# Patient Record
Sex: Female | Born: 1965 | Race: White | Hispanic: No | Smoking: Former smoker
Health system: Southern US, Community
[De-identification: ages and names within clinical notes are randomized; demographics above are authoritative.]

## PROBLEM LIST (undated history)

## (undated) DIAGNOSIS — B001 Herpesviral vesicular dermatitis: Secondary | ICD-10-CM

## (undated) DIAGNOSIS — Z973 Presence of spectacles and contact lenses: Secondary | ICD-10-CM

## (undated) DIAGNOSIS — R188 Other ascites: Secondary | ICD-10-CM

## (undated) DIAGNOSIS — K56609 Unspecified intestinal obstruction, unspecified as to partial versus complete obstruction: Secondary | ICD-10-CM

## (undated) DIAGNOSIS — M1990 Unspecified osteoarthritis, unspecified site: Secondary | ICD-10-CM

## (undated) DIAGNOSIS — R12 Heartburn: Secondary | ICD-10-CM

## (undated) DIAGNOSIS — T7840XA Allergy, unspecified, initial encounter: Secondary | ICD-10-CM

## (undated) DIAGNOSIS — R112 Nausea with vomiting, unspecified: Secondary | ICD-10-CM

## (undated) DIAGNOSIS — Z9889 Other specified postprocedural states: Secondary | ICD-10-CM

## (undated) DIAGNOSIS — R51 Headache: Secondary | ICD-10-CM

## (undated) DIAGNOSIS — R519 Headache, unspecified: Secondary | ICD-10-CM

## (undated) DIAGNOSIS — F419 Anxiety disorder, unspecified: Secondary | ICD-10-CM

## (undated) DIAGNOSIS — K219 Gastro-esophageal reflux disease without esophagitis: Secondary | ICD-10-CM

## (undated) DIAGNOSIS — IMO0002 Reserved for concepts with insufficient information to code with codable children: Secondary | ICD-10-CM

## (undated) HISTORY — PX: KNEE ARTHROSCOPY: SHX127

## (undated) HISTORY — PX: TENDON REPAIR: SHX5111

## (undated) HISTORY — PX: MEDIAL PARTIAL KNEE REPLACEMENT: SHX5965

## (undated) HISTORY — PX: FOOT SURGERY: SHX648

## (undated) HISTORY — PX: REPLACEMENT TOTAL KNEE: SUR1224

## (undated) HISTORY — PX: WISDOM TOOTH EXTRACTION: SHX21

---

## 1999-03-24 ENCOUNTER — Other Ambulatory Visit: Admission: RE | Admit: 1999-03-24 | Discharge: 1999-03-24 | Payer: Self-pay | Admitting: *Deleted

## 2000-04-19 ENCOUNTER — Other Ambulatory Visit: Admission: RE | Admit: 2000-04-19 | Discharge: 2000-04-19 | Payer: Self-pay | Admitting: *Deleted

## 2001-01-19 ENCOUNTER — Other Ambulatory Visit: Admission: RE | Admit: 2001-01-19 | Discharge: 2001-01-19 | Payer: Self-pay | Admitting: *Deleted

## 2001-07-06 ENCOUNTER — Ambulatory Visit (HOSPITAL_COMMUNITY): Admission: RE | Admit: 2001-07-06 | Discharge: 2001-07-06 | Payer: Self-pay | Admitting: *Deleted

## 2001-08-17 ENCOUNTER — Inpatient Hospital Stay (HOSPITAL_COMMUNITY): Admission: AD | Admit: 2001-08-17 | Discharge: 2001-08-19 | Payer: Self-pay | Admitting: *Deleted

## 2001-10-02 ENCOUNTER — Other Ambulatory Visit: Admission: RE | Admit: 2001-10-02 | Discharge: 2001-10-02 | Payer: Self-pay | Admitting: *Deleted

## 2002-10-12 ENCOUNTER — Other Ambulatory Visit: Admission: RE | Admit: 2002-10-12 | Discharge: 2002-10-12 | Payer: Self-pay | Admitting: *Deleted

## 2003-02-19 ENCOUNTER — Emergency Department (HOSPITAL_COMMUNITY): Admission: EM | Admit: 2003-02-19 | Discharge: 2003-02-19 | Payer: Self-pay | Admitting: Emergency Medicine

## 2003-02-19 ENCOUNTER — Encounter: Payer: Self-pay | Admitting: Emergency Medicine

## 2003-03-30 ENCOUNTER — Ambulatory Visit (HOSPITAL_COMMUNITY): Admission: RE | Admit: 2003-03-30 | Discharge: 2003-03-30 | Payer: Self-pay | Admitting: *Deleted

## 2003-03-30 ENCOUNTER — Encounter: Payer: Self-pay | Admitting: *Deleted

## 2003-04-03 ENCOUNTER — Ambulatory Visit (HOSPITAL_COMMUNITY): Admission: RE | Admit: 2003-04-03 | Discharge: 2003-04-03 | Payer: Self-pay | Admitting: Internal Medicine

## 2003-04-03 ENCOUNTER — Encounter: Payer: Self-pay | Admitting: Specialist

## 2003-10-18 ENCOUNTER — Other Ambulatory Visit: Admission: RE | Admit: 2003-10-18 | Discharge: 2003-10-18 | Payer: Self-pay | Admitting: *Deleted

## 2003-12-20 ENCOUNTER — Encounter (INDEPENDENT_AMBULATORY_CARE_PROVIDER_SITE_OTHER): Payer: Self-pay | Admitting: *Deleted

## 2003-12-20 ENCOUNTER — Ambulatory Visit (HOSPITAL_COMMUNITY): Admission: RE | Admit: 2003-12-20 | Discharge: 2003-12-20 | Payer: Self-pay | Admitting: *Deleted

## 2004-03-02 ENCOUNTER — Ambulatory Visit (HOSPITAL_COMMUNITY): Admission: RE | Admit: 2004-03-02 | Discharge: 2004-03-02 | Payer: Self-pay | Admitting: Family Medicine

## 2004-05-08 ENCOUNTER — Other Ambulatory Visit: Admission: RE | Admit: 2004-05-08 | Discharge: 2004-05-08 | Payer: Self-pay | Admitting: Obstetrics and Gynecology

## 2004-06-20 ENCOUNTER — Emergency Department (HOSPITAL_COMMUNITY): Admission: EM | Admit: 2004-06-20 | Discharge: 2004-06-20 | Payer: Self-pay | Admitting: *Deleted

## 2004-12-30 ENCOUNTER — Other Ambulatory Visit: Admission: RE | Admit: 2004-12-30 | Discharge: 2004-12-30 | Payer: Self-pay | Admitting: Obstetrics and Gynecology

## 2005-07-16 ENCOUNTER — Other Ambulatory Visit: Admission: RE | Admit: 2005-07-16 | Discharge: 2005-07-16 | Payer: Self-pay | Admitting: Obstetrics and Gynecology

## 2009-05-20 ENCOUNTER — Ambulatory Visit (HOSPITAL_COMMUNITY): Payer: Self-pay | Admitting: Licensed Clinical Social Worker

## 2009-05-28 ENCOUNTER — Ambulatory Visit (HOSPITAL_COMMUNITY): Payer: Self-pay | Admitting: Licensed Clinical Social Worker

## 2009-06-06 ENCOUNTER — Ambulatory Visit (HOSPITAL_COMMUNITY): Payer: Self-pay | Admitting: Licensed Clinical Social Worker

## 2009-06-13 ENCOUNTER — Ambulatory Visit (HOSPITAL_COMMUNITY): Payer: Self-pay | Admitting: Licensed Clinical Social Worker

## 2010-11-21 ENCOUNTER — Encounter: Payer: Self-pay | Admitting: Family Medicine

## 2011-03-19 NOTE — H&P (Signed)
NAME:  Molly Beck, Molly Beck                      ACCOUNT NO.:  [id number]   MEDICAL RECORD NO.:  [id number]                   PATIENT TYPE:  AMB   LOCATION:  SDC                                  FACILITY:  [facility]   PHYSICIAN:  Tracie Harrier, M.D.              DATE OF BIRTH:  [birth year]   DATE OF ADMISSION:  DATE OF DISCHARGE:                                HISTORY & PHYSICAL   HISTORY OF PRESENT ILLNESS:  Molly Beck is a 45 year old female gravida 2  para 2 (twin delivery x1).  She is admitted at this time to undergo LEEP  procedure.  The patient underwent colposcopy recently in the office which  was quite difficult just physically.  The patient had a Pap smear recently  showing high-grade SIL.  She underwent colposcopy which was difficult due to  redundant vaginal tissue and the large area of the cervix.  For this she  will be admitted to undergo LEEP procedure under anesthesia for comfort  reasons.   She is on oral contraception for birth control measures.   MEDICAL HISTORY:  None.   SURGICAL HISTORY:  Knee surgery x1.   OBSTETRICAL HISTORY:  Normal spontaneous vaginal delivery x2 at term; one  pregnancy was a twin gestation.   CURRENT MEDICATIONS:  1. Ortho-Novum 7/7/7.  2. Zoloft.  3. Nasonex p.r.n.  4. Clarinex p.r.n.  5. Prilosec p.r.n.   ALLERGIES:  CODEINE.   PHYSICAL EXAMINATION:  VITAL SIGNS:  Stable, blood pressure 114/80.  GENERAL:  She is a well-developed, well-nourished female in no acute  distress.  HEENT:  Within normal limits.  NECK:  Supple without adenopathy or thyromegaly.  HEART:  Regular rate and rhythm without murmur, gallop, or rub.  LUNGS:  Clear to auscultation.  BREAST:  Exam done recently in the office was normal; this is deferred upon  admission.  ABDOMEN:  Soft and benign without masses, tenderness, hernia, or  organomegaly.  EXTREMITIES AND NEUROLOGIC:  Grossly normal.  PELVIC:  Normal external female genitalia, vagina and cervix are  clear to  the naked eye.  Redundant vaginal tissue is noted laterally.  The cervix is  parous.  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  The pelvic exam is otherwise unremarkable.   ADMITTING DIAGNOSIS:  High-grade squamous intraepithelial lesion.   PLAN:  LEEP procedure under anesthesia.   DISCUSSION:  The risks and benefits of this surgery explained to the  patient.  The main reason I am doing this in the hospital is for patient  comfort reasons.  Also, the parous nature of the cervix and the extending  area of acetowhite epithelium on the ectocervix.  Also, retraction might be  necessary to visualize the cervix safely.  The risks and benefits as well as  need for this procedure reviewed with the patient.  Tracie Harrier, M.D.    REG/MEDQ  D:  12/19/2003  T:  12/19/2003  Job:  [id number]

## 2011-03-19 NOTE — Op Note (Signed)
NAME:  Molly Beck, BELLEVUE                      ACCOUNT NO.:  [id number]   MEDICAL RECORD NO.:  [id number]                   PATIENT TYPE:  AMB   LOCATION:  SDC                                  FACILITY:  [facility]   PHYSICIAN:  Tracie Harrier, M.D.              DATE OF BIRTH:  1966-10-10   DATE OF PROCEDURE:  12/20/2003  DATE OF DISCHARGE:                                 OPERATIVE REPORT   PREOPERATIVE DIAGNOSIS:  High-grade squamous intraepithelial lesion.   POSTOPERATIVE DIAGNOSIS:  High-grade squamous intraepithelial lesion.   PROCEDURE:  Loop electrosurgical excision procedure.   SURGEON:  Tracie Harrier, M.D.   ANESTHESIA:  General.   ESTIMATED BLOOD LOSS:  Less than 20 mL.   COMPLICATIONS:  None.   FINDINGS:  At time of LEEP procedure, ectocervical extension of the cervical  dysplasia was noted.  A LEEP procedure was performed, which was probably  noted with positive margins.  The ectocervix was thoroughly cauterized with  the bipolar cautery, however.   DESCRIPTION OF PROCEDURE:  The patient was taken to the operating room,  where a general anesthetic was administered.  The patient was placed on the  operating table in the dorsal lithotomy position, a speculum was placed, and  the anterior lip of the cervix was grasped with a single-tooth tenaculum.  A  shallow LEEP was then performed in the standard fashion.  This was not very  deep.  This was fragmented, however, because of the size of the cervix.  The  cervical dysplasia was noted to be extending out onto the ectocervix.  Therefore, I am certain that the LEEP margin will be positive.  The  ectocervix was then thoroughly cauterized with a ball cautery extensively.  This included the LEEP bed and the ectocervix extensively.  The LEEP bed and  cervix were then coated with Monsel's solution.  All vaginal instruments  were removed.  The patient was awakened and taken to the recovery room in  good condition.  There were no  perioperative complications.                                               Tracie Harrier, M.D.    REG/MEDQ  D:  12/20/2003  T:  12/20/2003  Job:  [id number]

## 2012-07-21 ENCOUNTER — Ambulatory Visit (HOSPITAL_COMMUNITY)
Admission: RE | Admit: 2012-07-21 | Discharge: 2012-07-21 | Disposition: A | Discharge status: Home / Self Care | Payer: PRIVATE HEALTH INSURANCE | Source: Ambulatory Visit | Attending: Physician Assistant | Admitting: Physician Assistant

## 2012-07-21 DIAGNOSIS — M79606 Pain in leg, unspecified: Secondary | ICD-10-CM

## 2012-07-21 DIAGNOSIS — M7989 Other specified soft tissue disorders: Secondary | ICD-10-CM

## 2012-07-21 DIAGNOSIS — M79609 Pain in unspecified limb: Secondary | ICD-10-CM

## 2012-07-21 NOTE — Progress Notes (Signed)
Right:  No evidence of DVT, superficial thrombosis, or Baker's cyst.  Left:  Negative for DVT in the common femoral vein.  

## 2015-09-12 ENCOUNTER — Emergency Department (HOSPITAL_BASED_OUTPATIENT_CLINIC_OR_DEPARTMENT_OTHER)
Admission: EM | Admit: 2015-09-12 | Discharge: 2015-09-12 | Disposition: A | Discharge status: Home / Self Care | Payer: PRIVATE HEALTH INSURANCE | Attending: Emergency Medicine | Admitting: Emergency Medicine

## 2015-09-12 ENCOUNTER — Encounter (HOSPITAL_BASED_OUTPATIENT_CLINIC_OR_DEPARTMENT_OTHER): Payer: Self-pay | Admitting: Emergency Medicine

## 2015-09-12 DIAGNOSIS — R109 Unspecified abdominal pain: Secondary | ICD-10-CM | POA: Insufficient documentation

## 2015-09-12 DIAGNOSIS — Z87891 Personal history of nicotine dependence: Secondary | ICD-10-CM | POA: Diagnosis not present

## 2015-09-12 DIAGNOSIS — R000 Tachycardia, unspecified: Secondary | ICD-10-CM | POA: Diagnosis not present

## 2015-09-12 DIAGNOSIS — R197 Diarrhea, unspecified: Secondary | ICD-10-CM | POA: Insufficient documentation

## 2015-09-12 LAB — URINE MICROSCOPIC-ADD ON

## 2015-09-12 LAB — COMPREHENSIVE METABOLIC PANEL
ALK PHOS: 62 U/L (ref 38–126)
ALT: 45 U/L (ref 14–54)
AST: 35 U/L (ref 15–41)
Albumin: 3.9 g/dL (ref 3.5–5.0)
Anion gap: 7 (ref 5–15)
BILIRUBIN TOTAL: 0.7 mg/dL (ref 0.3–1.2)
BUN: 18 mg/dL (ref 6–20)
CALCIUM: 9.4 mg/dL (ref 8.9–10.3)
CO2: 25 mmol/L (ref 22–32)
CREATININE: 0.45 mg/dL (ref 0.44–1.00)
Chloride: 106 mmol/L (ref 101–111)
GFR calc Af Amer: >60 mL/min (ref >60)
GFR calc non Af Amer: >60 mL/min (ref >60)
GLUCOSE: 112 mg/dL — AB (ref 65–99)
Potassium: 3.9 mmol/L (ref 3.5–5.1)
SODIUM: 138 mmol/L (ref 135–145)
Total Protein: 6.9 g/dL (ref 6.5–8.1)

## 2015-09-12 LAB — CBC
HCT: 38 % (ref 36.0–46.0)
Hemoglobin: 13 g/dL (ref 12.0–15.0)
MCH: 31.3 pg (ref 26.0–34.0)
MCHC: 34.2 g/dL (ref 30.0–36.0)
MCV: 91.6 fL (ref 78.0–100.0)
PLATELETS: 267 10*3/uL (ref 150–400)
RBC: 4.15 MIL/uL (ref 3.87–5.11)
RDW: 12 % (ref 11.5–15.5)
WBC: 9.7 10*3/uL (ref 4.0–10.5)

## 2015-09-12 LAB — URINALYSIS, ROUTINE W REFLEX MICROSCOPIC
BILIRUBIN URINE: NEGATIVE
GLUCOSE, UA: NEGATIVE mg/dL
KETONES UR: 40 mg/dL — AB
Leukocytes, UA: NEGATIVE
Nitrite: NEGATIVE
PROTEIN: NEGATIVE mg/dL
Specific Gravity, Urine: 1.018 (ref 1.005–1.030)
Urobilinogen, UA: 0.2 mg/dL (ref 0.0–1.0)
pH: 6.5 (ref 5.0–8.0)

## 2015-09-12 LAB — LIPASE, BLOOD: Lipase: 25 U/L (ref 11–51)

## 2015-09-12 MED ORDER — HYDROMORPHONE HCL 1 MG/ML IJ SOLN
0.5000 mg | Freq: Once | INTRAMUSCULAR | Status: AC
  Administered 2015-09-12: 0.5 mg via INTRAVENOUS
  Filled 2015-09-12: qty 1

## 2015-09-12 MED ORDER — MORPHINE SULFATE (PF) 4 MG/ML IV SOLN
4.0000 mg | Freq: Once | INTRAVENOUS | Status: AC
  Administered 2015-09-12: 4 mg via INTRAVENOUS
  Filled 2015-09-12: qty 1

## 2015-09-12 MED ORDER — DICYCLOMINE HCL 10 MG PO CAPS: 20.0000 mg | ORAL_CAPSULE | Freq: Four times a day (QID) | ORAL | Status: DC | PRN

## 2015-09-12 MED ORDER — DICYCLOMINE HCL 10 MG PO CAPS
10.0000 mg | ORAL_CAPSULE | Freq: Once | ORAL | Status: AC
  Administered 2015-09-12: 10 mg via ORAL
  Filled 2015-09-12: qty 1

## 2015-09-12 MED ORDER — SODIUM CHLORIDE 0.9 % IV BOLUS (SEPSIS)
1000.0000 mL | Freq: Once | INTRAVENOUS | Status: AC
  Administered 2015-09-12: 1000 mL via INTRAVENOUS

## 2015-09-12 NOTE — ED Notes (Signed)
Per [facility]  NOV 3th keflex 500mg , , OCt 8 amoxil 875mg 

## 2015-09-12 NOTE — ED Provider Notes (Signed)
CSN: [id number]     Arrival date & time 09/12/15  1556 History   First MD Initiated Contact with Patient 09/12/15 1617     Chief Complaint  Patient presents with  . Abdominal Cramping  . Diarrhea     (Consider location/radiation/quality/duration/timing/severity/associated sxs/prior Treatment) HPI   Pulse 109, temperature 98.3 F (36.8 C), temperature source Oral, resp. rate 18, height 5\' 4"  (1.626 m), weight 160 lb (72.576 kg), SpO2 100 %.  Delle ReiningCarol T Bisceglia is a 49 y.o. female complaining of acute onset of multiple episodes of diarrhea last night after patient ate a chicken taco salad from a local [employer]. She was the only one who ate this meal, no other family members are affected. Patient states that the diarrhea turned bloody this morning. She denies fever, chills, vomiting. She does report emesis. She states that before she has an episode of diarrhea but she has a left-sided abdominal cramping which she rates at 10 out of 10. No pain medication taken prior to arrival. Of note. Patient had right knee replacement one week ago. She was on an unknown antibiotic which she finished 5 days ago. Denies pain to knee. She was written a prescription for Dilaudid and 5 mg oxycodone but she hasn't been taking it.   Antibiotics that the patient was on was both Keflex and amoxicillin 875 mg.    History reviewed. No pertinent past medical history. Past Surgical History  Procedure Laterality Date  . Replacement total knee     History reviewed. No pertinent family history. Social History  Substance Use Topics  . Smoking status: Former Games developermoker  . Smokeless tobacco: None  . Alcohol Use: Yes   OB History    No data available     Review of Systems  10 systems reviewed and found to be negative, except as noted in the HPI.   Allergies  Review of patient's allergies indicates no known allergies.  Home Medications   Prior to Admission medications   Medication Sig Start Date  End Date Taking? Authorizing Provider  dicyclomine (BENTYL) 10 MG capsule Take 2 capsules (20 mg total) by mouth 4 (four) times daily as needed for spasms. 09/12/15   Torien Ramroop, [name]   BP 145/84 mmHg  Pulse 79  Temp(Src) 98.3 F (36.8 C) (Oral)  Resp 18  Ht 5\' 4"  (1.626 m)  Wt 160 lb (72.576 kg)  BMI 27.45 kg/m2  SpO2 100% Physical Exam  Constitutional: She is oriented to person, place, and time. She appears well-developed and well-nourished. No distress.  HENT:  Head: Normocephalic.  Mildly dry mucous membranes  Eyes: Conjunctivae and EOM are normal.  Cardiovascular: Regular rhythm.   Mild tachycardia  Pulmonary/Chest: Effort normal and breath sounds normal. No stridor. No respiratory distress. She has no wheezes. She has no rales. She exhibits no tenderness.  Abdominal: Soft. She exhibits no distension and no mass. There is no tenderness. There is no rebound and no guarding.  Musculoskeletal: Normal range of motion.  Dressing in place to right knee with no significant tenderness, swelling or erythema  Neurological: She is alert and oriented to person, place, and time.  Psychiatric: She has a normal mood and affect.  Nursing note and vitals reviewed.   ED Course  Procedures (including critical care time) Labs Review Labs Reviewed  COMPREHENSIVE METABOLIC PANEL - Abnormal; Notable for the following:    Glucose, Bld 112 (*)    All other components within normal limits  URINALYSIS, ROUTINE W REFLEX MICROSCOPIC (  NOT AT [facility]) - Abnormal; Notable for the following:    APPearance CLOUDY (*)    Hgb urine dipstick MODERATE (*)    Ketones, ur 40 (*)    All other components within normal limits  URINE MICROSCOPIC-ADD ON - Abnormal; Notable for the following:    Squamous Epithelial / LPF FEW (*)    Bacteria, UA MANY (*)    All other components within normal limits  C DIFFICILE QUICK SCREEN W PCR REFLEX  LIPASE, BLOOD  CBC  GI PATHOGEN PANEL BY PCR, STOOL    Imaging  Review No results found. I have personally reviewed and evaluated these images and lab results as part of my medical decision-making.   EKG Interpretation None      MDM   Final diagnoses:  Diarrhea, unspecified type    Filed Vitals:   09/12/15 1602 09/12/15 1802 09/12/15 1929  BP:  148/84 145/84  Pulse: 109 71 79  Temp: 98.3 F (36.8 C)    TempSrc: Oral    Resp: Height:  (1.626 m)    Weight: 160 lb (72.576 kg)    SpO2: 100% 100% 100%    Medications  sodium chloride 0.9 % bolus 1,000 mL (0 mLs Intravenous Stopped 09/12/15 1802)  morphine 4 MG/ML injection 4 mg (4 mg Intravenous Given 09/12/15 1635)  dicyclomine (BENTYL) capsule 10 mg (10 mg Oral Given 09/12/15 1635)  HYDROmorphone (DILAUDID) injection 0.5 mg (0.5 mg Intravenous Given 09/12/15 1810)    CRESENCIA ASMUS is 49 y.o. female presenting with multiple episodes of diarrhea after patient ate a chicken taco salad last night. Patient has diffuse crampy abdominal pain directly before she has a bowel movement. Abdominal exam is nonsurgical. Patient with normal vital signs.   Work reassuring with no leukocytosis or electrolyte abnormalities. Urinalysis with moderate amount of hemoglobin think this is likely just secondary to slight dehydration. Patient has no signs of urinary tract infection although there are many bacteria, no indication for treating asymptomatic bacteriuria.  Patient is able to produce a small amount of stool for [place] to send for testing. I've advised this patient that her primary care physician will have to follow-up the results of this testing. We have had an extensive discussion on aggressive hydration and return to ED precautions.  Evaluation does not show pathology that would require ongoing emergent intervention or inpatient treatment. Pt is hemodynamically stable and mentating appropriately. Discussed findings and plan with patient/guardian, who agrees with care plan. All questions  answered. Return precautions discussed and outpatient follow up given.   New Prescriptions   DICYCLOMINE (BENTYL) 10 MG CAPSULE    Take 2 capsules (20 mg total) by mouth 4 (four) times daily as needed for spasms.         Wynetta Emery, [name] 09/12/15 1945  Margarita Grizzle, MD 09/12/15 [phone]

## 2015-09-12 NOTE — ED Notes (Signed)
Pt given bed pan and specimen cup to take home  Will get stool sample and bring to er

## 2015-09-12 NOTE — Discharge Instructions (Signed)
Using take one 5 mg oxycodone every 4 hours for pain, if this is not enough you can take two 5 mg oxycodone 6 hours for pain.  Your primary care physician will have to follow-up the results of the stool testing. Please check in with them in the next 72 hours.  Do not hesitate to return to the emergency room for any new, worsening or concerning symptoms.   Diarrhea Diarrhea is frequent loose and watery bowel movements. It can cause you to feel weak and dehydrated. Dehydration can cause you to become tired and thirsty, have a dry mouth, and have decreased urination that often is dark yellow. Diarrhea is a sign of another problem, most often an infection that will not last long. In most cases, diarrhea typically lasts 2-3 days. However, it can last longer if it is a sign of something more serious. It is important to treat your diarrhea as directed by your caregiver to lessen or prevent future episodes of diarrhea. CAUSES  Some common causes include:  Gastrointestinal infections caused by viruses, bacteria, or parasites.  Food poisoning or food allergies.  Certain medicines, such as antibiotics, chemotherapy, and laxatives.  Artificial sweeteners and fructose.  Digestive disorders. HOME CARE INSTRUCTIONS  Ensure adequate fluid intake (hydration): Have 1 cup (8 oz) of fluid for each diarrhea episode. Avoid fluids that contain simple sugars or sports drinks, fruit juices, whole milk products, and sodas. Your urine should be clear or pale yellow if you are drinking enough fluids. Hydrate with an oral rehydration solution that you can purchase at pharmacies, retail stores, and online. You can prepare an oral rehydration solution at home by mixing the following ingredients together:   - tsp table salt.   tsp baking soda.   tsp salt substitute containing potassium chloride.  1  tablespoons sugar.  1 L (34 oz) of water.  Certain foods and beverages may increase the speed at which food moves  through the gastrointestinal (GI) tract. These foods and beverages should be avoided and include:  Caffeinated and alcoholic beverages.  High-fiber foods, such as raw fruits and vegetables, nuts, seeds, and whole grain breads and cereals.  Foods and beverages sweetened with sugar alcohols, such as xylitol, sorbitol, and mannitol.  Some foods may be well tolerated and may help thicken stool including:  Starchy foods, such as rice, toast, pasta, low-sugar cereal, oatmeal, grits, baked potatoes, crackers, and bagels.  Bananas.  Applesauce.  Add probiotic-rich foods to help increase healthy bacteria in the GI tract, such as yogurt and fermented milk products.  Wash your hands well after each diarrhea episode.  Only take over-the-counter or prescription medicines as directed by your caregiver.  Take a warm bath to relieve any burning or pain from frequent diarrhea episodes. SEEK IMMEDIATE MEDICAL CARE IF:   You are unable to keep fluids down.  You have persistent vomiting.  You have blood in your stool, or your stools are black and tarry.  You do not urinate in 6-8 hours, or there is only a small amount of very dark urine.  You have abdominal pain that increases or localizes.  You have weakness, dizziness, confusion, or light-headedness.  You have a severe headache.  Your diarrhea gets worse or does not get better.  You have a fever or persistent symptoms for more than 2-3 days.  You have a fever and your symptoms suddenly get worse. MAKE SURE YOU:   Understand these instructions.  Will watch your condition.  Will get help right  away if you are not doing well or get worse.   This information is not intended to replace advice given to you by your health care provider. Make sure you discuss any questions you have with your health care provider.   Document Released: 10/08/2002 Document Revised: 11/08/2014 Document Reviewed: 06/25/2012 Elsevier Interactive Patient  Education [employer]

## 2015-09-12 NOTE — ED Notes (Signed)
Pt in c/o abdominal cramping and some bright red diarrhea onset last night after dinner, thinks she's sick from the food. No emesis. A+O, interactive and in NAD.

## 2015-09-16 ENCOUNTER — Telehealth (HOSPITAL_COMMUNITY): Payer: Self-pay

## 2015-09-16 LAB — GI PATHOGEN PANEL BY PCR, STOOL
C difficile toxin A/B: NOT DETECTED
Campylobacter by PCR: NOT DETECTED
Cryptosporidium by PCR: NOT DETECTED
E COLI (ETEC) LT/ST: NOT DETECTED
E COLI (STEC): NOT DETECTED
E COLI 0157 BY PCR: NOT DETECTED
G LAMBLIA BY PCR: NOT DETECTED
Norovirus GI/GII: NOT DETECTED
Rotavirus A by PCR: NOT DETECTED
SHIGELLA BY PCR: NOT DETECTED
Salmonella by PCR: NOT DETECTED

## 2017-05-30 ENCOUNTER — Encounter (HOSPITAL_COMMUNITY): Payer: Self-pay | Admitting: *Deleted

## 2017-05-30 ENCOUNTER — Ambulatory Visit: Payer: Self-pay | Admitting: Orthopedic Surgery

## 2017-05-30 NOTE — Progress Notes (Signed)
Pt denies SOB, chest pain, and being under the care of a cardiologist. Pt denies having a stress test, echo and cardiac cath. Pt denies having an EKG and chest x ray within the last year. Pt denies recent labs. Pt made aware to stop taking  Aspirin, vitamins, fish oil and herbal medications. Do not take any NSAIDs ie: Ibuprofen, Advil, Naproxen (Aleve), Motrin, BC and Goody Powder or any medication containing Aspirin. Pt verbalized understanding of all pre-op instructions.  

## 2017-05-31 ENCOUNTER — Encounter (HOSPITAL_COMMUNITY)
Admission: RE | Disposition: A | Discharge status: Home / Self Care | Payer: Self-pay | Source: Ambulatory Visit | Attending: Orthopedic Surgery

## 2017-05-31 ENCOUNTER — Ambulatory Visit (HOSPITAL_COMMUNITY)
Admission: RE | Admit: 2017-05-31 | Discharge: 2017-05-31 | Disposition: A | Discharge status: Home / Self Care | Payer: PRIVATE HEALTH INSURANCE | Source: Ambulatory Visit | Attending: Orthopedic Surgery | Admitting: Orthopedic Surgery

## 2017-05-31 ENCOUNTER — Encounter (HOSPITAL_COMMUNITY): Payer: Self-pay | Admitting: *Deleted

## 2017-05-31 ENCOUNTER — Ambulatory Visit (HOSPITAL_COMMUNITY): Payer: PRIVATE HEALTH INSURANCE | Admitting: Anesthesiology

## 2017-05-31 DIAGNOSIS — Y92832 Beach as the place of occurrence of the external cause: Secondary | ICD-10-CM | POA: Diagnosis not present

## 2017-05-31 DIAGNOSIS — Z87891 Personal history of nicotine dependence: Secondary | ICD-10-CM | POA: Insufficient documentation

## 2017-05-31 DIAGNOSIS — Z79899 Other long term (current) drug therapy: Secondary | ICD-10-CM | POA: Diagnosis not present

## 2017-05-31 DIAGNOSIS — K219 Gastro-esophageal reflux disease without esophagitis: Secondary | ICD-10-CM | POA: Diagnosis not present

## 2017-05-31 DIAGNOSIS — F419 Anxiety disorder, unspecified: Secondary | ICD-10-CM | POA: Diagnosis not present

## 2017-05-31 DIAGNOSIS — S66196A Other injury of flexor muscle, fascia and tendon of right little finger at wrist and hand level, initial encounter: Secondary | ICD-10-CM | POA: Diagnosis not present

## 2017-05-31 DIAGNOSIS — S66126A Laceration of flexor muscle, fascia and tendon of right little finger at wrist and hand level, initial encounter: Secondary | ICD-10-CM | POA: Insufficient documentation

## 2017-05-31 DIAGNOSIS — W260XXA Contact with knife, initial encounter: Secondary | ICD-10-CM | POA: Insufficient documentation

## 2017-05-31 HISTORY — DX: Herpesviral vesicular dermatitis: B00.1

## 2017-05-31 HISTORY — DX: Anxiety disorder, unspecified: F41.9

## 2017-05-31 HISTORY — DX: Presence of spectacles and contact lenses: Z97.3

## 2017-05-31 HISTORY — DX: Allergy, unspecified, initial encounter: T78.40XA

## 2017-05-31 HISTORY — DX: Unspecified osteoarthritis, unspecified site: M19.90

## 2017-05-31 HISTORY — DX: Reserved for concepts with insufficient information to code with codable children: IMO0002

## 2017-05-31 HISTORY — PX: I & D EXTREMITY: SHX5045

## 2017-05-31 HISTORY — DX: Headache: R51

## 2017-05-31 HISTORY — DX: Gastro-esophageal reflux disease without esophagitis: K21.9

## 2017-05-31 HISTORY — DX: Headache, unspecified: R51.9

## 2017-05-31 HISTORY — DX: Heartburn: R12

## 2017-05-31 LAB — BASIC METABOLIC PANEL
ANION GAP: 12 (ref 5–15)
BUN: 15 mg/dL (ref 6–20)
CHLORIDE: 101 mmol/L (ref 101–111)
CO2: 22 mmol/L (ref 22–32)
CREATININE: 0.67 mg/dL (ref 0.44–1.00)
Calcium: 9 mg/dL (ref 8.9–10.3)
GFR calc non Af Amer: >60 mL/min (ref >60)
Glucose, Bld: 71 mg/dL (ref 65–99)
POTASSIUM: 4.6 mmol/L (ref 3.5–5.1)
SODIUM: 135 mmol/L (ref 135–145)

## 2017-05-31 LAB — I-STAT BETA HCG BLOOD, ED (NOT ORDERABLE)

## 2017-05-31 LAB — CBC
HCT: 41 % (ref 36.0–46.0)
HEMOGLOBIN: 14.1 g/dL (ref 12.0–15.0)
MCH: 31.8 pg (ref 26.0–34.0)
MCHC: 34.4 g/dL (ref 30.0–36.0)
MCV: 92.6 fL (ref 78.0–100.0)
Platelets: 251 10*3/uL (ref 150–400)
RBC: 4.43 MIL/uL (ref 3.87–5.11)
RDW: 13.2 % (ref 11.5–15.5)
WBC: 5.3 10*3/uL (ref 4.0–10.5)

## 2017-05-31 SURGERY — IRRIGATION AND DEBRIDEMENT EXTREMITY
Anesthesia: General | Laterality: Right

## 2017-05-31 MED ORDER — MIDAZOLAM HCL 5 MG/5ML IJ SOLN
INTRAMUSCULAR | Status: DC | PRN
  Administered 2017-05-31 (×2): 1 mg via INTRAVENOUS

## 2017-05-31 MED ORDER — OXYCODONE HCL 5 MG PO TABS
ORAL_TABLET | ORAL | Status: AC
  Filled 2017-05-31: qty 1

## 2017-05-31 MED ORDER — LIDOCAINE HCL (CARDIAC) 20 MG/ML IV SOLN
INTRAVENOUS | Status: DC | PRN
  Administered 2017-05-31: 60 mg via INTRAVENOUS

## 2017-05-31 MED ORDER — LIDOCAINE 2% (20 MG/ML) 5 ML SYRINGE
INTRAMUSCULAR | Status: AC
  Filled 2017-05-31: qty 5

## 2017-05-31 MED ORDER — PROPOFOL 10 MG/ML IV BOLUS
INTRAVENOUS | Status: AC
  Filled 2017-05-31: qty 20

## 2017-05-31 MED ORDER — DEXAMETHASONE SODIUM PHOSPHATE 10 MG/ML IJ SOLN
INTRAMUSCULAR | Status: AC
  Filled 2017-05-31: qty 1

## 2017-05-31 MED ORDER — DEXAMETHASONE SODIUM PHOSPHATE 10 MG/ML IJ SOLN
INTRAMUSCULAR | Status: DC | PRN
  Administered 2017-05-31: 10 mg via INTRAVENOUS

## 2017-05-31 MED ORDER — FENTANYL CITRATE (PF) 100 MCG/2ML IJ SOLN
INTRAMUSCULAR | Status: AC
  Filled 2017-05-31: qty 2

## 2017-05-31 MED ORDER — PROPOFOL 10 MG/ML IV BOLUS
INTRAVENOUS | Status: DC | PRN
  Administered 2017-05-31: 130 mg via INTRAVENOUS

## 2017-05-31 MED ORDER — ONDANSETRON HCL 4 MG/2ML IJ SOLN
INTRAMUSCULAR | Status: DC | PRN
  Administered 2017-05-31: 4 mg via INTRAVENOUS

## 2017-05-31 MED ORDER — LACTATED RINGERS IV SOLN
INTRAVENOUS | Status: DC | PRN
  Administered 2017-05-31: 20:00:00 via INTRAVENOUS

## 2017-05-31 MED ORDER — FENTANYL CITRATE (PF) 250 MCG/5ML IJ SOLN
INTRAMUSCULAR | Status: AC
  Filled 2017-05-31: qty 5

## 2017-05-31 MED ORDER — MIDAZOLAM HCL 2 MG/2ML IJ SOLN
INTRAMUSCULAR | Status: AC
  Filled 2017-05-31: qty 2

## 2017-05-31 MED ORDER — EPHEDRINE 5 MG/ML INJ
INTRAVENOUS | Status: AC
  Filled 2017-05-31: qty 10

## 2017-05-31 MED ORDER — BUPIVACAINE HCL (PF) 0.25 % IJ SOLN
INTRAMUSCULAR | Status: DC | PRN
Start: 2017-05-31 — End: 2017-05-31

## 2017-05-31 MED ORDER — FENTANYL CITRATE (PF) 100 MCG/2ML IJ SOLN
INTRAMUSCULAR | Status: DC | PRN
  Administered 2017-05-31 (×4): 25 ug via INTRAVENOUS
  Administered 2017-05-31: 50 ug via INTRAVENOUS
  Administered 2017-05-31 (×2): 25 ug via INTRAVENOUS

## 2017-05-31 MED ORDER — CEFAZOLIN SODIUM-DEXTROSE 2-4 GM/100ML-% IV SOLN
2.0000 g | INTRAVENOUS | Status: AC
  Administered 2017-05-31: 2 g via INTRAVENOUS
  Filled 2017-05-31: qty 100

## 2017-05-31 MED ORDER — OXYCODONE HCL 5 MG PO TABS
5.0000 mg | ORAL_TABLET | Freq: Once | ORAL | Status: AC | PRN
  Administered 2017-05-31: 5 mg via ORAL

## 2017-05-31 MED ORDER — EPHEDRINE SULFATE 50 MG/ML IJ SOLN
INTRAMUSCULAR | Status: DC | PRN
  Administered 2017-05-31: 5 mg via INTRAVENOUS

## 2017-05-31 MED ORDER — FENTANYL CITRATE (PF) 100 MCG/2ML IJ SOLN
25.0000 ug | INTRAMUSCULAR | Status: DC | PRN
  Administered 2017-05-31 (×2): 50 ug via INTRAVENOUS

## 2017-05-31 MED ORDER — BUPIVACAINE HCL (PF) 0.25 % IJ SOLN
INTRAMUSCULAR | Status: AC
  Filled 2017-05-31: qty 10

## 2017-05-31 MED ORDER — SODIUM CHLORIDE 0.9 % IR SOLN
Status: DC | PRN
  Administered 2017-05-31: 1000 mL

## 2017-05-31 MED ORDER — OXYCODONE HCL 5 MG/5ML PO SOLN: 5.0000 mg | Freq: Once | ORAL | Status: AC | PRN

## 2017-05-31 MED ORDER — ONDANSETRON HCL 4 MG/2ML IJ SOLN
INTRAMUSCULAR | Status: AC
  Filled 2017-05-31: qty 2

## 2017-05-31 SURGICAL SUPPLY — 47 items
BANDAGE ACE 3X5.8 VEL STRL LF (GAUZE/BANDAGES/DRESSINGS) ×2 IMPLANT
BANDAGE ACE 4X5 VEL STRL LF (GAUZE/BANDAGES/DRESSINGS) ×3 IMPLANT
BNDG CONFORM 2 STRL LF (GAUZE/BANDAGES/DRESSINGS) IMPLANT
BNDG ELASTIC 2X5.8 VLCR STR LF (GAUZE/BANDAGES/DRESSINGS) ×2 IMPLANT
BNDG GAUZE ELAST 4 BULKY (GAUZE/BANDAGES/DRESSINGS) ×5 IMPLANT
CORDS BIPOLAR (ELECTRODE) ×3 IMPLANT
CUFF TOURNIQUET SINGLE 18IN (TOURNIQUET CUFF) ×3 IMPLANT
CUFF TOURNIQUET SINGLE 24IN (TOURNIQUET CUFF) IMPLANT
DRSG ADAPTIC 3X8 NADH LF (GAUZE/BANDAGES/DRESSINGS) ×3 IMPLANT
DRSG EMULSION OIL 3X3 NADH (GAUZE/BANDAGES/DRESSINGS) ×2 IMPLANT
GAUZE SPONGE 4X4 12PLY STRL (GAUZE/BANDAGES/DRESSINGS) ×3 IMPLANT
GAUZE XEROFORM 1X8 LF (GAUZE/BANDAGES/DRESSINGS) ×3 IMPLANT
GLOVE BIOGEL M 8.0 STRL (GLOVE) ×3 IMPLANT
GLOVE SS BIOGEL STRL SZ 8 (GLOVE) ×1 IMPLANT
GLOVE SUPERSENSE BIOGEL SZ 8 (GLOVE) ×2
GOWN STRL REUS W/ TWL LRG LVL3 (GOWN DISPOSABLE) ×1 IMPLANT
GOWN STRL REUS W/ TWL XL LVL3 (GOWN DISPOSABLE) ×2 IMPLANT
GOWN STRL REUS W/TWL LRG LVL3 (GOWN DISPOSABLE) ×3
GOWN STRL REUS W/TWL XL LVL3 (GOWN DISPOSABLE) ×6
HANDPIECE INTERPULSE COAX TIP (DISPOSABLE)
KIT BASIN OR (CUSTOM PROCEDURE TRAY) ×3 IMPLANT
KIT ROOM TURNOVER OR (KITS) ×3 IMPLANT
MANIFOLD NEPTUNE II (INSTRUMENTS) ×3 IMPLANT
NDL HYPO 25GX1X1/2 BEV (NEEDLE) IMPLANT
NEEDLE HYPO 25GX1X1/2 BEV (NEEDLE) ×3 IMPLANT
NS IRRIG 1000ML POUR BTL (IV SOLUTION) ×3 IMPLANT
PACK ORTHO EXTREMITY (CUSTOM PROCEDURE TRAY) ×3 IMPLANT
PAD ARMBOARD 7.5X6 YLW CONV (MISCELLANEOUS) ×3 IMPLANT
PAD CAST 4YDX4 CTTN HI CHSV (CAST SUPPLIES) ×1 IMPLANT
PADDING CAST COTTON 4X4 STRL (CAST SUPPLIES) ×3
PASSER SUT SWANSON 36MM LOOP (INSTRUMENTS) ×2 IMPLANT
SCRUB BETADINE 4OZ XXX (MISCELLANEOUS) ×3 IMPLANT
SET HNDPC FAN SPRY TIP SCT (DISPOSABLE) IMPLANT
SOL PREP POV-IOD 4OZ 10% (MISCELLANEOUS) ×3 IMPLANT
SPLINT FIBERGLASS 3X12 (CAST SUPPLIES) ×2 IMPLANT
SPONGE LAP 4X18 X RAY DECT (DISPOSABLE) ×3 IMPLANT
STOCKINETTE TUBULAR SYNTH 4IN (CAST SUPPLIES) ×2 IMPLANT
SUT PROLENE 2 0 FS (SUTURE) ×4 IMPLANT
SUT PROLENE 4 0 PS 2 18 (SUTURE) ×2 IMPLANT
SWAB CULTURE ESWAB REG 1ML (MISCELLANEOUS) IMPLANT
SYR CONTROL 10ML LL (SYRINGE) IMPLANT
TOWEL OR 17X24 6PK STRL BLUE (TOWEL DISPOSABLE) ×3 IMPLANT
TOWEL OR 17X26 10 PK STRL BLUE (TOWEL DISPOSABLE) ×3 IMPLANT
TUBE CONNECTING 12'X1/4 (SUCTIONS) ×1
TUBE CONNECTING 12X1/4 (SUCTIONS) ×2 IMPLANT
WATER STERILE IRR 1000ML POUR (IV SOLUTION) ×3 IMPLANT
YANKAUER SUCT BULB TIP NO VENT (SUCTIONS) ×3 IMPLANT

## 2017-05-31 NOTE — Op Note (Signed)
See dictation#[id number] SP zone 2 FDP repair and FDS tenolysis right small finger Lynde Ludwig MD

## 2017-05-31 NOTE — Anesthesia Postprocedure Evaluation (Signed)
Anesthesia Post Note  Patient: Delle ReiningCarol T Whitesel  Procedure(s) Performed: Procedure(s) (LRB): Right small finger irrigation and debridement and flexor tendon reconstruction (Right)     Patient location during evaluation: PACU Anesthesia Type: General Level of consciousness: awake and alert Pain management: pain level controlled Vital Signs Assessment: post-procedure vital signs reviewed and stable Respiratory status: spontaneous breathing, nonlabored ventilation, respiratory function stable and patient connected to nasal cannula oxygen Cardiovascular status: stable Postop Assessment: no signs of nausea or vomiting Anesthetic complications: no    Last Vitals:  Vitals:   05/31/17 2205 05/31/17 2215  BP: (!) 135/93 (!) 138/96  Pulse: 84 86  Resp: (!) 9 13  Temp:  36.7 C    Last Pain:  Vitals:   05/31/17 2215  TempSrc:   PainSc: Asleep                 Chia Rock

## 2017-05-31 NOTE — Anesthesia Procedure Notes (Signed)
Procedure Name: LMA Insertion Date/Time: 05/31/2017 8:14 PM Performed by: Edmonia CaprioAUSTON, Asmara Backs M Pre-anesthesia Checklist: Patient identified, Emergency Drugs available, Suction available, Patient being monitored and Timeout performed Patient Re-evaluated:Patient Re-evaluated prior to induction Oxygen Delivery Method: Circle system utilized Preoxygenation: Pre-oxygenation with 100% oxygen Induction Type: IV induction Ventilation: Mask ventilation without difficulty LMA: LMA inserted LMA Size: 4.0 Tube type: Oral Number of attempts: 1 Placement Confirmation: positive ETCO2 and breath sounds checked- equal and bilateral Tube secured with: Tape Dental Injury: Teeth and Oropharynx as per pre-operative assessment

## 2017-05-31 NOTE — Anesthesia Preprocedure Evaluation (Signed)
Anesthesia Evaluation  Patient identified by MRN, date of birth, ID band Patient awake    Reviewed: Allergy & Precautions, NPO status , Patient's Chart, lab work & pertinent test results  History of Anesthesia Complications Negative for: history of anesthetic complications  Airway Mallampati: I  TM Distance: >3 FB Neck ROM: Full    Dental  (+) Teeth Intact   Pulmonary neg shortness of breath, neg sleep apnea, neg COPD, neg recent URI, former smoker,    breath sounds clear to auscultation       Cardiovascular negative cardio ROS   Rhythm:Regular     Neuro/Psych  Headaches, neg Seizures Anxiety    GI/Hepatic Neg liver ROS, GERD  Medicated and Controlled,  Endo/Other    Renal/GU negative Renal ROS     Musculoskeletal  (+) Arthritis ,   Abdominal   Peds  Hematology   Anesthesia Other Findings   Reproductive/Obstetrics                             Anesthesia Physical Anesthesia Plan  ASA: II  Anesthesia Plan: General   Post-op Pain Management:    Induction: Intravenous  PONV Risk Score and Plan: 3 and Ondansetron and Dexamethasone  Airway Management Planned: LMA  Additional Equipment: None  Intra-op Plan:   Post-operative Plan: Extubation in OR  Informed Consent: I have reviewed the patients History and Physical, chart, labs and discussed the procedure including the risks, benefits and alternatives for the proposed anesthesia with the patient or authorized representative who has indicated his/her understanding and acceptance.   Dental advisory given  Plan Discussed with: CRNA and Surgeon  Anesthesia Plan Comments:         Anesthesia Quick Evaluation

## 2017-05-31 NOTE — Transfer of Care (Signed)
Immediate Anesthesia Transfer of Care Note  Patient: Molly ReiningCarol T Beck  Procedure(s) Performed: Procedure(s): Right small finger irrigation and debridement and flexor tendon reconstruction (Right)  Patient Location: PACU  Anesthesia Type:General  Level of Consciousness: awake, alert  and oriented  Airway & Oxygen Therapy: Patient Spontanous Breathing and Patient connected to nasal cannula oxygen  Post-op Assessment: Report given to RN and Post -op Vital signs reviewed and stable  Post vital signs: Reviewed and stable  Last Vitals:  Vitals:   05/31/17 1617 05/31/17 2139  BP:  (!) 143/92  Pulse: 88 96  Resp: 18 16  Temp: 36.7 C (!) 36.4 C    Last Pain:  Vitals:   05/31/17 2139  TempSrc:   PainSc: 5          Complications: No apparent anesthesia complications

## 2017-05-31 NOTE — Discharge Instructions (Signed)
Please take the medicine Dr Amanda PeaGramig Rx and call for any problems  DO NOT MOVE your finger  We recommend that you to take vitamin C 1000 mg a day to promote healing. We also recommend that if you require  pain medicine that you take a stool softener to prevent constipation as most pain medicines will have constipation side effects. We recommend either Peri-Colace or Senokot and recommend that you also consider adding MiraLAX as well to prevent the constipation affects from pain medicine if you are required to use them. These medicines are over the counter and may be purchased at a local pharmacy. A cup of yogurt and a probiotic can also be helpful during the recovery process as the medicines can disrupt your intestinal environment. Keep bandage clean and dry.  Call for any problems.  No smoking.  Criteria for driving a car: you should be off your pain medicine for 7-8 hours, able to drive one handed(confident), thinking clearly and feeling able in your judgement to drive. Continue elevation as it will decrease swelling.  If instructed by MD move your fingers within the confines of the bandage/splint.  Use ice if instructed by your MD. Call immediately for any sudden loss of feeling in your hand/arm or change in functional abilities of the extremity.

## 2017-05-31 NOTE — H&P (Signed)
Molly Beck is an 51 y.o. female.   Chief Complaint: right small finger tendon injury HPI: Patient presents for evaluation and treatment of the of their upper extremity predicament. The patient denies neck, back, chest or  abdominal pain. The patient notes that they have no lower extremity problems. The patients primary complaint is noted. We are planning surgical care pathway for the upper extremity.  Past Medical History:  Diagnosis Date  . Allergy   . Anxiety   . GERD (gastroesophageal reflux disease)   . Headache    migraines  . Heartburn   . OA (osteoarthritis)    T/O  . Recurrent cold sores   . Tendon laceration    right  small finger  . Wears glasses     Past Surgical History:  Procedure Laterality Date  . KNEE ARTHROSCOPY     B/L  . REPLACEMENT TOTAL KNEE     B/L  . TENDON REPAIR     left thumb  . WISDOM TOOTH EXTRACTION      Family History  Problem Relation Age of Onset  . Macular degeneration Mother   . Pancreatic cancer Father   . Other Sister    Social History:  reports that she has quit smoking. She has never used smokeless tobacco. She reports that she drinks alcohol. She reports that she does not use drugs.  Allergies:  Allergies  Allergen Reactions  . Codeine Nausea Only    Medications Prior to Admission  Medication Sig Dispense Refill  . acetaminophen (TYLENOL) 325 MG tablet Take 650 mg by mouth every 6 (six) hours as needed (for back pain/pain.).    Marland Kitchen. ALPRAZolam (XANAX) 0.25 MG tablet Take 0.25 mg by mouth 2 (two) times daily as needed for anxiety.    . cetirizine (ZYRTEC) 10 MG tablet Take 10 mg by mouth daily.    . fluticasone (FLONASE) 50 MCG/ACT nasal spray Place 1 spray into both nostrils daily.    . Homeopathic Products [facility](SIMILASAN ALLERGY EYE RELIEF OP) Apply to eye.    Marland Kitchen. ibuprofen (ADVIL,MOTRIN) 200 MG tablet Take 600 mg by mouth 2 (two) times daily as needed (for pain.).    Marland Kitchen. LACTASE PO Take 5 mg by mouth daily as needed (for milk  consumption).    . Menthol, Topical Analgesic, (BENGAY EX) Apply 1 application topically 2 (two) times daily as needed (for lower back pain.).    Marland Kitchen. montelukast (SINGULAIR) 10 MG tablet Take 10 mg by mouth daily at 2 PM.    . ranitidine (ZANTAC) 150 MG tablet Take 150 mg by mouth 2 (two) times daily as needed for heartburn.    . traMADol (ULTRAM) 50 MG tablet Take 25-50 mg by mouth 3 (three) times daily as needed (for back pain.).    Marland Kitchen. valACYclovir (VALTREX) 500 MG tablet Take 500 mg by mouth 2 (two) times daily as needed. For cold sores    . eletriptan (RELPAX) 40 MG tablet Take 40 mg by mouth as needed for migraine or headache. May repeat in 2 hours if headache persists or recurs.      Results for orders placed or performed during the hospital encounter of 05/31/17 (from the past 48 hour(s))  CBC     Status: None   Collection Time: 05/31/17  4:25 PM  Result Value Ref Range   WBC 5.3 4.0 - 10.5 K/uL   RBC 4.43 3.87 - 5.11 MIL/uL   Hemoglobin 14.1 12.0 - 15.0 g/dL   HCT 45.441.0 09.836.0 -  46.0 %   MCV 92.6 78.0 - 100.0 fL   MCH 31.8 26.0 - 34.0 pg   MCHC 34.4 30.0 - 36.0 g/dL   RDW 40.913.2 81.111.5 - 91.415.5 %   Platelets 251 150 - 400 K/uL  I-Stat beta hCG blood, ED     Status: None   Collection Time: 05/31/17  4:52 PM  Result Value Ref Range   I-stat hCG, quantitative <5.0 <5 mIU/mL   Comment 3            Comment:   GEST. AGE      CONC.  (mIU/mL)   <=1 WEEK        5 - 50     2 WEEKS       50 - 500     3 WEEKS       100 - 10,000     4 WEEKS     1,000 - 30,000        FEMALE AND NON-PREGNANT FEMALE:     LESS THAN 5 mIU/mL    No results found.  Review of Systems  Respiratory: Negative.   Cardiovascular: Negative.   Gastrointestinal: Negative.   Genitourinary: Negative.     Pulse 88, temperature 98 F (36.7 C), temperature source Oral, resp. rate 18, height 5' 4.75" (1.645 m), weight 79.4 kg (175 lb), SpO2 100 %. Physical Exam right small finger tendon injury with loss of the FDP The  patient is alert and oriented in no acute distress. The patient complains of pain in the affected upper extremity.  The patient is noted to have a normal HEENT exam. Lung fields show equal chest expansion and no shortness of breath. Abdomen exam is nontender without distention. Lower extremity examination does not show any fracture dislocation or blood clot symptoms. Pelvis is stable and the neck and back are stable and nontender.  Assessment/Plan Plan for eval and exploration and reconstruction right small finger We are planning surgery for your upper extremity. The risk and benefits of surgery to include risk of bleeding, infection, anesthesia,  damage to normal structures and failure of the surgery to accomplish its intended goals of relieving symptoms and restoring function have been discussed in detail. With this in mind we plan to proceed. I have specifically discussed with the patient the pre-and postoperative regime and the dos and don'ts and risk and benefits in great detail. Risk and benefits of surgery also include risk of dystrophy(CRPS), chronic nerve pain, failure of the healing process to go onto completion and other inherent risks of surgery The relavent the pathophysiology of the disease/injury process, as well as the alternatives for treatment and postoperative course of action has been discussed in great detail with the patient who desires to proceed.  We will do everything in our power to help you (the patient) restore function to the upper extremity. It is a pleasure to see this patient today.   Karen ChafeGRAMIG III,Virgilia Quigg M, MD 05/31/2017, 8:03 PM

## 2017-06-01 ENCOUNTER — Encounter (HOSPITAL_COMMUNITY): Payer: Self-pay | Admitting: Orthopedic Surgery

## 2017-06-01 NOTE — Op Note (Signed)
NAME:  Molly Beck, Molly Beck                 ACCOUNT NO.:  MEDICAL RECORD NO.:  [id number]  LOCATION:                                 FACILITY:  PHYSICIAN:  Molly AnoWilliam M. Seeley Hissong, M.D.     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  POSTOPERATIVE DIAGNOSIS:  Laceration, zone 2, no man's land, FDP tendon (flexor digitorum profundus).  Flexor digitorum superficialis injury, but intact majority of the tendon.  SURGICAL PROCEDURES PERFORMED: 1. Irrigation and debridement of skin, subcutaneous tissue, periosteal     tissue, tendon and associated soft tissue structures, right small     finger. 2. Zone 2 FDP (flexor digitorum profundus) repair, right small finger     with a 4-strand modified Kessler-to-Jimmy stitch utilizing 3-0     FiberWire. 3. Extensive FDS (flexor digitorum superficialis tendon) tenolysis,     right small finger.  SURGEON:  Molly AnoWilliam M. Amanda PeaGramig, M.D.  ASSISTANT:  Karie ChimeraBrian Buchanan, P.A.-C.  COMPLICATIONS:  None.  ANESTHESIA:  General.  TOURNIQUET TIME:  Less than an hour.  INDICATIONS:  This is a 51 year old female who presents after [place] butter knife type [place].  She states she thought that she could move her finger at that time.  She then had an injury with her dog and noted that she could not move her finger.  MRI was inconclusive in regard to FDP rupture at the distal region versus laceration.  Given these issues, I recommended exploration and repair as necessary.  She desires and consents to the above-mentioned procedure. I have counseled her extensively in regard to FDP ruptures and lacerations and our passive flexion and active extension protocol.  OPERATION IN DETAIL:  The  patient was seen by myself and Anesthesia, taken to the operative theater, underwent smooth induction of general anesthetic, prepped and draped in usual sterile fashion with Betadine scrub followed by sterile field being secured.  She did undergo a Hibiclens pre-scrub, preoperative antibiotics were given.  Sterile field was secured and modified Brunner incision was made and 250 mmHg tourniquet controlled.  Skin flaps elevated.  Neurovascular bundle was carefully protected.  The patient had an obvious zone 2 laceration in no man's land.  FDP was retracted well into the palm.  Thus counterincision was made and I retrieved it.  The FDS underwent extensive tenolysis, tenosynovectomy.  I very carefully and cautiously threaded the FDP to the Camper of chiasm and utilizing suture shuttles and very careful technique, advanced the tendon.  The tendon was lacerated at about the A4 region and there was a significant stump.  I felt that a pull-out technique would be ill-advised as it would shorten her and risk a quadriga effect.  Thus, I performed direct primary repair.  The tendons underwent a threading through the pulleys and following this, a 4-strand modified Kessler-to-Jimmy 3-0 FiberWire stitch was used.  This looked excellent.  I then performed retraction check and all looked well.  I  irrigated copiously, noted the FDS was stable.  The Camper of chiasm looked excellent.  We deflated the tourniquet, closed the wound with Prolene.  The patient tolerated this well.  She was placed in our standard flexor tendon splint and I will go ahead and continue passive flexion and active extension for 6 weeks.  I will not move her at 4 weeks in terms of active motion, but we will delay her till 6 given the timeframe duration from injury to presentation and the extensive retraction that the tendon underwent.  She did not appear to have quadriga effect or myostatic contracture, but I want to keep a close  eye on her.  The wound was closed nicely.  She was discharged on Bactrim DS 1 p.o. b.i.d. and OxyIR p.r.n. pain.  Should any problems occur, she will notify us.  Do's and don'ts have been discussed.  All questions have been encouraged and answered.  It was a pleasure to see her and participate in her care plan.     Molly AnoWilliam M. Amanda PeaGramig, M.D.     [facility]leWMG/MEDQ  D:  05/31/2017  T:  06/01/2017  Job:  [id number]

## 2018-01-11 DIAGNOSIS — G8929 Other chronic pain: Secondary | ICD-10-CM | POA: Insufficient documentation

## 2018-07-07 DIAGNOSIS — M545 Low back pain, unspecified: Secondary | ICD-10-CM | POA: Insufficient documentation

## 2019-04-04 ENCOUNTER — Inpatient Hospital Stay (HOSPITAL_COMMUNITY)
Admission: EM | Admit: 2019-04-04 | Discharge: 2019-05-02 | DRG: 854 | Disposition: A | Discharge status: Home / Self Care | Payer: PRIVATE HEALTH INSURANCE | Attending: General Surgery | Admitting: General Surgery

## 2019-04-04 ENCOUNTER — Encounter (HOSPITAL_COMMUNITY): Payer: Self-pay

## 2019-04-04 ENCOUNTER — Emergency Department (HOSPITAL_COMMUNITY): Payer: PRIVATE HEALTH INSURANCE

## 2019-04-04 ENCOUNTER — Other Ambulatory Visit: Payer: Self-pay

## 2019-04-04 DIAGNOSIS — F419 Anxiety disorder, unspecified: Secondary | ICD-10-CM | POA: Diagnosis present

## 2019-04-04 DIAGNOSIS — Y92239 Unspecified place in hospital as the place of occurrence of the external cause: Secondary | ICD-10-CM | POA: Diagnosis not present

## 2019-04-04 DIAGNOSIS — R188 Other ascites: Secondary | ICD-10-CM

## 2019-04-04 DIAGNOSIS — Y846 Urinary catheterization as the cause of abnormal reaction of the patient, or of later complication, without mention of misadventure at the time of the procedure: Secondary | ICD-10-CM | POA: Diagnosis not present

## 2019-04-04 DIAGNOSIS — R21 Rash and other nonspecific skin eruption: Secondary | ICD-10-CM | POA: Diagnosis not present

## 2019-04-04 DIAGNOSIS — Z87891 Personal history of nicotine dependence: Secondary | ICD-10-CM

## 2019-04-04 DIAGNOSIS — T83098A Other mechanical complication of other indwelling urethral catheter, initial encounter: Secondary | ICD-10-CM | POA: Diagnosis not present

## 2019-04-04 DIAGNOSIS — Z9889 Other specified postprocedural states: Secondary | ICD-10-CM

## 2019-04-04 DIAGNOSIS — K567 Ileus, unspecified: Secondary | ICD-10-CM | POA: Diagnosis not present

## 2019-04-04 DIAGNOSIS — T85698A Other mechanical complication of other specified internal prosthetic devices, implants and grafts, initial encounter: Secondary | ICD-10-CM | POA: Diagnosis not present

## 2019-04-04 DIAGNOSIS — D62 Acute posthemorrhagic anemia: Secondary | ICD-10-CM | POA: Diagnosis not present

## 2019-04-04 DIAGNOSIS — Z6830 Body mass index (BMI) 30.0-30.9, adult: Secondary | ICD-10-CM

## 2019-04-04 DIAGNOSIS — N390 Urinary tract infection, site not specified: Secondary | ICD-10-CM | POA: Diagnosis not present

## 2019-04-04 DIAGNOSIS — M1990 Unspecified osteoarthritis, unspecified site: Secondary | ICD-10-CM | POA: Diagnosis present

## 2019-04-04 DIAGNOSIS — K5720 Diverticulitis of large intestine with perforation and abscess without bleeding: Secondary | ICD-10-CM | POA: Diagnosis present

## 2019-04-04 DIAGNOSIS — Z0189 Encounter for other specified special examinations: Secondary | ICD-10-CM

## 2019-04-04 DIAGNOSIS — K56609 Unspecified intestinal obstruction, unspecified as to partial versus complete obstruction: Secondary | ICD-10-CM

## 2019-04-04 DIAGNOSIS — K529 Noninfective gastroenteritis and colitis, unspecified: Secondary | ICD-10-CM | POA: Diagnosis present

## 2019-04-04 DIAGNOSIS — A419 Sepsis, unspecified organism: Principal | ICD-10-CM | POA: Diagnosis present

## 2019-04-04 DIAGNOSIS — Z20828 Contact with and (suspected) exposure to other viral communicable diseases: Secondary | ICD-10-CM | POA: Diagnosis present

## 2019-04-04 DIAGNOSIS — E46 Unspecified protein-calorie malnutrition: Secondary | ICD-10-CM | POA: Diagnosis not present

## 2019-04-04 DIAGNOSIS — K219 Gastro-esophageal reflux disease without esophagitis: Secondary | ICD-10-CM | POA: Diagnosis present

## 2019-04-04 DIAGNOSIS — K631 Perforation of intestine (nontraumatic): Secondary | ICD-10-CM

## 2019-04-04 DIAGNOSIS — E870 Hyperosmolality and hypernatremia: Secondary | ICD-10-CM | POA: Diagnosis not present

## 2019-04-04 DIAGNOSIS — Y848 Other medical procedures as the cause of abnormal reaction of the patient, or of later complication, without mention of misadventure at the time of the procedure: Secondary | ICD-10-CM | POA: Diagnosis not present

## 2019-04-04 DIAGNOSIS — T8141XA Infection following a procedure, superficial incisional surgical site, initial encounter: Secondary | ICD-10-CM | POA: Diagnosis not present

## 2019-04-04 DIAGNOSIS — E876 Hypokalemia: Secondary | ICD-10-CM

## 2019-04-04 DIAGNOSIS — E669 Obesity, unspecified: Secondary | ICD-10-CM | POA: Diagnosis present

## 2019-04-04 DIAGNOSIS — R739 Hyperglycemia, unspecified: Secondary | ICD-10-CM | POA: Diagnosis present

## 2019-04-04 LAB — CBC WITH DIFFERENTIAL/PLATELET
Abs Immature Granulocytes: 0.05 10*3/uL (ref 0.00–0.07)
Basophils Absolute: 0 10*3/uL (ref 0.0–0.1)
Basophils Relative: 0 %
Eosinophils Absolute: 0 10*3/uL (ref 0.0–0.5)
Eosinophils Relative: 0 %
HCT: 40.7 % (ref 36.0–46.0)
Hemoglobin: 13.7 g/dL (ref 12.0–15.0)
Immature Granulocytes: 0 %
Lymphocytes Relative: 3 %
Lymphs Abs: 0.4 10*3/uL — ABNORMAL LOW (ref 0.7–4.0)
MCH: 30.7 pg (ref 26.0–34.0)
MCHC: 33.7 g/dL (ref 30.0–36.0)
MCV: 91.3 fL (ref 80.0–100.0)
Monocytes Absolute: 1.3 10*3/uL — ABNORMAL HIGH (ref 0.1–1.0)
Monocytes Relative: 9 %
Neutro Abs: 12.3 10*3/uL — ABNORMAL HIGH (ref 1.7–7.7)
Neutrophils Relative %: 88 %
Platelets: 380 10*3/uL (ref 150–400)
RBC: 4.46 MIL/uL (ref 3.87–5.11)
RDW: 12.6 % (ref 11.5–15.5)
WBC: 14 10*3/uL — ABNORMAL HIGH (ref 4.0–10.5)
nRBC: 0 % (ref 0.0–0.2)

## 2019-04-04 LAB — COMPREHENSIVE METABOLIC PANEL
ALT: 18 U/L (ref 0–44)
AST: 20 U/L (ref 15–41)
Albumin: 4.2 g/dL (ref 3.5–5.0)
Alkaline Phosphatase: 101 U/L (ref 38–126)
Anion gap: 13 (ref 5–15)
BUN: 17 mg/dL (ref 6–20)
CO2: 24 mmol/L (ref 22–32)
Calcium: 9.1 mg/dL (ref 8.9–10.3)
Chloride: 103 mmol/L (ref 98–111)
Creatinine, Ser: 0.67 mg/dL (ref 0.44–1.00)
GFR calc Af Amer: >60 mL/min (ref >60)
GFR calc non Af Amer: >60 mL/min (ref >60)
Glucose, Bld: 149 mg/dL — ABNORMAL HIGH (ref 70–99)
Potassium: 3.2 mmol/L — ABNORMAL LOW (ref 3.5–5.1)
Sodium: 140 mmol/L (ref 135–145)
Total Bilirubin: 0.8 mg/dL (ref 0.3–1.2)
Total Protein: 7.7 g/dL (ref 6.5–8.1)

## 2019-04-04 LAB — I-STAT BETA HCG BLOOD, ED (MC, WL, AP ONLY): I-stat hCG, quantitative: <5 m[IU]/mL (ref <5)

## 2019-04-04 LAB — MAGNESIUM: Magnesium: 2.7 mg/dL — ABNORMAL HIGH (ref 1.7–2.4)

## 2019-04-04 MED ORDER — MORPHINE SULFATE (PF) 2 MG/ML IV SOLN
2.0000 mg | Freq: Once | INTRAVENOUS | Status: AC
  Administered 2019-04-04: 2 mg via INTRAVENOUS
  Filled 2019-04-04: qty 1

## 2019-04-04 MED ORDER — SODIUM CHLORIDE 0.9 % IV BOLUS
1000.0000 mL | Freq: Once | INTRAVENOUS | Status: AC
  Administered 2019-04-04: 1000 mL via INTRAVENOUS

## 2019-04-04 MED ORDER — ONDANSETRON HCL 4 MG/2ML IJ SOLN
4.0000 mg | Freq: Once | INTRAMUSCULAR | Status: AC
  Administered 2019-04-04: 4 mg via INTRAVENOUS
  Filled 2019-04-04: qty 2

## 2019-04-04 MED ORDER — FENTANYL CITRATE (PF) 100 MCG/2ML IJ SOLN
50.0000 ug | INTRAMUSCULAR | Status: DC | PRN
  Filled 2019-04-04: qty 2

## 2019-04-04 MED ORDER — IOHEXOL 300 MG/ML  SOLN
100.0000 mL | Freq: Once | INTRAMUSCULAR | Status: AC | PRN
  Administered 2019-04-04: 100 mL via INTRAVENOUS

## 2019-04-04 MED ORDER — FENTANYL CITRATE (PF) 100 MCG/2ML IJ SOLN
75.0000 ug | INTRAMUSCULAR | Status: DC | PRN
  Administered 2019-04-04: 75 ug via INTRAVENOUS

## 2019-04-04 MED ORDER — PIPERACILLIN-TAZOBACTAM 3.375 G IVPB 30 MIN
3.3750 g | Freq: Once | INTRAVENOUS | Status: AC
  Administered 2019-04-05: 3.375 g via INTRAVENOUS
  Filled 2019-04-04: qty 50

## 2019-04-04 MED ORDER — SODIUM CHLORIDE (PF) 0.9 % IJ SOLN
INTRAMUSCULAR | Status: AC
  Administered 2019-04-04: 23:00:00
  Filled 2019-04-04: qty 50

## 2019-04-04 MED ORDER — FENTANYL CITRATE (PF) 100 MCG/2ML IJ SOLN
25.0000 ug | Freq: Once | INTRAMUSCULAR | Status: AC
  Administered 2019-04-04: 25 ug via INTRAVENOUS
  Filled 2019-04-04: qty 2

## 2019-04-04 NOTE — ED Provider Notes (Signed)
I assumed care of this patient from Dr. Jeraldine Loots at 2300.  Please see their note for further details of Hx, PE.  Briefly patient is a 53 y.o. female who presented with abd pain and LLQ TTP noted to have leukocytosis on CBC. pending CT.   CT with sigmoid colitis and possible microperforation. Zosyn started. Pain meds given. Consulted EGS, Dr. Carolynne Edouard, who request IV abx and medicine admission. Will see patient in the am.  Medicine admission for IV abx.  .Critical Care Performed by: Nira Conn, MD Authorized by: Nira Conn, MD       CRITICAL CARE Performed by: Amadeo Garnet Fionn Stracke Total critical care time: 30 minutes Critical care time was exclusive of separately billable procedures and treating other patients. Critical care was necessary to treat or prevent imminent or life-threatening deterioration. Critical care was time spent personally by me on the following activities: development of treatment plan with patient and/or surrogate as well as nursing, discussions with consultants, evaluation of patient's response to treatment, examination of patient, obtaining history from patient or surrogate, ordering and performing treatments and interventions, ordering and review of laboratory studies, ordering and review of radiographic studies, pulse oximetry and re-evaluation of patient's condition.     Nira Conn, MD 04/05/19 [phone]

## 2019-04-04 NOTE — ED Provider Notes (Signed)
[facility]-EMERGENCY DEPT Provider Note   CSN: [id number] Arrival date & time: 04/04/19  1952    History   Chief Complaint Chief Complaint  Patient presents with  . Dehydration  . Dizziness    HPI Molly Beck is a 53 y.o. female.     HPI Presents concern of abdominal pain, nausea, weakness.  Notably, the patient had elective surgery on her right foot 1 week ago. She notes that following the procedure she was discharged with oral narcotic medication, as well as oral stool softener. However, since that time she has had no bowel movements, has had increasing pain, primarily in the left lower quadrant, pain is described as sharp, severe, cramping, unrelenting. Notably, the patient did stop taking her narcotic pain medication 2 days ago in spite of the increasing pain. Today she was advised to take magnesium citrate by the surgical team She notes that since that she has had some small amount of leakage of stool, though no consistent bowel movements She states the last true bowel movement she had was greater than 1 week ago. There is associated nausea, weakness, but no fever, no chest pain, no dyspnea.  Past Medical History:  Diagnosis Date  . Allergy   . Anxiety   . GERD (gastroesophageal reflux disease)   . Headache    migraines  . Heartburn   . OA (osteoarthritis)    T/O  . Recurrent cold sores   . Tendon laceration    right  small finger  . Wears glasses     There are no active problems to display for this patient.   Past Surgical History:  Procedure Laterality Date  . I&D EXTREMITY Right 05/31/2017   Procedure: Right small finger irrigation and debridement and flexor tendon reconstruction;  Surgeon: Dominica Severin, MD;  Location: [facility] OR;  Service: Orthopedics;  Laterality: Right;  . KNEE ARTHROSCOPY     B/L  . REPLACEMENT TOTAL KNEE     B/L  . TENDON REPAIR     left thumb  . WISDOM TOOTH EXTRACTION       OB History   No  obstetric history on file.      Home Medications    Prior to Admission medications   Medication Sig Start Date End Date Taking? Authorizing Provider  acetaminophen (TYLENOL) 325 MG tablet Take 650 mg by mouth every 6 (six) hours as needed (for back pain/pain.).    [provider]  ALPRAZolam Prudy Feeler) 0.25 MG tablet Take 0.25 mg by mouth 2 (two) times daily as needed for anxiety.    [provider]  cetirizine (ZYRTEC) 10 MG tablet Take 10 mg by mouth daily.    [provider]  eletriptan (RELPAX) 40 MG tablet Take 40 mg by mouth as needed for migraine or headache. May repeat in 2 hours if headache persists or recurs.    [provider]  fluticasone (FLONASE) 50 MCG/ACT nasal spray Place 1 spray into both nostrils daily.    [provider]  Homeopathic Products [facility] ALLERGY EYE RELIEF OP) Apply to eye.    [provider]  ibuprofen (ADVIL,MOTRIN) 200 MG tablet Take 600 mg by mouth 2 (two) times daily as needed (for pain.).    [provider]  LACTASE PO Take 5 mg by mouth daily as needed (for milk consumption).    [provider]  Menthol, Topical Analgesic, (BENGAY EX) Apply 1 application topically 2 (two) times daily as needed (for lower back pain.).  [provider]  montelukast (SINGULAIR) 10 MG tablet Take 10 mg by mouth daily at 2 PM. 04/11/17   [provider]  ranitidine (ZANTAC) 150 MG tablet Take 150 mg by mouth 2 (two) times daily as needed for heartburn.    [provider]  traMADol (ULTRAM) 50 MG tablet Take 25-50 mg by mouth 3 (three) times daily as needed (for back pain.).    [provider]  valACYclovir (VALTREX) 500 MG tablet Take 500 mg by mouth 2 (two) times daily as needed. For cold sores 03/24/17   [provider]    Family History Family History  Problem Relation Age of Onset  . Macular degeneration Mother   . Pancreatic cancer Father   . Other  Sister     Social History Social History   Tobacco Use  . Smoking status: Former Games developer  . Smokeless tobacco: Never Used  . Tobacco comment: during college only  Substance Use Topics  . Alcohol use: Yes    Comment: occasional  . Drug use: No     Allergies   Codeine   Review of Systems Review of Systems  Constitutional:       Per HPI, otherwise negative  HENT:       Per HPI, otherwise negative  Respiratory:       Per HPI, otherwise negative  Cardiovascular:       Per HPI, otherwise negative  Gastrointestinal: Positive for abdominal pain and nausea. Negative for vomiting.  Endocrine:       Negative aside from HPI  Genitourinary:       Neg aside from HPI   Musculoskeletal:       Per HPI, otherwise negative  Skin: Negative.   Neurological: Positive for weakness. Negative for syncope.     Physical Exam Updated Vital Signs BP 108/85 (BP Location: Right Arm)   Pulse (!) 131   Temp 98 F (36.7 C) (Oral)   SpO2 100%   Physical Exam Vitals signs and nursing note reviewed.  Constitutional:      General: She is not in acute distress.    Appearance: She is well-developed.     Comments: Uncomfortable appearing thin female who appears older than stated age  HENT:     Head: Normocephalic and atraumatic.  Eyes:     Conjunctiva/sclera: Conjunctivae normal.  Cardiovascular:     Rate and Rhythm: Normal rate and regular rhythm.  Pulmonary:     Effort: Pulmonary effort is normal. No respiratory distress.     Breath sounds: Normal breath sounds. No stridor.  Abdominal:     General: There is no distension.     Tenderness: There is abdominal tenderness. There is guarding.     Comments: Tenderness palpation throughout the left lower quadrant  Musculoskeletal:     Comments: Patient's right lower extremity is in a postoperative cast.  No surrounding erythema.  Skin:    General: Skin is warm and dry.  Neurological:     Mental Status: She is alert and oriented to person,  place, and time.     Cranial Nerves: No cranial nerve deficit.      ED Treatments / Results  Labs (all labs ordered are listed, but only abnormal results are displayed) Labs Reviewed  COMPREHENSIVE METABOLIC PANEL  CBC WITH DIFFERENTIAL/PLATELET  MAGNESIUM  I-STAT BETA HCG BLOOD, ED ([facility], [facility], [facility] ONLY)    EKG None  Radiology No results found.  Procedures Procedures (including critical care time)  Medications Ordered  in ED Medications  sodium chloride 0.9 % bolus 1,000 mL (has no administration in time range)  ondansetron (ZOFRAN) injection 4 mg (has no administration in time range)  fentaNYL (SUBLIMAZE) injection 25 mcg (has no administration in time range)  sodium chloride 0.9 % bolus 1,000 mL (1,000 mLs Intravenous New Bag/Given 04/04/19 2101)  ondansetron (ZOFRAN) injection 4 mg (4 mg Intravenous Given 04/04/19 2101)     Initial Impression / Assessment and Plan / ED Course  I have reviewed the triage vital signs and the nursing notes.  Pertinent labs & imaging results that were available during my care of the patient were reviewed by me and considered in my medical decision making (see chart for details).  Adult female presents with left lower quadrant abdominal pain minimal stool production, and some severe pain following elective orthopedic procedure. No gross complication of the orthopedic procedure Given concern of substantial pain, there is some suspicion for diverticulitis versus ileus versus stercoral colitis Patient preparing for CT scan, has received multiple doses of pain medication, nausea medication, and IV fluids.  Dr. Eudelia Bunchardama is aware of the patient and will follow her course. Final Clinical Impressions(s) / ED Diagnoses  Abdominal pain   Gerhard MunchLockwood, Zandyr Barnhill, MD 04/04/19 2245

## 2019-04-04 NOTE — ED Triage Notes (Signed)
Pt complains of being dehydrated, dizzy and shaky for one week after her ankle/leg surgery She states she's been constipated and has tried to take medication for it with no results until today she started vomiting and having a little diarrhea

## 2019-04-04 NOTE — ED Notes (Signed)
Right lower extremity with cast lower extremity-elevated on 2 pillows for comfort

## 2019-04-05 DIAGNOSIS — Z9889 Other specified postprocedural states: Secondary | ICD-10-CM | POA: Diagnosis not present

## 2019-04-05 DIAGNOSIS — K56609 Unspecified intestinal obstruction, unspecified as to partial versus complete obstruction: Secondary | ICD-10-CM | POA: Diagnosis not present

## 2019-04-05 DIAGNOSIS — E876 Hypokalemia: Secondary | ICD-10-CM | POA: Diagnosis not present

## 2019-04-05 DIAGNOSIS — F419 Anxiety disorder, unspecified: Secondary | ICD-10-CM | POA: Diagnosis present

## 2019-04-05 DIAGNOSIS — K631 Perforation of intestine (nontraumatic): Secondary | ICD-10-CM | POA: Diagnosis not present

## 2019-04-05 DIAGNOSIS — K5720 Diverticulitis of large intestine with perforation and abscess without bleeding: Secondary | ICD-10-CM | POA: Diagnosis present

## 2019-04-05 DIAGNOSIS — T8141XA Infection following a procedure, superficial incisional surgical site, initial encounter: Secondary | ICD-10-CM | POA: Diagnosis not present

## 2019-04-05 DIAGNOSIS — Y848 Other medical procedures as the cause of abnormal reaction of the patient, or of later complication, without mention of misadventure at the time of the procedure: Secondary | ICD-10-CM | POA: Diagnosis not present

## 2019-04-05 DIAGNOSIS — T85698A Other mechanical complication of other specified internal prosthetic devices, implants and grafts, initial encounter: Secondary | ICD-10-CM | POA: Diagnosis not present

## 2019-04-05 DIAGNOSIS — E669 Obesity, unspecified: Secondary | ICD-10-CM | POA: Diagnosis present

## 2019-04-05 DIAGNOSIS — K567 Ileus, unspecified: Secondary | ICD-10-CM | POA: Diagnosis not present

## 2019-04-05 DIAGNOSIS — Y846 Urinary catheterization as the cause of abnormal reaction of the patient, or of later complication, without mention of misadventure at the time of the procedure: Secondary | ICD-10-CM | POA: Diagnosis not present

## 2019-04-05 DIAGNOSIS — R21 Rash and other nonspecific skin eruption: Secondary | ICD-10-CM | POA: Diagnosis not present

## 2019-04-05 DIAGNOSIS — K219 Gastro-esophageal reflux disease without esophagitis: Secondary | ICD-10-CM | POA: Diagnosis present

## 2019-04-05 DIAGNOSIS — E870 Hyperosmolality and hypernatremia: Secondary | ICD-10-CM | POA: Diagnosis not present

## 2019-04-05 DIAGNOSIS — A419 Sepsis, unspecified organism: Secondary | ICD-10-CM | POA: Diagnosis present

## 2019-04-05 DIAGNOSIS — K529 Noninfective gastroenteritis and colitis, unspecified: Secondary | ICD-10-CM | POA: Diagnosis not present

## 2019-04-05 DIAGNOSIS — R188 Other ascites: Secondary | ICD-10-CM | POA: Diagnosis not present

## 2019-04-05 DIAGNOSIS — N390 Urinary tract infection, site not specified: Secondary | ICD-10-CM | POA: Diagnosis not present

## 2019-04-05 DIAGNOSIS — Y92239 Unspecified place in hospital as the place of occurrence of the external cause: Secondary | ICD-10-CM | POA: Diagnosis not present

## 2019-04-05 DIAGNOSIS — L02211 Cutaneous abscess of abdominal wall: Secondary | ICD-10-CM | POA: Diagnosis not present

## 2019-04-05 DIAGNOSIS — R739 Hyperglycemia, unspecified: Secondary | ICD-10-CM | POA: Diagnosis present

## 2019-04-05 DIAGNOSIS — Z20828 Contact with and (suspected) exposure to other viral communicable diseases: Secondary | ICD-10-CM | POA: Diagnosis present

## 2019-04-05 DIAGNOSIS — M1990 Unspecified osteoarthritis, unspecified site: Secondary | ICD-10-CM | POA: Diagnosis present

## 2019-04-05 DIAGNOSIS — Z0189 Encounter for other specified special examinations: Secondary | ICD-10-CM | POA: Diagnosis not present

## 2019-04-05 DIAGNOSIS — T83098A Other mechanical complication of other indwelling urethral catheter, initial encounter: Secondary | ICD-10-CM | POA: Diagnosis not present

## 2019-04-05 DIAGNOSIS — Z87891 Personal history of nicotine dependence: Secondary | ICD-10-CM | POA: Diagnosis not present

## 2019-04-05 DIAGNOSIS — D62 Acute posthemorrhagic anemia: Secondary | ICD-10-CM | POA: Diagnosis not present

## 2019-04-05 DIAGNOSIS — Z6830 Body mass index (BMI) 30.0-30.9, adult: Secondary | ICD-10-CM | POA: Diagnosis not present

## 2019-04-05 DIAGNOSIS — E46 Unspecified protein-calorie malnutrition: Secondary | ICD-10-CM | POA: Diagnosis not present

## 2019-04-05 LAB — CBC WITH DIFFERENTIAL/PLATELET
Abs Immature Granulocytes: 0.07 10*3/uL (ref 0.00–0.07)
Basophils Absolute: 0 10*3/uL (ref 0.0–0.1)
Basophils Relative: 0 %
Eosinophils Absolute: 0 10*3/uL (ref 0.0–0.5)
Eosinophils Relative: 0 %
HCT: 36.5 % (ref 36.0–46.0)
Hemoglobin: 11.9 g/dL — ABNORMAL LOW (ref 12.0–15.0)
Immature Granulocytes: 1 %
Lymphocytes Relative: 7 %
Lymphs Abs: 0.8 10*3/uL (ref 0.7–4.0)
MCH: 30.7 pg (ref 26.0–34.0)
MCHC: 32.6 g/dL (ref 30.0–36.0)
MCV: 94.3 fL (ref 80.0–100.0)
Monocytes Absolute: 0.8 10*3/uL (ref 0.1–1.0)
Monocytes Relative: 7 %
Neutro Abs: 10 10*3/uL — ABNORMAL HIGH (ref 1.7–7.7)
Neutrophils Relative %: 85 %
Platelets: 319 10*3/uL (ref 150–400)
RBC: 3.87 MIL/uL (ref 3.87–5.11)
RDW: 12.9 % (ref 11.5–15.5)
WBC: 11.7 10*3/uL — ABNORMAL HIGH (ref 4.0–10.5)
nRBC: 0 % (ref 0.0–0.2)

## 2019-04-05 LAB — BASIC METABOLIC PANEL
Anion gap: 9 (ref 5–15)
BUN: 15 mg/dL (ref 6–20)
CO2: 22 mmol/L (ref 22–32)
Calcium: 8.1 mg/dL — ABNORMAL LOW (ref 8.9–10.3)
Chloride: 110 mmol/L (ref 98–111)
Creatinine, Ser: 0.68 mg/dL (ref 0.44–1.00)
GFR calc Af Amer: >60 mL/min (ref >60)
GFR calc non Af Amer: >60 mL/min (ref >60)
Glucose, Bld: 133 mg/dL — ABNORMAL HIGH (ref 70–99)
Potassium: 4.1 mmol/L (ref 3.5–5.1)
Sodium: 141 mmol/L (ref 135–145)

## 2019-04-05 LAB — MAGNESIUM: Magnesium: 2.3 mg/dL (ref 1.7–2.4)

## 2019-04-05 LAB — HIV ANTIBODY (ROUTINE TESTING W REFLEX): HIV Screen 4th Generation wRfx: NONREACTIVE

## 2019-04-05 LAB — SARS CORONAVIRUS 2 BY RT PCR (HOSPITAL ORDER, PERFORMED IN ~~LOC~~ HOSPITAL LAB): SARS Coronavirus 2: NEGATIVE

## 2019-04-05 LAB — LACTIC ACID, PLASMA: Lactic Acid, Venous: 1.2 mmol/L (ref 0.5–1.9)

## 2019-04-05 MED ORDER — HYDROMORPHONE HCL 1 MG/ML IJ SOLN
1.0000 mg | INTRAMUSCULAR | Status: DC | PRN
  Administered 2019-04-05 (×2): 1 mg via INTRAVENOUS
  Filled 2019-04-05 (×2): qty 1

## 2019-04-05 MED ORDER — PIPERACILLIN-TAZOBACTAM 3.375 G IVPB
3.3750 g | Freq: Three times a day (TID) | INTRAVENOUS | Status: DC
  Administered 2019-04-05 – 2019-04-22 (×52): 3.375 g via INTRAVENOUS
  Filled 2019-04-05 (×53): qty 50

## 2019-04-05 MED ORDER — SODIUM CHLORIDE 0.9 % IV SOLN
INTRAVENOUS | Status: DC
  Administered 2019-04-05: 03:00:00 via INTRAVENOUS

## 2019-04-05 MED ORDER — ONDANSETRON HCL 4 MG/2ML IJ SOLN
4.0000 mg | Freq: Four times a day (QID) | INTRAMUSCULAR | Status: DC | PRN
  Administered 2019-04-07 – 2019-04-18 (×10): 4 mg via INTRAVENOUS
  Filled 2019-04-05 (×9): qty 2

## 2019-04-05 MED ORDER — SODIUM CHLORIDE 0.9 % IV SOLN
INTRAVENOUS | Status: DC | PRN
  Administered 2019-04-05: 1000 mL via INTRAVENOUS
  Administered 2019-04-05: 250 mL via INTRAVENOUS

## 2019-04-05 MED ORDER — ACETAMINOPHEN 650 MG RE SUPP
650.0000 mg | Freq: Four times a day (QID) | RECTAL | Status: DC | PRN
  Administered 2019-04-08: 650 mg via RECTAL
  Filled 2019-04-05: qty 1

## 2019-04-05 MED ORDER — HYDROMORPHONE HCL 1 MG/ML IJ SOLN
1.0000 mg | INTRAMUSCULAR | Status: DC | PRN
  Administered 2019-04-05 (×4): 2 mg via INTRAVENOUS
  Administered 2019-04-06 (×2): 1 mg via INTRAVENOUS
  Administered 2019-04-06 (×3): 2 mg via INTRAVENOUS
  Administered 2019-04-06: 1 mg via INTRAVENOUS
  Administered 2019-04-07 (×3): 2 mg via INTRAVENOUS
  Administered 2019-04-07: 1 mg via INTRAVENOUS
  Administered 2019-04-07: 2 mg via INTRAVENOUS
  Administered 2019-04-07: 1 mg via INTRAVENOUS
  Administered 2019-04-08 (×3): 2 mg via INTRAVENOUS
  Administered 2019-04-08: 1 mg via INTRAVENOUS
  Administered 2019-04-08 – 2019-04-09 (×7): 2 mg via INTRAVENOUS
  Administered 2019-04-10: 1 mg via INTRAVENOUS
  Administered 2019-04-10 – 2019-04-11 (×6): 2 mg via INTRAVENOUS
  Administered 2019-04-11: 1 mg via INTRAVENOUS
  Administered 2019-04-11 (×3): 2 mg via INTRAVENOUS
  Administered 2019-04-12: 1 mg via INTRAVENOUS
  Administered 2019-04-12 (×4): 2 mg via INTRAVENOUS
  Administered 2019-04-13: 1 mg via INTRAVENOUS
  Administered 2019-04-13 (×3): 2 mg via INTRAVENOUS
  Administered 2019-04-13: 1 mg via INTRAVENOUS
  Administered 2019-04-13: 2 mg via INTRAVENOUS
  Administered 2019-04-13: 1 mg via INTRAVENOUS
  Administered 2019-04-14 – 2019-04-18 (×21): 2 mg via INTRAVENOUS
  Filled 2019-04-05 (×4): qty 2
  Filled 2019-04-05: qty 1
  Filled 2019-04-05 (×3): qty 2
  Filled 2019-04-05: qty 1
  Filled 2019-04-05 (×5): qty 2
  Filled 2019-04-05: qty 1
  Filled 2019-04-05 (×5): qty 2
  Filled 2019-04-05: qty 1
  Filled 2019-04-05 (×2): qty 2
  Filled 2019-04-05: qty 1
  Filled 2019-04-05 (×16): qty 2
  Filled 2019-04-05: qty 1
  Filled 2019-04-05 (×6): qty 2
  Filled 2019-04-05: qty 1
  Filled 2019-04-05 (×13): qty 2
  Filled 2019-04-05: qty 1
  Filled 2019-04-05 (×2): qty 2
  Filled 2019-04-05: qty 1
  Filled 2019-04-05 (×7): qty 2

## 2019-04-05 MED ORDER — SODIUM CHLORIDE 0.9 % IV BOLUS
500.0000 mL | Freq: Once | INTRAVENOUS | Status: AC
  Administered 2019-04-05: 500 mL via INTRAVENOUS

## 2019-04-05 MED ORDER — PROMETHAZINE HCL 25 MG/ML IJ SOLN
12.5000 mg | Freq: Once | INTRAMUSCULAR | Status: AC
  Administered 2019-04-05: 12.5 mg via INTRAVENOUS
  Filled 2019-04-05: qty 1

## 2019-04-05 MED ORDER — ONDANSETRON HCL 4 MG/2ML IJ SOLN: 4.0000 mg | Freq: Once | INTRAMUSCULAR | Status: DC

## 2019-04-05 MED ORDER — METHOCARBAMOL 1000 MG/10ML IJ SOLN
500.0000 mg | Freq: Three times a day (TID) | INTRAVENOUS | Status: DC | PRN
  Administered 2019-04-13 – 2019-04-20 (×8): 500 mg via INTRAVENOUS
  Filled 2019-04-05: qty 500
  Filled 2019-04-05: qty 5
  Filled 2019-04-05 (×5): qty 500
  Filled 2019-04-05: qty 5
  Filled 2019-04-05 (×2): qty 500

## 2019-04-05 MED ORDER — KETOROLAC TROMETHAMINE 30 MG/ML IJ SOLN
30.0000 mg | Freq: Four times a day (QID) | INTRAMUSCULAR | Status: AC | PRN
  Administered 2019-04-05 – 2019-04-07 (×5): 30 mg via INTRAVENOUS
  Filled 2019-04-05 (×5): qty 1

## 2019-04-05 MED ORDER — SODIUM CHLORIDE 0.9 % IV SOLN
INTRAVENOUS | Status: DC
  Administered 2019-04-05 – 2019-04-06 (×2): via INTRAVENOUS

## 2019-04-05 MED ORDER — POTASSIUM CHLORIDE 10 MEQ/100ML IV SOLN
10.0000 meq | INTRAVENOUS | Status: AC
  Administered 2019-04-05 (×4): 10 meq via INTRAVENOUS
  Filled 2019-04-05 (×4): qty 100

## 2019-04-05 MED ORDER — FLUTICASONE PROPIONATE 50 MCG/ACT NA SUSP
1.0000 | Freq: Every day | NASAL | Status: DC
  Administered 2019-04-06 – 2019-05-02 (×25): 1 via NASAL
  Filled 2019-04-05: qty 16

## 2019-04-05 MED ORDER — HYDROMORPHONE HCL 1 MG/ML IJ SOLN
1.0000 mg | Freq: Once | INTRAMUSCULAR | Status: AC
  Administered 2019-04-05: 1 mg via INTRAVENOUS
  Filled 2019-04-05: qty 1

## 2019-04-05 MED ORDER — ACETAMINOPHEN 325 MG PO TABS
650.0000 mg | ORAL_TABLET | Freq: Four times a day (QID) | ORAL | Status: DC | PRN
  Filled 2019-04-05: qty 2

## 2019-04-05 MED ORDER — ACETAMINOPHEN 325 MG PO TABS: 650.0000 mg | ORAL_TABLET | Freq: Four times a day (QID) | ORAL | Status: DC | PRN

## 2019-04-05 MED ORDER — ACETAMINOPHEN 650 MG RE SUPP: 650.0000 mg | Freq: Four times a day (QID) | RECTAL | Status: DC | PRN

## 2019-04-05 NOTE — ED Notes (Signed)
ED TO INPATIENT HANDOFF REPORT  Name/Age/Gender Molly Beck Tempesta 53 y.o. female  Code Status    Code Status Orders  (From admission, onward)         Start     Ordered   04/05/19 0100  Full code  Continuous     04/05/19 0101        Code Status History    This patient has a current code status but no historical code status.      Home/SNF/Other Home  Chief Complaint Dehydrated, Dizziness  Level of Care/Admitting Diagnosis ED Disposition    ED Disposition Condition Comment   Admit  [facility] [[id number]]  Level of Care: Telemetry [5]  Admit to tele based on following criteria: Other see comments  Comments: Monitor hemodynamics  Covid Evaluation: Person Under Investigation (PUI)  Isolation Risk Level: Low Risk/Droplet (Less than 4L [place] supplementation)  Diagnosis: Colitis [[id number]][[id number]]  Admitting Physician: John GiovanniATHORE, VASUNDHRA [[id number]]  Attending Physician: John GiovanniATHORE, VASUNDHRA [[id number]]  Estimated length of stay: past midnight tomorrow  Certification:: I certify this patient will need inpatient services for at least 2 midnights  PT Class (Do Not Modify): Inpatient [101]  PT Acc Code (Do Not Modify): Private [1]       Medical History Past Medical History:  Diagnosis Date  . Allergy   . Anxiety   . GERD (gastroesophageal reflux disease)   . Headache    migraines  . Heartburn   . OA (osteoarthritis)    Beck/O  . Recurrent cold sores   . Tendon laceration    right  small finger  . Wears glasses     Allergies Allergies  Allergen Reactions  . Codeine Nausea Only    IV Location/Drains/Wounds Patient Lines/Drains/Airways Status   Active Line/Drains/Airways    Name:   Placement date:   Placement time:   Site:   Days:   Peripheral IV 04/04/19 Left Antecubital   04/04/19    2055    Antecubital   1   Incision (Closed) 05/31/17 Hand Right   05/31/17    2113     674          Labs/Imaging Results for orders placed or performed  during the hospital encounter of 04/04/19 (from the past 48 hour(s))  Comprehensive metabolic panel     Status: Abnormal   Collection Time: 04/04/19  9:00 PM  Result Value Ref Range   Sodium 140 135 - 145 mmol/L   Potassium 3.2 (L) 3.5 - 5.1 mmol/L   Chloride 103 98 - 111 mmol/L   CO2 24 22 - 32 mmol/L   Glucose, Bld 149 (H) 70 - 99 mg/dL   BUN 17 6 - 20 mg/dL   Creatinine, Ser 2.950.67 0.44 - 1.00 mg/dL   Calcium 9.1 8.9 - 62.110.3 mg/dL   Total Protein 7.7 6.5 - 8.1 g/dL   Albumin 4.2 3.5 - 5.0 g/dL   AST 20 15 - 41 U/L   ALT 18 0 - 44 U/L   Alkaline Phosphatase 101 38 - 126 U/L   Total Bilirubin 0.8 0.3 - 1.2 mg/dL   GFR calc non Af Amer >60 >60 mL/min   GFR calc Af Amer >60 >60 mL/min   Anion gap 13 5 - 15    Comment: Performed at [facility], [address] [place], [place] [address]  CBC with Differential     Status: Abnormal   Collection Time: 04/04/19  9:00 PM  Result Value  Ref Range   WBC 14.0 (H) 4.0 - 10.5 K/uL   RBC 4.46 3.87 - 5.11 MIL/uL   Hemoglobin 13.7 12.0 - 15.0 g/dL   HCT 25.0 53.9 - 76.7 %   MCV 91.3 80.0 - 100.0 fL   MCH 30.7 26.0 - 34.0 pg   MCHC 33.7 30.0 - 36.0 g/dL   RDW 34.1 93.7 - 90.2 %   Platelets 380 150 - 400 K/uL   nRBC 0.0 0.0 - 0.2 %   Neutrophils Relative % 88 %   Neutro Abs 12.3 (H) 1.7 - 7.7 K/uL   Lymphocytes Relative 3 %   Lymphs Abs 0.4 (L) 0.7 - 4.0 K/uL   Monocytes Relative 9 %   Monocytes Absolute 1.3 (H) 0.1 - 1.0 K/uL   Eosinophils Relative 0 %   Eosinophils Absolute 0.0 0.0 - 0.5 K/uL   Basophils Relative 0 %   Basophils Absolute 0.0 0.0 - 0.1 K/uL   Immature Granulocytes 0 %   Abs Immature Granulocytes 0.05 0.00 - 0.07 K/uL    Comment: Performed at [facility], [address] [place], [place] [address]  Magnesium     Status: Abnormal   Collection Time: 04/04/19  9:00 PM  Result Value Ref Range   Magnesium 2.7 (H) 1.7 - 2.4 mg/dL    Comment: Performed at [facility],  [address] [place], [place] [address]  I-Stat beta hCG blood, ED     Status: None   Collection Time: 04/04/19  9:10 PM  Result Value Ref Range   I-stat hCG, quantitative <5.0 <5 mIU/mL   Comment 3            Comment:   GEST. AGE      CONC.  (mIU/mL)   <=1 WEEK        5 - 50     2 WEEKS       50 - 500     3 WEEKS       100 - 10,000     4 WEEKS     1,000 - 30,000        FEMALE AND NON-PREGNANT FEMALE:     LESS THAN 5 mIU/mL   SARS Coronavirus 2 (CEPHEID - Performed in [facility] hospital lab), Hosp Order     Status: None   Collection Time: 04/05/19 12:14 AM  Result Value Ref Range   SARS Coronavirus 2 NEGATIVE NEGATIVE    Comment: (NOTE) If result is NEGATIVE SARS-CoV-2 target nucleic acids are NOT DETECTED. The SARS-CoV-2 RNA is generally detectable in upper and lower  respiratory specimens during the acute phase of infection. The lowest  concentration of SARS-CoV-2 viral copies this assay can detect is 250  copies / mL. A negative result does not preclude SARS-CoV-2 infection  and should not be used as the sole basis for treatment or other  patient management decisions.  A negative result may occur with  improper specimen collection / handling, submission of specimen other  than nasopharyngeal swab, presence of viral mutation(s) within the  areas targeted by this assay, and inadequate number of viral copies  (<250 copies / mL). A negative result must be combined with clinical  observations, patient history, and epidemiological information. If result is POSITIVE SARS-CoV-2 target nucleic acids are DETECTED. The SARS-CoV-2 RNA is generally detectable in upper and lower  respiratory specimens dur ing the acute phase of infection.  Positive  results are indicative of active infection with SARS-CoV-2.  Clinical  correlation with patient history and other diagnostic information is  necessary to determine patient infection status.  Positive results do  not rule out bacterial  infection or co-infection with other viruses. If result is PRESUMPTIVE POSTIVE SARS-CoV-2 nucleic acids MAY BE PRESENT.   A presumptive positive result was obtained on the submitted specimen  and confirmed on repeat testing.  While 2019 novel coronavirus  (SARS-CoV-2) nucleic acids may be present in the submitted sample  additional confirmatory testing may be necessary for epidemiological  and / or clinical management purposes  to differentiate between  SARS-CoV-2 and other Sarbecovirus currently known to infect humans.  If clinically indicated additional testing with an alternate test  methodology [phone] is advised. The SARS-CoV-2 RNA is generally  detectable in upper and lower respiratory sp ecimens during the acute  phase of infection. The expected result is Negative. Fact Sheet for Patients:  BoilerBrush.com.cy Fact Sheet for Healthcare Providers: https://pope.com/ This test is not yet approved or cleared by the [place] FDA and has been authorized for detection and/or diagnosis of SARS-CoV-2 by FDA under an Emergency Use Authorization (EUA).  This EUA will remain in effect (meaning this test can be used) for the duration of the COVID-19 declaration under Section 564(b)(1) of the Act, 21 U.S.C. section 360bbb-3(b)(1), unless the authorization is terminated or revoked sooner. Performed at [facility], [address], [place], [place] [address]    Ct Abdomen Pelvis W Contrast  Result Date: 04/04/2019 CLINICAL DATA:  Abdominal pain and decreased bowel movement since surgery EXAM: CT ABDOMEN AND PELVIS WITH CONTRAST TECHNIQUE: Multidetector CT imaging of the abdomen and pelvis was performed using the standard protocol following bolus administration of intravenous contrast. CONTRAST:  OMNIPAQUE IOHEXOL 300 MG/ML  SOLN COMPARISON:  None. FINDINGS: LOWER CHEST: There is no basilar pleural or apical pericardial  effusion. HEPATOBILIARY: The hepatic contours and density are normal. There is no intra- or extrahepatic biliary dilatation. The gallbladder is normal. PANCREAS: The pancreatic parenchymal contours are normal and there is no ductal dilatation. There is no peripancreatic fluid collection. SPLEEN: Normal. ADRENALS/URINARY TRACT: --Adrenal glands: Normal. --Right kidney/ureter: No hydronephrosis, nephroureterolithiasis, perinephric stranding or solid renal mass. --Left kidney/ureter: No hydronephrosis, nephroureterolithiasis, perinephric stranding or solid renal mass. --Urinary bladder: Normal for degree of distention STOMACH/BOWEL: --Stomach/Duodenum: There is no hiatal hernia or other gastric abnormality. The duodenal course and caliber are normal. --Small bowel: No dilatation or inflammation. --Colon: There is mild inflammation of the sigmoid colon. There are small foci of gas along the anterior contour of the distal sigmoid colon that may indicate microperforation (coronal images 43, 46, 47). Large amount of colonic stool. --Appendix: Not clearly visualized. VASCULAR/LYMPHATIC: Normal course and caliber of the major abdominal vessels. No abdominal or pelvic lymphadenopathy. REPRODUCTIVE: There is a Beck-shaped contraceptive device within the uterus. MUSCULOSKELETAL. No bony spinal canal stenosis or focal osseous abnormality. OTHER: None. IMPRESSION: Acute sigmoid colitis with small foci of adjacent gas that may be extraluminal. This may indicate micro perforation. No abscess or drainable fluid collection. Electronically Signed   By: Deatra Robinson M.D.   On: 04/04/2019 23:32    Pending Labs Unresulted Labs (From admission, onward)    Start     Ordered   04/05/19 0500  CBC with Differential/Platelet  Tomorrow morning,   R     04/05/19 0101   04/05/19 0500  Basic metabolic panel  Tomorrow morning,   R     04/05/19 0110   04/05/19 0105  Culture, blood (routine x 2)  BLOOD CULTURE X 2,   R     04/05/19 0105    04/05/19 0102  Magnesium  Add-on,   R     04/05/19 0101   04/05/19 0059  HIV antibody (Routine Testing)  Once,   R     04/05/19 0101          Vitals/Pain Today's Vitals   04/05/19 0044 04/05/19 0051 04/05/19 0137 04/05/19 0137  BP:  (!) 141/93  126/83  Pulse:  (!) 101  (!) 107  Resp:  (!) 22  (!) 22  Temp:    97.9 F (36.6 C)  TempSrc:      SpO2:  94%  93%  PainSc: Isolation Precautions No active isolations  Medications Medications  sodium chloride (PF) 0.9 % injection (has no administration in time range)  0.9 %  sodium chloride infusion (1,000 mLs Intravenous New Bag/Given 04/05/19 0007)  HYDROmorphone (DILAUDID) injection 1 mg (has no administration in time range)  fluticasone (FLONASE) 50 MCG/ACT nasal spray 1 spray (has no administration in time range)  acetaminophen (TYLENOL) tablet 650 mg (has no administration in time range)    Or  acetaminophen (TYLENOL) suppository 650 mg (has no administration in time range)  ondansetron (ZOFRAN) injection 4 mg (has no administration in time range)  potassium chloride 10 mEq in 100 mL IVPB (10 mEq Intravenous New Bag/Given 04/05/19 0140)  0.9 %  sodium chloride infusion (has no administration in time range)  sodium chloride 0.9 % bolus 500 mL (has no administration in time range)  piperacillin-tazobactam (ZOSYN) IVPB 3.375 g (has no administration in time range)  sodium chloride 0.9 % bolus 1,000 mL (0 mLs Intravenous Stopped 04/04/19 2159)  ondansetron (ZOFRAN) injection 4 mg (4 mg Intravenous Given 04/04/19 2101)  sodium chloride 0.9 % bolus 1,000 mL (0 mLs Intravenous Stopped 04/04/19 2345)  ondansetron (ZOFRAN) injection 4 mg (4 mg Intravenous Given 04/04/19 2203)  fentaNYL (SUBLIMAZE) injection 25 mcg (25 mcg Intravenous Given 04/04/19 2112)  morphine 2 MG/ML injection 2 mg (2 mg Intravenous Given 04/04/19 2203)  iohexol (OMNIPAQUE) 300 MG/ML solution 100 mL (100 mLs Intravenous Contrast Given 04/04/19 2256)   piperacillin-tazobactam (ZOSYN) IVPB 3.375 g (3.375 g Intravenous New Bag/Given 04/05/19 0049)  HYDROmorphone (DILAUDID) injection 1 mg (1 mg Intravenous Given 04/05/19 0055)  promethazine (PHENERGAN) injection 12.5 mg (12.5 mg Intravenous Given 04/05/19 0056)    Mobility walks

## 2019-04-05 NOTE — Progress Notes (Addendum)
Same day note  Patient seen and examined at bedside.  Patient was admitted to the hospital for abdominal pain, acute colitis  At the time of my evaluation, patient complains of intermittent abdominal pain on the background of constant abdominal pain.  No vomiting.  Had bowel movement recently.  Physical examination reveals dry mucosa, tenderness over the left lower quadrant, right lower extremity on a cast.  Laboratory data and imaging was reviewed  Assessment and Plan. Acute sigmoid diverticulitis with microperforation.  No signs of peritonitis.  Surgery on board.  Continue IV antibiotics with Zosyn, bowel rest IV fluids n.p.o.  Tachycardia is improved.  Continue IV fluids.  We will monitor CBC.  Patient did have some signs of sepsis on presentation.  Continue IV fluids, IV antibiotics, analgesics.  Continue to replenish electrolytes.  We will follow surgical recommendation.  No Charge  Signed,  Tenny Craw, MD [facility]

## 2019-04-05 NOTE — Progress Notes (Signed)
Pharmacy Antibiotic Note  Molly Beck is a 53 y.o. female admitted on 04/04/2019 with Intra-abdominal infection.  Pharmacy has been consulted for zosyn dosing.  Plan: Zosyn 3.375g IV q8h (4 hour infusion).     Temp (24hrs), Avg:98 F (36.7 C), Min:98 F (36.7 C), Max:98 F (36.7 C)  Recent Labs  Lab 04/04/19 2100  WBC 14.0*  CREATININE 0.67    CrCl cannot be calculated (Unknown ideal weight.).    Allergies  Allergen Reactions  . Codeine Nausea Only    Antimicrobials this admission: Zosyn 04/04/2019 >>   Dose adjustments this admission: -  Microbiology results: -  Thank you for allowing pharmacy to be a part of this patient's care.  Aleene Davidson Crowford 04/05/2019 1:22 AM

## 2019-04-05 NOTE — Progress Notes (Signed)
Patient is complaining of pressure in her lower abdomen/groin area. She asking to get a urine culture. NP Blount was notified.

## 2019-04-05 NOTE — Consult Note (Signed)
[facility] Surgery Consult Note  Molly Beck [birth year]  [id number].    Requesting MD: John Giovanni Chief Complaint/Reason for Consult: colitis  HPI:  Molly Beck is a 52yo female PMH GERD, osteoarthritis, and recent right ankle surgery by Dr. Victorino Dike about 1 week ago, who was admitted to [facility] yesterday with worsening abdominal pain. States that she had not had a good BM since her surgery. She started having lower abdominal pain 2 days ago. Pain gradually worsening until it became severe and associated with nausea and vomiting. Describes the pain as crampy and sharp. Pain is now more diffuse but still most severe in the LLQ. She tried taking magnesium citrate but this did not help. Denies fever, chills, dysuria, diarrhea. States that she has never had pain like this before.  In the ED she had a CT scan which showed acute sigmoid colitis with small foci of adjacent gas indicative of micro perforation, no abscess or drainable fluid collection. WBC 14. Patient was admitted to the medical service and started on IV zosyn.  General surgery asked to see today. Molly Beck does state that she had a normal BM this morning, nonbloody. Dilaudid helps her pain but only lasts about 2 hours. She has never had a colonoscopy before and she has never had abdominal surgery. Nonsmoker. Works at [employer].  ROS: Review of Systems  Constitutional: Negative.  Negative for chills and fever.  HENT: Negative.   Eyes: Negative.   Respiratory: Negative.   Cardiovascular: Negative.   Gastrointestinal: Positive for abdominal pain, constipation, nausea and vomiting. Negative for diarrhea.  Genitourinary: Negative.   Musculoskeletal: Positive for joint pain.  Skin: Negative.   Neurological: Negative.    All systems reviewed and otherwise negative except for as above  Family History  Problem Relation Age of Onset  . Macular degeneration Mother   . Pancreatic cancer Father   . Other  Sister     Past Medical History:  Diagnosis Date  . Allergy   . Anxiety   . GERD (gastroesophageal reflux disease)   . Headache    migraines  . Heartburn   . OA (osteoarthritis)    T/O  . Recurrent cold sores   . Tendon laceration    right  small finger  . Wears glasses     Past Surgical History:  Procedure Laterality Date  . I&D EXTREMITY Right 05/31/2017   Procedure: Right small finger irrigation and debridement and flexor tendon reconstruction;  Surgeon: Dominica Severin, MD;  Location: [facility] OR;  Service: Orthopedics;  Laterality: Right;  . KNEE ARTHROSCOPY     B/L  . REPLACEMENT TOTAL KNEE     B/L  . TENDON REPAIR     left thumb  . WISDOM TOOTH EXTRACTION      Social History:  reports that she has quit smoking. She has never used smokeless tobacco. She reports current alcohol use. She reports that she does not use drugs.  Allergies:  Allergies  Allergen Reactions  . Codeine Nausea Only    Medications Prior to Admission  Medication Sig Dispense Refill  . acetaminophen (TYLENOL) 325 MG tablet Take 650 mg by mouth every 6 (six) hours as needed (for back pain/pain.).    Marland Kitchen ALPRAZolam (XANAX) 0.25 MG tablet Take 0.25 mg by mouth 2 (two) times daily as needed for anxiety.    . ASPIRIN ADULT LOW STRENGTH 81 MG EC tablet Take 81 mg by mouth daily.    . cetirizine (ZYRTEC) 10 MG tablet  Take 10 mg by mouth daily.    Marland Kitchen eletriptan (RELPAX) 40 MG tablet Take 40 mg by mouth as needed for migraine or headache. May repeat in 2 hours if headache persists or recurs.    . fluticasone (FLONASE) 50 MCG/ACT nasal spray Place 1 spray into both nostrils daily.    . Homeopathic Products [facility] ALLERGY EYE RELIEF OP) Apply to eye.    Marland Kitchen ibuprofen (ADVIL,MOTRIN) 200 MG tablet Take 600 mg by mouth 2 (two) times daily as needed (for pain.).    Marland Kitchen LACTASE PO Take 5 mg by mouth daily as needed (for milk consumption).    . meloxicam (MOBIC) 15 MG tablet Take 15 mg by mouth daily as needed for  muscle spasms.    . montelukast (SINGULAIR) 10 MG tablet Take 10 mg by mouth daily at 2 PM.    . ondansetron (ZOFRAN) 4 MG tablet Take 4 mg by mouth daily as needed for nausea.    . traMADol (ULTRAM) 50 MG tablet Take 25-50 mg by mouth 3 (three) times daily as needed (for back pain.).    Marland Kitchen valACYclovir (VALTREX) 500 MG tablet Take 500 mg by mouth 2 (two) times daily as needed. For cold sores    . oxyCODONE (OXY IR/ROXICODONE) 5 MG immediate release tablet Take 5 mg by mouth every 4 (four) hours as needed for pain.      Prior to Admission medications   Medication Sig Start Date End Date Taking? Authorizing Provider  acetaminophen (TYLENOL) 325 MG tablet Take 650 mg by mouth every 6 (six) hours as needed (for back pain/pain.).   Yes [provider]  ALPRAZolam (XANAX) 0.25 MG tablet Take 0.25 mg by mouth 2 (two) times daily as needed for anxiety.   Yes [provider]  ASPIRIN ADULT LOW STRENGTH 81 MG EC tablet Take 81 mg by mouth daily. 03/27/19  Yes [provider]  cetirizine (ZYRTEC) 10 MG tablet Take 10 mg by mouth daily.   Yes [provider]  eletriptan (RELPAX) 40 MG tablet Take 40 mg by mouth as needed for migraine or headache. May repeat in 2 hours if headache persists or recurs.   Yes [provider]  fluticasone (FLONASE) 50 MCG/ACT nasal spray Place 1 spray into both nostrils daily.   Yes [provider]  Homeopathic Products [facility] ALLERGY EYE RELIEF OP) Apply to eye.   Yes [provider]  ibuprofen (ADVIL,MOTRIN) 200 MG tablet Take 600 mg by mouth 2 (two) times daily as needed (for pain.).   Yes [provider]  LACTASE PO Take 5 mg by mouth daily as needed (for milk consumption).   Yes [provider]  meloxicam (MOBIC) 15 MG tablet Take 15 mg by mouth daily as needed for muscle spasms. 03/14/19  Yes [provider]  montelukast (SINGULAIR) 10 MG tablet Take 10 mg by mouth daily at 2 PM.  04/11/17  Yes [provider]  ondansetron (ZOFRAN) 4 MG tablet Take 4 mg by mouth daily as needed for nausea. 03/27/19  Yes [provider]  traMADol (ULTRAM) 50 MG tablet Take 25-50 mg by mouth 3 (three) times daily as needed (for back pain.).   Yes [provider]  valACYclovir (VALTREX) 500 MG tablet Take 500 mg by mouth 2 (two) times daily as needed. For cold sores 03/24/17  Yes [provider]  oxyCODONE (OXY IR/ROXICODONE) 5 MG immediate release tablet Take 5 mg by mouth every 4 (four) hours as needed for  pain. 03/27/19   [provider]    Blood pressure (!) 142/88, pulse 98, temperature 99.8 F (37.7 C), temperature source Oral, resp. rate 20, SpO2 97 %. Physical Exam: General: pleasant, WD/WN white female who is laying in bed in NAD HEENT: head is normocephalic, atraumatic.  Sclera are noninjected.  Pupils equal and round.  Ears and nose without any masses or lesions.  Mouth is pink and moist. Dentition fair Heart: regular, rate, and rhythm.  No obvious murmurs, gallops, or rubs noted.  2+ left DP pulse Lungs: CTAB, no wheezes, rhonchi, or rales noted.  Respiratory effort nonlabored Abd: soft, ND, +BS, no masses, hernias, or organomegaly. Mild diffuse tenderness with most significant pain in the LLQ with voluntary guarding, no peritonitis MS: splint to RLE. Left calf soft and nontender Skin: warm and dry with no masses, lesions, or rashes Psych: A&Ox3 with an appropriate affect. Neuro: cranial nerves grossly intact, moving all 4 extremities, normal speech  Results for orders placed or performed during the hospital encounter of 04/04/19 (from the past 48 hour(s))  Comprehensive metabolic panel     Status: Abnormal   Collection Time: 04/04/19  9:00 PM  Result Value Ref Range   Sodium 140 135 - 145 mmol/L   Potassium 3.2 (L) 3.5 - 5.1 mmol/L   Chloride 103 98 - 111 mmol/L   CO2 24 22 - 32 mmol/L   Glucose, Bld 149 (H) 70 - 99 mg/dL   BUN 17  6 - 20 mg/dL   Creatinine, Ser 1.59 0.44 - 1.00 mg/dL   Calcium 9.1 8.9 - 45.8 mg/dL   Total Protein 7.7 6.5 - 8.1 g/dL   Albumin 4.2 3.5 - 5.0 g/dL   AST 20 15 - 41 U/L   ALT 18 0 - 44 U/L   Alkaline Phosphatase 101 38 - 126 U/L   Total Bilirubin 0.8 0.3 - 1.2 mg/dL   GFR calc non Af Amer >60 >60 mL/min   GFR calc Af Amer >60 >60 mL/min   Anion gap 13 5 - 15    Comment: Performed at [facility], [address], [place], [place] [address]  CBC with Differential     Status: Abnormal   Collection Time: 04/04/19  9:00 PM  Result Value Ref Range   WBC 14.0 (H) 4.0 - 10.5 K/uL   RBC 4.46 3.87 - 5.11 MIL/uL   Hemoglobin 13.7 12.0 - 15.0 g/dL   HCT 44.6 28.6 - 38.1 %   MCV 91.3 80.0 - 100.0 fL   MCH 30.7 26.0 - 34.0 pg   MCHC 33.7 30.0 - 36.0 g/dL   RDW 77.1 16.5 - 79.0 %   Platelets 380 150 - 400 K/uL   nRBC 0.0 0.0 - 0.2 %   Neutrophils Relative % 88 %   Neutro Abs 12.3 (H) 1.7 - 7.7 K/uL   Lymphocytes Relative 3 %   Lymphs Abs 0.4 (L) 0.7 - 4.0 K/uL   Monocytes Relative 9 %   Monocytes Absolute 1.3 (H) 0.1 - 1.0 K/uL   Eosinophils Relative 0 %   Eosinophils Absolute 0.0 0.0 - 0.5 K/uL   Basophils Relative 0 %   Basophils Absolute 0.0 0.0 - 0.1 K/uL   Immature Granulocytes 0 %   Abs Immature Granulocytes 0.05 0.00 - 0.07 K/uL    Comment: Performed at [facility], [address], [place], [place] [address]  Magnesium     Status: Abnormal   Collection Time: 04/04/19  9:00 PM  Result Value Ref Range   Magnesium 2.7 (H) 1.7 - 2.4 mg/dL    Comment: Performed at [facility], [address] [place], [place] [id number]  I-Stat beta hCG blood, ED     Status: None   Collection Time: 04/04/19  9:10 PM  Result Value Ref Range   I-stat hCG, quantitative <5.0 <5 mIU/mL   Comment 3            Comment:   GEST. AGE      CONC.  (mIU/mL)   <=1 WEEK        5 - 50     2 WEEKS       50 - 500     3 WEEKS       100 - 10,000     4  WEEKS     1,000 - 30,000        FEMALE AND NON-PREGNANT FEMALE:     LESS THAN 5 mIU/mL   SARS Coronavirus 2 (CEPHEID - Performed in [facility] hospital lab), Hosp Order     Status: None   Collection Time: 04/05/19 12:14 AM  Result Value Ref Range   SARS Coronavirus 2 NEGATIVE NEGATIVE    Comment: (NOTE) If result is NEGATIVE SARS-CoV-2 target nucleic acids are NOT DETECTED. The SARS-CoV-2 RNA is generally detectable in upper and lower  respiratory specimens during the acute phase of infection. The lowest  concentration of SARS-CoV-2 viral copies this assay can detect is 250  copies / mL. A negative result does not preclude SARS-CoV-2 infection  and should not be used as the sole basis for treatment or other  patient management decisions.  A negative result may occur with  improper specimen collection / handling, submission of specimen other  than nasopharyngeal swab, presence of viral mutation(s) within the  areas targeted by this assay, and inadequate number of viral copies  (<250 copies / mL). A negative result must be combined with clinical  observations, patient history, and epidemiological information. If result is POSITIVE SARS-CoV-2 target nucleic acids are DETECTED. The SARS-CoV-2 RNA is generally detectable in upper and lower  respiratory specimens dur ing the acute phase of infection.  Positive  results are indicative of active infection with SARS-CoV-2.  Clinical  correlation with patient history and other diagnostic information is  necessary to determine patient infection status.  Positive results do  not rule out bacterial infection or co-infection with other viruses. If result is PRESUMPTIVE POSTIVE SARS-CoV-2 nucleic acids MAY BE PRESENT.   A presumptive positive result was obtained on the submitted specimen  and confirmed on repeat testing.  While 2019 novel coronavirus  (SARS-CoV-2) nucleic acids may be present in the submitted sample  additional confirmatory  testing may be necessary for epidemiological  and / or clinical management purposes  to differentiate between  SARS-CoV-2 and other Sarbecovirus currently known to infect humans.  If clinically indicated additional testing with an alternate test  methodology [phone]([id number]) is advised. The SARS-CoV-2 RNA is generally  detectable in upper and lower respiratory sp ecimens during the acute  phase of infection. The expected result is Negative. Fact Sheet for Patients:  BoilerBrush.com.cyhttps://www.fda.gov/media/136312/download Fact Sheet for Healthcare Providers: https://pope.com/https://www.fda.gov/media/136313/download This test is not yet approved or cleared by the [place] FDA and has been authorized for detection and/or diagnosis of SARS-CoV-2 by FDA under an Emergency Use Authorization (EUA).  This EUA will remain in effect (meaning this test can be used) for the duration  of the COVID-19 declaration under Section 564(b)(1) of the Act, 21 U.S.C. section 360bbb-3(b)(1), unless the authorization is terminated or revoked sooner. Performed at [facility], [address], [place], [place] [address]   Lactic acid, plasma     Status: None   Collection Time: 04/05/19  2:53 AM  Result Value Ref Range   Lactic Acid, Venous 1.2 0.5 - 1.9 mmol/L    Comment: Performed at [facility], [address] [place], [place] [address]  CBC with Differential/Platelet     Status: Abnormal   Collection Time: 04/05/19  6:08 AM  Result Value Ref Range   WBC 11.7 (H) 4.0 - 10.5 K/uL   RBC 3.87 3.87 - 5.11 MIL/uL   Hemoglobin 11.9 (L) 12.0 - 15.0 g/dL   HCT 57.8 46.9 - 62.9 %   MCV 94.3 80.0 - 100.0 fL   MCH 30.7 26.0 - 34.0 pg   MCHC 32.6 30.0 - 36.0 g/dL   RDW 52.8 41.3 - 24.4 %   Platelets 319 150 - 400 K/uL   nRBC 0.0 0.0 - 0.2 %   Neutrophils Relative % 85 %   Neutro Abs 10.0 (H) 1.7 - 7.7 K/uL   Lymphocytes Relative 7 %   Lymphs Abs 0.8 0.7 - 4.0 K/uL   Monocytes Relative 7 %   Monocytes  Absolute 0.8 0.1 - 1.0 K/uL   Eosinophils Relative 0 %   Eosinophils Absolute 0.0 0.0 - 0.5 K/uL   Basophils Relative 0 %   Basophils Absolute 0.0 0.0 - 0.1 K/uL   Immature Granulocytes 1 %   Abs Immature Granulocytes 0.07 0.00 - 0.07 K/uL    Comment: Performed at [facility], [address], [place], [place] [address]  Basic metabolic panel     Status: Abnormal   Collection Time: 04/05/19  6:08 AM  Result Value Ref Range   Sodium 141 135 - 145 mmol/L   Potassium 4.1 3.5 - 5.1 mmol/L    Comment: DELTA CHECK NOTED   Chloride 110 98 - 111 mmol/L   CO2 22 22 - 32 mmol/L   Glucose, Bld 133 (H) 70 - 99 mg/dL   BUN 15 6 - 20 mg/dL   Creatinine, Ser 2.53 0.44 - 1.00 mg/dL   Calcium 8.1 (L) 8.9 - 10.3 mg/dL   GFR calc non Af Amer >60 >60 mL/min   GFR calc Af Amer >60 >60 mL/min   Anion gap 9 5 - 15    Comment: Performed at [facility], [address], [place], [place] [address]  Magnesium     Status: None   Collection Time: 04/05/19  6:08 AM  Result Value Ref Range   Magnesium 2.3 1.7 - 2.4 mg/dL    Comment: Performed at [facility], [address] [place], [place] [address]   Ct Abdomen Pelvis W Contrast  Result Date: 04/04/2019 CLINICAL DATA:  Abdominal pain and decreased bowel movement since surgery EXAM: CT ABDOMEN AND PELVIS WITH CONTRAST TECHNIQUE: Multidetector CT imaging of the abdomen and pelvis was performed using the standard protocol following bolus administration of intravenous contrast. CONTRAST:  OMNIPAQUE IOHEXOL 300 MG/ML  SOLN COMPARISON:  None. FINDINGS: LOWER CHEST: There is no basilar pleural or apical pericardial effusion. HEPATOBILIARY: The hepatic contours and density are normal. There is no intra- or extrahepatic biliary dilatation. The gallbladder is normal. PANCREAS: The pancreatic parenchymal contours are normal and there is no ductal dilatation. There is no  peripancreatic fluid collection. SPLEEN:  Normal. ADRENALS/URINARY TRACT: --Adrenal glands: Normal. --Right kidney/ureter: No hydronephrosis, nephroureterolithiasis, perinephric stranding or solid renal mass. --Left kidney/ureter: No hydronephrosis, nephroureterolithiasis, perinephric stranding or solid renal mass. --Urinary bladder: Normal for degree of distention STOMACH/BOWEL: --Stomach/Duodenum: There is no hiatal hernia or other gastric abnormality. The duodenal course and caliber are normal. --Small bowel: No dilatation or inflammation. --Colon: There is mild inflammation of the sigmoid colon. There are small foci of gas along the anterior contour of the distal sigmoid colon that may indicate microperforation (coronal images 43, 46, 47). Large amount of colonic stool. --Appendix: Not clearly visualized. VASCULAR/LYMPHATIC: Normal course and caliber of the major abdominal vessels. No abdominal or pelvic lymphadenopathy. REPRODUCTIVE: There is a T-shaped contraceptive device within the uterus. MUSCULOSKELETAL. No bony spinal canal stenosis or focal osseous abnormality. OTHER: None. IMPRESSION: Acute sigmoid colitis with small foci of adjacent gas that may be extraluminal. This may indicate micro perforation. No abscess or drainable fluid collection. Electronically Signed   By: Deatra Robinson M.D.   On: 04/04/2019 23:32   Anti-infectives (From admission, onward)   Start     Dose/Rate Route Frequency Ordered Stop   04/05/19 0600  piperacillin-tazobactam (ZOSYN) IVPB 3.375 g     3.375 g 12.5 mL/hr over 240 Minutes Intravenous Every 8 hours 04/05/19 0121     04/05/19 0000  piperacillin-tazobactam (ZOSYN) IVPB 3.375 g     3.375 g 100 mL/hr over 30 Minutes Intravenous  Once 04/04/19 2355 04/05/19 0119        Assessment/Plan GERD Osteoarthritis - takes meloxicam and tramadol at baseline Recent right ankle surgery by Dr. Victorino Dike about 1 week ago  Sigmoid colitis - no prior history of colitis - never had a colonoscopy before - CT scan 6/3  showed acute sigmoid colitis with small foci of adjacent gas indicative of micro perforation, no abscess or drainable fluid collection  ID - zosyn 6/4>> VTE - SCD, ok for chemical DVT prophylaxis from surgical standpoint FEN - IVF, NPO Foley - none Follow up - TBD  Plan: Continue IVF, bowel rest and IV zosyn. Repeat labs in AM. Once WBC normalizes and abdominal pain improves with plan to advance diet. If this resolves medically advise that Ms. Stevick have a colonoscopy in 6-8 weeks.  Franne Forts, [facility] Surgery 04/05/2019, 8:07 AM Pager: [phone] Mon-Thurs 7:00 am-4:30 pm Fri 7:00 am -11:30 AM Sat-Sun 7:00 am-11:30 am

## 2019-04-05 NOTE — H&P (Addendum)
History and Physical    Molly Beck EKC:[id number] DOB: 1966-06-30 DOA: 04/04/2019  PCP: Clementeen Graham, [name] Patient coming from: Home  Chief Complaint: Abdominal pain  HPI: Molly Beck is a 53 y.o. female with medical history significant of GERD, elective surgery of her right foot a week ago presenting to the hospital presenting to the hospital for evaluation of abdominal pain.  Patient states she had a reconstructive right foot surgery done a week ago.  States since then she has been constipated due to taking pain medications.  Her last bowel movement was prior to the surgery.  For the past few days she is having left lower quadrant abdominal pain which became worse for the past 24 hours.  The pain is sharp and excruciating.  States today she called her surgeon's office and was advised to take magnesium citrate for constipation.  She vomited soon after taking magnesium citrate.  Denies any fevers.  She continues to complain of abdominal pain despite receiving a dose of morphine and multiple doses of fentanyl in the ED.  ED Course: Tachycardic on arrival.  Heart rate improved after IV fluid boluses.  Afebrile.  Not tachypneic or hypotensive.  White count 14.0.  Potassium 3.2.  Magnesium 2.7.  COVID-19 rapid test pending.  CT abdomen pelvis showing acute sigmoid colitis with small foci of adjacent gas that may be extra luminal and may indicate microperforation.  No abscess or drainable fluid collection. Received Zosyn, fentanyl, morphine, Zofran, and 2 L IV fluid boluses.  ED physician spoke to Dr. Carolynne Edouard from general surgery who recommended antibiotic management at this time.  Surgery will consult in a.m.  Review of Systems:  All systems reviewed and apart from history of presenting illness, are negative.  Past Medical History:  Diagnosis Date   Allergy    Anxiety    GERD (gastroesophageal reflux disease)    Headache    migraines   Heartburn    OA (osteoarthritis)    T/O     Recurrent cold sores    Tendon laceration    right  small finger   Wears glasses     Past Surgical History:  Procedure Laterality Date   I&D EXTREMITY Right 05/31/2017   Procedure: Right small finger irrigation and debridement and flexor tendon reconstruction;  Surgeon: Dominica Severin, MD;  Location: [facility] OR;  Service: Orthopedics;  Laterality: Right;   KNEE ARTHROSCOPY     B/L   REPLACEMENT TOTAL KNEE     B/L   TENDON REPAIR     left thumb   WISDOM TOOTH EXTRACTION       reports that she has quit smoking. She has never used smokeless tobacco. She reports current alcohol use. She reports that she does not use drugs.  Allergies  Allergen Reactions   Codeine Nausea Only    Family History  Problem Relation Age of Onset   Macular degeneration Mother    Pancreatic cancer Father    Other Sister     Prior to Admission medications   Medication Sig Start Date End Date Taking? Authorizing Provider  acetaminophen (TYLENOL) 325 MG tablet Take 650 mg by mouth every 6 (six) hours as needed (for back pain/pain.).   Yes [provider]  ALPRAZolam (XANAX) 0.25 MG tablet Take 0.25 mg by mouth 2 (two) times daily as needed for anxiety.   Yes [provider]  ASPIRIN ADULT LOW STRENGTH 81 MG EC tablet Take 81 mg by mouth daily. 03/27/19  Yes [provider]  cetirizine (ZYRTEC) 10 MG tablet Take 10 mg by mouth daily.   Yes [provider]  eletriptan (RELPAX) 40 MG tablet Take 40 mg by mouth as needed for migraine or headache. May repeat in 2 hours if headache persists or recurs.   Yes [provider]  fluticasone (FLONASE) 50 MCG/ACT nasal spray Place 1 spray into both nostrils daily.   Yes [provider]  Homeopathic Products [facility] ALLERGY EYE RELIEF OP) Apply to eye.   Yes [provider]  ibuprofen (ADVIL,MOTRIN) 200 MG tablet Take 600 mg by mouth 2 (two) times daily as needed (for pain.).   Yes [provider]  LACTASE PO Take 5 mg by mouth daily as needed (for milk consumption).   Yes [provider]  meloxicam (MOBIC) 15 MG tablet Take 15 mg by mouth daily as needed for muscle spasms. 03/14/19  Yes [provider]  montelukast (SINGULAIR) 10 MG tablet Take 10 mg by mouth daily at 2 PM. 04/11/17  Yes [provider]  ondansetron (ZOFRAN) 4 MG tablet Take 4 mg by mouth daily as needed for nausea. 03/27/19  Yes [provider]  traMADol (ULTRAM) 50 MG tablet Take 25-50 mg by mouth 3 (three) times daily as needed (for back pain.).   Yes [provider]  valACYclovir (VALTREX) 500 MG tablet Take 500 mg by mouth 2 (two) times daily as needed. For cold sores 03/24/17  Yes [provider]  oxyCODONE (OXY IR/ROXICODONE) 5 MG immediate release tablet Take 5 mg by mouth every 4 (four) hours as needed for pain. 03/27/19   [provider]    Physical Exam: Vitals:   04/05/19 0000 04/05/19 0030 04/05/19 0033 04/05/19 0051  BP: (!) 132/100 (!) 131/100 (!) 131/100 (!) 141/93  Pulse: (!) 121 (!) 116 (!) 109 (!) 101  Resp: (!) 21 (!) 24 (!) 21 (!) 22  Temp:      TempSrc:      SpO2: 96% 95% 95% 94%    Physical Exam  Constitutional: She is oriented to person, place, and time. She appears well-developed and well-nourished. She appears distressed.  Distress secondary to abdominal pain  HENT:  Head: Normocephalic.  Dry mucous membranes  Eyes: Right eye exhibits no discharge. Left eye exhibits no discharge.  Neck: Neck supple.  Cardiovascular: Regular rhythm and intact distal pulses.  Slightly tachycardic  Pulmonary/Chest: Effort normal and breath sounds normal. No respiratory distress. She has no wheezes. She has no rales.  Abdominal: Soft. Bowel sounds are normal. She exhibits no distension. There is abdominal tenderness. There is guarding. There is no rebound.  Left lower quadrant tender to palpation with guarding  Musculoskeletal:      Comments: Right lower extremity in a cast  Neurological: She is alert and oriented to person, place, and time.  Skin: Skin is warm. No erythema.     Labs on Admission: I have personally reviewed following labs and imaging studies  CBC: Recent Labs  Lab 04/04/19 2100  WBC 14.0*  NEUTROABS 12.3*  HGB 13.7  HCT 40.7  MCV 91.3  PLT 380   Basic Metabolic Panel: Recent Labs  Lab 04/04/19 2100  NA 140  K 3.2*  CL 103  CO2 24  GLUCOSE 149*  BUN 17  CREATININE 0.67  CALCIUM 9.1  MG 2.7*   GFR: CrCl cannot be calculated (Unknown ideal weight.). Liver Function Tests: Recent Labs  Lab 04/04/19 2100  AST 20  ALT 18  ALKPHOS 101  BILITOT 0.8  PROT 7.7  ALBUMIN 4.2   No results for input(s): LIPASE, AMYLASE in the last 168 hours. No results for input(s): AMMONIA in the last 168 hours. Coagulation Profile: No results for input(s): INR, PROTIME in the last 168 hours. Cardiac Enzymes: No results for input(s): CKTOTAL, CKMB, CKMBINDEX, TROPONINI in the last 168 hours. BNP (last 3 results) No results for input(s): PROBNP in the last 8760 hours. HbA1C: No results for input(s): HGBA1C in the last 72 hours. CBG: No results for input(s): GLUCAP in the last 168 hours. Lipid Profile: No results for input(s): CHOL, HDL, LDLCALC, TRIG, CHOLHDL, LDLDIRECT in the last 72 hours. Thyroid Function Tests: No results for input(s): TSH, T4TOTAL, FREET4, T3FREE, THYROIDAB in the last 72 hours. Anemia Panel: No results for input(s): VITAMINB12, FOLATE, FERRITIN, TIBC, IRON, RETICCTPCT in the last 72 hours. Urine analysis:    Component Value Date/Time   COLORURINE YELLOW 09/12/2015 1750   APPEARANCEUR CLOUDY (A) 09/12/2015 1750   LABSPEC 1.018 09/12/2015 1750   PHURINE 6.5 09/12/2015 1750   GLUCOSEU NEGATIVE 09/12/2015 1750   HGBUR MODERATE (A) 09/12/2015 1750   BILIRUBINUR NEGATIVE 09/12/2015 1750   KETONESUR 40 (A) 09/12/2015 1750   PROTEINUR NEGATIVE 09/12/2015 1750    UROBILINOGEN 0.2 09/12/2015 1750   NITRITE NEGATIVE 09/12/2015 1750   LEUKOCYTESUR NEGATIVE 09/12/2015 1750    Radiological Exams on Admission: Ct Abdomen Pelvis W Contrast  Result Date: 04/04/2019 CLINICAL DATA:  Abdominal pain and decreased bowel movement since surgery EXAM: CT ABDOMEN AND PELVIS WITH CONTRAST TECHNIQUE: Multidetector CT imaging of the abdomen and pelvis was performed using the standard protocol following bolus administration of intravenous contrast. CONTRAST:  100mL OMNIPAQUE IOHEXOL 300 MG/ML  SOLN COMPARISON:  None. FINDINGS: LOWER CHEST: There is no basilar pleural or apical pericardial effusion. HEPATOBILIARY: The hepatic contours and density are normal. There is no intra- or extrahepatic biliary dilatation. The gallbladder is normal. PANCREAS: The pancreatic parenchymal contours are normal and there is no ductal dilatation. There is no peripancreatic fluid collection. SPLEEN: Normal. ADRENALS/URINARY TRACT: --Adrenal glands: Normal. --Right kidney/ureter: No hydronephrosis, nephroureterolithiasis, perinephric stranding or solid renal mass. --Left kidney/ureter: No hydronephrosis, nephroureterolithiasis, perinephric stranding or solid renal mass. --Urinary bladder: Normal for degree of distention STOMACH/BOWEL: --Stomach/Duodenum: There is no hiatal hernia or other gastric abnormality. The duodenal course and caliber are normal. --Small bowel: No dilatation or inflammation. --Colon: There is mild inflammation of the sigmoid colon. There are small foci of gas along the anterior contour of the distal sigmoid colon that may indicate microperforation (coronal images 43, 46, 47). Large amount of colonic stool. --Appendix: Not clearly visualized. VASCULAR/LYMPHATIC: Normal course and caliber of the major abdominal vessels. No abdominal or pelvic lymphadenopathy. REPRODUCTIVE: There is a T-shaped contraceptive device within the uterus. MUSCULOSKELETAL. No bony spinal canal stenosis or focal  osseous abnormality. OTHER: None. IMPRESSION: Acute sigmoid colitis with small foci of adjacent gas that may be extraluminal. This may indicate micro perforation. No abscess or drainable fluid collection. Electronically Signed   By: Deatra RobinsonKevin  Herman M.D.   On: 04/04/2019 23:32   Assessment/Plan Principal Problem:   Colitis Active Problems:   Sepsis (HCC)   Hypokalemia   H/O foot surgery   Sepsis secondary to acute sigmoid colitis complicated by microperforation Tachycardic.  Afebrile.  Not tachypneic or hypotensive.  White count 14.0.  CT abdomen pelvis showing acute sigmoid colitis with small foci of adjacent gas that may be extra luminal and may indicate microperforation.  No  abscess or drainable fluid collection. Patient continues to complain of abdominal pain despite receiving a dose of morphine and several doses of fentanyl in the ED. Pain also likely contributing to her tachycardia. -ED physician spoke to Dr. Carolynne Edouard from general surgery who recommended antibiotic management at this time.  Surgery will consult in a.m. -Received 2 L IV fluid boluses in the ED.  Give another 500 cc IV fluid bolus to meet 30 cc/kg per sepsis protocol. Then continue normal saline at 150 cc/h. -Dilaudid 1 mg every 3 hours as needed -Continue Zosyn -IV Zofran PRN -Check lactic acid level -Continue to monitor CBC -No blood cultures drawn in the ED.  Will order at this time. -Keep n.p.o.  Mild hypokalemia Likely secondary to decreased p.o. intake. Potassium 3.2.  Magnesium 2.7.   -Replete potassium and continue to monitor  Recent right foot surgery Right lower extremity currently in a cast. -Pain management as above -Outpatient orthopedic follow-up  DVT prophylaxis: SCDs Code Status: Full code Family Communication: No family available. Disposition Plan: Anticipate discharge after clinical improvement. Consults called: General surgery Admission status: It is my clinical opinion that admission to INPATIENT  is reasonable and necessary in this 53 y.o. female  presenting with symptoms of left lower quadrant abdominal pain and emesis, concerning for sepsis secondary to acute sigmoid colitis complicated by microperforation  with pertinent positives on physical exam including: Tachycardia, left lower quadrant abdominal pain and guarding  and pertinent positives on radiographic and laboratory data including: Leukocytosis.  Imaging with evidence of acute sigmoid colitis with microperforation.  Workup and treatment include IV fluid hydration, IV Zosyn, IV antiemetic, and IV Dilaudid.  General surgery will see the patient in the morning.  Given the aforementioned, the predictability of an adverse outcome is felt to be significant. I expect that the patient will require at least 2 midnights in the hospital to treat this condition.   The medical decision making on this patient was of high complexity and the patient is at high risk for clinical deterioration, therefore this is a level 3 visit.  John Giovanni MD [facility] Pager [phone]  If 7PM-7AM, please contact night-coverage [email or web address] Password [email or web address]  04/05/2019, 1:18 AM

## 2019-04-05 NOTE — ED Notes (Signed)
Patient states that the pain medication worked well, but she feels as if it is not lasting long enough. She is having sharp, cramping spasms in her abdomen about every 15 minutes. MD made aware.

## 2019-04-06 LAB — CBC
HCT: 33.1 % — ABNORMAL LOW (ref 36.0–46.0)
Hemoglobin: 10.7 g/dL — ABNORMAL LOW (ref 12.0–15.0)
MCH: 31.4 pg (ref 26.0–34.0)
MCHC: 32.3 g/dL (ref 30.0–36.0)
MCV: 97.1 fL (ref 80.0–100.0)
Platelets: 283 10*3/uL (ref 150–400)
RBC: 3.41 MIL/uL — ABNORMAL LOW (ref 3.87–5.11)
RDW: 13.1 % (ref 11.5–15.5)
WBC: 12.3 10*3/uL — ABNORMAL HIGH (ref 4.0–10.5)
nRBC: 0 % (ref 0.0–0.2)

## 2019-04-06 LAB — URINALYSIS, ROUTINE W REFLEX MICROSCOPIC
Bacteria, UA: NONE SEEN
Bilirubin Urine: NEGATIVE
Glucose, UA: NEGATIVE mg/dL
Ketones, ur: 20 mg/dL — AB
Nitrite: NEGATIVE
Protein, ur: NEGATIVE mg/dL
Specific Gravity, Urine: 1.025 (ref 1.005–1.030)
pH: 5 (ref 5.0–8.0)

## 2019-04-06 LAB — BASIC METABOLIC PANEL
Anion gap: 5 (ref 5–15)
BUN: 16 mg/dL (ref 6–20)
CO2: 23 mmol/L (ref 22–32)
Calcium: 7.8 mg/dL — ABNORMAL LOW (ref 8.9–10.3)
Chloride: 112 mmol/L — ABNORMAL HIGH (ref 98–111)
Creatinine, Ser: 0.48 mg/dL (ref 0.44–1.00)
GFR calc Af Amer: >60 mL/min (ref >60)
GFR calc non Af Amer: >60 mL/min (ref >60)
Glucose, Bld: 105 mg/dL — ABNORMAL HIGH (ref 70–99)
Potassium: 3.5 mmol/L (ref 3.5–5.1)
Sodium: 140 mmol/L (ref 135–145)

## 2019-04-06 MED ORDER — SODIUM CHLORIDE 0.9 % IV SOLN
INTRAVENOUS | Status: DC
  Administered 2019-04-06 – 2019-04-09 (×5): via INTRAVENOUS

## 2019-04-06 MED ORDER — SIMETHICONE 80 MG PO CHEW
80.0000 mg | CHEWABLE_TABLET | Freq: Once | ORAL | Status: AC
  Administered 2019-04-06: 80 mg via ORAL
  Filled 2019-04-06: qty 1

## 2019-04-06 NOTE — Progress Notes (Signed)
Subjective/Chief Complaint: Pt with less pain and more soreness No BMs   Objective: Vital signs in last 24 hours: Temp:  [97.9 F (36.6 C)-100.4 F (38 C)] 100.4 F (38 C) (06/05 0514) Pulse Rate:  [103-127] 127 (06/05 0514) Resp:  [18-19] 18 (06/05 0514) BP: (121-137)/(52-81) 137/81 (06/05 0514) SpO2:  [94 %-99 %] 94 % (06/05 0514) Weight:  [79.9 kg] 79.9 kg (06/04 0953) Last BM Date: 04/05/19  Intake/Output from previous day: 06/04 0701 - 06/05 0700 In: 3255.1 [P.O.:360; I.V.:2450.2; IV Piggyback:445] Out: 450 [Urine:450] Intake/Output this shift: Total I/O In: -  Out: 225 [Urine:225]  Constitutional: No acute distress, conversant, appears states age. Eyes: Anicteric sclerae, moist conjunctiva, no lid lag Lungs: Clear to auscultation bilaterally, normal respiratory effort CV: regular rate and rhythm, no murmurs, no peripheral edema, pedal pulses 2+ GI: Soft, no masses or hepatosplenomegaly, tender to palpation suprapubic area, no rebound/guarding Skin: No rashes, palpation reveals normal turgor Psychiatric: appropriate judgment and insight, oriented to person, place, and time   Lab Results:  Recent Labs    04/05/19 0608 04/06/19 0605  WBC 11.7* 12.3*  HGB 11.9* 10.7*  HCT 36.5 33.1*  PLT 319 283   BMET Recent Labs    04/05/19 0608 04/06/19 0605  NA 141 140  K 4.1 3.5  CL 110 112*  CO2 22 23  GLUCOSE 133* 105*  BUN 15 16  CREATININE 0.68 0.48  CALCIUM 8.1* 7.8*    Studies/Results: Ct Abdomen Pelvis W Contrast  Result Date: 04/04/2019 CLINICAL DATA:  Abdominal pain and decreased bowel movement since surgery EXAM: CT ABDOMEN AND PELVIS WITH CONTRAST TECHNIQUE: Multidetector CT imaging of the abdomen and pelvis was performed using the standard protocol following bolus administration of intravenous contrast. CONTRAST:  100mL OMNIPAQUE IOHEXOL 300 MG/ML  SOLN COMPARISON:  None. FINDINGS: LOWER CHEST: There is no basilar pleural or apical pericardial  effusion. HEPATOBILIARY: The hepatic contours and density are normal. There is no intra- or extrahepatic biliary dilatation. The gallbladder is normal. PANCREAS: The pancreatic parenchymal contours are normal and there is no ductal dilatation. There is no peripancreatic fluid collection. SPLEEN: Normal. ADRENALS/URINARY TRACT: --Adrenal glands: Normal. --Right kidney/ureter: No hydronephrosis, nephroureterolithiasis, perinephric stranding or solid renal mass. --Left kidney/ureter: No hydronephrosis, nephroureterolithiasis, perinephric stranding or solid renal mass. --Urinary bladder: Normal for degree of distention STOMACH/BOWEL: --Stomach/Duodenum: There is no hiatal hernia or other gastric abnormality. The duodenal course and caliber are normal. --Small bowel: No dilatation or inflammation. --Colon: There is mild inflammation of the sigmoid colon. There are small foci of gas along the anterior contour of the distal sigmoid colon that may indicate microperforation (coronal images 43, 46, 47). Large amount of colonic stool. --Appendix: Not clearly visualized. VASCULAR/LYMPHATIC: Normal course and caliber of the major abdominal vessels. No abdominal or pelvic lymphadenopathy. REPRODUCTIVE: There is a T-shaped contraceptive device within the uterus. MUSCULOSKELETAL. No bony spinal canal stenosis or focal osseous abnormality. OTHER: None. IMPRESSION: Acute sigmoid colitis with small foci of adjacent gas that may be extraluminal. This may indicate micro perforation. No abscess or drainable fluid collection. Electronically Signed   By: Deatra RobinsonKevin  Herman M.D.   On: 04/04/2019 23:32    Anti-infectives: Anti-infectives (From admission, onward)   Start     Dose/Rate Route Frequency Ordered Stop   04/05/19 0600  piperacillin-tazobactam (ZOSYN) IVPB 3.375 g     3.375 g 12.5 mL/hr over 240 Minutes Intravenous Every 8 hours 04/05/19 0121     04/05/19 0000  piperacillin-tazobactam (ZOSYN) IVPB 3.375  g     3.375 g 100 mL/hr  over 30 Minutes Intravenous  Once 04/04/19 2355 04/05/19 0119      Assessment/Plan: GERD Osteoarthritis - takes meloxicam and tramadol at baseline Recent right ankle surgery by Dr. Victorino Dike about 1 week ago  Sigmoid colitis - no prior history of colitis - never had a colonoscopy before - CT scan 6/3 showed acute sigmoid colitis with small foci of adjacent gas indicative of micro perforation, no abscess or drainable fluid collection  ID - zosyn 6/4>> VTE - SCD, ok for chemical DVT prophylaxis from surgical standpoint FEN - IVF, NPO Foley - none Follow up - TBD  Plan: Continue IVF, bowel rest and IV zosyn.  If con't to do well will start clears Sat AM as tol    LOS: 1 day    Axel Filler 04/06/2019

## 2019-04-06 NOTE — Progress Notes (Signed)
PROGRESS NOTE    Molly Beck  IHK:[id number] DOB: [birth year] DOA: 04/04/2019 PCP: Clementeen Graham, [name]     Brief Narrative:  Molly Beck is a 53 y.o. female with medical history significant of GERD, elective surgery of her right foot a week ago presenting to the hospital for evaluation of abdominal pain.  Patient states she had a reconstructive right foot surgery done a week ago.  States since then she has been constipated due to taking pain medications.  Her last bowel movement was prior to the surgery.  For the past few days she is having left lower quadrant abdominal pain which became worse for the past 24 hours.  The pain is sharp and excruciating.  States today she called her surgeon's office and was advised to take magnesium citrate for constipation.  She vomited soon after taking magnesium citrate.  Denies any fevers.  She continues to complain of abdominal pain despite receiving a dose of morphine and multiple doses of fentanyl in the ED. CT abdomen pelvis showing acute sigmoid colitis with small foci of adjacent gas that may be extra luminal and may indicate microperforation.  No abscess or drainable fluid collection. She was admitted for further treatment and care.  General surgery was consulted.  New events last 24 hours / Subjective: Feeling okay this morning, continues to have left lower quadrant abdominal soreness, passing gas.  Fever overnight 100.4, remains tachycardic  Assessment & Plan:   Principal Problem:   Colitis Active Problems:   Sepsis (HCC)   Hypokalemia   H/O foot surgery   Sepsis secondary to acute sigmoid colitis with microperforation -Appreciate general surgery consultation -Continue IV fluid -Continue IV Zosyn -Fever overnight, WBC increased slightly to 12.3 -Blood cultures negative growth to date  Recent right foot surgery -Currently in a cast, plans to follow-up with her orthopedic surgeon as an outpatient     DVT prophylaxis:  SCD Code Status: Full Family Communication: None Disposition Plan: Pending improvement in her WBC, fever curve, diet advancement and general surgery recommendation   Consultants:   General surgery  Procedures:   None  Antimicrobials:  Anti-infectives (From admission, onward)   Start     Dose/Rate Route Frequency Ordered Stop   04/05/19 0600  piperacillin-tazobactam (ZOSYN) IVPB 3.375 g     3.375 g 12.5 mL/hr over 240 Minutes Intravenous Every 8 hours 04/05/19 0121     04/05/19 0000  piperacillin-tazobactam (ZOSYN) IVPB 3.375 g     3.375 g 100 mL/hr over 30 Minutes Intravenous  Once 04/04/19 2355 04/05/19 0119        Objective: Vitals:   04/05/19 2125 04/06/19 0514 04/06/19 0747 04/06/19 0933  BP: 121/81 137/81 117/81 139/86  Pulse: (!) 120 (!) 127 (!) 115 (!) 110  Resp: Temp: 99.8 F (37.7 C) (!) 100.4 F (38 C) 98.5 F (36.9 C) 99.5 F (37.5 C)  TempSrc: Oral Oral Oral Oral  SpO2: 96% 94% 95% 97%  Weight:      Height:        Intake/Output Summary (Last 24 hours) at 04/06/2019 1132 Last data filed at 04/06/2019 0720 Gross per 24 hour  Intake 2355.32 ml  Output 675 ml  Net 1680.32 ml   Filed Weights   04/05/19 0953  Weight: 79.9 kg    Examination:  General exam: Appears calm and comfortable  Respiratory system: Clear to auscultation. Respiratory effort normal. Cardiovascular system: S1 & S2 heard, tachycardic, regular rhythm. No JVD, murmurs, rubs,  gallops or clicks. No pedal edema. Gastrointestinal system: Abdomen is nondistended, soft and tender to palpation left lower quadrant, bowel sounds present Central nervous system: Alert and oriented. No focal neurological deficits. Extremities: Right lower extremity in cast Skin: No rashes, lesions or ulcers Psychiatry: Judgement and insight appear normal. Mood & affect appropriate.   Data Reviewed: I have personally reviewed following labs and imaging studies  CBC: Recent Labs  Lab  04/04/19 2100 04/05/19 0608 04/06/19 0605  WBC 14.0* 11.7* 12.3*  NEUTROABS 12.3* 10.0*  --   HGB 13.7 11.9* 10.7*  HCT 40.7 36.5 33.1*  MCV 91.3 94.3 97.1  PLT 380 319 283   Basic Metabolic Panel: Recent Labs  Lab 04/04/19 2100 04/05/19 0608 04/06/19 0605  NA 140 141 140  K 3.2* 4.1 3.5  CL 103 110 112*  CO2 24 22 23   GLUCOSE 149* 133* 105*  BUN 17 15 16   CREATININE 0.67 0.68 0.48  CALCIUM 9.1 8.1* 7.8*  MG 2.7* 2.3  --    GFR: Estimated Creatinine Clearance: 84.2 mL/min (by C-G formula based on SCr of 0.48 mg/dL). Liver Function Tests: Recent Labs  Lab 04/04/19 2100  AST 20  ALT 18  ALKPHOS 101  BILITOT 0.8  PROT 7.7  ALBUMIN 4.2   No results for input(s): LIPASE, AMYLASE in the last 168 hours. No results for input(s): AMMONIA in the last 168 hours. Coagulation Profile: No results for input(s): INR, PROTIME in the last 168 hours. Cardiac Enzymes: No results for input(s): CKTOTAL, CKMB, CKMBINDEX, TROPONINI in the last 168 hours. BNP (last 3 results) No results for input(s): PROBNP in the last 8760 hours. HbA1C: No results for input(s): HGBA1C in the last 72 hours. CBG: No results for input(s): GLUCAP in the last 168 hours. Lipid Profile: No results for input(s): CHOL, HDL, LDLCALC, TRIG, CHOLHDL, LDLDIRECT in the last 72 hours. Thyroid Function Tests: No results for input(s): TSH, T4TOTAL, FREET4, T3FREE, THYROIDAB in the last 72 hours. Anemia Panel: No results for input(s): VITAMINB12, FOLATE, FERRITIN, TIBC, IRON, RETICCTPCT in the last 72 hours. Sepsis Labs: Recent Labs  Lab 04/05/19 0253  LATICACIDVEN 1.2    Recent Results (from the past 240 hour(s))  SARS Coronavirus 2 (CEPHEID - Performed in [facility] hospital lab), Hosp Order     Status: None   Collection Time: 04/05/19 12:14 AM  Result Value Ref Range Status   SARS Coronavirus 2 NEGATIVE NEGATIVE Final    Comment: (NOTE) If result is NEGATIVE SARS-CoV-2 target nucleic acids are NOT  DETECTED. The SARS-CoV-2 RNA is generally detectable in upper and lower  respiratory specimens during the acute phase of infection. The lowest  concentration of SARS-CoV-2 viral copies this assay can detect is 250  copies / mL. A negative result does not preclude SARS-CoV-2 infection  and should not be used as the sole basis for treatment or other  patient management decisions.  A negative result may occur with  improper specimen collection / handling, submission of specimen other  than nasopharyngeal swab, presence of viral mutation(s) within the  areas targeted by this assay, and inadequate number of viral copies  (<250 copies / mL). A negative result must be combined with clinical  observations, patient history, and epidemiological information. If result is POSITIVE SARS-CoV-2 target nucleic acids are DETECTED. The SARS-CoV-2 RNA is generally detectable in upper and lower  respiratory specimens dur ing the acute phase of infection.  Positive  results are indicative of active infection with SARS-CoV-2.  Clinical  correlation with patient history and other diagnostic information is  necessary to determine patient infection status.  Positive results do  not rule out bacterial infection or co-infection with other viruses. If result is PRESUMPTIVE POSTIVE SARS-CoV-2 nucleic acids MAY BE PRESENT.   A presumptive positive result was obtained on the submitted specimen  and confirmed on repeat testing.  While 2019 novel coronavirus  (SARS-CoV-2) nucleic acids may be present in the submitted sample  additional confirmatory testing may be necessary for epidemiological  and / or clinical management purposes  to differentiate between  SARS-CoV-2 and other Sarbecovirus currently known to infect humans.  If clinically indicated additional testing with an alternate test  methodology [phone]) is advised. The SARS-CoV-2 RNA is generally  detectable in upper and lower respiratory sp ecimens during  the acute  phase of infection. The expected result is Negative. Fact Sheet for Patients:  BoilerBrush.com.cy Fact Sheet for Healthcare Providers: https://pope.com/ This test is not yet approved or cleared by the [place] FDA and has been authorized for detection and/or diagnosis of SARS-CoV-2 by FDA under an Emergency Use Authorization (EUA).  This EUA will remain in effect (meaning this test can be used) for the duration of the COVID-19 declaration under Section 564(b)(1) of the Act, 21 U.S.C. section 360bbb-3(b)(1), unless the authorization is terminated or revoked sooner. Performed at [facility], [address], [place], [place] [address]   Culture, blood (routine x 2)     Status: None (Preliminary result)   Collection Time: 04/05/19  1:10 AM  Result Value Ref Range Status   Specimen Description   Final    BLOOD BLOOD RIGHT HAND Performed at [facility], [address] [place], [place] [address]    Special Requests   Final    BOTTLES DRAWN AEROBIC AND ANAEROBIC Blood Culture results may not be optimal due to an excessive volume of blood received in culture bottles Performed at [facility], [address], [place], [place] [address]    Culture   Final    NO GROWTH 1 DAY Performed at [facility], [address] [place], [place] [address]    Report Status PENDING  Incomplete  Culture, blood (routine x 2)     Status: None (Preliminary result)   Collection Time: 04/05/19  2:53 AM  Result Value Ref Range Status   Specimen Description   Final    BLOOD RIGHT ANTECUBITAL Performed at [facility], [address] [place], [place] [address]    Special Requests   Final    BOTTLES DRAWN AEROBIC AND ANAEROBIC Blood Culture results may not be optimal due to an excessive volume of blood received in culture bottles Performed at [facility], [address], [place], [place] [address]    Culture   Final    NO GROWTH 1 DAY Performed at [facility], [address] [place], [place] [address]    Report Status PENDING  Incomplete       Radiology Studies: Ct Abdomen Pelvis W Contrast  Result Date: 04/04/2019 CLINICAL DATA:  Abdominal pain and decreased bowel movement since surgery EXAM: CT ABDOMEN AND PELVIS WITH CONTRAST TECHNIQUE: Multidetector CT imaging of the abdomen and pelvis was performed using the standard protocol following bolus administration of intravenous contrast. CONTRAST:  OMNIPAQUE IOHEXOL 300 MG/ML  SOLN COMPARISON:  None. FINDINGS: LOWER CHEST: There is no basilar pleural or apical pericardial effusion. HEPATOBILIARY: The hepatic  contours and density are normal. There is no intra- or extrahepatic biliary dilatation. The gallbladder is normal. PANCREAS: The pancreatic parenchymal contours are normal and there is no ductal dilatation. There is no peripancreatic fluid collection. SPLEEN: Normal. ADRENALS/URINARY TRACT: --Adrenal glands: Normal. --Right kidney/ureter: No hydronephrosis, nephroureterolithiasis, perinephric stranding or solid renal mass. --Left kidney/ureter: No hydronephrosis, nephroureterolithiasis, perinephric stranding or solid renal mass. --Urinary bladder: Normal for degree of distention STOMACH/BOWEL: --Stomach/Duodenum: There is no hiatal hernia or other gastric abnormality. The duodenal course and caliber are normal. --Small bowel: No dilatation or inflammation. --Colon: There is mild inflammation of the sigmoid colon. There are small foci of gas along the anterior contour of the distal sigmoid colon that may indicate microperforation (coronal images 43, 46, 47). Large amount of colonic stool. --Appendix: Not clearly visualized. VASCULAR/LYMPHATIC: Normal course and caliber of the major abdominal vessels. No abdominal or pelvic lymphadenopathy. REPRODUCTIVE: There is a T-shaped  contraceptive device within the uterus. MUSCULOSKELETAL. No bony spinal canal stenosis or focal osseous abnormality. OTHER: None. IMPRESSION: Acute sigmoid colitis with small foci of adjacent gas that may be extraluminal. This may indicate micro perforation. No abscess or drainable fluid collection. Electronically Signed   By: Deatra RobinsonKevin  Herman M.D.   On: 04/04/2019 23:32      Scheduled Meds:  fluticasone  1 spray Each Nare Daily   Continuous Infusions:  sodium chloride Stopped (04/05/19 1756)   sodium chloride 75 mL/hr at 04/06/19 1035   methocarbamol (ROBAXIN) IV     piperacillin-tazobactam (ZOSYN)  IV 3.375 g (04/06/19 0532)     LOS: 1 day    Time spent: 35 minutes   Noralee StainJennifer Terran Hollenkamp, DO [facility] [email or web address] 04/06/2019, 11:32 AM

## 2019-04-07 LAB — BASIC METABOLIC PANEL
Anion gap: 10 (ref 5–15)
BUN: 11 mg/dL (ref 6–20)
CO2: 19 mmol/L — ABNORMAL LOW (ref 22–32)
Calcium: 7.5 mg/dL — ABNORMAL LOW (ref 8.9–10.3)
Chloride: 109 mmol/L (ref 98–111)
Creatinine, Ser: 0.53 mg/dL (ref 0.44–1.00)
GFR calc Af Amer: >60 mL/min (ref >60)
GFR calc non Af Amer: >60 mL/min (ref >60)
Glucose, Bld: 84 mg/dL (ref 70–99)
Potassium: 2.9 mmol/L — ABNORMAL LOW (ref 3.5–5.1)
Sodium: 138 mmol/L (ref 135–145)

## 2019-04-07 LAB — CBC
HCT: 29.8 % — ABNORMAL LOW (ref 36.0–46.0)
Hemoglobin: 9.7 g/dL — ABNORMAL LOW (ref 12.0–15.0)
MCH: 31 pg (ref 26.0–34.0)
MCHC: 32.6 g/dL (ref 30.0–36.0)
MCV: 95.2 fL (ref 80.0–100.0)
Platelets: 245 10*3/uL (ref 150–400)
RBC: 3.13 MIL/uL — ABNORMAL LOW (ref 3.87–5.11)
RDW: 12.8 % (ref 11.5–15.5)
WBC: 12 10*3/uL — ABNORMAL HIGH (ref 4.0–10.5)
nRBC: 0 % (ref 0.0–0.2)

## 2019-04-07 LAB — MAGNESIUM: Magnesium: 1.9 mg/dL (ref 1.7–2.4)

## 2019-04-07 MED ORDER — SIMETHICONE 80 MG PO CHEW: 80.0000 mg | CHEWABLE_TABLET | Freq: Four times a day (QID) | ORAL | Status: DC | PRN

## 2019-04-07 MED ORDER — POTASSIUM CHLORIDE 10 MEQ/100ML IV SOLN
10.0000 meq | INTRAVENOUS | Status: AC
  Administered 2019-04-07 (×3): 10 meq via INTRAVENOUS
  Filled 2019-04-07 (×3): qty 100

## 2019-04-07 MED ORDER — POTASSIUM CHLORIDE 10 MEQ/100ML IV SOLN
10.0000 meq | Freq: Once | INTRAVENOUS | Status: AC
  Administered 2019-04-07: 10 meq via INTRAVENOUS
  Filled 2019-04-07: qty 100

## 2019-04-07 MED ORDER — ENOXAPARIN SODIUM 40 MG/0.4ML ~~LOC~~ SOLN
40.0000 mg | SUBCUTANEOUS | Status: DC
  Administered 2019-04-07 – 2019-04-09 (×3): 40 mg via SUBCUTANEOUS
  Filled 2019-04-07 (×3): qty 0.4

## 2019-04-07 NOTE — Progress Notes (Signed)
   Subjective/Chief Complaint: PT doing well this AM Less pain No Bms/+flatus   Objective: Vital signs in last 24 hours: Temp:  [98.1 F (36.7 C)-100.4 F (38 C)] 99.4 F (37.4 C) (06/06 0549) Pulse Rate:  [110-124] 124 (06/06 0549) Resp:  [16-20] 18 (06/06 0549) BP: (128-150)/(76-99) 128/76 (06/06 0549) SpO2:  [92 %-100 %] 92 % (06/06 0549) Last BM Date: 04/05/19  Intake/Output from previous day: 06/05 0701 - 06/06 0700 In: 1924.4 [P.O.:180; I.V.:1603.1; IV Piggyback:141.3] Out: 225 [Urine:225] Intake/Output this shift: No intake/output data recorded.   Constitutional: No acute distress, conversant, appears states age. Eyes: Anicteric sclerae, moist conjunctiva, no lid lag Lungs: Clear to auscultation bilaterally, normal respiratory effort CV: regular rate and rhythm, no murmurs, no peripheral edema, pedal pulses 2+ GI: Soft, no masses or hepatosplenomegaly, min-tender to palpation in suprapubic area, no rebound/guarding Skin: No rashes, palpation reveals normal turgor Psychiatric: appropriate judgment and insight, oriented to person, place, and time   Lab Results:  Recent Labs    04/06/19 0605 04/07/19 0600  WBC 12.3* 12.0*  HGB 10.7* 9.7*  HCT 33.1* 29.8*  PLT 283 245   BMET Recent Labs    04/06/19 0605 04/07/19 0600  NA 140 138  K 3.5 2.9*  CL 112* 109  CO2 23 19*  GLUCOSE 105* 84  BUN 16 11  CREATININE 0.48 0.53  CALCIUM 7.8* 7.5*   Anti-infectives: Anti-infectives (From admission, onward)   Start     Dose/Rate Route Frequency Ordered Stop   04/05/19 0600  piperacillin-tazobactam (ZOSYN) IVPB 3.375 g     3.375 g 12.5 mL/hr over 240 Minutes Intravenous Every 8 hours 04/05/19 0121     04/05/19 0000  piperacillin-tazobactam (ZOSYN) IVPB 3.375 g     3.375 g 100 mL/hr over 30 Minutes Intravenous  Once 04/04/19 2355 04/05/19 0119      Assessment/Plan: GERD Osteoarthritis - takes meloxicam and tramadol at baseline Recent right ankle surgery  by Dr. Doran Durand about 1 week ago  Sigmoid colitis - no prior history of colitis - never had a colonoscopy before - CT scan 6/3 showed acute sigmoid colitis with small foci of adjacent gas indicative of micro perforation, no abscess or drainable fluid collection  ID - zosyn 6/4>> VTE - SCD, ok for chemical DVT prophylaxis from surgical standpoint FEN - IVF, CLD Foley - none Follow up - TBD  Plan: Continue IVF and IV zosyn.  Will start CLD today, go slow Will consult PT to help with mobilzation    LOS: 2 days    Ralene Ok 04/07/2019

## 2019-04-07 NOTE — Progress Notes (Signed)
PROGRESS NOTE    Molly Beck  POE:[id number] DOB: 03-Jul-1966 DOA: 04/04/2019 PCP: Maude Leriche, [name]     Brief Narrative:  Molly Beck is a 53 y.o. female with medical history significant of GERD, elective surgery of her right foot a week ago presenting to the hospital for evaluation of abdominal pain.  Patient states she had a reconstructive right foot surgery done a week ago.  States since then she has been constipated due to taking pain medications.  Her last bowel movement was prior to the surgery.  For the past few days she is having left lower quadrant abdominal pain which became worse for the past 24 hours.  The pain is sharp and excruciating.  States today she called her surgeon's office and was advised to take magnesium citrate for constipation.  She vomited soon after taking magnesium citrate.  Denies any fevers.  She continues to complain of abdominal pain despite receiving a dose of morphine and multiple doses of fentanyl in the ED. CT abdomen pelvis showing acute sigmoid colitis with small foci of adjacent gas that may be extra luminal and may indicate microperforation.  No abscess or drainable fluid collection. She was admitted for further treatment and care.  General surgery was consulted.  New events last 24 hours / Subjective: Not feeling well overall, had another fever overnight 100.4, having some lower/pelvic abdominal pain that feels different from her initial presenting to him.  Assessment & Plan:   Principal Problem:   Colitis Active Problems:   Sepsis ([place])   Hypokalemia   H/O foot surgery   Sepsis secondary to acute sigmoid colitis with microperforation -Sepsis present on admission -Appreciate general surgery consultation -Continue IV fluid -Continue IV Zosyn -Fever overnight, WBC now 12 -Blood cultures negative growth to date -Clear liquids started by general surgery, continue to monitor WBC as well as fever curve  Recent right foot surgery  -Currently in a cast, plans to follow-up with her orthopedic surgeon as an outpatient -PT evaluation, nonweightbearing right lower extremity  Hypokalemia -Magnesium 1.9 -Replace potassium and trend   DVT prophylaxis: Lovenox Code Status: Full Family Communication: None Disposition Plan: Pending improvement in her WBC, fever curve, diet advancement and general surgery recommendation   Consultants:   General surgery  Procedures:   None  Antimicrobials:  Anti-infectives (From admission, onward)   Start     Dose/Rate Route Frequency Ordered Stop   04/05/19 0600  piperacillin-tazobactam (ZOSYN) IVPB 3.375 g     3.375 g 12.5 mL/hr over 240 Minutes Intravenous Every 8 hours 04/05/19 0121     04/05/19 0000  piperacillin-tazobactam (ZOSYN) IVPB 3.375 g     3.375 g 100 mL/hr over 30 Minutes Intravenous  Once 04/04/19 2355 04/05/19 0119       Objective: Vitals:   04/06/19 1706 04/06/19 2119 04/07/19 0046 04/07/19 0549  BP: 137/81 135/88 (!) 150/99 128/76  Pulse: (!) 117 (!) 122 (!) 123 (!) 124  Resp: 20 20 18 18   Temp: 98.1 F (36.7 C) 100.3 F (37.9 C) (!) 100.4 F (38 C) 99.4 F (37.4 C)  TempSrc: Oral Oral Oral Oral  SpO2: 100% 98% 97% 92%  Weight:      Height:        Intake/Output Summary (Last 24 hours) at 04/07/2019 0949 Last data filed at 04/07/2019 0600 Gross per 24 hour  Intake 1839.91 ml  Output -  Net 1839.91 ml   Filed Weights   04/05/19 0953  Weight: 79.9 kg  Examination: General exam: Appears calm and comfortable  Respiratory system: Clear to auscultation. Respiratory effort normal. Cardiovascular system: S1 & S2 heard, tachycardic rate, regular rhythm. No JVD, murmurs, rubs, gallops or clicks. No pedal edema. Gastrointestinal system: Abdomen is nondistended, soft, tender to palpation left lower quadrant and pelvic Central nervous system: Alert and oriented. No focal neurological deficits. Extremities: Right lower extremity in cast Skin: No  rashes, lesions or ulcers Psychiatry: Judgement and insight appear normal. Mood & affect appropriate.    Data Reviewed: I have personally reviewed following labs and imaging studies  CBC: Recent Labs  Lab 04/04/19 2100 04/05/19 0608 04/06/19 0605 04/07/19 0600  WBC 14.0* 11.7* 12.3* 12.0*  NEUTROABS 12.3* 10.0*  --   --   HGB 13.7 11.9* 10.7* 9.7*  HCT 40.7 36.5 33.1* 29.8*  MCV 91.3 94.3 97.1 95.2  PLT 380 319 283 245   Basic Metabolic Panel: Recent Labs  Lab 04/04/19 2100 04/05/19 0608 04/06/19 0605 04/07/19 0600  NA 140 141 140 138  K 3.2* 4.1 3.5 2.9*  CL 103 110 112* 109  CO2 24 22 23  19*  GLUCOSE 149* 133* 105* 84  BUN 17 15 16 11   CREATININE 0.67 0.68 0.48 0.53  CALCIUM 9.1 8.1* 7.8* 7.5*  MG 2.7* 2.3  --  1.9   GFR: Estimated Creatinine Clearance: 84.2 mL/min (by C-G formula based on SCr of 0.53 mg/dL). Liver Function Tests: Recent Labs  Lab 04/04/19 2100  AST 20  ALT 18  ALKPHOS 101  BILITOT 0.8  PROT 7.7  ALBUMIN 4.2   No results for input(s): LIPASE, AMYLASE in the last 168 hours. No results for input(s): AMMONIA in the last 168 hours. Coagulation Profile: No results for input(s): INR, PROTIME in the last 168 hours. Cardiac Enzymes: No results for input(s): CKTOTAL, CKMB, CKMBINDEX, TROPONINI in the last 168 hours. BNP (last 3 results) No results for input(s): PROBNP in the last 8760 hours. HbA1C: No results for input(s): HGBA1C in the last 72 hours. CBG: No results for input(s): GLUCAP in the last 168 hours. Lipid Profile: No results for input(s): CHOL, HDL, LDLCALC, TRIG, CHOLHDL, LDLDIRECT in the last 72 hours. Thyroid Function Tests: No results for input(s): TSH, T4TOTAL, FREET4, T3FREE, THYROIDAB in the last 72 hours. Anemia Panel: No results for input(s): VITAMINB12, FOLATE, FERRITIN, TIBC, IRON, RETICCTPCT in the last 72 hours. Sepsis Labs: Recent Labs  Lab 04/05/19 0253  LATICACIDVEN 1.2    Recent Results (from the past 240  hour(s))  SARS Coronavirus 2 (CEPHEID - Performed in [facility] hospital lab), Hosp Order     Status: None   Collection Time: 04/05/19 12:14 AM  Result Value Ref Range Status   SARS Coronavirus 2 NEGATIVE NEGATIVE Final    Comment: (NOTE) If result is NEGATIVE SARS-CoV-2 target nucleic acids are NOT DETECTED. The SARS-CoV-2 RNA is generally detectable in upper and lower  respiratory specimens during the acute phase of infection. The lowest  concentration of SARS-CoV-2 viral copies this assay can detect is 250  copies / mL. A negative result does not preclude SARS-CoV-2 infection  and should not be used as the sole basis for treatment or other  patient management decisions.  A negative result may occur with  improper specimen collection / handling, submission of specimen other  than nasopharyngeal swab, presence of viral mutation(s) within the  areas targeted by this assay, and inadequate number of viral copies  (<250 copies / mL). A negative result must be combined with clinical  observations, patient history, and epidemiological information. If result is POSITIVE SARS-CoV-2 target nucleic acids are DETECTED. The SARS-CoV-2 RNA is generally detectable in upper and lower  respiratory specimens dur ing the acute phase of infection.  Positive  results are indicative of active infection with SARS-CoV-2.  Clinical  correlation with patient history and other diagnostic information is  necessary to determine patient infection status.  Positive results do  not rule out bacterial infection or co-infection with other viruses. If result is PRESUMPTIVE POSTIVE SARS-CoV-2 nucleic acids MAY BE PRESENT.   A presumptive positive result was obtained on the submitted specimen  and confirmed on repeat testing.  While 2019 novel coronavirus  (SARS-CoV-2) nucleic acids may be present in the submitted sample  additional confirmatory testing may be necessary for epidemiological  and / or clinical  management purposes  to differentiate between  SARS-CoV-2 and other Sarbecovirus currently known to infect humans.  If clinically indicated additional testing with an alternate test  methodology [phone] is advised. The SARS-CoV-2 RNA is generally  detectable in upper and lower respiratory sp ecimens during the acute  phase of infection. The expected result is Negative. Fact Sheet for Patients:  BoilerBrush.com.cyhttps://www.fda.gov/media/136312/download Fact Sheet for Healthcare Providers: https://pope.com/https://www.fda.gov/media/136313/download This test is not yet approved or cleared by the [place] FDA and has been authorized for detection and/or diagnosis of SARS-CoV-2 by FDA under an Emergency Use Authorization (EUA).  This EUA will remain in effect (meaning this test can be used) for the duration of the COVID-19 declaration under Section 564(b)(1) of the Act, 21 U.S.C. section 360bbb-3(b)(1), unless the authorization is terminated or revoked sooner. Performed at [facility], [address] [place], [place] [address]   Culture, blood (routine x 2)     Status: None (Preliminary result)   Collection Time: 04/05/19  1:10 AM  Result Value Ref Range Status   Specimen Description   Final    BLOOD RIGHT HAND Performed at [facility], [address] [place], [place] [address]    Special Requests   Final    BOTTLES DRAWN AEROBIC AND ANAEROBIC Blood Culture results may not be optimal due to an excessive volume of blood received in culture bottles Performed at [facility], [address] [place], [place] [address]    Culture   Final    NO GROWTH 2 DAYS Performed at [facility], [address] [place], [place] [address]    Report Status PENDING  Incomplete  Culture, blood (routine x 2)     Status: None (Preliminary result)   Collection Time: 04/05/19  2:53 AM  Result Value Ref Range Status   Specimen Description   Final    BLOOD RIGHT ANTECUBITAL  Performed at [facility], [address] [place], [place] [address]    Special Requests   Final    BOTTLES DRAWN AEROBIC AND ANAEROBIC Blood Culture results may not be optimal due to an excessive volume of blood received in culture bottles Performed at [facility], [address] [place], [place] [address]    Culture   Final    NO GROWTH 2 DAYS Performed at [facility], [address] [place], [place] [address]    Report Status PENDING  Incomplete       Radiology Studies: No results found.    Scheduled Meds: . enoxaparin (LOVENOX) injection  40 mg Subcutaneous Q24H  . fluticasone  1 spray Each Nare Daily  Continuous Infusions: . sodium chloride Stopped (04/05/19 1756)  . sodium chloride 75 mL/hr at 04/07/19 0600  . methocarbamol (ROBAXIN) IV    . piperacillin-tazobactam (ZOSYN)  IV 3.375 g (04/07/19 0554)  . potassium chloride       LOS: 2 days    Time spent: 25 minutes   Noralee StainJennifer Ivis Henneman, DO [facility] [email or web address] 04/07/2019, 9:49 AM

## 2019-04-08 LAB — BASIC METABOLIC PANEL
Anion gap: 10 (ref 5–15)
BUN: 8 mg/dL (ref 6–20)
CO2: 23 mmol/L (ref 22–32)
Calcium: 8 mg/dL — ABNORMAL LOW (ref 8.9–10.3)
Chloride: 107 mmol/L (ref 98–111)
Creatinine, Ser: 0.51 mg/dL (ref 0.44–1.00)
GFR calc Af Amer: >60 mL/min (ref >60)
GFR calc non Af Amer: >60 mL/min (ref >60)
Glucose, Bld: 87 mg/dL (ref 70–99)
Potassium: 3.3 mmol/L — ABNORMAL LOW (ref 3.5–5.1)
Sodium: 140 mmol/L (ref 135–145)

## 2019-04-08 LAB — CBC
HCT: 31.7 % — ABNORMAL LOW (ref 36.0–46.0)
Hemoglobin: 10.2 g/dL — ABNORMAL LOW (ref 12.0–15.0)
MCH: 30.4 pg (ref 26.0–34.0)
MCHC: 32.2 g/dL (ref 30.0–36.0)
MCV: 94.6 fL (ref 80.0–100.0)
Platelets: 295 10*3/uL (ref 150–400)
RBC: 3.35 MIL/uL — ABNORMAL LOW (ref 3.87–5.11)
RDW: 13.1 % (ref 11.5–15.5)
WBC: 13 10*3/uL — ABNORMAL HIGH (ref 4.0–10.5)
nRBC: 0 % (ref 0.0–0.2)

## 2019-04-08 MED ORDER — PANTOPRAZOLE SODIUM 20 MG PO TBEC
20.0000 mg | DELAYED_RELEASE_TABLET | Freq: Two times a day (BID) | ORAL | Status: DC
  Administered 2019-04-08 – 2019-04-15 (×16): 20 mg via ORAL
  Filled 2019-04-08 (×18): qty 1

## 2019-04-08 MED ORDER — POTASSIUM CHLORIDE 10 MEQ/100ML IV SOLN
10.0000 meq | INTRAVENOUS | Status: AC
  Administered 2019-04-08 (×4): 10 meq via INTRAVENOUS
  Filled 2019-04-08 (×4): qty 100

## 2019-04-08 MED ORDER — BENZOCAINE 10 % MT GEL
Freq: Four times a day (QID) | OROMUCOSAL | Status: DC | PRN
  Filled 2019-04-08: qty 9.4

## 2019-04-08 MED ORDER — SODIUM CHLORIDE 0.9 % IV BOLUS
1000.0000 mL | Freq: Once | INTRAVENOUS | Status: AC
  Administered 2019-04-08: 1000 mL via INTRAVENOUS

## 2019-04-08 NOTE — Progress Notes (Signed)
Physical Therapy Evaluation Patient Details Name: Molly Beck MRN: [id number] DOB: 17-Nov-1965 Today's Date: 04/08/2019   History of Present Illness  Pt admitted with Colitis/sepsis and is s/p R foot reconstructive surgery.  Clinical Impression  Pt admitted as above and presenting with functional mobility limitations 2* abdominal pain and decreased activity tolerance.  Pt should progress to dc home with family assist.    Follow Up Recommendations No PT follow up    Equipment Recommendations  None recommended by PT    Recommendations for Other Services       Precautions / Restrictions Precautions Precautions: Fall Restrictions Weight Bearing Restrictions: Yes RLE Weight Bearing: Non weight bearing      Mobility  Bed Mobility Overal bed mobility: Modified Independent             General bed mobility comments: unassisted to EOB  Transfers Overall transfer level: Needs assistance Equipment used: Rolling walker (2 wheeled) Transfers: Sit to/from Stand Sit to Stand: Min guard;Supervision         General transfer comment: cues for use of UEs to self assist  Ambulation/Gait Ambulation/Gait assistance: Min guard;Supervision Gait Distance (Feet): 80 Feet(and 15' twice to/from bathroom) Assistive device: Rolling walker (2 wheeled) Gait Pattern/deviations: Step-to pattern;Decreased step length - right;Decreased step length - left;Shuffle;Trunk flexed Gait velocity: decr   General Gait Details: min cues for posture and position from RW; pt with excellent follow through of NWB on R LE  Stairs            Wheelchair Mobility    Modified Rankin (Stroke Patients Only)       Balance Overall balance assessment: Mild deficits observed, not formally tested                                           Pertinent Vitals/Pain Pain Assessment: 0-10 Pain Score: 5  Pain Location: abdomen; 3/10 foot with dependency Pain Descriptors / Indicators:  Aching;Dull;Sore Pain Intervention(s): Limited activity within patient's tolerance;Monitored during session    [place] expects to be discharged to:: Private residence Living Arrangements: Spouse/significant other Available Help at Discharge: Family Type of Home: House Home Access: Level entry     Home Layout: One [place];Other (comment)(knee scooter)      Prior Function Level of Independence: Independent;Independent with assistive device(s)         Comments: Pt utilizing knee scooter or crutches since recent surgery     Hand Dominance        Extremity/Trunk Assessment   Upper Extremity Assessment Upper Extremity Assessment: Overall WFL for tasks assessed    Lower Extremity Assessment Lower Extremity Assessment: RLE deficits/detail RLE Deficits / Details: dressings in place s/p recent surgery.  IND SLR    Cervical / Trunk Assessment Cervical / Trunk Assessment: Normal  Communication   Communication: No difficulties  Cognition Arousal/Alertness: Awake/alert Behavior During Therapy: WFL for tasks assessed/performed Overall Cognitive Status: Within Functional Limits for tasks assessed                                        General Comments      Exercises     Assessment/Plan    PT Assessment Patient needs continued PT services  PT Problem List Decreased activity tolerance;Decreased knowledge of use of  DME;Pain       PT Treatment Interventions DME instruction;Gait training;Functional mobility training;Therapeutic activities;Therapeutic exercise;Patient/family education    PT Goals (Current goals can be found in the Care Plan section)  Acute Rehab PT Goals Patient Stated Goal: Figure out what is going on with stomach and go home PT Goal Formulation: With patient Time For Goal Achievement: 04/22/19 Potential to Achieve Goals: Good    Frequency Min 3X/week   Barriers to discharge         Co-evaluation               AM-PAC PT "6 Clicks" Mobility  Outcome Measure Help needed turning from your back to your side while in a flat bed without using bedrails?: None Help needed moving from lying on your back to sitting on the side of a flat bed without using bedrails?: None Help needed moving to and from a bed to a chair (including a wheelchair)?: A Little Help needed standing up from a chair using your arms (e.g., wheelchair or bedside chair)?: A Little Help needed to walk in hospital room?: A Little Help needed climbing 3-5 steps with a railing? : A Little 6 Click Score: 20    End of Session Equipment Utilized During Treatment: Gait belt Activity Tolerance: Patient tolerated treatment well Patient left: in chair;with call bell/phone within reach Nurse Communication: Mobility status PT Visit Diagnosis: Difficulty in walking, not elsewhere classified (R26.2)    Time: [id number] PT Time Calculation (min) (ACUTE ONLY): 22 min   Charges:   PT Evaluation $PT Eval Low Complexity: 1 Low          Molly KaufmannHunter Kallyn Beck PT Acute Rehabilitation Services Pager [phone] Office [phone]   Molly Beck 04/08/2019, 12:47 PM

## 2019-04-08 NOTE — Progress Notes (Addendum)
PROGRESS NOTE    Molly ReiningCarol T Waight  ZOX:[id number] DOB: Jun 07, 1966 DOA: 04/04/2019 PCP: Clementeen GrahamScifres, [name], [name]     Brief Narrative:  Molly Beck is a 53 y.o. female with medical history significant of GERD, elective surgery of her right foot a week ago presenting to the hospital for evaluation of abdominal pain.  Patient states she had a reconstructive right foot surgery done a week ago.  States since then she has been constipated due to taking pain medications.  Her last bowel movement was prior to the surgery.  For the past few days she is having left lower quadrant abdominal pain which became worse for the past 24 hours.  The pain is sharp and excruciating.  States today she called her surgeon's office and was advised to take magnesium citrate for constipation.  She vomited soon after taking magnesium citrate.  Denies any fevers.  She continues to complain of abdominal pain despite receiving a dose of morphine and multiple doses of fentanyl in the ED. CT abdomen pelvis showing acute sigmoid colitis with small foci of adjacent gas that may be extra luminal and may indicate microperforation.  No abscess or drainable fluid collection. She was admitted for further treatment and care.  General surgery was consulted.  New events last 24 hours / Subjective: Pain is controlled with IV pain medication, fever overnight 103, WBC 13 today.  Currently pain well controlled, received pain medication about 45 minutes ago.  Assessment & Plan:   Principal Problem:   Colitis Active Problems:   Sepsis (HCC)   Hypokalemia   H/O foot surgery   Sepsis secondary to acute sigmoid colitis with microperforation -Sepsis present on admission -Appreciate general surgery consultation -Continue IV fluid -Continue IV Zosyn -Fever overnight, WBC now 13 -Blood cultures negative growth to date -NPO, general surgery planning on CT abdomen pelvis tomorrow  Recent right foot surgery -Currently in a cast, plans  to follow-up with her orthopedic surgeon as an outpatient -PT evaluation, nonweightbearing right lower extremity  Hypokalemia -Replace potassium and trend   DVT prophylaxis: Lovenox Code Status: Full Family Communication: None Disposition Plan: Pending improvement in her WBC, fever curve, diet advancement and general surgery recommendation    Consultants:   General surgery  Procedures:   None  Antimicrobials:  Anti-infectives (From admission, onward)   Start     Dose/Rate Route Frequency Ordered Stop   04/05/19 0600  piperacillin-tazobactam (ZOSYN) IVPB 3.375 g     3.375 g 12.5 mL/hr over 240 Minutes Intravenous Every 8 hours 04/05/19 0121     04/05/19 0000  piperacillin-tazobactam (ZOSYN) IVPB 3.375 g     3.375 g 100 mL/hr over 30 Minutes Intravenous  Once 04/04/19 2355 04/05/19 0119       Objective: Vitals:   04/08/19 0544 04/08/19 0615 04/08/19 0649 04/08/19 0736  BP: 135/89   (!) 142/92  Pulse: (!) 123 (!) 122 (!) 118 (!) 120  Resp: 14 15 14 20   Temp: (!) 103 F (39.4 C) (!) 100.7 F (38.2 C) (!) 100.6 F (38.1 C) 99.1 F (37.3 C)  TempSrc: Oral Oral Oral Oral  SpO2:    95%  Weight:      Height:        Intake/Output Summary (Last 24 hours) at 04/08/2019 1005 Last data filed at 04/07/2019 1800 Gross per 24 hour  Intake 974.01 ml  Output -  Net 974.01 ml   Filed Weights   04/05/19 0953  Weight: 79.9 kg    Examination: General  exam: Appears calm and comfortable  Respiratory system: Clear to auscultation. Respiratory effort normal. Cardiovascular system: S1 & S2 heard, RRR. No JVD, murmurs, rubs, gallops or clicks. No pedal edema. Gastrointestinal system: Abdomen is nondistended, soft and minimally tender to palpation left lower quadrant Central nervous system: Alert and oriented. No focal neurological deficits. Extremities: Symmetric 5 x 5 power. Skin: No rashes, lesions or ulcers Psychiatry: Judgement and insight appear normal. Mood & affect  appropriate.    Data Reviewed: I have personally reviewed following labs and imaging studies  CBC: Recent Labs  Lab 04/04/19 2100 04/05/19 0608 04/06/19 0605 04/07/19 0600 04/08/19 0511  WBC 14.0* 11.7* 12.3* 12.0* 13.0*  NEUTROABS 12.3* 10.0*  --   --   --   HGB 13.7 11.9* 10.7* 9.7* 10.2*  HCT 40.7 36.5 33.1* 29.8* 31.7*  MCV 91.3 94.3 97.1 95.2 94.6  PLT 380 319 283 245 295   Basic Metabolic Panel: Recent Labs  Lab 04/04/19 2100 04/05/19 0608 04/06/19 0605 04/07/19 0600 04/08/19 0511  NA 140 141 140 138 140  K 3.2* 4.1 3.5 2.9* 3.3*  CL 103 110 112* 109 107  CO2 24 22 23  19* 23  GLUCOSE 149* 133* 105* 84 87  BUN 17 15 16 11 8   CREATININE 0.67 0.68 0.48 0.53 0.51  CALCIUM 9.1 8.1* 7.8* 7.5* 8.0*  MG 2.7* 2.3  --  1.9  --    GFR: Estimated Creatinine Clearance: 84.2 mL/min (by C-G formula based on SCr of 0.51 mg/dL). Liver Function Tests: Recent Labs  Lab 04/04/19 2100  AST 20  ALT 18  ALKPHOS 101  BILITOT 0.8  PROT 7.7  ALBUMIN 4.2   No results for input(s): LIPASE, AMYLASE in the last 168 hours. No results for input(s): AMMONIA in the last 168 hours. Coagulation Profile: No results for input(s): INR, PROTIME in the last 168 hours. Cardiac Enzymes: No results for input(s): CKTOTAL, CKMB, CKMBINDEX, TROPONINI in the last 168 hours. BNP (last 3 results) No results for input(s): PROBNP in the last 8760 hours. HbA1C: No results for input(s): HGBA1C in the last 72 hours. CBG: No results for input(s): GLUCAP in the last 168 hours. Lipid Profile: No results for input(s): CHOL, HDL, LDLCALC, TRIG, CHOLHDL, LDLDIRECT in the last 72 hours. Thyroid Function Tests: No results for input(s): TSH, T4TOTAL, FREET4, T3FREE, THYROIDAB in the last 72 hours. Anemia Panel: No results for input(s): VITAMINB12, FOLATE, FERRITIN, TIBC, IRON, RETICCTPCT in the last 72 hours. Sepsis Labs: Recent Labs  Lab 04/05/19 0253  LATICACIDVEN 1.2    Recent Results (from the  past 240 hour(s))  SARS Coronavirus 2 (CEPHEID - Performed [facility]), Hosp Order     Status: None   Collection Time: 04/05/19 12:14 AM  Result Value Ref Range Status   SARS Coronavirus 2 NEGATIVE NEGATIVE Final    Comment: (NOTE) If result is NEGATIVE SARS-CoV-2 target nucleic acids are NOT DETECTED. The SARS-CoV-2 RNA is generally detectable in upper and lower  respiratory specimens during the acute phase of infection. The lowest  concentration of SARS-CoV-2 viral copies this assay can detect is 250  copies / mL. A negative result does not preclude SARS-CoV-2 infection  and should not be used as the sole basis for treatment or other  patient management decisions.  A negative result may occur with  improper specimen collection / handling, submission of specimen other  than nasopharyngeal swab, presence of viral mutation(s) within the  areas targeted by this assay, and  inadequate number of viral copies  (<250 copies / mL). A negative result must be combined with clinical  observations, patient history, and epidemiological information. If result is POSITIVE SARS-CoV-2 target nucleic acids are DETECTED. The SARS-CoV-2 RNA is generally detectable in upper and lower  respiratory specimens dur ing the acute phase of infection.  Positive  results are indicative of active infection with SARS-CoV-2.  Clinical  correlation with patient history and other diagnostic information is  necessary to determine patient infection status.  Positive results do  not rule out bacterial infection or co-infection with other viruses. If result is PRESUMPTIVE POSTIVE SARS-CoV-2 nucleic acids MAY BE PRESENT.   A presumptive positive result was obtained on the submitted specimen  and confirmed on repeat testing.  While 2019 novel coronavirus  (SARS-CoV-2) nucleic acids may be present in the submitted sample  additional confirmatory testing may be necessary for epidemiological  and / or  clinical management purposes  to differentiate between  SARS-CoV-2 and other Sarbecovirus currently known to infect humans.  If clinically indicated additional testing with an alternate test  methodology [id number]) is advised. The SARS-CoV-2 RNA is generally  detectable in upper and lower respiratory sp ecimens during the acute  phase of infection. The expected result is Negative. Fact Sheet for Patients:  StrictlyIdeas.no Fact Sheet for Healthcare Providers: BankingDealers.co.za This test is not yet approved or cleared by the [place] FDA and has been authorized for detection and/or diagnosis of SARS-CoV-2 by FDA under an Emergency Use Authorization (EUA).  This EUA will remain in effect (meaning this test can be used) for the duration of the COVID-19 declaration under Section 564(b)(1) of the Act, 21 U.S.C. section 360bbb-3(b)(1), unless the authorization is terminated or revoked sooner. Performed at [facility], [place] [address] [place], [place] [address]   Culture, blood (routine x 2)     Status: None (Preliminary result)   Collection Time: 04/05/19  1:10 AM  Result Value Ref Range Status   Specimen Description   Final    BLOOD RIGHT HAND Performed at [facility], [place] [address] [place], [place] [address]    Special Requests   Final    BOTTLES DRAWN AEROBIC AND ANAEROBIC Blood Culture results may not be optimal due to an excessive volume of blood received in culture bottles Performed at [place] [address] [place], [place] [address]    Culture   Final    NO GROWTH 3 DAYS Performed at [facility], [place] [address] [place], [place] [address]    Report Status PENDING  Incomplete  Culture, blood (routine x 2)     Status: None (Preliminary result)   Collection Time: 04/05/19  2:53 AM  Result Value Ref Range Status   Specimen Description   Final    BLOOD RIGHT  ANTECUBITAL Performed at [place] [address] [place], [place] [address]    Special Requests   Final    BOTTLES DRAWN AEROBIC AND ANAEROBIC Blood Culture results may not be optimal due to an excessive volume of blood received in culture bottles Performed at [place] [address], [place], [place] [address]    Culture   Final    NO GROWTH 3 DAYS Performed at [facility], [place] [address], [place], [place] [address]    Report Status PENDING  Incomplete       Radiology Studies: No results found.    Scheduled  Meds: . enoxaparin (LOVENOX) injection  40 mg Subcutaneous Q24H  . fluticasone  1 spray Each Nare Daily   Continuous Infusions: . sodium chloride Stopped (04/05/19 1756)  . sodium chloride 75 mL/hr at 04/07/19 1536  . methocarbamol (ROBAXIN) IV    . piperacillin-tazobactam (ZOSYN)  IV 3.375 g (04/08/19 0646)  . potassium chloride       LOS: 3 days    Time spent: 25 minutes   Noralee StainJennifer Nasim Garofano, DO [facility] [email or web address] 04/08/2019, 10:05 AM

## 2019-04-08 NOTE — Progress Notes (Signed)
   04/08/19 0447  MEWS Score  Resp 12  Pulse Rate (!) 120 (RN notified )  BP 126/87  Temp (!) 101.1 F (38.4 C)  SpO2 95 %  O2 Device Room Air  MEWS Score  MEWS RR 1  MEWS Pulse 2  MEWS Systolic 0  MEWS LOC 0  MEWS Temp 1  MEWS Score 4  MEWS Score Color Red   On-call NP X. Blount notified of mews score and vitals. Order received for sodium chloride bolus was carried out. RN administered prn tylenol via rectal suppository due to NPO status. Will continue to monitor and recheck vitals frequently.

## 2019-04-08 NOTE — Progress Notes (Signed)
   Subjective/Chief Complaint: Febrile overnight. NPO  No bowel function   Objective: Vital signs in last 24 hours: Temp:  [98.9 F (37.2 C)-103 F (39.4 C)] 99.1 F (37.3 C) (06/07 0736) Pulse Rate:  [108-128] 120 (06/07 0736) Resp:  [12-20] 20 (06/07 0736) BP: (126-152)/(87-99) 142/92 (06/07 0736) SpO2:  [95 %-97 %] 95 % (06/07 0736) Last BM Date: 04/05/19  Intake/Output from previous day: 06/06 0701 - 06/07 0700 In: 974 [I.V.:889.7; IV Piggyback:84.3] Out: -  Intake/Output this shift: No intake/output data recorded.  Constitutional: No acute distress, conversant, appears states age. Eyes: Anicteric sclerae, moist conjunctiva, no lid lag Lungs: Clear to auscultation bilaterally, normal respiratory effort CV: regular rate and rhythm, no murmurs, no peripheral edema, pedal pulses 2+ GI: Soft, no masses or hepatosplenomegaly, tender to palpation LLQ, no rebound/guarding, distended Skin: No rashes, palpation reveals normal turgor Psychiatric: appropriate judgment and insight, oriented to person, place, and time  Lab Results:  Recent Labs    04/07/19 0600 04/08/19 0511  WBC 12.0* 13.0*  HGB 9.7* 10.2*  HCT 29.8* 31.7*  PLT 245 295   BMET Recent Labs    04/07/19 0600 04/08/19 0511  NA 138 140  K 2.9* 3.3*  CL 109 107  CO2 19* 23  GLUCOSE 84 87  BUN 11 8  CREATININE 0.53 0.51  CALCIUM 7.5* 8.0*    Anti-infectives: Anti-infectives (From admission, onward)   Start     Dose/Rate Route Frequency Ordered Stop   04/05/19 0600  piperacillin-tazobactam (ZOSYN) IVPB 3.375 g     3.375 g 12.5 mL/hr over 240 Minutes Intravenous Every 8 hours 04/05/19 0121     04/05/19 0000  piperacillin-tazobactam (ZOSYN) IVPB 3.375 g     3.375 g 100 mL/hr over 30 Minutes Intravenous  Once 04/04/19 2355 04/05/19 0119      Assessment/Plan: GERD Osteoarthritis - takes meloxicam and tramadol at baseline Recent right ankle surgery by Dr. Doran Durand about 1 week ago  Sigmoid  colitis - no prior history of colitis - never had a colonoscopy before - CT scan 6/3 showed acute sigmoid colitis with small foci of adjacent gasindicative ofmicro perforation, no abscess or drainable fluid collection  ID -zosyn 6/4>> VTE -SCD, Beck for chemical DVT prophylaxis from surgical standpoint FEN -IVF, NPO Foley -none Follow up -TBD  Plan: Continue IVF and IV zosyn.  Con't NPO for today.  Will likely repeat CT Monday to eval for possible drainable IAA   LOS: 3 days    Molly Beck 04/08/2019

## 2019-04-09 ENCOUNTER — Inpatient Hospital Stay (HOSPITAL_COMMUNITY): Payer: PRIVATE HEALTH INSURANCE

## 2019-04-09 ENCOUNTER — Encounter (HOSPITAL_COMMUNITY): Payer: Self-pay | Admitting: Radiology

## 2019-04-09 DIAGNOSIS — K631 Perforation of intestine (nontraumatic): Secondary | ICD-10-CM

## 2019-04-09 LAB — BASIC METABOLIC PANEL
Anion gap: 12 (ref 5–15)
BUN: 6 mg/dL (ref 6–20)
CO2: 16 mmol/L — ABNORMAL LOW (ref 22–32)
Calcium: 7.7 mg/dL — ABNORMAL LOW (ref 8.9–10.3)
Chloride: 109 mmol/L (ref 98–111)
Creatinine, Ser: 0.52 mg/dL (ref 0.44–1.00)
GFR calc Af Amer: >60 mL/min (ref >60)
GFR calc non Af Amer: >60 mL/min (ref >60)
Glucose, Bld: 72 mg/dL (ref 70–99)
Potassium: 3.4 mmol/L — ABNORMAL LOW (ref 3.5–5.1)
Sodium: 137 mmol/L (ref 135–145)

## 2019-04-09 LAB — CBC
HCT: 30 % — ABNORMAL LOW (ref 36.0–46.0)
Hemoglobin: 9.8 g/dL — ABNORMAL LOW (ref 12.0–15.0)
MCH: 31.1 pg (ref 26.0–34.0)
MCHC: 32.7 g/dL (ref 30.0–36.0)
MCV: 95.2 fL (ref 80.0–100.0)
Platelets: 287 10*3/uL (ref 150–400)
RBC: 3.15 MIL/uL — ABNORMAL LOW (ref 3.87–5.11)
RDW: 13.4 % (ref 11.5–15.5)
WBC: 13.4 10*3/uL — ABNORMAL HIGH (ref 4.0–10.5)
nRBC: 0 % (ref 0.0–0.2)

## 2019-04-09 MED ORDER — POTASSIUM CHLORIDE 10 MEQ/100ML IV SOLN
10.0000 meq | INTRAVENOUS | Status: AC
  Administered 2019-04-09 (×4): 10 meq via INTRAVENOUS
  Filled 2019-04-09 (×4): qty 100

## 2019-04-09 MED ORDER — ENOXAPARIN SODIUM 40 MG/0.4ML ~~LOC~~ SOLN
40.0000 mg | SUBCUTANEOUS | Status: DC
  Administered 2019-04-11 – 2019-04-15 (×5): 40 mg via SUBCUTANEOUS
  Filled 2019-04-09 (×4): qty 0.4

## 2019-04-09 MED ORDER — IOHEXOL 300 MG/ML  SOLN
100.0000 mL | Freq: Once | INTRAMUSCULAR | Status: AC | PRN
  Administered 2019-04-09: 100 mL via INTRAVENOUS

## 2019-04-09 MED ORDER — IOHEXOL 300 MG/ML  SOLN
30.0000 mL | Freq: Once | INTRAMUSCULAR | Status: AC | PRN
  Administered 2019-04-15: 30 mL via ORAL

## 2019-04-09 MED ORDER — SODIUM CHLORIDE (PF) 0.9 % IJ SOLN
INTRAMUSCULAR | Status: AC
  Filled 2019-04-09: qty 50

## 2019-04-09 NOTE — Progress Notes (Signed)
  [facility] Progress Note     Subjective: CC-  Abdominal pain unchanged. Continues to have intermittent crampy abdominal pain. She reports bloating and nausea, no emesis. Passing some flatus. Last BM 6/4. WBC still elevated at 13.4, TMAX 100.5.  Objective: Vital signs in last 24 hours: Temp:  [98.2 F (36.8 C)-100.5 F (38.1 C)] 98.6 F (37 C) (06/08 0436) Pulse Rate:  [105-125] 105 (06/08 0436) Resp:  [16-20] 16 (06/08 0436) BP: (124-156)/(79-96) 124/79 (06/08 0436) SpO2:  [96 %-100 %] 96 % (06/08 0436) Last BM Date: 04/05/19  Intake/Output from previous day: No intake/output data recorded. Intake/Output this shift: No intake/output data recorded.  PE: Gen:  Alert, NAD, pleasant HEENT: EOM's intact, pupils equal and round Card:  RRR Pulm:  CTAB, no W/R/R, effort normal Abd: Soft, mild distension, +BS, no HSM, TTP LLQ without peritonitis Ext:  Splint to RLE. L calf soft and nontender Psych: A&Ox3  Skin: no rashes noted, warm and dry  Lab Results:  Recent Labs    04/08/19 0511 04/09/19 0500  WBC 13.0* 13.4*  HGB 10.2* 9.8*  HCT 31.7* 30.0*  PLT 295 287   BMET Recent Labs    04/08/19 0511 04/09/19 0500  NA 140 137  K 3.3* 3.4*  CL 107 109  CO2 23 16*  GLUCOSE 87 72  BUN 8 6  CREATININE 0.51 0.52  CALCIUM 8.0* 7.7*   PT/INR No results for input(s): LABPROT, INR in the last 72 hours. CMP     Component Value Date/Time   NA 137 04/09/2019 0500   K 3.4 (L) 04/09/2019 0500   CL 109 04/09/2019 0500   CO2 16 (L) 04/09/2019 0500   GLUCOSE 72 04/09/2019 0500   BUN 6 04/09/2019 0500   CREATININE 0.52 04/09/2019 0500   CALCIUM 7.7 (L) 04/09/2019 0500   PROT 7.7 04/04/2019 2100   ALBUMIN 4.2 04/04/2019 2100   AST 20 04/04/2019 2100   ALT 18 04/04/2019 2100   ALKPHOS 101 04/04/2019 2100   BILITOT 0.8 04/04/2019 2100   GFRNONAA >60 04/09/2019 0500   GFRAA >60 04/09/2019 0500   Lipase     Component Value Date/Time   LIPASE 25  09/12/2015 1615       Studies/Results: No results found.  Anti-infectives: Anti-infectives (From admission, onward)   Start     Dose/Rate Route Frequency Ordered Stop   04/05/19 0600  piperacillin-tazobactam (ZOSYN) IVPB 3.375 g     3.375 g 12.5 mL/hr over 240 Minutes Intravenous Every 8 hours 04/05/19 0121     04/05/19 0000  piperacillin-tazobactam (ZOSYN) IVPB 3.375 g     3.375 g 100 mL/hr over 30 Minutes Intravenous  Once 04/04/19 2355 04/05/19 0119       Assessment/Plan GERD Osteoarthritis - takes meloxicam and tramadol at baseline Recent right ankle surgery by Dr. Doran Durand 03/27/2019  Sigmoid colitis - no prior history of colitis - never had a colonoscopy before - CT scan 6/3 showed acute sigmoid colitis with small foci of adjacent gasindicative ofmicro perforation, no abscess or drainable fluid collection  ID -zosyn 6/4>> VTE -SCD, lovenox FEN -IVF,NPO Foley -none Follow up -TBD  Plan: Repeat abdominal CT scan today to evaluate for possible intraabdominal abscess. Continue bowel rest, IVF and IV zosyn.   LOS: 4 days    Wellington Hampshire , [facility] 04/09/2019, 10:17 AM Pager: [phone] Mon-Thurs 7:00 am-4:30 pm Fri 7:00 am -11:30 AM Sat-Sun 7:00 am-11:30 am

## 2019-04-09 NOTE — Consult Note (Addendum)
Chief Complaint: Patient was seen in consultation today for CT guided drainage of abdominopelvic abscess Chief Complaint  Patient presents with   Dehydration   Dizziness    Referring Physician(s): Gross,S  Supervising Physician: Irish LackYamagata, Glenn  Patient Status[facility]  History of Present Illness: Molly Beck is a 53 y.o. female with history of GERD, osteoarthritis, anxiety, status post reconstructive right foot surgery approximately 2 weeks ago who was [facility] 6/3 with lower abdominal pain, weakness,mild temperature elevation, back pain, constipation, urinary frequency.  Constipation was exacerbated by pain med use following foot surgery.  Latest CT of abdomen pelvis performed today reveals findings consistent with acute sigmoid diverticulitis with associated abscess.  She is COVID-19 negative.  WBC 13.4, hemoglobin 9.8, platelets 287K, creatinine 0.52.  PT/INR pending.  Blood cultures pending.  Request now received from surgery for CT-guided drainage of the diverticular abscess.  Past Medical History:  Diagnosis Date   Allergy    Anxiety    GERD (gastroesophageal reflux disease)    Headache    migraines   Heartburn    OA (osteoarthritis)    T/O   Recurrent cold sores    Tendon laceration    right  small finger   Wears glasses     Past Surgical History:  Procedure Laterality Date   I&D EXTREMITY Right 05/31/2017   Procedure: Right small finger irrigation and debridement and flexor tendon reconstruction;  Surgeon: Dominica SeverinGramig, William, MD;  Location: [facility] OR;  Service: Orthopedics;  Laterality: Right;   KNEE ARTHROSCOPY     B/L   REPLACEMENT TOTAL KNEE     B/L   TENDON REPAIR     left thumb   WISDOM TOOTH EXTRACTION      Allergies: Codeine  Medications: Prior to Admission medications   Medication Sig Start Date End Date Taking? Authorizing Provider  acetaminophen (TYLENOL) 325 MG tablet Take 650 mg by mouth every  6 (six) hours as needed (for back pain/pain.).   Yes [provider]  ALPRAZolam (XANAX) 0.25 MG tablet Take 0.25 mg by mouth 2 (two) times daily as needed for anxiety.   Yes [provider]  ASPIRIN ADULT LOW STRENGTH 81 MG EC tablet Take 81 mg by mouth daily. 03/27/19  Yes [provider]  cetirizine (ZYRTEC) 10 MG tablet Take 10 mg by mouth daily.   Yes [provider]  eletriptan (RELPAX) 40 MG tablet Take 40 mg by mouth as needed for migraine or headache. May repeat in 2 hours if headache persists or recurs.   Yes [provider]  fluticasone (FLONASE) 50 MCG/ACT nasal spray Place 1 spray into both nostrils daily.   Yes [provider]  Homeopathic Products [facility](SIMILASAN ALLERGY EYE RELIEF OP) Apply to eye.   Yes [provider]  ibuprofen (ADVIL,MOTRIN) 200 MG tablet Take 600 mg by mouth 2 (two) times daily as needed (for pain.).   Yes [provider]  LACTASE PO Take 5 mg by mouth daily as needed (for milk consumption).   Yes [provider]  meloxicam (MOBIC) 15 MG tablet Take 15 mg by mouth daily as needed for muscle spasms. 03/14/19  Yes [provider]  montelukast (SINGULAIR) 10 MG tablet Take 10 mg by mouth daily at 2 PM. 04/11/17  Yes [provider]  ondansetron (ZOFRAN) 4 MG tablet Take 4 mg by mouth daily as needed for nausea. 03/27/19  Yes [provider]  traMADol (ULTRAM) 50 MG tablet  Take 25-50 mg by mouth 3 (three) times daily as needed (for back pain.).   Yes [provider]  valACYclovir (VALTREX) 500 MG tablet Take 500 mg by mouth 2 (two) times daily as needed. For cold sores 03/24/17  Yes [provider]  oxyCODONE (OXY IR/ROXICODONE) 5 MG immediate release tablet Take 5 mg by mouth every 4 (four) hours as needed for pain. 03/27/19   [provider]     Family History  Problem Relation Age of Onset   Macular degeneration Mother    Pancreatic  cancer Father    Other Sister     Social History   Socioeconomic History   Marital status: Divorced    Spouse name: Not on file   Number of children: Not on file   Years of education: Not on file   Highest education level: Not on file  Occupational History   Not on file  Social Needs   Financial resource strain: Not on file   Food insecurity:    Worry: Not on file    Inability: Not on file   Transportation needs:    Medical: Not on file    Non-medical: Not on file  Tobacco Use   Smoking status: Former Smoker   Smokeless tobacco: Never Used   Tobacco comment: during college only  Substance and Sexual Activity   Alcohol use: Yes    Comment: occasional   Drug use: No   Sexual activity: Not on file  Lifestyle   Physical activity:    Days per week: Not on file    Minutes per session: Not on file   Stress: Not on file  Relationships   Social connections:    Talks on phone: Not on file    Gets together: Not on file    Attends religious service: Not on file    Active member of club or organization: Not on file    Attends meetings of clubs or organizations: Not on file    Relationship status: Not on file  Other Topics Concern   Not on file  Social History Narrative   Not on file      Review of Systems see above; currently denies nausea, vomiting or bleeding; she is anxious and does have some dyspnea with exertion.  Vital Signs: BP 121/86 (BP Location: Right Arm)    Pulse (!) 108    Temp 98.9 F (37.2 C) (Oral)    Resp 17    Ht 5\' 4"  (1.626 m)    Wt 176 lb 2.4 oz (79.9 kg)    SpO2 99%    BMI 30.24 kg/m   Physical Exam awake, alert.  Chest clear to auscultation bilaterally.  Heart with tachycardic but regular rhythm.  Abdomen soft, positive bowel sounds, left greater than right lower abdominal tenderness to palpation; right foot in cast, no left lower extremity edema.  Imaging: Ct Abdomen Pelvis W Contrast  Result Date: 04/09/2019 CLINICAL DATA:   Elevated white blood cell count and patient with abdominal pain for 1 week. EXAM: CT ABDOMEN AND PELVIS WITH CONTRAST TECHNIQUE: Multidetector CT imaging of the abdomen and pelvis was performed using the standard protocol following bolus administration of intravenous contrast. CONTRAST:  100 mL OMNIPAQUE IOHEXOL 300 MG/ML  SOLN COMPARISON:  CT abdomen pelvis 04/04/2019. FINDINGS: Lower chest: Heart size is normal. No pleural or pericardial effusion. Minimal right basilar atelectasis noted. Hepatobiliary: The gallbladder is distended but otherwise unremarkable. The liver and biliary tree appear normal. Pancreas: Unremarkable. No  pancreatic ductal dilatation or surrounding inflammatory changes. Spleen: Normal in size without focal abnormality. Adrenals/Urinary Tract: Small cyst lower pole right kidney incidentally noted. The kidneys otherwise appear normal. Ureters and urinary bladder are normal in appearance. Stomach/Bowel: Extensive inflammatory change about the sigmoid colon has markedly worsened. There is an air and fluid collection posterior and to the right of the uterus which measures approximately 6.5 cm transverse by 9 cm AP by 5 cm craniocaudal consistent with an abscess. A second air and fluid collection consistent with abscess more superiorly in the central pelvis measures 4 cm AP by 3.5 cm transverse by 7.5 cm craniocaudal. This collection is contains predominantly gas and it overlies a loop of small bowel which appears inflamed. Presacral edema is identified. The walls of the sigmoid colon are markedly thickened inflamed. The colon otherwise appears normal. The stomach, small bowel and appendix are normal in appearance. Vascular/Lymphatic: No significant vascular findings are present. No enlarged abdominal or pelvic lymph nodes. Reproductive: Uterus and bilateral adnexa are unremarkable. IUD noted. Other: None. Musculoskeletal: No acute or focal bony abnormality. Severe left hip osteoarthritis noted.  IMPRESSION: Marked worsening findings in the pelvis most consistent with acute sigmoid diverticulitis with 2 new abscesses identified as described above. These results were called by telephone at the time of interpretation on 04/09/2019 at 2:03 pm to [facility] JENNINGS, P.A., Who verbally acknowledged these results. Electronically Signed   By: Drusilla Kannerhomas  Dalessio M.D.   On: 04/09/2019 14:07   Ct Abdomen Pelvis W Contrast  Result Date: 04/04/2019 CLINICAL DATA:  Abdominal pain and decreased bowel movement since surgery EXAM: CT ABDOMEN AND PELVIS WITH CONTRAST TECHNIQUE: Multidetector CT imaging of the abdomen and pelvis was performed using the standard protocol following bolus administration of intravenous contrast. CONTRAST:  100mL OMNIPAQUE IOHEXOL 300 MG/ML  SOLN COMPARISON:  None. FINDINGS: LOWER CHEST: There is no basilar pleural or apical pericardial effusion. HEPATOBILIARY: The hepatic contours and density are normal. There is no intra- or extrahepatic biliary dilatation. The gallbladder is normal. PANCREAS: The pancreatic parenchymal contours are normal and there is no ductal dilatation. There is no peripancreatic fluid collection. SPLEEN: Normal. ADRENALS/URINARY TRACT: --Adrenal glands: Normal. --Right kidney/ureter: No hydronephrosis, nephroureterolithiasis, perinephric stranding or solid renal mass. --Left kidney/ureter: No hydronephrosis, nephroureterolithiasis, perinephric stranding or solid renal mass. --Urinary bladder: Normal for degree of distention STOMACH/BOWEL: --Stomach/Duodenum: There is no hiatal hernia or other gastric abnormality. The duodenal course and caliber are normal. --Small bowel: No dilatation or inflammation. --Colon: There is mild inflammation of the sigmoid colon. There are small foci of gas along the anterior contour of the distal sigmoid colon that may indicate microperforation (coronal images 43, 46, 47). Large amount of colonic stool. --Appendix: Not clearly visualized.  VASCULAR/LYMPHATIC: Normal course and caliber of the major abdominal vessels. No abdominal or pelvic lymphadenopathy. REPRODUCTIVE: There is a T-shaped contraceptive device within the uterus. MUSCULOSKELETAL. No bony spinal canal stenosis or focal osseous abnormality. OTHER: None. IMPRESSION: Acute sigmoid colitis with small foci of adjacent gas that may be extraluminal. This may indicate micro perforation. No abscess or drainable fluid collection. Electronically Signed   By: Deatra RobinsonKevin  Herman M.D.   On: 04/04/2019 23:32    Labs:  CBC: Recent Labs    04/06/19 0605 04/07/19 0600 04/08/19 0511 04/09/19 0500  WBC 12.3* 12.0* 13.0* 13.4*  HGB 10.7* 9.7* 10.2* 9.8*  HCT 33.1* 29.8* 31.7* 30.0*  PLT 283 245 295 287    COAGS: No results for input(s): INR, APTT in the last  8760 hours.  BMP: Recent Labs    04/06/19 0605 04/07/19 0600 04/08/19 0511 04/09/19 0500  NA 140 138 140 137  K 3.5 2.9* 3.3* 3.4*  CL 112* 109 107 109  CO2 23 19* 23 16*  GLUCOSE 105* 84 87 72  BUN 16 11 8 6   CALCIUM 7.8* 7.5* 8.0* 7.7*  CREATININE 0.48 0.53 0.51 0.52  GFRNONAA >60 >60 >60 >60  GFRAA >60 >60 >60 >60    LIVER FUNCTION TESTS: Recent Labs    04/04/19 2100  BILITOT 0.8  AST 20  ALT 18  ALKPHOS 101  PROT 7.7  ALBUMIN 4.2    TUMOR MARKERS: No results for input(s): AFPTM, CEA, CA199, CHROMGRNA in the last 8760 hours.  Assessment and Plan: 53 y.o. female with history of GERD, osteoarthritis, anxiety, status post reconstructive right foot surgery approximately 2 weeks ago who was admitted to [facility] 6/3 with lower abdominal pain, weakness,mild temperature elevation, back pain, constipation, urinary frequency.  Constipation was exacerbated by pain med use following foot surgery.  Latest CT of abdomen pelvis performed today reveals findings consistent with acute sigmoid diverticulitis with associated abscess.  She is COVID-19 negative.  Currently afebrile.  WBC 13.4, hemoglobin 9.8,  platelets 287K, creatinine 0.52.  PT/INR pending.  Blood cultures pending.  Request now received from surgery for CT-guided drainage of the diverticular abscess.  Imaging studies have been reviewed by Dr. Kathlene Cote.Risks and benefits discussed with the patient including bleeding, infection, damage to adjacent structures, bowel perforation/fistula connection, and sepsis along with inability to place drain due to overlying bowel loops.  All of the patient's questions were answered, patient is agreeable to proceed. Consent signed and in chart.  Procedure tent planned for 6/9; hold lovenox until after above procedure   Thank you for this interesting consult.  I greatly enjoyed meeting SOLE LENGACHER and look forward to participating in their care.  A copy of this report was sent to the requesting provider on this date.  Electronically Signed: D. Rowe Robert, [name] 04/09/2019, 4:52 PM   I spent a total of 30 minutes  in face to face in clinical consultation, greater than 50% of which was counseling/coordinating care for CT-guided drainage of abdominal/pelvic abscess

## 2019-04-09 NOTE — Progress Notes (Signed)
PROGRESS NOTE    Molly Beck  ZOX:[id number] DOB: 31-Mar-1966 DOA: 04/04/2019 PCP: Clementeen GrahamScifres, [name], [name]     Brief Narrative:  Molly Beck is a 53 y.o. female with medical history significant of GERD, elective surgery of her right foot a week ago presenting to the hospital for evaluation of abdominal pain.  Patient states she had a reconstructive right foot surgery done a week ago.  States since then she has been constipated due to taking pain medications.  Her last bowel movement was prior to the surgery.  For the past few days she is having left lower quadrant abdominal pain which became worse for the past 24 hours.  The pain is sharp and excruciating.  States today she called her surgeon's office and was advised to take magnesium citrate for constipation.  She vomited soon after taking magnesium citrate.  Denies any fevers.  She continues to complain of abdominal pain despite receiving a dose of morphine and multiple doses of fentanyl in the ED. CT abdomen pelvis showing acute sigmoid colitis with small foci of adjacent gas that may be extra luminal and may indicate microperforation.  No abscess or drainable fluid collection. She was admitted for further treatment and care.  General surgery was consulted.  New events last 24 hours / Subjective: Continues to have intermittent abdominal pain, very little flatus, no bowel movement.  Fever overnight 100.5, WBC trending upward 13.4.  Assessment & Plan:   Principal Problem:   Colitis Active Problems:   Sepsis (HCC)   Hypokalemia   H/O foot surgery   Sepsis secondary to acute sigmoid colitis with microperforation -Sepsis present on admission -Appreciate general surgery consultation -Continue IV fluid -Continue IV Zosyn -Fever overnight, WBC now 13.4 -Blood cultures negative growth to date -Repeat CT abdomen pelvis today  Recent right foot surgery -Currently in a cast, plans to follow-up with her orthopedic surgeon as an  outpatient -PT evaluation, nonweightbearing right lower extremity  Hypokalemia -Replace potassium and trend   DVT prophylaxis: Lovenox Code Status: Full Family Communication: Mother on facetime during my examination Disposition Plan: Repeat CT abdomen pelvis due to nonimprovement in her WBC, fever and pain   Consultants:   General surgery  Procedures:   None  Antimicrobials:  Anti-infectives (From admission, onward)   Start     Dose/Rate Route Frequency Ordered Stop   04/05/19 0600  piperacillin-tazobactam (ZOSYN) IVPB 3.375 g     3.375 g 12.5 mL/hr over 240 Minutes Intravenous Every 8 hours 04/05/19 0121     04/05/19 0000  piperacillin-tazobactam (ZOSYN) IVPB 3.375 g     3.375 g 100 mL/hr over 30 Minutes Intravenous  Once 04/04/19 2355 04/05/19 0119       Objective: Vitals:   04/08/19 1745 04/08/19 2209 04/08/19 2322 04/09/19 0436  BP: (!) 156/90 (!) 145/96  124/79  Pulse: (!) 125 (!) 121 (!) 119 (!) 105  Resp: 20 20  16   Temp: (!) 100.5 F (38.1 C) 99.7 F (37.6 C) 99.8 F (37.7 C) 98.6 F (37 C)  TempSrc: Oral Oral Oral Oral  SpO2: 96% 98% 100% 96%  Weight:      Height:       No intake or output data in the 24 hours ending 04/09/19 1236 Filed Weights   04/05/19 0953  Weight: 79.9 kg    Examination: General exam: Appears calm and comfortable  Respiratory system: Clear to auscultation. Respiratory effort normal. Cardiovascular system: S1 & S2 heard, RRR. No JVD, murmurs, rubs, gallops  or clicks. No pedal edema. Gastrointestinal system: Abdomen is nondistended, soft and tender to palpation left lower quadrant Central nervous system: Alert and oriented. No focal neurological deficits. Extremities: Symmetric 5 x 5 power. Skin: No rashes, lesions or ulcers Psychiatry: Judgement and insight appear normal. Mood & affect appropriate.   Data Reviewed: I have personally reviewed following labs and imaging studies  CBC: Recent Labs  Lab 04/04/19 2100  04/05/19 0608 04/06/19 0605 04/07/19 0600 04/08/19 0511 04/09/19 0500  WBC 14.0* 11.7* 12.3* 12.0* 13.0* 13.4*  NEUTROABS 12.3* 10.0*  --   --   --   --   HGB 13.7 11.9* 10.7* 9.7* 10.2* 9.8*  HCT 40.7 36.5 33.1* 29.8* 31.7* 30.0*  MCV 91.3 94.3 97.1 95.2 94.6 95.2  PLT 380 319 283 245 295 287   Basic Metabolic Panel: Recent Labs  Lab 04/04/19 2100 04/05/19 0608 04/06/19 0605 04/07/19 0600 04/08/19 0511 04/09/19 0500  NA 140 141 140 138 140 137  K 3.2* 4.1 3.5 2.9* 3.3* 3.4*  CL 103 110 112* 109 107 109  CO2 24 22 23  19* 23 16*  GLUCOSE 149* 133* 105* 84 87 72  BUN 17 15 16 11 8 6   CREATININE 0.67 0.68 0.48 0.53 0.51 0.52  CALCIUM 9.1 8.1* 7.8* 7.5* 8.0* 7.7*  MG 2.7* 2.3  --  1.9  --   --    GFR: Estimated Creatinine Clearance: 84.2 mL/min (by C-G formula based on SCr of 0.52 mg/dL). Liver Function Tests: Recent Labs  Lab 04/04/19 2100  AST 20  ALT 18  ALKPHOS 101  BILITOT 0.8  PROT 7.7  ALBUMIN 4.2   No results for input(s): LIPASE, AMYLASE in the last 168 hours. No results for input(s): AMMONIA in the last 168 hours. Coagulation Profile: No results for input(s): INR, PROTIME in the last 168 hours. Cardiac Enzymes: No results for input(s): CKTOTAL, CKMB, CKMBINDEX, TROPONINI in the last 168 hours. BNP (last 3 results) No results for input(s): PROBNP in the last 8760 hours. HbA1C: No results for input(s): HGBA1C in the last 72 hours. CBG: No results for input(s): GLUCAP in the last 168 hours. Lipid Profile: No results for input(s): CHOL, HDL, LDLCALC, TRIG, CHOLHDL, LDLDIRECT in the last 72 hours. Thyroid Function Tests: No results for input(s): TSH, T4TOTAL, FREET4, T3FREE, THYROIDAB in the last 72 hours. Anemia Panel: No results for input(s): VITAMINB12, FOLATE, FERRITIN, TIBC, IRON, RETICCTPCT in the last 72 hours. Sepsis Labs: Recent Labs  Lab 04/05/19 0253  LATICACIDVEN 1.2    Recent Results (from the past 240 hour(s))  SARS Coronavirus 2  (CEPHEID - Performed in [facility] hospital lab), Hosp Order     Status: None   Collection Time: 04/05/19 12:14 AM  Result Value Ref Range Status   SARS Coronavirus 2 NEGATIVE NEGATIVE Final    Comment: (NOTE) If result is NEGATIVE SARS-CoV-2 target nucleic acids are NOT DETECTED. The SARS-CoV-2 RNA is generally detectable in upper and lower  respiratory specimens during the acute phase of infection. The lowest  concentration of SARS-CoV-2 viral copies this assay can detect is 250  copies / mL. A negative result does not preclude SARS-CoV-2 infection  and should not be used as the sole basis for treatment or other  patient management decisions.  A negative result may occur with  improper specimen collection / handling, submission of specimen other  than nasopharyngeal swab, presence of viral mutation(s) within the  areas targeted by this assay, and inadequate number of viral copies  (<  250 copies / mL). A negative result must be combined with clinical  observations, patient history, and epidemiological information. If result is POSITIVE SARS-CoV-2 target nucleic acids are DETECTED. The SARS-CoV-2 RNA is generally detectable in upper and lower  respiratory specimens dur ing the acute phase of infection.  Positive  results are indicative of active infection with SARS-CoV-2.  Clinical  correlation with patient history and other diagnostic information is  necessary to determine patient infection status.  Positive results do  not rule out bacterial infection or co-infection with other viruses. If result is PRESUMPTIVE POSTIVE SARS-CoV-2 nucleic acids MAY BE PRESENT.   A presumptive positive result was obtained on the submitted specimen  and confirmed on repeat testing.  While 2019 novel coronavirus  (SARS-CoV-2) nucleic acids may be present in the submitted sample  additional confirmatory testing may be necessary for epidemiological  and / or clinical management purposes  to differentiate  between  SARS-CoV-2 and other Sarbecovirus currently known to infect humans.  If clinically indicated additional testing with an alternate test  methodology [phone]([id number]) is advised. The SARS-CoV-2 RNA is generally  detectable in upper and lower respiratory sp ecimens during the acute  phase of infection. The expected result is Negative. Fact Sheet for Patients:  BoilerBrush.com.cyhttps://www.fda.gov/media/136312/download Fact Sheet for Healthcare Providers: https://pope.com/https://www.fda.gov/media/136313/download This test is not yet approved or cleared by the [place] FDA and has been authorized for detection and/or diagnosis of SARS-CoV-2 by FDA under an Emergency Use Authorization (EUA).  This EUA will remain in effect (meaning this test can be used) for the duration of the COVID-19 declaration under Section 564(b)(1) of the Act, 21 U.S.C. section 360bbb-3(b)(1), unless the authorization is terminated or revoked sooner. Performed at [facility], [address] [place], [place] [address]   Culture, blood (routine x 2)     Status: None (Preliminary result)   Collection Time: 04/05/19  1:10 AM  Result Value Ref Range Status   Specimen Description   Final    BLOOD RIGHT HAND Performed at [facility], [address] [place], [place] [address]    Special Requests   Final    BOTTLES DRAWN AEROBIC AND ANAEROBIC Blood Culture results may not be optimal due to an excessive volume of blood received in culture bottles Performed at [facility], [address] [place], [place] [address]    Culture   Final    NO GROWTH 4 DAYS Performed at [facility], [address] [place], [place] [address]    Report Status PENDING  Incomplete  Culture, blood (routine x 2)     Status: None (Preliminary result)   Collection Time: 04/05/19  2:53 AM  Result Value Ref Range Status   Specimen Description   Final    BLOOD RIGHT ANTECUBITAL Performed at [facility], [address] [place], [place] [address]    Special Requests   Final    BOTTLES DRAWN AEROBIC AND ANAEROBIC Blood Culture results may not be optimal due to an excessive volume of blood received in culture bottles Performed at [facility], [address] [place], [place] [address]    Culture   Final    NO GROWTH 4 DAYS Performed at [facility], [address] [place], [place] [address]    Report Status PENDING  Incomplete       Radiology Studies: No results found.    Scheduled Meds: . enoxaparin (LOVENOX) injection  40 mg Subcutaneous Q24H  . fluticasone  1 spray Each Nare Daily  . pantoprazole  20 mg Oral BID   Continuous Infusions: . sodium chloride Stopped (04/05/19 1756)  . sodium chloride 75 mL/hr at 04/08/19 1354  . methocarbamol (ROBAXIN) IV    . piperacillin-tazobactam (ZOSYN)  IV 3.375 g (04/09/19 0659)     LOS: 4 days    Time spent: 25 minutes   Dessa Phi, DO [facility] [email or web address] 04/09/2019, 12:36 PM

## 2019-04-10 ENCOUNTER — Inpatient Hospital Stay: Payer: Self-pay

## 2019-04-10 ENCOUNTER — Encounter (HOSPITAL_COMMUNITY): Payer: Self-pay | Admitting: Radiology

## 2019-04-10 ENCOUNTER — Inpatient Hospital Stay (HOSPITAL_COMMUNITY): Payer: PRIVATE HEALTH INSURANCE

## 2019-04-10 DIAGNOSIS — K631 Perforation of intestine (nontraumatic): Secondary | ICD-10-CM

## 2019-04-10 LAB — PROTIME-INR
INR: 1.2 (ref 0.8–1.2)
Prothrombin Time: 15.3 seconds — ABNORMAL HIGH (ref 11.4–15.2)

## 2019-04-10 LAB — CULTURE, BLOOD (ROUTINE X 2)
Culture: NO GROWTH
Culture: NO GROWTH

## 2019-04-10 LAB — GLUCOSE, CAPILLARY
Glucose-Capillary: 110 mg/dL — ABNORMAL HIGH (ref 70–99)
Glucose-Capillary: 52 mg/dL — ABNORMAL LOW (ref 70–99)

## 2019-04-10 LAB — CBC
HCT: 28.9 % — ABNORMAL LOW (ref 36.0–46.0)
Hemoglobin: 9.3 g/dL — ABNORMAL LOW (ref 12.0–15.0)
MCH: 30.1 pg (ref 26.0–34.0)
MCHC: 32.2 g/dL (ref 30.0–36.0)
MCV: 93.5 fL (ref 80.0–100.0)
Platelets: 300 10*3/uL (ref 150–400)
RBC: 3.09 MIL/uL — ABNORMAL LOW (ref 3.87–5.11)
RDW: 13.6 % (ref 11.5–15.5)
WBC: 10.4 10*3/uL (ref 4.0–10.5)
nRBC: 0 % (ref 0.0–0.2)

## 2019-04-10 LAB — BASIC METABOLIC PANEL
Anion gap: 12 (ref 5–15)
BUN: <5 mg/dL — ABNORMAL LOW (ref 6–20)
CO2: 14 mmol/L — ABNORMAL LOW (ref 22–32)
Calcium: 7.6 mg/dL — ABNORMAL LOW (ref 8.9–10.3)
Chloride: 109 mmol/L (ref 98–111)
Creatinine, Ser: 0.51 mg/dL (ref 0.44–1.00)
GFR calc Af Amer: >60 mL/min (ref >60)
GFR calc non Af Amer: >60 mL/min (ref >60)
Glucose, Bld: 66 mg/dL — ABNORMAL LOW (ref 70–99)
Potassium: 3 mmol/L — ABNORMAL LOW (ref 3.5–5.1)
Sodium: 135 mmol/L (ref 135–145)

## 2019-04-10 MED ORDER — HYDROCORTISONE 1 % EX CREA
TOPICAL_CREAM | Freq: Three times a day (TID) | CUTANEOUS | Status: DC | PRN
  Administered 2019-04-10 – 2019-04-12 (×5): via TOPICAL
  Filled 2019-04-10: qty 28

## 2019-04-10 MED ORDER — DIPHENHYDRAMINE HCL 50 MG/ML IJ SOLN
25.0000 mg | Freq: Four times a day (QID) | INTRAMUSCULAR | Status: DC | PRN
  Administered 2019-04-10 – 2019-04-12 (×6): 25 mg via INTRAVENOUS
  Filled 2019-04-10 (×6): qty 1

## 2019-04-10 MED ORDER — FENTANYL CITRATE (PF) 100 MCG/2ML IJ SOLN
INTRAMUSCULAR | Status: AC | PRN
  Administered 2019-04-10 (×2): 50 ug via INTRAVENOUS

## 2019-04-10 MED ORDER — KETOROLAC TROMETHAMINE 30 MG/ML IJ SOLN
30.0000 mg | Freq: Four times a day (QID) | INTRAMUSCULAR | Status: AC | PRN
  Administered 2019-04-12 – 2019-04-14 (×10): 30 mg via INTRAVENOUS
  Filled 2019-04-10 (×10): qty 1

## 2019-04-10 MED ORDER — TRAVASOL 10 % IV SOLN
INTRAVENOUS | Status: AC
  Administered 2019-04-10: 17:00:00 via INTRAVENOUS
  Filled 2019-04-10: qty 480

## 2019-04-10 MED ORDER — SODIUM CHLORIDE 0.9% FLUSH
10.0000 mL | Freq: Two times a day (BID) | INTRAVENOUS | Status: DC
  Administered 2019-04-10 – 2019-04-19 (×11): 10 mL

## 2019-04-10 MED ORDER — POTASSIUM CHLORIDE IN NACL 40-0.9 MEQ/L-% IV SOLN
INTRAVENOUS | Status: AC
  Filled 2019-04-10: qty 1000

## 2019-04-10 MED ORDER — INSULIN ASPART 100 UNIT/ML ~~LOC~~ SOLN
0.0000 [IU] | Freq: Four times a day (QID) | SUBCUTANEOUS | Status: DC
  Administered 2019-04-12 (×2): 1 [IU] via SUBCUTANEOUS

## 2019-04-10 MED ORDER — MIDAZOLAM HCL 2 MG/2ML IJ SOLN
INTRAMUSCULAR | Status: AC
  Filled 2019-04-10: qty 4

## 2019-04-10 MED ORDER — FENTANYL CITRATE (PF) 100 MCG/2ML IJ SOLN
INTRAMUSCULAR | Status: AC
  Filled 2019-04-10: qty 2

## 2019-04-10 MED ORDER — MIDAZOLAM HCL 2 MG/2ML IJ SOLN
INTRAMUSCULAR | Status: AC | PRN
  Administered 2019-04-10 (×4): 1 mg via INTRAVENOUS

## 2019-04-10 MED ORDER — POTASSIUM CHLORIDE 10 MEQ/100ML IV SOLN
INTRAVENOUS | Status: AC
  Filled 2019-04-10: qty 100

## 2019-04-10 MED ORDER — TRAVASOL 10 % IV SOLN
INTRAVENOUS | Status: DC
  Filled 2019-04-10: qty 480

## 2019-04-10 MED ORDER — LIDOCAINE HCL (PF) 1 % IJ SOLN
INTRAMUSCULAR | Status: AC | PRN
  Administered 2019-04-10: 10 mL

## 2019-04-10 MED ORDER — SODIUM CHLORIDE 0.9% FLUSH
10.0000 mL | INTRAVENOUS | Status: DC | PRN
  Administered 2019-04-11 – 2019-04-26 (×3): 10 mL
  Filled 2019-04-10 (×3): qty 40

## 2019-04-10 MED ORDER — SODIUM CHLORIDE 0.9% FLUSH
10.0000 mL | Freq: Three times a day (TID) | INTRAVENOUS | Status: DC
  Administered 2019-04-10 – 2019-04-17 (×17): 10 mL

## 2019-04-10 MED ORDER — POTASSIUM CHLORIDE IN NACL 40-0.9 MEQ/L-% IV SOLN
INTRAVENOUS | Status: AC
  Administered 2019-04-10: 75 mL/h via INTRAVENOUS
  Filled 2019-04-10 (×2): qty 1000

## 2019-04-10 MED ORDER — DEXTROSE 50 % IV SOLN
12.5000 g | INTRAVENOUS | Status: AC
  Administered 2019-04-10: 12.5 g via INTRAVENOUS
  Filled 2019-04-10: qty 50

## 2019-04-10 MED ORDER — POTASSIUM CHLORIDE 10 MEQ/100ML IV SOLN
10.0000 meq | INTRAVENOUS | Status: AC
  Administered 2019-04-10 (×4): 10 meq via INTRAVENOUS
  Filled 2019-04-10 (×3): qty 100

## 2019-04-10 NOTE — Progress Notes (Signed)
PHARMACY - ADULT TOTAL PARENTERAL NUTRITION CONSULT NOTE   Pharmacy Consult for TPN Indication: intolerance to enteral feedings, diverticulitis  Patient Measurements: Height: 5\' 4"  (162.6 cm) Weight: 176 lb 2.4 oz (79.9 kg) IBW/kg (Calculated) : 54.7 TPN AdjBW (KG): 61 Body mass index is 30.24 kg/m.  Insulin Requirements: n/a  Current Nutrition: now NPO  IVF: NS with 70mEq KCL at 75 ml/hr  Central access:  PICC to be placed 6/9 TPN start date: 6/9  ASSESSMENT                                                                                                          HPI: 53 yo female with sigmoid diverticulitis with abscesses, now worsening to have IR place drain today. Patient has been intolerant to enteral feedings for about a week now so plan to place PICC line and start TPN per [facility] orders today  Significant events:   Today, 04/10/19  Glucose - TBD  Electrolytes - stable except K low at 3 - KCL IV already ordered by Md  Renal - SCr 0.51  LFTs - will check tomorrow  TGs - will check tomorrow  Prealbumin - will check tomorrow  NUTRITIONAL GOALS                                                                                             RD recs: pending   Custom TPN to provide 90g protein per day and 1818 kcal/day at goal rate of 75 ml/hr   PLAN                                                                                                                         At 1800 today:  Start Custom TPN at rate of 40 ml/hr  TPN will contain standard electrolytes except: increase Na to 75, K to 65, and make Cl:Acet 1:2  Plan to advance as tolerated to the goal rate.  TPN to contain standard multivitamins and trace elements.  Reduce IVF to 13ml/hr.  Add CBG/sensitive scale SSI q6h  TPN lab panels on Mondays & Thursdays.  F/u daily.    Adrian Saran, PharmD, BCPS 04/10/2019 11:02  AM

## 2019-04-10 NOTE — Progress Notes (Signed)
PT Cancellation Note  Patient Details Name: Molly Beck MRN: [id number] DOB: 1966-06-29   Cancelled Treatment:      CT Guided Drainage of abdominopelvic abscesses x 2 Will attempt to see another day as schedule permits   Rica Koyanagi  PTA Acute  Rehabilitation Services Pager      [phone] Office      [phone]

## 2019-04-10 NOTE — Progress Notes (Signed)
Red rash found on patients back with raised welts. Pt states that it is itchy. MD on call notified and orders placed.

## 2019-04-10 NOTE — Progress Notes (Signed)
[facility] Progress Note     Subjective: CC-  Feels the same as yesterday. Continues to have intermittent lower abdominal pain. Denies n/v. Passing some flatus. CT yesterday showed worsening sigmoid diverticulitis with 2 new abscesses. IR to place drain today.  Objective: Vital signs in last 24 hours: Temp:  [98.6 F (37 C)-98.9 F (37.2 C)] 98.6 F (37 C) (06/09 0457) Pulse Rate:  [105-108] 107 (06/09 0457) Resp:  [16-18] 18 (06/09 0457) BP: (121-134)/(86-94) 129/91 (06/09 0457) SpO2:  [97 %-100 %] 97 % (06/09 0457) Last BM Date: 04/05/19  Intake/Output from previous day: 06/08 0701 - 06/09 0700 In: 5339.1 [P.O.:170; I.V.:4361.9; IV Piggyback:807.3] Out: -  Intake/Output this shift: No intake/output data recorded.  PE: Gen:  Alert, NAD, pleasant HEENT: EOM's intact, pupils equal and round Card:  RRR Pulm:  CTAB, no W/R/R, effort normal Abd: Soft, mild distension, +BS, no HSM, TTP LLQ without peritonitis Ext:  Splint to RLE. L calf soft and nontender Psych: A&Ox3  Skin: no rashes noted, warm and dry   Lab Results:  Recent Labs    04/09/19 0500 04/10/19 0456  WBC 13.4* 10.4  HGB 9.8* 9.3*  HCT 30.0* 28.9*  PLT 287 300   BMET Recent Labs    04/09/19 0500 04/10/19 0456  NA 137 135  K 3.4* 3.0*  CL 109 109  CO2 16* 14*  GLUCOSE 72 66*  BUN 6 <5*  CREATININE 0.52 0.51  CALCIUM 7.7* 7.6*   PT/INR Recent Labs    04/10/19 0456  LABPROT 15.3*  INR 1.2   CMP     Component Value Date/Time   NA 135 04/10/2019 0456   K 3.0 (L) 04/10/2019 0456   CL 109 04/10/2019 0456   CO2 14 (L) 04/10/2019 0456   GLUCOSE 66 (L) 04/10/2019 0456   BUN <5 (L) 04/10/2019 0456   CREATININE 0.51 04/10/2019 0456   CALCIUM 7.6 (L) 04/10/2019 0456   PROT 7.7 04/04/2019 2100   ALBUMIN 4.2 04/04/2019 2100   AST 20 04/04/2019 2100   ALT 18 04/04/2019 2100   ALKPHOS 101 04/04/2019 2100   BILITOT 0.8 04/04/2019 2100   GFRNONAA >60 04/10/2019 0456   GFRAA  >60 04/10/2019 0456   Lipase     Component Value Date/Time   LIPASE 25 09/12/2015 1615       Studies/Results: Ct Abdomen Pelvis W Contrast  Result Date: 04/09/2019 CLINICAL DATA:  Elevated white blood cell count and patient with abdominal pain for 1 week. EXAM: CT ABDOMEN AND PELVIS WITH CONTRAST TECHNIQUE: Multidetector CT imaging of the abdomen and pelvis was performed using the standard protocol following bolus administration of intravenous contrast. CONTRAST:  100 mL OMNIPAQUE IOHEXOL 300 MG/ML  SOLN COMPARISON:  CT abdomen pelvis 04/04/2019. FINDINGS: Lower chest: Heart size is normal. No pleural or pericardial effusion. Minimal right basilar atelectasis noted. Hepatobiliary: The gallbladder is distended but otherwise unremarkable. The liver and biliary tree appear normal. Pancreas: Unremarkable. No pancreatic ductal dilatation or surrounding inflammatory changes. Spleen: Normal in size without focal abnormality. Adrenals/Urinary Tract: Small cyst lower pole right kidney incidentally noted. The kidneys otherwise appear normal. Ureters and urinary bladder are normal in appearance. Stomach/Bowel: Extensive inflammatory change about the sigmoid colon has markedly worsened. There is an air and fluid collection posterior and to the right of the uterus which measures approximately 6.5 cm transverse by 9 cm AP by 5 cm craniocaudal consistent with an abscess. A second air and fluid collection consistent with abscess more superiorly  in the central pelvis measures 4 cm AP by 3.5 cm transverse by 7.5 cm craniocaudal. This collection is contains predominantly gas and it overlies a loop of small bowel which appears inflamed. Presacral edema is identified. The walls of the sigmoid colon are markedly thickened inflamed. The colon otherwise appears normal. The stomach, small bowel and appendix are normal in appearance. Vascular/Lymphatic: No significant vascular findings are present. No enlarged abdominal or  pelvic lymph nodes. Reproductive: Uterus and bilateral adnexa are unremarkable. IUD noted. Other: None. Musculoskeletal: No acute or focal bony abnormality. Severe left hip osteoarthritis noted. IMPRESSION: Marked worsening findings in the pelvis most consistent with acute sigmoid diverticulitis with 2 new abscesses identified as described above. These results were called by telephone at the time of interpretation on 04/09/2019 at 2:03 pm to [facility] LLC[facility]ILL[facility] JENNINGS, P.A., Who verbally acknowledged these results. Electronically Signed   By: Drusilla Kannerhomas  Dalessio M.D.   On: 04/09/2019 14:07   [place] Ekg Site Rite  Result Date: 04/10/2019 If Site Rite image not attached, placement could not be confirmed due to current cardiac rhythm.   Anti-infectives: Anti-infectives (From admission, onward)   Start     Dose/Rate Route Frequency Ordered Stop   04/05/19 0600  piperacillin-tazobactam (ZOSYN) IVPB 3.375 g     3.375 g 12.5 mL/hr over 240 Minutes Intravenous Every 8 hours 04/05/19 0121     04/05/19 0000  piperacillin-tazobactam (ZOSYN) IVPB 3.375 g     3.375 g 100 mL/hr over 30 Minutes Intravenous  Once 04/04/19 2355 04/05/19 0119       Assessment/Plan GERD Osteoarthritis - takes meloxicam and tramadol at baseline Recent right ankle surgery by Dr. Victorino DikeHewitt 03/27/2019  Sigmoid diverticulitis with abscesses - no prior history of diverticulitis - never had a colonoscopy before - CT scan 6/3 showed acute sigmoid colitis with small foci of adjacent gasindicative ofmicro perforation, no abscess or drainable fluid collection - CT 6/9 showed worsening sigmoid diverticulitis with 2 new abscesses  ID -zosyn 6/4>> VTE -SCD, lovenox FEN -IVF,NPO Foley -none Follow up -TBD  Plan: IR to place drain today. Will also order PICC/TPN as it has been about 1 week since she has had a meal to eat. Continue IV zosyn.   LOS: 5 days    Franne FortsBrooke A Janeene Sand , [facility] 04/10/2019, 8:21 AM Pager:  [phone] Mon-Thurs 7:00 am-4:30 pm Fri 7:00 am -11:30 AM Sat-Sun 7:00 am-11:30 am

## 2019-04-10 NOTE — Progress Notes (Signed)
Initial Nutrition Assessment  RD working remotely.   DOCUMENTATION CODES:   Not applicable  INTERVENTION:  - TPN/TPN advancement per Pharmacy.   NUTRITION DIAGNOSIS:   Inadequate oral intake related to inability to eat as evidenced by NPO status.  GOAL:   Patient will meet greater than or equal to 90% of their needs  MONITOR:   Diet advancement, Labs, Weight trends, Other (Comment)(TPN regimen)  REASON FOR ASSESSMENT:   Consult New TPN/TNA  ASSESSMENT:   53 y.o. female with medical history significant of GERD. She had elective reconstructive surgery of R foot a week ago. She presented to the ED for evaluation of abdominal pain. Patient reported constipation d/t pain medications since surgery. She was experiencing LLQ abdominal pain for several days which became worse in the 24 hours PTA; sharp, excruciating. CT abdomen/pelvis showed acute sigmoid colitis with small foci of adjacent gas that may be extra-luminal and may indicate micro-perforation. No abscess or drainable fluid collection at the time of admission. General surgery was consulted.  Patient admitted 6/4 and diet was NPO at that time until being advanced to CLD on 6/6 at 9:05 AM. Patient did not consume anything PO between that time and diet again being NPO on 6/6 at 3:45 PM.   Patient reports that PTA she had a good appetite/appetite at baseline until a few days PTA when she began experiencing constipation which led to abdominal pressure/discomfort. She was still able to eat and drink until the day PTA when pain became severe, but intakes were lower in volume for the few days PTA. Per chart review, current weight is 176 lb and weight has been stable for ~2 years.  Double lumen PICC placed in R brachial today and plan to initiate custom TPN @ 40 ml/hr this evening.   Per notes: diverticulitis of the colon, s/p CT-guided drainage of abdominopelvic abscesses x2 by IR this afternoon, CT today showed worsening of  diverticulitis compared to CT on 6/3.    Medications reviewed; sliding scale novolog.  Labs reviewed; K: 3 mmol/l, BUN: <5 mg/dl, Ca: 7.6 mg/dl. IVF; NS-40 mEq KCl @ 75 ml/hr to decrease to 35 ml/hr with initiation of TPN.      NUTRITION - FOCUSED PHYSICAL EXAM:  unable to complet at this time.   Diet Order:   Diet Order            Diet NPO time specified Except for: Sips with Meds  Diet effective midnight              EDUCATION NEEDS:   No education needs have been identified at this time  Skin:  Skin Assessment: Reviewed RN Assessment  Last BM:  6/4  Height:   Ht Readings from Last 1 Encounters:  04/05/19 5\' 4"  (1.626 m)    Weight:   Wt Readings from Last 1 Encounters:  04/05/19 79.9 kg    Ideal Body Weight:  54.5 kg  BMI:  Body mass index is 30.24 kg/m.  Estimated Nutritional Needs:   Kcal:  2000-2240 kcal  Protein:  95-105 grams  Fluid:  >/= 2 L/day     Jarome Matin, MS, RD, LDN, [facility] In[facility] Clinical Dietitian Pager # [phone] After hours/weekend pager # [phone]

## 2019-04-10 NOTE — Procedures (Signed)
Interventional Radiology Procedure Note  Procedure: CT Guided Drainage of abdominopelvic abscesses x 2  Complications: None  Estimated Blood Loss: < 10 mL  Findings: 10 Fr drain placed in central diverticular abscess from right anterior approach with return of purulent, feculent fluid. Fluid sample sent for culture analysis. Drain attached to suction bulb drainage.  10 Fr drain placed in right pelvic abscess from right posterior transgluteal approach with return of purulent, feculent fluid. Fluid sample sent for culture analysis. Drain attached to suction bulb drainage.  Will follow.  Venetia Night. Kathlene Cote, M.D Pager:  [phone]

## 2019-04-10 NOTE — Progress Notes (Signed)
Hypoglycemic Event  CBG: 52  Treatment:12.5g of dextrose IV   Symptoms: asymptomatic   Follow-up CBG: Time:1726 CBG Result:110  Possible Reasons for Event: Pt NPO  Comments/MD notified:Choi, no new orders.     Molly Beck

## 2019-04-10 NOTE — Progress Notes (Signed)
.  Peripherally Inserted Central Catheter/Midline Placement  The IV Nurse has discussed with the patient and/or persons authorized to consent for the patient, the purpose of this procedure and the potential benefits and risks involved with this procedure.  The benefits include less needle sticks, lab draws from the catheter, and the patient may be discharged home with the catheter. Risks include, but not limited to, infection, bleeding, blood clot (thrombus formation), and puncture of an artery; nerve damage and irregular heartbeat and possibility to perform a PICC exchange if needed/ordered by physician.  Alternatives to this procedure were also discussed.  Bard Power PICC patient education guide, fact sheet on infection prevention and patient information card has been provided to patient /or left at bedside.    PICC/Midline Placement Documentation  PICC Double Lumen 04/10/19 PICC Right Brachial 39 cm 0 cm (Active)  Indication for Insertion or Continuance of Line Administration of hyperosmolar/irritating solutions (i.e. TPN, Vancomycin, etc.) 04/10/2019 11:49 AM  Exposed Catheter (cm) 0 cm 04/10/2019 11:49 AM  Site Assessment Clean;Dry;Intact 04/10/2019 11:49 AM  Lumen #1 Status Flushed;Saline locked;Blood return noted 04/10/2019 11:49 AM  Lumen #2 Status Flushed;Saline locked;Blood return noted 04/10/2019 11:49 AM  Dressing Type Transparent 04/10/2019 11:49 AM  Dressing Status Clean;Dry;Intact;Antimicrobial disc in place 04/10/2019 11:49 AM  Dressing Change Due 04/17/19 04/10/2019 11:49 AM       Gordan Payment 04/10/2019, 11:51 AM

## 2019-04-10 NOTE — Progress Notes (Signed)
PROGRESS NOTE    Molly Beck  XLK:[id number] DOB: 1966/01/14 DOA: 04/04/2019 PCP: Clementeen Graham, [name]     Brief Narrative:  Molly Beck is a 53 y.o. female with medical history significant of GERD, elective surgery of her right foot a week ago presenting to the hospital for evaluation of abdominal pain.  Patient states she had a reconstructive right foot surgery done a week ago.  States since then she has been constipated due to taking pain medications.  Her last bowel movement was prior to the surgery.  For the past few days she is having left lower quadrant abdominal pain which became worse for the past 24 hours.  The pain is sharp and excruciating.  States today she called her surgeon's office and was advised to take magnesium citrate for constipation.  She vomited soon after taking magnesium citrate.  Denies any fevers.  She continues to complain of abdominal pain despite receiving a dose of morphine and multiple doses of fentanyl in the ED. CT abdomen pelvis showing acute sigmoid colitis with small foci of adjacent gas that may be extra luminal and may indicate microperforation.  No abscess or drainable fluid collection. She was admitted for further treatment and care.  General surgery was consulted.  New events last 24 hours / Subjective: Continues to have some lower abdominal pain, very little flatus, no bowel movement.  Remains n.p.o.  No fevers overnight.  Assessment & Plan:   Principal Problem:   Perforated sigmoid colon  Active Problems:   Colitis   Sepsis (HCC)   Hypokalemia   H/O foot surgery   Sepsis secondary to acute sigmoid colitis with microperforation -Sepsis present on admission -Blood cultures negative to date -Appreciate general surgery  -Continue IV fluid -Continue IV Zosyn -Repeat CT abdomen pelvis revealed marked worsening findings in the pelvis with 2 new abscesses for IR drainage today -General surgery has also ordered TPN  Rash  -Maculopapular rash bilateral distribution on her back. Could be due to heat/sweat rash or contact from bedsheets.  She has tolerated IV Zosyn for the past week without any signs of allergic reaction.  This does not appear to be an reaction to Zosyn at this time.  Ordered IV Benadryl and hydrocortisone cream.  The patient counseled on continued hygiene and change of bed sheets daily  Recent right foot surgery -Currently in a cast, plans to follow-up with her orthopedic surgeon as an outpatient -PT evaluation, nonweightbearing right lower extremity  Hypokalemia -Replace potassium and trend   DVT prophylaxis: Lovenox Code Status: Full Family Communication: None Disposition Plan: IR for abscess drainage today   Consultants:   General surgery  IR  Procedures:   None  Antimicrobials:  Anti-infectives (From admission, onward)   Start     Dose/Rate Route Frequency Ordered Stop   04/05/19 0600  piperacillin-tazobactam (ZOSYN) IVPB 3.375 g     3.375 g 12.5 mL/hr over 240 Minutes Intravenous Every 8 hours 04/05/19 0121     04/05/19 0000  piperacillin-tazobactam (ZOSYN) IVPB 3.375 g     3.375 g 100 mL/hr over 30 Minutes Intravenous  Once 04/04/19 2355 04/05/19 0119       Objective: Vitals:   04/09/19 0436 04/09/19 1404 04/09/19 2156 04/10/19 0457  BP: 124/79 121/86 (!) 134/94 (!) 129/91  Pulse: (!) 105 (!) 108 (!) 105 (!) 107  Resp: Temp: 98.6 F (37 C) 98.9 F (37.2 C) 98.8 F (37.1 C) 98.6 F (37 C)  TempSrc:  Oral Oral Oral Oral  SpO2: 96% 99% 100% 97%  Weight:      Height:        Intake/Output Summary (Last 24 hours) at 04/10/2019 1052 Last data filed at 04/10/2019 0942 Gross per 24 hour  Intake 2094.75 ml  Output -  Net 2094.75 ml   Filed Weights   04/05/19 0953  Weight: 79.9 kg    Examination: General exam: Appears calm and comfortable  Respiratory system: Clear to auscultation. Respiratory effort normal. Cardiovascular system: S1 & S2 heard,  RRR. No JVD, murmurs, rubs, gallops or clicks. No pedal edema. Gastrointestinal system: Abdomen is nondistended, soft and tender to palpation left lower quadrant and pelvic Central nervous system: Alert and oriented. No focal neurological deficits. Extremities: Symmetric 5 x 5 power. Skin: + Maculopapular rash overlying her back Psychiatry: Judgement and insight appear normal. Mood & affect appropriate.    Data Reviewed: I have personally reviewed following labs and imaging studies  CBC: Recent Labs  Lab 04/04/19 2100 04/05/19 0608 04/06/19 0605 04/07/19 0600 04/08/19 0511 04/09/19 0500 04/10/19 0456  WBC 14.0* 11.7* 12.3* 12.0* 13.0* 13.4* 10.4  NEUTROABS 12.3* 10.0*  --   --   --   --   --   HGB 13.7 11.9* 10.7* 9.7* 10.2* 9.8* 9.3*  HCT 40.7 36.5 33.1* 29.8* 31.7* 30.0* 28.9*  MCV 91.3 94.3 97.1 95.2 94.6 95.2 93.5  PLT 380 319 283 245 295 287 300   Basic Metabolic Panel: Recent Labs  Lab 04/04/19 2100 04/05/19 0608 04/06/19 0605 04/07/19 0600 04/08/19 0511 04/09/19 0500 04/10/19 0456  NA 140 141 140 138 140 137 135  K 3.2* 4.1 3.5 2.9* 3.3* 3.4* 3.0*  CL 103 110 112* 109 107 109 109  CO2 24 22 23  19* 23 16* 14*  GLUCOSE 149* 133* 105* 84 87 72 66*  BUN 17 15 16 11 8 6  <5*  CREATININE 0.67 0.68 0.48 0.53 0.51 0.52 0.51  CALCIUM 9.1 8.1* 7.8* 7.5* 8.0* 7.7* 7.6*  MG 2.7* 2.3  --  1.9  --   --   --    GFR: Estimated Creatinine Clearance: 84.2 mL/min (by C-G formula based on SCr of 0.51 mg/dL). Liver Function Tests: Recent Labs  Lab 04/04/19 2100  AST 20  ALT 18  ALKPHOS 101  BILITOT 0.8  PROT 7.7  ALBUMIN 4.2   No results for input(s): LIPASE, AMYLASE in the last 168 hours. No results for input(s): AMMONIA in the last 168 hours. Coagulation Profile: Recent Labs  Lab 04/10/19 0456  INR 1.2   Cardiac Enzymes: No results for input(s): CKTOTAL, CKMB, CKMBINDEX, TROPONINI in the last 168 hours. BNP (last 3 results) No results for input(s): PROBNP in  the last 8760 hours. HbA1C: No results for input(s): HGBA1C in the last 72 hours. CBG: No results for input(s): GLUCAP in the last 168 hours. Lipid Profile: No results for input(s): CHOL, HDL, LDLCALC, TRIG, CHOLHDL, LDLDIRECT in the last 72 hours. Thyroid Function Tests: No results for input(s): TSH, T4TOTAL, FREET4, T3FREE, THYROIDAB in the last 72 hours. Anemia Panel: No results for input(s): VITAMINB12, FOLATE, FERRITIN, TIBC, IRON, RETICCTPCT in the last 72 hours. Sepsis Labs: Recent Labs  Lab 04/05/19 0253  LATICACIDVEN 1.2    Recent Results (from the past 240 hour(s))  SARS Coronavirus 2 (CEPHEID - Performed in [facility] hospital lab), Hosp Order     Status: None   Collection Time: 04/05/19 12:14 AM  Result Value Ref Range Status  SARS Coronavirus 2 NEGATIVE NEGATIVE Final    Comment: (NOTE) If result is NEGATIVE SARS-CoV-2 target nucleic acids are NOT DETECTED. The SARS-CoV-2 RNA is generally detectable in upper and lower  respiratory specimens during the acute phase of infection. The lowest  concentration of SARS-CoV-2 viral copies this assay can detect is 250  copies / mL. A negative result does not preclude SARS-CoV-2 infection  and should not be used as the sole basis for treatment or other  patient management decisions.  A negative result may occur with  improper specimen collection / handling, submission of specimen other  than nasopharyngeal swab, presence of viral mutation(s) within the  areas targeted by this assay, and inadequate number of viral copies  (<250 copies / mL). A negative result must be combined with clinical  observations, patient history, and epidemiological information. If result is POSITIVE SARS-CoV-2 target nucleic acids are DETECTED. The SARS-CoV-2 RNA is generally detectable in upper and lower  respiratory specimens dur ing the acute phase of infection.  Positive  results are indicative of active infection with SARS-CoV-2.  Clinical   correlation with patient history and other diagnostic information is  necessary to determine patient infection status.  Positive results do  not rule out bacterial infection or co-infection with other viruses. If result is PRESUMPTIVE POSTIVE SARS-CoV-2 nucleic acids MAY BE PRESENT.   A presumptive positive result was obtained on the submitted specimen  and confirmed on repeat testing.  While 2019 novel coronavirus  (SARS-CoV-2) nucleic acids may be present in the submitted sample  additional confirmatory testing may be necessary for epidemiological  and / or clinical management purposes  to differentiate between  SARS-CoV-2 and other Sarbecovirus currently known to infect humans.  If clinically indicated additional testing with an alternate test  methodology [phone]) is advised. The SARS-CoV-2 RNA is generally  detectable in upper and lower respiratory sp ecimens during the acute  phase of infection. The expected result is Negative. Fact Sheet for Patients:  StrictlyIdeas.no Fact Sheet for Healthcare Providers: BankingDealers.co.za This test is not yet approved or cleared by the [place] FDA and has been authorized for detection and/or diagnosis of SARS-CoV-2 by FDA under an Emergency Use Authorization (EUA).  This EUA will remain in effect (meaning this test can be used) for the duration of the COVID-19 declaration under Section 564(b)(1) of the Act, 21 U.S.C. section 360bbb-3(b)(1), unless the authorization is terminated or revoked sooner. Performed at [facility], [place] [address], [place], [place] [address]   Culture, blood (routine x 2)     Status: None (Preliminary result)   Collection Time: 04/05/19  1:10 AM  Result Value Ref Range Status   Specimen Description   Final    BLOOD RIGHT HAND Performed at [facility], [place] [address] [place], [place] [address]    Special Requests   Final    BOTTLES  DRAWN AEROBIC AND ANAEROBIC Blood Culture results may not be optimal due to an excessive volume of blood received in culture bottles Performed at [place] [address], [place], [place] [address]    Culture   Final    NO GROWTH 4 DAYS Performed at [facility], [place] [address] [place],  [address]    Report Status PENDING  Incomplete  Culture, blood (routine x 2)     Status: None (Preliminary result)   Collection Time: 04/05/19  2:53 AM  Result Value Ref Range Status   Specimen Description  Final    BLOOD RIGHT ANTECUBITAL Performed at [facility], [address] [place], [place] [address]    Special Requests   Final    BOTTLES DRAWN AEROBIC AND ANAEROBIC Blood Culture results may not be optimal due to an excessive volume of blood received in culture bottles Performed at [facility], [address] [place], [place] [address]    Culture   Final    NO GROWTH 4 DAYS Performed at [facility], [address] [place], [place] [address]    Report Status PENDING  Incomplete       Radiology Studies: Ct Abdomen Pelvis W Contrast  Result Date: 04/09/2019 CLINICAL DATA:  Elevated white blood cell count and patient with abdominal pain for 1 week. EXAM: CT ABDOMEN AND PELVIS WITH CONTRAST TECHNIQUE: Multidetector CT imaging of the abdomen and pelvis was performed using the standard protocol following bolus administration of intravenous contrast. CONTRAST:  100 mL OMNIPAQUE IOHEXOL 300 MG/ML  SOLN COMPARISON:  CT abdomen pelvis 04/04/2019. FINDINGS: Lower chest: Heart size is normal. No pleural or pericardial effusion. Minimal right basilar atelectasis noted. Hepatobiliary: The gallbladder is distended but otherwise unremarkable. The liver and biliary tree appear normal. Pancreas: Unremarkable. No pancreatic ductal dilatation or surrounding inflammatory changes. Spleen: Normal in size without focal abnormality.  Adrenals/Urinary Tract: Small cyst lower pole right kidney incidentally noted. The kidneys otherwise appear normal. Ureters and urinary bladder are normal in appearance. Stomach/Bowel: Extensive inflammatory change about the sigmoid colon has markedly worsened. There is an air and fluid collection posterior and to the right of the uterus which measures approximately 6.5 cm transverse by 9 cm AP by 5 cm craniocaudal consistent with an abscess. A second air and fluid collection consistent with abscess more superiorly in the central pelvis measures 4 cm AP by 3.5 cm transverse by 7.5 cm craniocaudal. This collection is contains predominantly gas and it overlies a loop of small bowel which appears inflamed. Presacral edema is identified. The walls of the sigmoid colon are markedly thickened inflamed. The colon otherwise appears normal. The stomach, small bowel and appendix are normal in appearance. Vascular/Lymphatic: No significant vascular findings are present. No enlarged abdominal or pelvic lymph nodes. Reproductive: Uterus and bilateral adnexa are unremarkable. IUD noted. Other: None. Musculoskeletal: No acute or focal bony abnormality. Severe left hip osteoarthritis noted. IMPRESSION: Marked worsening findings in the pelvis most consistent with acute sigmoid diverticulitis with 2 new abscesses identified as described above. These results were called by telephone at the time of interpretation on 04/09/2019 at 2:03 pm to [facility] Who verbally acknowledged these results. Electronically Signed   By: Drusilla Kannerhomas  Dalessio M.D.   On: 04/09/2019 14:07   [place] Ekg Site Rite  Result Date: 04/10/2019 If Site Rite image not attached, placement could not be confirmed due to current cardiac rhythm.     Scheduled Meds: . [START ON 04/11/2019] enoxaparin (LOVENOX) injection  40 mg Subcutaneous Q24H  . fluticasone  1 spray Each Nare Daily  . pantoprazole  20 mg Oral BID   Continuous Infusions: . sodium chloride  Stopped (04/05/19 1756)  . 0.9 % NaCl with KCl 40 mEq / L 75 mL/hr (04/10/19 0828)  . methocarbamol (ROBAXIN) IV    . piperacillin-tazobactam (ZOSYN)  IV 3.375 g (04/10/19 0640)  . potassium chloride 10 mEq (04/10/19 1038)     LOS: 5 days    Time spent: 25 minutes   Noralee StainJennifer Emmitt Matthews, DO [facility] [email or web address]  04/10/2019, 10:52 AM

## 2019-04-11 LAB — DIFFERENTIAL
Abs Immature Granulocytes: 0.15 10*3/uL — ABNORMAL HIGH (ref 0.00–0.07)
Basophils Absolute: 0 10*3/uL (ref 0.0–0.1)
Basophils Relative: 0 %
Eosinophils Absolute: 0.2 10*3/uL (ref 0.0–0.5)
Eosinophils Relative: 2 %
Immature Granulocytes: 2 %
Lymphocytes Relative: 15 %
Lymphs Abs: 1.4 10*3/uL (ref 0.7–4.0)
Monocytes Absolute: 0.7 10*3/uL (ref 0.1–1.0)
Monocytes Relative: 8 %
Neutro Abs: 6.7 10*3/uL (ref 1.7–7.7)
Neutrophils Relative %: 73 %

## 2019-04-11 LAB — CBC
HCT: 29.4 % — ABNORMAL LOW (ref 36.0–46.0)
Hemoglobin: 9.8 g/dL — ABNORMAL LOW (ref 12.0–15.0)
MCH: 30.2 pg (ref 26.0–34.0)
MCHC: 33.3 g/dL (ref 30.0–36.0)
MCV: 90.7 fL (ref 80.0–100.0)
Platelets: 318 10*3/uL (ref 150–400)
RBC: 3.24 MIL/uL — ABNORMAL LOW (ref 3.87–5.11)
RDW: 13.6 % (ref 11.5–15.5)
WBC: 9.2 10*3/uL (ref 4.0–10.5)
nRBC: 0 % (ref 0.0–0.2)

## 2019-04-11 LAB — PHOSPHORUS: Phosphorus: 2.1 mg/dL — ABNORMAL LOW (ref 2.5–4.6)

## 2019-04-11 LAB — COMPREHENSIVE METABOLIC PANEL
ALT: 20 U/L (ref 0–44)
AST: 18 U/L (ref 15–41)
Albumin: 2.4 g/dL — ABNORMAL LOW (ref 3.5–5.0)
Alkaline Phosphatase: 72 U/L (ref 38–126)
Anion gap: 8 (ref 5–15)
BUN: 5 mg/dL — ABNORMAL LOW (ref 6–20)
CO2: 20 mmol/L — ABNORMAL LOW (ref 22–32)
Calcium: 7.8 mg/dL — ABNORMAL LOW (ref 8.9–10.3)
Chloride: 108 mmol/L (ref 98–111)
Creatinine, Ser: 0.45 mg/dL (ref 0.44–1.00)
GFR calc Af Amer: >60 mL/min (ref >60)
GFR calc non Af Amer: >60 mL/min (ref >60)
Glucose, Bld: 131 mg/dL — ABNORMAL HIGH (ref 70–99)
Potassium: 3 mmol/L — ABNORMAL LOW (ref 3.5–5.1)
Sodium: 136 mmol/L (ref 135–145)
Total Bilirubin: 0.6 mg/dL (ref 0.3–1.2)
Total Protein: 5.6 g/dL — ABNORMAL LOW (ref 6.5–8.1)

## 2019-04-11 LAB — TRIGLYCERIDES: Triglycerides: 91 mg/dL (ref <150)

## 2019-04-11 LAB — GLUCOSE, CAPILLARY
Glucose-Capillary: 109 mg/dL — ABNORMAL HIGH (ref 70–99)
Glucose-Capillary: 110 mg/dL — ABNORMAL HIGH (ref 70–99)
Glucose-Capillary: 119 mg/dL — ABNORMAL HIGH (ref 70–99)
Glucose-Capillary: 120 mg/dL — ABNORMAL HIGH (ref 70–99)

## 2019-04-11 LAB — MAGNESIUM: Magnesium: 1.7 mg/dL (ref 1.7–2.4)

## 2019-04-11 LAB — PREALBUMIN: Prealbumin: <5 mg/dL — ABNORMAL LOW (ref 18–38)

## 2019-04-11 MED ORDER — MAGNESIUM SULFATE 2 GM/50ML IV SOLN
2.0000 g | Freq: Once | INTRAVENOUS | Status: AC
  Administered 2019-04-11: 08:00:00 2 g via INTRAVENOUS
  Filled 2019-04-11: qty 50

## 2019-04-11 MED ORDER — SODIUM PHOSPHATES 45 MMOLE/15ML IV SOLN
10.0000 mmol | Freq: Once | INTRAVENOUS | Status: AC
  Administered 2019-04-11: 10 mmol via INTRAVENOUS
  Filled 2019-04-11: qty 3.33

## 2019-04-11 MED ORDER — TRAVASOL 10 % IV SOLN
INTRAVENOUS | Status: AC
  Administered 2019-04-11: 18:00:00 via INTRAVENOUS
  Filled 2019-04-11: qty 792

## 2019-04-11 MED ORDER — POTASSIUM CHLORIDE 10 MEQ/100ML IV SOLN
10.0000 meq | INTRAVENOUS | Status: AC
  Administered 2019-04-11 (×6): 10 meq via INTRAVENOUS
  Filled 2019-04-11 (×6): qty 100

## 2019-04-11 MED ORDER — SODIUM CHLORIDE 0.9 % IV SOLN
INTRAVENOUS | Status: DC
  Administered 2019-04-11: 17:00:00 via INTRAVENOUS

## 2019-04-11 NOTE — Progress Notes (Signed)
Referring Physician(s): Dr. Derrell LollingIngram  Supervising Physician: Richarda OverlieHenn, Molly  [facility]  [facility]  Chief Complaint: Sigmoid diverticulitis with abscesses  Subjective: Tender at drain sites.  TG drain is uncomfortable to position.  However, overall feeling better.  Had a BM this AM.   Allergies: Codeine  Medications: Prior to Admission medications   Medication Sig Start Date End Date Taking? Authorizing Provider  acetaminophen (TYLENOL) 325 MG tablet Take 650 mg by mouth every 6 (six) hours as needed (for back pain/pain.).   Yes [provider]  ALPRAZolam (XANAX) 0.25 MG tablet Take 0.25 mg by mouth 2 (two) times daily as needed for anxiety.   Yes [provider]  ASPIRIN ADULT LOW STRENGTH 81 MG EC tablet Take 81 mg by mouth daily. 03/27/19  Yes [provider]  cetirizine (ZYRTEC) 10 MG tablet Take 10 mg by mouth daily.   Yes [provider]  eletriptan (RELPAX) 40 MG tablet Take 40 mg by mouth as needed for migraine or headache. May repeat in 2 hours if headache persists or recurs.   Yes [provider]  fluticasone (FLONASE) 50 MCG/ACT nasal spray Place 1 spray into both nostrils daily.   Yes [provider]  Homeopathic Products [facility](SIMILASAN ALLERGY EYE RELIEF OP) Apply to eye.   Yes [provider]  ibuprofen (ADVIL,MOTRIN) 200 MG tablet Take 600 mg by mouth 2 (two) times daily as needed (for pain.).   Yes [provider]  LACTASE PO Take 5 mg by mouth daily as needed (for milk consumption).   Yes [provider]  meloxicam (MOBIC) 15 MG tablet Take 15 mg by mouth daily as needed for muscle spasms. 03/14/19  Yes [provider]  montelukast (SINGULAIR) 10 MG tablet Take 10 mg by mouth daily at 2 PM. 04/11/17  Yes [provider]  ondansetron (ZOFRAN) 4 MG tablet Take 4 mg by mouth daily as needed for nausea. 03/27/19  Yes [provider]  traMADol (ULTRAM) 50 MG tablet  Take 25-50 mg by mouth 3 (three) times daily as needed (for back pain.).   Yes [provider]  valACYclovir (VALTREX) 500 MG tablet Take 500 mg by mouth 2 (two) times daily as needed. For cold sores 03/24/17  Yes [provider]  oxyCODONE (OXY IR/ROXICODONE) 5 MG immediate release tablet Take 5 mg by mouth every 4 (four) hours as needed for pain. 03/27/19   [provider]     Vital Signs: BP 133/87 (BP Location: Left Arm)    Pulse (!) 113    Temp 98.3 F (36.8 C) (Oral)    Resp 18    Ht 5\' 4"  (1.626 m)    Wt 173 lb 15.1 oz (78.9 kg)    SpO2 97%    BMI 29.86 kg/m   Physical Exam  NAD, alert Abdomen:  RLQ drain in place.  Insertion site with some bruising, tenderness.  Otherwise c/d/i. Significant purulent output. R TG drain intact with flecked brown output.   Imaging: Ct Abdomen Pelvis W Contrast  Result Date: 04/09/2019 CLINICAL DATA:  Elevated white blood cell count and patient with abdominal pain for 1 week. EXAM: CT ABDOMEN AND PELVIS WITH CONTRAST TECHNIQUE: Multidetector CT imaging of the abdomen and pelvis was performed using the standard protocol following bolus administration of intravenous contrast. CONTRAST:  100 mL OMNIPAQUE IOHEXOL 300 MG/ML  SOLN COMPARISON:  CT abdomen pelvis 04/04/2019. FINDINGS: Lower chest: Heart size is normal. No pleural or pericardial  effusion. Minimal right basilar atelectasis noted. Hepatobiliary: The gallbladder is distended but otherwise unremarkable. The liver and biliary tree appear normal. Pancreas: Unremarkable. No pancreatic ductal dilatation or surrounding inflammatory changes. Spleen: Normal in size without focal abnormality. Adrenals/Urinary Tract: Small cyst lower pole right kidney incidentally noted. The kidneys otherwise appear normal. Ureters and urinary bladder are normal in appearance. Stomach/Bowel: Extensive inflammatory change about the sigmoid colon has markedly worsened. There is an air and fluid collection  posterior and to the right of the uterus which measures approximately 6.5 cm transverse by 9 cm AP by 5 cm craniocaudal consistent with an abscess. A second air and fluid collection consistent with abscess more superiorly in the central pelvis measures 4 cm AP by 3.5 cm transverse by 7.5 cm craniocaudal. This collection is contains predominantly gas and it overlies a loop of small bowel which appears inflamed. Presacral edema is identified. The walls of the sigmoid colon are markedly thickened inflamed. The colon otherwise appears normal. The stomach, small bowel and appendix are normal in appearance. Vascular/Lymphatic: No significant vascular findings are present. No enlarged abdominal or pelvic lymph nodes. Reproductive: Uterus and bilateral adnexa are unremarkable. IUD noted. Other: None. Musculoskeletal: No acute or focal bony abnormality. Severe left hip osteoarthritis noted. IMPRESSION: Marked worsening findings in the pelvis most consistent with acute sigmoid diverticulitis with 2 new abscesses identified as described above. These results were called by telephone at the time of interpretation on 04/09/2019 at 2:03 pm to [facility] Who verbally acknowledged these results. Electronically Signed   By: Inge Rise M.D.   On: 04/09/2019 14:07   Ct Image Guided Drainage By Percutaneous Catheter  Result Date: 04/10/2019 CLINICAL DATA:  Sigmoid diverticulitis with rupture and abscesses in the central upper pelvis and deep lower posterior pelvis. EXAM: 1. CT GUIDED CATHETER DRAINAGE OF PERITONEAL DIVERTICULAR ABSCESS 2. CT-GUIDED CATHETER DRAINAGE OF PELVIC PERITONEAL ABSCESS ANESTHESIA/SEDATION: 4.0 mg IV Versed 100 mcg IV Fentanyl Total Moderate Sedation Time:  52 minutes The patient's level of consciousness and physiologic status were continuously monitored during the procedure by Radiology nursing. PROCEDURE: The procedure, risks, benefits, and alternatives were explained to the patient.  Questions regarding the procedure were encouraged and answered. The patient understands and consents to the procedure. A time out was performed prior to initiating the procedure. The anterior abdominal wall followed by the right transgluteal region were prepped with chlorhexidine in a sterile fashion, and a sterile drape was applied covering the operative field. A sterile gown and sterile gloves were used for the procedure. Local anesthesia was provided with 1% Lidocaine. An 18 gauge trocar needle was advanced from a right anterior approach to the level of a sigmoid diverticular abscess. After confirming needle tip position, aspiration was performed. A guidewire was advanced and the needle removed. The tract was dilated over the wire. A 10 French percutaneous drainage catheter was then advanced over the wire and formed. Catheter position was confirmed by CT. Additional fluid sample was aspirated from the drain. The drainage catheter was then flushed with saline and attached to a suction bulb. A Prolene retention suture was applied at the skin exit site. The patient was then placed in a prone position and from a right transgluteal approach, an 18 gauge trocar needle was advanced to the level a second posterior lower pelvic abscess abscess. After confirming needle tip position, aspiration was performed. A guidewire was advanced and the needle removed. The tract was dilated over the wire. A 10 French percutaneous drainage  catheter was then advanced over the wire and formed. Catheter position was confirmed by CT. Additional fluid sample was aspirated from the drain. The drainage catheter was then flushed with saline and attached to a suction bulb. A Prolene retention suture was applied at the skin exit site. COMPLICATIONS: None FINDINGS: There was an anterior approach available to the level of the central lower abdominal sigmoid diverticular abscess. Aspiration yielded feculent material. Additional aspiration via a  drainage catheter yielded purulent and feculent fluid. A 10 French drain was formed in the abscess cavity. Persistent posterior pelvic abscess was able to be accessed from a right transgluteal approach. Aspiration yielded feculent and purulent fluid. A 10 French drain was formed in the abscess. IMPRESSION: CT-guided percutaneous drainage of 2 separate peritoneal abscess collections related to ruptured sigmoid diverticulitis. A 10 French drain was placed from an anterior approach into the sigmoid diverticular abscess. A second 10 French drain was placed from a posterior approach into the posterior deep pelvic abscess. Both drains were attached to suction bulbs. Electronically Signed   By: Irish LackGlenn  Yamagata M.D.   On: 04/10/2019 16:41   Ct Image Guided Drainage By Percutaneous Catheter  Result Date: 04/10/2019 CLINICAL DATA:  Sigmoid diverticulitis with rupture and abscesses in the central upper pelvis and deep lower posterior pelvis. EXAM: 1. CT GUIDED CATHETER DRAINAGE OF PERITONEAL DIVERTICULAR ABSCESS 2. CT-GUIDED CATHETER DRAINAGE OF PELVIC PERITONEAL ABSCESS ANESTHESIA/SEDATION: 4.0 mg IV Versed 100 mcg IV Fentanyl Total Moderate Sedation Time:  52 minutes The patient's level of consciousness and physiologic status were continuously monitored during the procedure by Radiology nursing. PROCEDURE: The procedure, risks, benefits, and alternatives were explained to the patient. Questions regarding the procedure were encouraged and answered. The patient understands and consents to the procedure. A time out was performed prior to initiating the procedure. The anterior abdominal wall followed by the right transgluteal region were prepped with chlorhexidine in a sterile fashion, and a sterile drape was applied covering the operative field. A sterile gown and sterile gloves were used for the procedure. Local anesthesia was provided with 1% Lidocaine. An 18 gauge trocar needle was advanced from a right anterior approach  to the level of a sigmoid diverticular abscess. After confirming needle tip position, aspiration was performed. A guidewire was advanced and the needle removed. The tract was dilated over the wire. A 10 French percutaneous drainage catheter was then advanced over the wire and formed. Catheter position was confirmed by CT. Additional fluid sample was aspirated from the drain. The drainage catheter was then flushed with saline and attached to a suction bulb. A Prolene retention suture was applied at the skin exit site. The patient was then placed in a prone position and from a right transgluteal approach, an 18 gauge trocar needle was advanced to the level a second posterior lower pelvic abscess abscess. After confirming needle tip position, aspiration was performed. A guidewire was advanced and the needle removed. The tract was dilated over the wire. A 10 French percutaneous drainage catheter was then advanced over the wire and formed. Catheter position was confirmed by CT. Additional fluid sample was aspirated from the drain. The drainage catheter was then flushed with saline and attached to a suction bulb. A Prolene retention suture was applied at the skin exit site. COMPLICATIONS: None FINDINGS: There was an anterior approach available to the level of the central lower abdominal sigmoid diverticular abscess. Aspiration yielded feculent material. Additional aspiration via a drainage catheter yielded purulent and feculent fluid. A 10  [place] drain was formed in the abscess cavity. Persistent posterior pelvic abscess was able to be accessed from a right transgluteal approach. Aspiration yielded feculent and purulent fluid. A 10 French drain was formed in the abscess. IMPRESSION: CT-guided percutaneous drainage of 2 separate peritoneal abscess collections related to ruptured sigmoid diverticulitis. A 10 French drain was placed from an anterior approach into the sigmoid diverticular abscess. A second 10 French drain was  placed from a posterior approach into the posterior deep pelvic abscess. Both drains were attached to suction bulbs. Electronically Signed   By: Irish LackGlenn  Yamagata M.D.   On: 04/10/2019 16:41   [place] Ekg Site Rite  Result Date: 04/10/2019 If Site Rite image not attached, placement could not be confirmed due to current cardiac rhythm.   Labs:  CBC: Recent Labs    04/08/19 0511 04/09/19 0500 04/10/19 0456 04/11/19 0531  WBC 13.0* 13.4* 10.4 9.2  HGB 10.2* 9.8* 9.3* 9.8*  HCT 31.7* 30.0* 28.9* 29.4*  PLT 295 287 300 318    COAGS: Recent Labs    04/10/19 0456  INR 1.2    BMP: Recent Labs    04/08/19 0511 04/09/19 0500 04/10/19 0456 04/11/19 0531  NA 140 137 135 136  K 3.3* 3.4* 3.0* 3.0*  CL 107 109 109 108  CO2 23 16* 14* 20*  GLUCOSE 87 72 66* 131*  BUN 8 6 <5* 5*  CALCIUM 8.0* 7.7* 7.6* 7.8*  CREATININE 0.51 0.52 0.51 0.45  GFRNONAA >60 >60 >60 >60  GFRAA >60 >60 >60 >60    LIVER FUNCTION TESTS: Recent Labs    04/04/19 2100 04/11/19 0531  BILITOT 0.8 0.6  AST 20 18  ALT 18 20  ALKPHOS 101 72  PROT 7.7 5.6*  ALBUMIN 4.2 2.4*    Assessment and Plan: Sigmoid diverticulitis with abscesses s/p drain placement x2 with Dr. Fredia SorrowYamagata 04/10/19 Patient improved after drain placement yesterday.  WBC WNL. Afebrile.  Cultures pending. Abdominal pain improved, drain sites tender.  Flushing TID. Continue current drain care.   Electronically Signed: Hoyt KochKacie Sue-Ellen Jema Deegan, PA 04/11/2019, 1:58 PM   I spent a total of 15 Minutes at the the patient's bedside AND on the patient's hospital floor or unit, greater than 50% of which was counseling/coordinating care for sigmoid diverticulitis with abscesses.

## 2019-04-11 NOTE — Progress Notes (Signed)
PT Cancellation Note  Patient Details Name: Molly Beck MRN: [id number] DOB: 07-Jun-1966   Cancelled Treatment:    Reason Eval/Treat Not Completed: Pain limiting ability to participate. Pt had placement of 2 drains yesterday and now reports excruciating pain at drainage sites when she gets up to use bedside commode. She stated she's not able to tolerate activity at present. Will check back tomorrow.   Blondell Reveal Kistler PT 04/11/2019  Acute Rehabilitation Services Pager [phone] Office [phone]

## 2019-04-11 NOTE — Progress Notes (Signed)
Subjective: Stable and alert. 2 percutaneous drains placed yesterday, 1 RLQ and one right transgluteal She is having a lot of pain from the drains but is otherwise stable Had a bowel movement WBC back down to 9200  Objective: Vital signs in last 24 hours: Temp:  [98.6 F (37 C)-99.3 F (37.4 C)] 98.9 F (37.2 C) (06/10 0512) Pulse Rate:  [105-115] 115 (06/10 0512) Resp:  [13-20] 16 (06/10 0512) BP: (116-152)/(77-97) 128/84 (06/10 0512) SpO2:  [94 %-100 %] 94 % (06/10 0512) Last BM Date: 04/05/19  Intake/Output from previous day: 06/09 0701 - 06/10 0700 In: 1653.4 [I.V.:1214; IV Piggyback:399.5] Out: 720 [Urine:650; Drains:70] Intake/Output this shift: Total I/O In: 0  Out: 815 [Urine:800; Drains:15]  PE: Gen: Alert, NAD, pleasant HEENT: EOM's intact, pupils equal and round Card: RRR Pulm: CTAB, no W/R/R, effort normal Abd: Soft,mild distension, +BS, no HSM,TTP LLQ without peritonitis.  Drain in RLQ and drain right transgluteal, both draining cloudy brown fluid, low volume. WUJ:WJXBJY to RLE. L calf soft and nontender Psych: A&Ox3  Skin: no rashes noted, warm and dry     Lab Results:  Recent Labs    04/10/19 0456 04/11/19 0531  WBC 10.4 9.2  HGB 9.3* 9.8*  HCT 28.9* 29.4*  PLT 300 318   BMET Recent Labs    04/10/19 0456 04/11/19 0531  NA 135 136  K 3.0* 3.0*  CL 109 108  CO2 14* 20*  GLUCOSE 66* 131*  BUN <5* 5*  CREATININE 0.51 0.45  CALCIUM 7.6* 7.8*   PT/INR Recent Labs    04/10/19 0456  LABPROT 15.3*  INR 1.2   ABG No results for input(s): PHART, HCO3 in the last 72 hours.  Invalid input(s): PCO2, PO2  Studies/Results: Ct Abdomen Pelvis W Contrast  Result Date: 04/09/2019 CLINICAL DATA:  Elevated white blood cell count and patient with abdominal pain for 1 week. EXAM: CT ABDOMEN AND PELVIS WITH CONTRAST TECHNIQUE: Multidetector CT imaging of the abdomen and pelvis was performed using the standard protocol following bolus  administration of intravenous contrast. CONTRAST:  100 mL OMNIPAQUE IOHEXOL 300 MG/ML  SOLN COMPARISON:  CT abdomen pelvis 04/04/2019. FINDINGS: Lower chest: Heart size is normal. No pleural or pericardial effusion. Minimal right basilar atelectasis noted. Hepatobiliary: The gallbladder is distended but otherwise unremarkable. The liver and biliary tree appear normal. Pancreas: Unremarkable. No pancreatic ductal dilatation or surrounding inflammatory changes. Spleen: Normal in size without focal abnormality. Adrenals/Urinary Tract: Small cyst lower pole right kidney incidentally noted. The kidneys otherwise appear normal. Ureters and urinary bladder are normal in appearance. Stomach/Bowel: Extensive inflammatory change about the sigmoid colon has markedly worsened. There is an air and fluid collection posterior and to the right of the uterus which measures approximately 6.5 cm transverse by 9 cm AP by 5 cm craniocaudal consistent with an abscess. A second air and fluid collection consistent with abscess more superiorly in the central pelvis measures 4 cm AP by 3.5 cm transverse by 7.5 cm craniocaudal. This collection is contains predominantly gas and it overlies a loop of small bowel which appears inflamed. Presacral edema is identified. The walls of the sigmoid colon are markedly thickened inflamed. The colon otherwise appears normal. The stomach, small bowel and appendix are normal in appearance. Vascular/Lymphatic: No significant vascular findings are present. No enlarged abdominal or pelvic lymph nodes. Reproductive: Uterus and bilateral adnexa are unremarkable. IUD noted. Other: None. Musculoskeletal: No acute or focal bony abnormality. Severe left hip osteoarthritis noted. IMPRESSION: Marked worsening findings in  the pelvis most consistent with acute sigmoid diverticulitis with 2 new abscesses identified as described above. These results were called by telephone at the time of interpretation on 04/09/2019 at  2:03 pm to [facility] Who verbally acknowledged these results. Electronically Signed   By: Drusilla Kannerhomas  Dalessio M.D.   On: 04/09/2019 14:07   Ct Image Guided Drainage By Percutaneous Catheter  Result Date: 04/10/2019 CLINICAL DATA:  Sigmoid diverticulitis with rupture and abscesses in the central upper pelvis and deep lower posterior pelvis. EXAM: 1. CT GUIDED CATHETER DRAINAGE OF PERITONEAL DIVERTICULAR ABSCESS 2. CT-GUIDED CATHETER DRAINAGE OF PELVIC PERITONEAL ABSCESS ANESTHESIA/SEDATION: 4.0 mg IV Versed 100 mcg IV Fentanyl Total Moderate Sedation Time:  52 minutes The patient's level of consciousness and physiologic status were continuously monitored during the procedure by Radiology nursing. PROCEDURE: The procedure, risks, benefits, and alternatives were explained to the patient. Questions regarding the procedure were encouraged and answered. The patient understands and consents to the procedure. A time out was performed prior to initiating the procedure. The anterior abdominal wall followed by the right transgluteal region were prepped with chlorhexidine in a sterile fashion, and a sterile drape was applied covering the operative field. A sterile gown and sterile gloves were used for the procedure. Local anesthesia was provided with 1% Lidocaine. An 18 gauge trocar needle was advanced from a right anterior approach to the level of a sigmoid diverticular abscess. After confirming needle tip position, aspiration was performed. A guidewire was advanced and the needle removed. The tract was dilated over the wire. A 10 French percutaneous drainage catheter was then advanced over the wire and formed. Catheter position was confirmed by CT. Additional fluid sample was aspirated from the drain. The drainage catheter was then flushed with saline and attached to a suction bulb. A Prolene retention suture was applied at the skin exit site. The patient was then placed in a prone position and from a right  transgluteal approach, an 18 gauge trocar needle was advanced to the level a second posterior lower pelvic abscess abscess. After confirming needle tip position, aspiration was performed. A guidewire was advanced and the needle removed. The tract was dilated over the wire. A 10 French percutaneous drainage catheter was then advanced over the wire and formed. Catheter position was confirmed by CT. Additional fluid sample was aspirated from the drain. The drainage catheter was then flushed with saline and attached to a suction bulb. A Prolene retention suture was applied at the skin exit site. COMPLICATIONS: None FINDINGS: There was an anterior approach available to the level of the central lower abdominal sigmoid diverticular abscess. Aspiration yielded feculent material. Additional aspiration via a drainage catheter yielded purulent and feculent fluid. A 10 French drain was formed in the abscess cavity. Persistent posterior pelvic abscess was able to be accessed from a right transgluteal approach. Aspiration yielded feculent and purulent fluid. A 10 French drain was formed in the abscess. IMPRESSION: CT-guided percutaneous drainage of 2 separate peritoneal abscess collections related to ruptured sigmoid diverticulitis. A 10 French drain was placed from an anterior approach into the sigmoid diverticular abscess. A second 10 French drain was placed from a posterior approach into the posterior deep pelvic abscess. Both drains were attached to suction bulbs. Electronically Signed   By: Irish LackGlenn  Yamagata M.D.   On: 04/10/2019 16:41   Ct Image Guided Drainage By Percutaneous Catheter  Result Date: 04/10/2019 CLINICAL DATA:  Sigmoid diverticulitis with rupture and abscesses in the central upper pelvis and deep lower  posterior pelvis. EXAM: 1. CT GUIDED CATHETER DRAINAGE OF PERITONEAL DIVERTICULAR ABSCESS 2. CT-GUIDED CATHETER DRAINAGE OF PELVIC PERITONEAL ABSCESS ANESTHESIA/SEDATION: 4.0 mg IV Versed 100 mcg IV Fentanyl  Total Moderate Sedation Time:  52 minutes The patient's level of consciousness and physiologic status were continuously monitored during the procedure by Radiology nursing. PROCEDURE: The procedure, risks, benefits, and alternatives were explained to the patient. Questions regarding the procedure were encouraged and answered. The patient understands and consents to the procedure. A time out was performed prior to initiating the procedure. The anterior abdominal wall followed by the right transgluteal region were prepped with chlorhexidine in a sterile fashion, and a sterile drape was applied covering the operative field. A sterile gown and sterile gloves were used for the procedure. Local anesthesia was provided with 1% Lidocaine. An 18 gauge trocar needle was advanced from a right anterior approach to the level of a sigmoid diverticular abscess. After confirming needle tip position, aspiration was performed. A guidewire was advanced and the needle removed. The tract was dilated over the wire. A 10 French percutaneous drainage catheter was then advanced over the wire and formed. Catheter position was confirmed by CT. Additional fluid sample was aspirated from the drain. The drainage catheter was then flushed with saline and attached to a suction bulb. A Prolene retention suture was applied at the skin exit site. The patient was then placed in a prone position and from a right transgluteal approach, an 18 gauge trocar needle was advanced to the level a second posterior lower pelvic abscess abscess. After confirming needle tip position, aspiration was performed. A guidewire was advanced and the needle removed. The tract was dilated over the wire. A 10 French percutaneous drainage catheter was then advanced over the wire and formed. Catheter position was confirmed by CT. Additional fluid sample was aspirated from the drain. The drainage catheter was then flushed with saline and attached to a suction bulb. A Prolene  retention suture was applied at the skin exit site. COMPLICATIONS: None FINDINGS: There was an anterior approach available to the level of the central lower abdominal sigmoid diverticular abscess. Aspiration yielded feculent material. Additional aspiration via a drainage catheter yielded purulent and feculent fluid. A 10 French drain was formed in the abscess cavity. Persistent posterior pelvic abscess was able to be accessed from a right transgluteal approach. Aspiration yielded feculent and purulent fluid. A 10 French drain was formed in the abscess. IMPRESSION: CT-guided percutaneous drainage of 2 separate peritoneal abscess collections related to ruptured sigmoid diverticulitis. A 10 French drain was placed from an anterior approach into the sigmoid diverticular abscess. A second 10 French drain was placed from a posterior approach into the posterior deep pelvic abscess. Both drains were attached to suction bulbs. Electronically Signed   By: Irish LackGlenn  Yamagata M.D.   On: 04/10/2019 16:41   [place] Ekg Site Rite  Result Date: 04/10/2019 If Site Rite image not attached, placement could not be confirmed due to current cardiac rhythm.   Anti-infectives: Anti-infectives (From admission, onward)   Start     Dose/Rate Route Frequency Ordered Stop   04/05/19 0600  piperacillin-tazobactam (ZOSYN) IVPB 3.375 g     3.375 g 12.5 mL/hr over 240 Minutes Intravenous Every 8 hours 04/05/19 0121     04/05/19 0000  piperacillin-tazobactam (ZOSYN) IVPB 3.375 g     3.375 g 100 mL/hr over 30 Minutes Intravenous  Once 04/04/19 2355 04/05/19 0119      Assessment/Plan:  GERD Osteoarthritis - takes meloxicam and  tramadol at baseline Recent right ankle surgery by Dr. Hewitt5/26/2020  Sigmoid diverticulitis with abscesses - no prior history of diverticulitis - never had a colonoscopy before - CT scan 6/3 showed acute sigmoid colitis with small foci of adjacent gasindicative ofmicro perforation, no abscess or  drainable fluid collection - CT 6/9 showed worsening sigmoid diverticulitis with 2 new abscesses -2 drains placed by IR yesterday successfully -Abdominal exam stable with localized LLQ tenderness -For now, continue medical management in hopes of resolving acute episode and avoiding colostomy -She is aware that if she does not resolve she may require Hartmann resection with temporary colostomy  ID -zosyn 6/4>> VTE -SCD,lovenox FEN -IVF,NPO Foley -none Follow up -TBD  Plan:IR to place drain today. Will also order PICC/TPN as it has been about 1 week since she has had a meal to eat. Continue IV zosyn.    LOS: 6 days    Molly MentionHaywood M Katerin Beck 04/11/2019

## 2019-04-11 NOTE — Progress Notes (Signed)
[facility] Progress Note     Subjective: CC-  Sore from drain x2 placement yesterday. Continues to have some crampy lower abdominal pain, but states that this is somewhat improved today. Denies n/v. She had a formed BM this morning.  Objective: Vital signs in last 24 hours: Temp:  [98.6 F (37 C)-99.3 F (37.4 C)] 98.9 F (37.2 C) (06/10 0512) Pulse Rate:  [105-115] 115 (06/10 0512) Resp:  [13-20] 16 (06/10 0512) BP: (116-152)/(77-97) 128/84 (06/10 0512) SpO2:  [94 %-100 %] 94 % (06/10 0512) Last BM Date: 04/05/19  Intake/Output from previous day: 06/09 0701 - 06/10 0700 In: 1653.4 [I.V.:1214; IV Piggyback:399.5] Out: 720 [Urine:650; Drains:70] Intake/Output this shift: Total I/O In: 0  Out: 815 [Urine:800; Drains:15]  PE: Gen: Alert, NAD, pleasant HEENT: EOM's intact, pupils equal and round Card: RRR Pulm: CTAB, no W/R/R, effort normal Abd: Soft,mild distension, +BS, no HSM,TTP LLQ and around anterior drain without peritonitis, anterior and TG drain with tan/feculent output NWG:NFAOZHExt:Splint to RLE. L calf soft and nontender Psych: A&Ox3  Skin: no rashes noted, warm and dry    Lab Results:  Recent Labs    04/10/19 0456 04/11/19 0531  WBC 10.4 9.2  HGB 9.3* 9.8*  HCT 28.9* 29.4*  PLT 300 318   BMET Recent Labs    04/10/19 0456 04/11/19 0531  NA 135 136  K 3.0* 3.0*  CL 109 108  CO2 14* 20*  GLUCOSE 66* 131*  BUN <5* 5*  CREATININE 0.51 0.45  CALCIUM 7.6* 7.8*   PT/INR Recent Labs    04/10/19 0456  LABPROT 15.3*  INR 1.2   CMP     Component Value Date/Time   NA 136 04/11/2019 0531   K 3.0 (L) 04/11/2019 0531   CL 108 04/11/2019 0531   CO2 20 (L) 04/11/2019 0531   GLUCOSE 131 (H) 04/11/2019 0531   BUN 5 (L) 04/11/2019 0531   CREATININE 0.45 04/11/2019 0531   CALCIUM 7.8 (L) 04/11/2019 0531   PROT 5.6 (L) 04/11/2019 0531   ALBUMIN 2.4 (L) 04/11/2019 0531   AST 18 04/11/2019 0531   ALT 20 04/11/2019 0531   ALKPHOS 72  04/11/2019 0531   BILITOT 0.6 04/11/2019 0531   GFRNONAA >60 04/11/2019 0531   GFRAA >60 04/11/2019 0531   Lipase     Component Value Date/Time   LIPASE 25 09/12/2015 1615       Studies/Results: Ct Abdomen Pelvis W Contrast  Result Date: 04/09/2019 CLINICAL DATA:  Elevated white blood cell count and patient with abdominal pain for 1 week. EXAM: CT ABDOMEN AND PELVIS WITH CONTRAST TECHNIQUE: Multidetector CT imaging of the abdomen and pelvis was performed using the standard protocol following bolus administration of intravenous contrast. CONTRAST:  100 mL OMNIPAQUE IOHEXOL 300 MG/ML  SOLN COMPARISON:  CT abdomen pelvis 04/04/2019. FINDINGS: Lower chest: Heart size is normal. No pleural or pericardial effusion. Minimal right basilar atelectasis noted. Hepatobiliary: The gallbladder is distended but otherwise unremarkable. The liver and biliary tree appear normal. Pancreas: Unremarkable. No pancreatic ductal dilatation or surrounding inflammatory changes. Spleen: Normal in size without focal abnormality. Adrenals/Urinary Tract: Small cyst lower pole right kidney incidentally noted. The kidneys otherwise appear normal. Ureters and urinary bladder are normal in appearance. Stomach/Bowel: Extensive inflammatory change about the sigmoid colon has markedly worsened. There is an air and fluid collection posterior and to the right of the uterus which measures approximately 6.5 cm transverse by 9 cm AP by 5 cm craniocaudal consistent with an abscess.  A second air and fluid collection consistent with abscess more superiorly in the central pelvis measures 4 cm AP by 3.5 cm transverse by 7.5 cm craniocaudal. This collection is contains predominantly gas and it overlies a loop of small bowel which appears inflamed. Presacral edema is identified. The walls of the sigmoid colon are markedly thickened inflamed. The colon otherwise appears normal. The stomach, small bowel and appendix are normal in appearance.  Vascular/Lymphatic: No significant vascular findings are present. No enlarged abdominal or pelvic lymph nodes. Reproductive: Uterus and bilateral adnexa are unremarkable. IUD noted. Other: None. Musculoskeletal: No acute or focal bony abnormality. Severe left hip osteoarthritis noted. IMPRESSION: Marked worsening findings in the pelvis most consistent with acute sigmoid diverticulitis with 2 new abscesses identified as described above. These results were called by telephone at the time of interpretation on 04/09/2019 at 2:03 pm to [facility], P.A., Who verbally acknowledged these results. Electronically Signed   By: Inge Rise M.D.   On: 04/09/2019 14:07   Ct Image Guided Drainage By Percutaneous Catheter  Result Date: 04/10/2019 CLINICAL DATA:  Sigmoid diverticulitis with rupture and abscesses in the central upper pelvis and deep lower posterior pelvis. EXAM: 1. CT GUIDED CATHETER DRAINAGE OF PERITONEAL DIVERTICULAR ABSCESS 2. CT-GUIDED CATHETER DRAINAGE OF PELVIC PERITONEAL ABSCESS ANESTHESIA/SEDATION: 4.0 mg IV Versed 100 mcg IV Fentanyl Total Moderate Sedation Time:  52 minutes The patient's level of consciousness and physiologic status were continuously monitored during the procedure by Radiology nursing. PROCEDURE: The procedure, risks, benefits, and alternatives were explained to the patient. Questions regarding the procedure were encouraged and answered. The patient understands and consents to the procedure. A time out was performed prior to initiating the procedure. The anterior abdominal wall followed by the right transgluteal region were prepped with chlorhexidine in a sterile fashion, and a sterile drape was applied covering the operative field. A sterile gown and sterile gloves were used for the procedure. Local anesthesia was provided with 1% Lidocaine. An 18 gauge trocar needle was advanced from a right anterior approach to the level of a sigmoid diverticular abscess. After confirming  needle tip position, aspiration was performed. A guidewire was advanced and the needle removed. The tract was dilated over the wire. A 10 French percutaneous drainage catheter was then advanced over the wire and formed. Catheter position was confirmed by CT. Additional fluid sample was aspirated from the drain. The drainage catheter was then flushed with saline and attached to a suction bulb. A Prolene retention suture was applied at the skin exit site. The patient was then placed in a prone position and from a right transgluteal approach, an 18 gauge trocar needle was advanced to the level a second posterior lower pelvic abscess abscess. After confirming needle tip position, aspiration was performed. A guidewire was advanced and the needle removed. The tract was dilated over the wire. A 10 French percutaneous drainage catheter was then advanced over the wire and formed. Catheter position was confirmed by CT. Additional fluid sample was aspirated from the drain. The drainage catheter was then flushed with saline and attached to a suction bulb. A Prolene retention suture was applied at the skin exit site. COMPLICATIONS: None FINDINGS: There was an anterior approach available to the level of the central lower abdominal sigmoid diverticular abscess. Aspiration yielded feculent material. Additional aspiration via a drainage catheter yielded purulent and feculent fluid. A 10 French drain was formed in the abscess cavity. Persistent posterior pelvic abscess was able to be accessed from a right  transgluteal approach. Aspiration yielded feculent and purulent fluid. A 10 French drain was formed in the abscess. IMPRESSION: CT-guided percutaneous drainage of 2 separate peritoneal abscess collections related to ruptured sigmoid diverticulitis. A 10 French drain was placed from an anterior approach into the sigmoid diverticular abscess. A second 10 French drain was placed from a posterior approach into the posterior deep pelvic  abscess. Both drains were attached to suction bulbs. Electronically Signed   By: Irish LackGlenn  Yamagata M.D.   On: 04/10/2019 16:41   Ct Image Guided Drainage By Percutaneous Catheter  Result Date: 04/10/2019 CLINICAL DATA:  Sigmoid diverticulitis with rupture and abscesses in the central upper pelvis and deep lower posterior pelvis. EXAM: 1. CT GUIDED CATHETER DRAINAGE OF PERITONEAL DIVERTICULAR ABSCESS 2. CT-GUIDED CATHETER DRAINAGE OF PELVIC PERITONEAL ABSCESS ANESTHESIA/SEDATION: 4.0 mg IV Versed 100 mcg IV Fentanyl Total Moderate Sedation Time:  52 minutes The patient's level of consciousness and physiologic status were continuously monitored during the procedure by Radiology nursing. PROCEDURE: The procedure, risks, benefits, and alternatives were explained to the patient. Questions regarding the procedure were encouraged and answered. The patient understands and consents to the procedure. A time out was performed prior to initiating the procedure. The anterior abdominal wall followed by the right transgluteal region were prepped with chlorhexidine in a sterile fashion, and a sterile drape was applied covering the operative field. A sterile gown and sterile gloves were used for the procedure. Local anesthesia was provided with 1% Lidocaine. An 18 gauge trocar needle was advanced from a right anterior approach to the level of a sigmoid diverticular abscess. After confirming needle tip position, aspiration was performed. A guidewire was advanced and the needle removed. The tract was dilated over the wire. A 10 French percutaneous drainage catheter was then advanced over the wire and formed. Catheter position was confirmed by CT. Additional fluid sample was aspirated from the drain. The drainage catheter was then flushed with saline and attached to a suction bulb. A Prolene retention suture was applied at the skin exit site. The patient was then placed in a prone position and from a right transgluteal approach, an 18  gauge trocar needle was advanced to the level a second posterior lower pelvic abscess abscess. After confirming needle tip position, aspiration was performed. A guidewire was advanced and the needle removed. The tract was dilated over the wire. A 10 French percutaneous drainage catheter was then advanced over the wire and formed. Catheter position was confirmed by CT. Additional fluid sample was aspirated from the drain. The drainage catheter was then flushed with saline and attached to a suction bulb. A Prolene retention suture was applied at the skin exit site. COMPLICATIONS: None FINDINGS: There was an anterior approach available to the level of the central lower abdominal sigmoid diverticular abscess. Aspiration yielded feculent material. Additional aspiration via a drainage catheter yielded purulent and feculent fluid. A 10 French drain was formed in the abscess cavity. Persistent posterior pelvic abscess was able to be accessed from a right transgluteal approach. Aspiration yielded feculent and purulent fluid. A 10 French drain was formed in the abscess. IMPRESSION: CT-guided percutaneous drainage of 2 separate peritoneal abscess collections related to ruptured sigmoid diverticulitis. A 10 French drain was placed from an anterior approach into the sigmoid diverticular abscess. A second 10 French drain was placed from a posterior approach into the posterior deep pelvic abscess. Both drains were attached to suction bulbs. Electronically Signed   By: Irish LackGlenn  Yamagata M.D.   On: 04/10/2019 16:41  [place] Ekg Site Rite  Result Date: 04/10/2019 If Site Rite image not attached, placement could not be confirmed due to current cardiac rhythm.   Anti-infectives: Anti-infectives (From admission, onward)   Start     Dose/Rate Route Frequency Ordered Stop   04/05/19 0600  piperacillin-tazobactam (ZOSYN) IVPB 3.375 g     3.375 g 12.5 mL/hr over 240 Minutes Intravenous Every 8 hours 04/05/19 0121     04/05/19 0000   piperacillin-tazobactam (ZOSYN) IVPB 3.375 g     3.375 g 100 mL/hr over 30 Minutes Intravenous  Once 04/04/19 2355 04/05/19 0119       Assessment/Plan GERD Osteoarthritis - takes meloxicam and tramadol at baseline Recent right ankle surgery by Dr. Hewitt5/26/2020 Malnutrition - prealbumin <5, on TPN  Sigmoid diverticulitis with abscesses - no prior history of diverticulitis - never had a colonoscopy before - CT scan 6/3 showed acute sigmoid colitis with small foci of adjacent gasindicative ofmicro perforation, no abscess or drainable fluid collection - CT 6/9 showed worsening sigmoid diverticulitis with 2 new abscesses - s/p IR drain x2 placement 6/9, culture pending  ID -zosyn 6/4>> VTE -SCD,lovenox FEN -IVF,NPO, TPN Foley -none Follow up -TBD  Plan:Continue bowel rest and IV zosyn. WBC WNL and pain improving, may be able to start clears tomorrow. Continue drains and follow cultures.    LOS: 6 days    Franne FortsBrooke A  , [facility] 04/11/2019, 11:22 AM Pager: [phone] Mon-Thurs 7:00 am-4:30 pm Fri 7:00 am -11:30 AM Sat-Sun 7:00 am-11:30 am

## 2019-04-11 NOTE — Progress Notes (Addendum)
PHARMACY - ADULT TOTAL PARENTERAL NUTRITION CONSULT NOTE   Pharmacy Consult for TPN Indication: intolerance to enteral feedings, diverticulitis  Patient Measurements: Height: 5\' 4"  (162.6 cm) Weight: 173 lb 15.1 oz (78.9 kg) IBW/kg (Calculated) : 54.7 TPN AdjBW (KG): 61 Body mass index is 29.86 kg/m.  Insulin Requirements: 0 units SSI last 24hr  Current Nutrition: now NPO  IVF: NS with 59mEq KCL at 35 ml/hr  Central access:  PICC to be placed 6/9 TPN start date: 6/9  ASSESSMENT                                                                                                          HPI: 53 yo female with sigmoid diverticulitis with abscesses, now worsening to have IR place drain today. Patient has been intolerant to enteral feedings for about a week now so plan to place PICC line and start TPN per [facility] orders today  Significant events:   Today, 04/11/19  Glucose - CBGs after TPN started - 109, 119   Electrolytes - stable except K low at 3 and phos low at 2.1 - KCL IV x 6 runs already ordered by Md  Renal - SCr 0.45 stable  LFTs - WNL  TGs - 91 (6/10)  Prealbumin - pending (6/10)  NUTRITIONAL GOALS                                                                                             RD recs: Kcal/day: 2000-2240, protein/day: 95-105g  Custom TPN to provide 99g protein per day and 1915 kcal/day at goal rate of 75 ml/hr (96% of estimated needs per RD)  PLAN                                                                                                                         Now:   NaPhos 49mMol x 1 after 6 runs IV KCL 32mEq completed  At 1800 today:  Continue Custom TPN and increase rate from 40 ml/hr to 60 ml/hr  TPN will contain standard electrolytes except: increase Na to 75, K to 80, and max acetate  Plan to advance as tolerated to the goal rate.  TPN to contain standard multivitamins daily and trace elements on MWF due to national shortage  Red[facility]  [facility]  Add CBG/sensitive scale SSI q6h  TPN lab panels on Mondays & Thursdays.  F/u daily.    Hessie KnowsJustin M Donielle Kaigler, PharmD, BCPS 04/11/2019 9:48 AM

## 2019-04-11 NOTE — Progress Notes (Signed)
PROGRESS NOTE    Molly Beck  ZOX:[id number] DOB: 05/26/1966 DOA: 04/04/2019 PCP: Clementeen GrahamScifres, [name], [name]     Brief Narrative:   Molly Beck is a 53 y.o. female with medical history significant of GERD, elective surgery of her right foot a week ago presenting to the hospital for evaluation of abdominal pain.  Patient states she had a reconstructive right foot surgery done a week ago.  States since then she has been constipated due to taking pain medications.  Her last bowel movement was prior to the surgery.  For the past few days she is having left lower quadrant abdominal pain which became worse for the past 24 hours.  The pain is sharp and excruciating.  States today she called her surgeon's office and was advised to take magnesium citrate for constipation.  She vomited soon after taking magnesium citrate.  Denies any fevers.  She continues to complain of abdominal pain despite receiving a dose of morphine and multiple doses of fentanyl in the ED. CT abdomen pelvis showing acute sigmoid colitis with small foci of adjacent gas that may be extra luminal and may indicate microperforation.  No abscess or drainable fluid collection. She was admitted for further treatment and care.  General surgery was consulted.  New events last 24 hours / Subjective: Continues to complain of drain site pain.  Reports a bowel movement.  Continues n.p.o. per general surgery.  Overall feels better today.  No other complaints this morning.  White blood cell count down to 9.2, and afebrile.  Denies headache, no fever/chills/night sweats, no chest pain, no palpitations, no shortness of breath, no cough/congestion, no paresthesias.  No acute events overnight per nursing staff.  Assessment & Plan:   Principal Problem:   Perforated sigmoid colon  Active Problems:   Colitis   Sepsis (HCC)   Hypokalemia   H/O foot surgery   Sepsis secondary to acute sigmoid colitis with microperforation Sepsis, present on  admission Patient presenting to ED with progressive abdominal pain.  CT abdomen/pelvis notable for acute sigmoid colitis with adjacent gas likely secondary to microperforation.  Patient was initially started on IV Zosyn.  During her initial hospitalization, her abdominal pain continued not to improve with resultant fevers and elevated white count.  Repeat CT abdomen/pelvis reveals marked worsening in the pelvis with 2 abscesses.  Interventional radiology was consulted and placed two drains on 04/10/2019.  --General surgery/IR following, appreciate assistance --Output past 24h from drains 30 mL's and 40 mL's respectively. --Blood culture 04/05/2019 no growth x5 days --Wound culture 04/10/2019: Abundant GNR's, moderate GPC's, moderate gram variable rods, awaiting further identification and susceptibilities --Continue Zosyn --Continue n.p.o., PICC line placed, started on TPN per general surgery --Continue to monitor drain output and BMs --Possible transition to clear liquid diet tomorrow per general surgery  Rash Maculopapular rash bilateral distribution on her back. Could be due to heat/sweat rash or contact from bedsheets.  She has tolerated IV Zosyn for the past week without any signs of allergic reaction.  This does not appear to be an reaction to Zosyn at this time.   --continue IV Benadryl and hydrocortisone cream prn.  --continue hygiene and change of bed sheets daily  Recent right foot surgery Currently in a cast, plans to follow-up with her orthopedic surgeon as an outpatient --PT evaluation --Continue nonweightbearing right lower extremity  Hypokalemia Potassium 3.0 today.  Magnesium 1.7. --Replete potassium and magnesium today --Repeat electrolytes in a.m. to include magnesium   DVT prophylaxis: Lovenox Code  Status: Full Family Communication: None Disposition Plan: Continue inpatient hospitalization, n.p.o., on TPN, IV antibiotics, further dependent on clinical  course.   Consultants:   General surgery  IR  Procedures:   IR drain placement 04/10/2019  Antimicrobials:  Anti-infectives (From admission, onward)   Start     Dose/Rate Route Frequency Ordered Stop   04/05/19 0600  piperacillin-tazobactam (ZOSYN) IVPB 3.375 g     3.375 g 12.5 mL/hr over 240 Minutes Intravenous Every 8 hours 04/05/19 0121     04/05/19 0000  piperacillin-tazobactam (ZOSYN) IVPB 3.375 g     3.375 g 100 mL/hr over 30 Minutes Intravenous  Once 04/04/19 2355 04/05/19 0119       Objective: Vitals:   04/10/19 2207 04/11/19 0144 04/11/19 0512 04/11/19 1243  BP: 137/84 124/84 128/84 133/87  Pulse: (!) 108 (!) 115 (!) 115 (!) 113  Resp: Temp: 98.7 F (37.1 C) 99.3 F (37.4 C) 98.9 F (37.2 C) 98.3 F (36.8 C)  TempSrc: Oral Oral Oral Oral  SpO2: 96% 95% 94% 97%  Weight:      Height:        Intake/Output Summary (Last 24 hours) at 04/11/2019 1439 Last data filed at 04/11/2019 1348 Gross per 24 hour  Intake 1759.82 ml  Output 1735 ml  Net 24.82 ml   Filed Weights   04/05/19 0953 04/10/19 1059  Weight: 79.9 kg 78.9 kg    Examination: General exam: Appears calm and comfortable  Respiratory system: Clear to auscultation. Respiratory effort normal. Cardiovascular system: S1 & S2 heard, RRR. No JVD, murmurs, rubs, gallops or clicks. No pedal edema. Gastrointestinal system: Abdomen is nondistended, soft and tender to palpation left lower quadrant and pelvic, drains noted Central nervous system: Alert and oriented. No focal neurological deficits. Extremities: Symmetric 5 x 5 power. Skin: + Maculopapular rash overlying her back Psychiatry: Judgement and insight appear normal. Mood & affect appropriate.    Data Reviewed: I have personally reviewed following labs and imaging studies  CBC: Recent Labs  Lab 04/04/19 2100 04/05/19 0608  04/07/19 0600 04/08/19 0511 04/09/19 0500 04/10/19 0456 04/11/19 0531  WBC 14.0* 11.7*   < > 12.0*  13.0* 13.4* 10.4 9.2  NEUTROABS 12.3* 10.0*  --   --   --   --   --  6.7  HGB 13.7 11.9*   < > 9.7* 10.2* 9.8* 9.3* 9.8*  HCT 40.7 36.5   < > 29.8* 31.7* 30.0* 28.9* 29.4*  MCV 91.3 94.3   < > 95.2 94.6 95.2 93.5 90.7  PLT 380 319   < > 245 295 287 300 318   < > = values in this interval not displayed.   Basic Metabolic Panel: Recent Labs  Lab 04/04/19 2100 04/05/19 0608  04/07/19 0600 04/08/19 0511 04/09/19 0500 04/10/19 0456 04/11/19 0531  NA 140 141   < > 138 140 137 135 136  K 3.2* 4.1   < > 2.9* 3.3* 3.4* 3.0* 3.0*  CL 103 110   < > 109 107 109 109 108  CO2 24 22   < > 19* 23 16* 14* 20*  GLUCOSE 149* 133*   < > 84 87 72 66* 131*  BUN 17 15   < > <5* 5*  CREATININE 0.67 0.68   < > 0.53 0.51 0.52 0.51 0.45  CALCIUM 9.1 8.1*   < > 7.5* 8.0* 7.7* 7.6* 7.8*  MG 2.7* 2.3  --  1.9  --   --   --  1.7  PHOS  --   --   --   --   --   --   --  2.1*   < > = values in this interval not displayed.   GFR: Estimated Creatinine Clearance: 83.6 mL/min (by C-G formula based on SCr of 0.45 mg/dL). Liver Function Tests: Recent Labs  Lab 04/04/19 2100 04/11/19 0531  AST 20 18  ALT 18 20  ALKPHOS 101 72  BILITOT 0.8 0.6  PROT 7.7 5.6*  ALBUMIN 4.2 2.4*   No results for input(s): LIPASE, AMYLASE in the last 168 hours. No results for input(s): AMMONIA in the last 168 hours. Coagulation Profile: Recent Labs  Lab 04/10/19 0456  INR 1.2   Cardiac Enzymes: No results for input(s): CKTOTAL, CKMB, CKMBINDEX, TROPONINI in the last 168 hours. BNP (last 3 results) No results for input(s): PROBNP in the last 8760 hours. HbA1C: No results for input(s): HGBA1C in the last 72 hours. CBG: Recent Labs  Lab 04/10/19 1701 04/10/19 1726 04/11/19 0003 04/11/19 0553 04/11/19 1151  GLUCAP 52* 110* 109* 119* 120*   Lipid Profile: Recent Labs    04/11/19 0531  TRIG 91   Thyroid Function Tests: No results for input(s): TSH, T4TOTAL, FREET4, T3FREE, THYROIDAB in the last 72  hours. Anemia Panel: No results for input(s): VITAMINB12, FOLATE, FERRITIN, TIBC, IRON, RETICCTPCT in the last 72 hours. Sepsis Labs: Recent Labs  Lab 04/05/19 0253  LATICACIDVEN 1.2    Recent Results (from the past 240 hour(s))  SARS Coronavirus 2 (CEPHEID - Performed in [facility] lab), Hosp Order     Status: None   Collection Time: 04/05/19 12:14 AM  Result Value Ref Range Status   SARS Coronavirus 2 NEGATIVE NEGATIVE Final    Comment: (NOTE) If result is NEGATIVE SARS-CoV-2 target nucleic acids are NOT DETECTED. The SARS-CoV-2 RNA is generally detectable in upper and lower  respiratory specimens during the acute phase of infection. The lowest  concentration of SARS-CoV-2 viral copies this assay can detect is 250  copies / mL. A negative result does not preclude SARS-CoV-2 infection  and should not be used as the sole basis for treatment or other  patient management decisions.  A negative result may occur with  improper specimen collection / handling, submission of specimen other  than nasopharyngeal swab, presence of viral mutation(s) within the  areas targeted by this assay, and inadequate number of viral copies  (<250 copies / mL). A negative result must be combined with clinical  observations, patient history, and epidemiological information. If result is POSITIVE SARS-CoV-2 target nucleic acids are DETECTED. The SARS-CoV-2 RNA is generally detectable in upper and lower  respiratory specimens dur ing the acute phase of infection.  Positive  results are indicative of active infection with SARS-CoV-2.  Clinical  correlation with patient history and other diagnostic information is  necessary to determine patient infection status.  Positive results do  not rule out bacterial infection or co-infection with other viruses. If result is PRESUMPTIVE POSTIVE SARS-CoV-2 nucleic acids MAY BE PRESENT.   A presumptive positive result was obtained on the submitted specimen   and confirmed on repeat testing.  While 2019 novel coronavirus  (SARS-CoV-2) nucleic acids may be present in the submitted sample  additional confirmatory testing may be necessary for epidemiological  and / or clinical management purposes  to differentiate between  SARS-CoV-2 and other Sarbecovirus currently known to infect humans.  If clinically indicated additional testing with an alternate test  methodology (  [id number]) is advised. The SARS-CoV-2 RNA is generally  detectable in upper and lower respiratory sp ecimens during the acute  phase of infection. The expected result is Negative. Fact Sheet for Patients:  StrictlyIdeas.no Fact Sheet for Healthcare Providers: BankingDealers.co.za This test is not yet approved or cleared by the [place] FDA and has been authorized for detection and/or diagnosis of SARS-CoV-2 by FDA under an Emergency Use Authorization (EUA).  This EUA will remain in effect (meaning this test can be used) for the duration of the COVID-19 declaration under Section 564(b)(1) of the Act, 21 U.S.C. section 360bbb-3(b)(1), unless the authorization is terminated or revoked sooner. Performed at [facility], [place] [address], [place], [place] [address]   Culture, blood (routine x 2)     Status: None   Collection Time: 04/05/19  1:10 AM  Result Value Ref Range Status   Specimen Description   Final    BLOOD RIGHT HAND Performed at [facility], [place] [address], [place], [place] [address]    Special Requests   Final    BOTTLES DRAWN AEROBIC AND ANAEROBIC Blood Culture results may not be optimal due to an excessive volume of blood received in culture bottles Performed at [place] [address] [place], [place] [address]    Culture   Final    NO GROWTH 5 DAYS Performed at [facility], [place] [address], [place], [place] [address]    Report Status 04/10/2019 FINAL   Final  Culture, blood (routine x 2)     Status: None   Collection Time: 04/05/19  2:53 AM  Result Value Ref Range Status   Specimen Description   Final    BLOOD RIGHT ANTECUBITAL Performed at [place] [address], [place], [place] [address]    Special Requests   Final    BOTTLES DRAWN AEROBIC AND ANAEROBIC Blood Culture results may not be optimal due to an excessive volume of blood received in culture bottles Performed at [place] [address] [place], [place] [address]    Culture   Final    NO GROWTH 5 DAYS Performed at [facility], [place] [address], [place], [place] [address]    Report Status 04/10/2019 FINAL  Final  Aerobic/Anaerobic Culture (surgical/deep wound)     Status: None (Preliminary result)   Collection Time: 04/10/19  2:05 PM  Result Value Ref Range Status   Specimen Description   Final    ABSCESS DIVERTICULAR Performed at [place] [address], [place], [place] [address]    Special Requests   Final    Normal Performed at [facility], [place] [address], [place], [place] [address]    Gram Stain   Final    ABUNDANT WBC PRESENT, PREDOMINANTLY PMN ABUNDANT GRAM NEGATIVE RODS ABUNDANT GRAM POSITIVE COCCI FEW GRAM VARIABLE ROD    Culture   Final    CULTURE REINCUBATED FOR BETTER GROWTH Performed at [facility], [place] [address], [place], [place] [address]    Report Status PENDING  Incomplete  Aerobic/Anaerobic Culture (surgical/deep wound)     Status: None (Preliminary result)   Collection Time: 04/10/19  2:06 PM  Result Value Ref Range Status   Specimen Description   Final    ABSCESS PELVIC Performed at [place] [address], [place],  [address]    Special Requests   Final    Normal  Performed at [facility], [address] [place], [place] [address]    Gram Stain   Final    ABUNDANT WBC PRESENT,BOTH  PMN AND MONONUCLEAR ABUNDANT GRAM NEGATIVE RODS MODERATE GRAM POSITIVE COCCI MODERATE GRAM VARIABLE ROD    Culture   Final    CULTURE REINCUBATED FOR BETTER GROWTH Performed at [facility], [address], [place], [place] [address]    Report Status PENDING  Incomplete       Radiology Studies: Ct Image Guided Drainage By Percutaneous Catheter  Result Date: 04/10/2019 CLINICAL DATA:  Sigmoid diverticulitis with rupture and abscesses in the central upper pelvis and deep lower posterior pelvis. EXAM: 1. CT GUIDED CATHETER DRAINAGE OF PERITONEAL DIVERTICULAR ABSCESS 2. CT-GUIDED CATHETER DRAINAGE OF PELVIC PERITONEAL ABSCESS ANESTHESIA/SEDATION: 4.0 mg IV Versed 100 mcg IV Fentanyl Total Moderate Sedation Time:  52 minutes The patient's level of consciousness and physiologic status were continuously monitored during the procedure by Radiology nursing. PROCEDURE: The procedure, risks, benefits, and alternatives were explained to the patient. Questions regarding the procedure were encouraged and answered. The patient understands and consents to the procedure. A time out was performed prior to initiating the procedure. The anterior abdominal wall followed by the right transgluteal region were prepped with chlorhexidine in a sterile fashion, and a sterile drape was applied covering the operative field. A sterile gown and sterile gloves were used for the procedure. Local anesthesia was provided with 1% Lidocaine. An 18 gauge trocar needle was advanced from a right anterior approach to the level of a sigmoid diverticular abscess. After confirming needle tip position, aspiration was performed. A guidewire was advanced and the needle removed. The tract was dilated over the wire. A 10 French percutaneous drainage catheter was then advanced over the wire and formed. Catheter position was confirmed by CT. Additional fluid sample was aspirated from the drain. The drainage catheter was then flushed with saline  and attached to a suction bulb. A Prolene retention suture was applied at the skin exit site. The patient was then placed in a prone position and from a right transgluteal approach, an 18 gauge trocar needle was advanced to the level a second posterior lower pelvic abscess abscess. After confirming needle tip position, aspiration was performed. A guidewire was advanced and the needle removed. The tract was dilated over the wire. A 10 French percutaneous drainage catheter was then advanced over the wire and formed. Catheter position was confirmed by CT. Additional fluid sample was aspirated from the drain. The drainage catheter was then flushed with saline and attached to a suction bulb. A Prolene retention suture was applied at the skin exit site. COMPLICATIONS: None FINDINGS: There was an anterior approach available to the level of the central lower abdominal sigmoid diverticular abscess. Aspiration yielded feculent material. Additional aspiration via a drainage catheter yielded purulent and feculent fluid. A 10 French drain was formed in the abscess cavity. Persistent posterior pelvic abscess was able to be accessed from a right transgluteal approach. Aspiration yielded feculent and purulent fluid. A 10 French drain was formed in the abscess. IMPRESSION: CT-guided percutaneous drainage of 2 separate peritoneal abscess collections related to ruptured sigmoid diverticulitis. A 10 French drain was placed from an anterior approach into the sigmoid diverticular abscess. A second 10 French drain was placed from a posterior approach into the posterior deep pelvic abscess. Both drains were attached to suction bulbs. Electronically Signed   By: Irish Lack M.D.   On: 04/10/2019 16:41  Ct Image Guided Drainage By Percutaneous Catheter  Result Date: 04/10/2019 CLINICAL DATA:  Sigmoid diverticulitis with rupture and abscesses in the central upper pelvis and deep lower posterior pelvis. EXAM: 1. CT GUIDED CATHETER  DRAINAGE OF PERITONEAL DIVERTICULAR ABSCESS 2. CT-GUIDED CATHETER DRAINAGE OF PELVIC PERITONEAL ABSCESS ANESTHESIA/SEDATION: 4.0 mg IV Versed 100 mcg IV Fentanyl Total Moderate Sedation Time:  52 minutes The patient's level of consciousness and physiologic status were continuously monitored during the procedure by Radiology nursing. PROCEDURE: The procedure, risks, benefits, and alternatives were explained to the patient. Questions regarding the procedure were encouraged and answered. The patient understands and consents to the procedure. A time out was performed prior to initiating the procedure. The anterior abdominal wall followed by the right transgluteal region were prepped with chlorhexidine in a sterile fashion, and a sterile drape was applied covering the operative field. A sterile gown and sterile gloves were used for the procedure. Local anesthesia was provided with 1% Lidocaine. An 18 gauge trocar needle was advanced from a right anterior approach to the level of a sigmoid diverticular abscess. After confirming needle tip position, aspiration was performed. A guidewire was advanced and the needle removed. The tract was dilated over the wire. A 10 French percutaneous drainage catheter was then advanced over the wire and formed. Catheter position was confirmed by CT. Additional fluid sample was aspirated from the drain. The drainage catheter was then flushed with saline and attached to a suction bulb. A Prolene retention suture was applied at the skin exit site. The patient was then placed in a prone position and from a right transgluteal approach, an 18 gauge trocar needle was advanced to the level a second posterior lower pelvic abscess abscess. After confirming needle tip position, aspiration was performed. A guidewire was advanced and the needle removed. The tract was dilated over the wire. A 10 French percutaneous drainage catheter was then advanced over the wire and formed. Catheter position was  confirmed by CT. Additional fluid sample was aspirated from the drain. The drainage catheter was then flushed with saline and attached to a suction bulb. A Prolene retention suture was applied at the skin exit site. COMPLICATIONS: None FINDINGS: There was an anterior approach available to the level of the central lower abdominal sigmoid diverticular abscess. Aspiration yielded feculent material. Additional aspiration via a drainage catheter yielded purulent and feculent fluid. A 10 French drain was formed in the abscess cavity. Persistent posterior pelvic abscess was able to be accessed from a right transgluteal approach. Aspiration yielded feculent and purulent fluid. A 10 French drain was formed in the abscess. IMPRESSION: CT-guided percutaneous drainage of 2 separate peritoneal abscess collections related to ruptured sigmoid diverticulitis. A 10 French drain was placed from an anterior approach into the sigmoid diverticular abscess. A second 10 French drain was placed from a posterior approach into the posterior deep pelvic abscess. Both drains were attached to suction bulbs. Electronically Signed   By: Irish LackGlenn  Yamagata M.D.   On: 04/10/2019 16:41   [place] Ekg Site Rite  Result Date: 04/10/2019 If Site Rite image not attached, placement could not be confirmed due to current cardiac rhythm.     Scheduled Meds:  enoxaparin (LOVENOX) injection  40 mg Subcutaneous Q24H   fluticasone  1 spray Each Nare Daily   insulin aspart  0-9 Units Subcutaneous Q6H   pantoprazole  20 mg Oral BID   sodium chloride flush  10 mL Intracatheter Q8H   sodium chloride flush  10-40 mL Intracatheter Q12H  Continuous Infusions:  sodium chloride Stopped (04/05/19 1756)   sodium chloride     0.9 % NaCl with KCl 40 mEq / L 35 mL/hr (04/10/19 1745)   methocarbamol (ROBAXIN) IV     piperacillin-tazobactam (ZOSYN)  IV 3.375 g (04/11/19 1348)   potassium chloride 10 mEq (04/11/19 1347)   sodium phosphate  Dextrose  5% IVPB     TPN ADULT (ION) 40 mL/hr at 04/10/19 1800   TPN ADULT (ION)       LOS: 6 days    Time spent: 28 minutes   Alvira Philips [place], DO [facility] [email or web address] 04/11/2019, 2:39 PM

## 2019-04-12 LAB — PHOSPHORUS: Phosphorus: 3 mg/dL (ref 2.5–4.6)

## 2019-04-12 LAB — COMPREHENSIVE METABOLIC PANEL
ALT: 20 U/L (ref 0–44)
AST: 19 U/L (ref 15–41)
Albumin: 2.4 g/dL — ABNORMAL LOW (ref 3.5–5.0)
Alkaline Phosphatase: 74 U/L (ref 38–126)
Anion gap: 10 (ref 5–15)
BUN: 7 mg/dL (ref 6–20)
CO2: 26 mmol/L (ref 22–32)
Calcium: 7.5 mg/dL — ABNORMAL LOW (ref 8.9–10.3)
Chloride: 100 mmol/L (ref 98–111)
Creatinine, Ser: 0.31 mg/dL — ABNORMAL LOW (ref 0.44–1.00)
GFR calc Af Amer: >60 mL/min (ref >60)
GFR calc non Af Amer: >60 mL/min (ref >60)
Glucose, Bld: 116 mg/dL — ABNORMAL HIGH (ref 70–99)
Potassium: 3.2 mmol/L — ABNORMAL LOW (ref 3.5–5.1)
Sodium: 136 mmol/L (ref 135–145)
Total Bilirubin: 0.4 mg/dL (ref 0.3–1.2)
Total Protein: 5.7 g/dL — ABNORMAL LOW (ref 6.5–8.1)

## 2019-04-12 LAB — CBC
HCT: 27.7 % — ABNORMAL LOW (ref 36.0–46.0)
Hemoglobin: 9.4 g/dL — ABNORMAL LOW (ref 12.0–15.0)
MCH: 30.7 pg (ref 26.0–34.0)
MCHC: 33.9 g/dL (ref 30.0–36.0)
MCV: 90.5 fL (ref 80.0–100.0)
Platelets: 344 10*3/uL (ref 150–400)
RBC: 3.06 MIL/uL — ABNORMAL LOW (ref 3.87–5.11)
RDW: 13.7 % (ref 11.5–15.5)
WBC: 9 10*3/uL (ref 4.0–10.5)
nRBC: 0 % (ref 0.0–0.2)

## 2019-04-12 LAB — GLUCOSE, CAPILLARY
Glucose-Capillary: 110 mg/dL — ABNORMAL HIGH (ref 70–99)
Glucose-Capillary: 124 mg/dL — ABNORMAL HIGH (ref 70–99)
Glucose-Capillary: 131 mg/dL — ABNORMAL HIGH (ref 70–99)
Glucose-Capillary: 96 mg/dL (ref 70–99)

## 2019-04-12 LAB — MAGNESIUM: Magnesium: 2.1 mg/dL (ref 1.7–2.4)

## 2019-04-12 MED ORDER — POTASSIUM CHLORIDE 10 MEQ/100ML IV SOLN
10.0000 meq | INTRAVENOUS | Status: AC
  Administered 2019-04-12 (×6): 10 meq via INTRAVENOUS
  Filled 2019-04-12 (×6): qty 100

## 2019-04-12 MED ORDER — TRAVASOL 10 % IV SOLN
INTRAVENOUS | Status: AC
  Administered 2019-04-12: 19:00:00 via INTRAVENOUS
  Filled 2019-04-12: qty 990

## 2019-04-12 MED ORDER — BOOST / RESOURCE BREEZE PO LIQD CUSTOM
1.0000 | Freq: Two times a day (BID) | ORAL | Status: DC
  Administered 2019-04-12 – 2019-04-13 (×4): 1 via ORAL

## 2019-04-12 MED ORDER — TRAVASOL 10 % IV SOLN
INTRAVENOUS | Status: DC
  Filled 2019-04-12: qty 990

## 2019-04-12 MED ORDER — INSULIN ASPART 100 UNIT/ML ~~LOC~~ SOLN
0.0000 [IU] | Freq: Three times a day (TID) | SUBCUTANEOUS | Status: DC
  Administered 2019-04-14: 1 [IU] via SUBCUTANEOUS

## 2019-04-12 NOTE — Progress Notes (Signed)
PROGRESS NOTE    Molly Beck  ZOX:[id number] DOB: 06/10/1966 DOA: 04/04/2019 PCP: Clementeen GrahamScifres, [name], [name]     Brief Narrative:   Molly Beck is a 53 y.o. female with medical history significant of GERD, elective surgery of her right foot a week ago presenting to the hospital for evaluation of abdominal pain.  Patient states she had a reconstructive right foot surgery done a week ago.  States since then she has been constipated due to taking pain medications.  Her last bowel movement was prior to the surgery.  For the past few days she is having left lower quadrant abdominal pain which became worse for the past 24 hours.  The pain is sharp and excruciating.  States today she called her surgeon's office and was advised to take magnesium citrate for constipation.  She vomited soon after taking magnesium citrate.  Denies any fevers.  She continues to complain of abdominal pain despite receiving a dose of morphine and multiple doses of fentanyl in the ED. CT abdomen pelvis showing acute sigmoid colitis with small foci of adjacent gas that may be extra luminal and may indicate microperforation.  No abscess or drainable fluid collection. She was admitted for further treatment and care.  General surgery was consulted.  New events last 24 hours / Subjective: Patient continues to complain of discomfort to drain sites.  Continues to have a bowel movement yesterday and one early this morning.  General surgery following, advancing diet to clear liquids today.  Continues on TPN and IV Zosyn.  Right abdominal drain with 65 mL's out past 24 hours and left buttock drain with 5 mL's past 24 hours.  Radiology following drain sites, recommend repeat CT abdomen within 1 week.  Patient without any other complaints this morning.  Continues to be afebrile with normalization of her white blood cell count. Denies headache, no fever/chills/night sweats, no chest pain, no palpitations, no shortness of breath, no  cough/congestion, no paresthesias.  No acute events overnight per nursing staff.  Assessment & Plan:   Principal Problem:   Perforated sigmoid colon  Active Problems:   Colitis   Sepsis (HCC)   Hypokalemia   H/O foot surgery   Sepsis secondary to acute sigmoid colitis with microperforation Sepsis, present on admission Patient presenting to ED with progressive abdominal pain.  CT abdomen/pelvis notable for acute sigmoid colitis with adjacent gas likely secondary to microperforation.  Patient was initially started on IV Zosyn.  During her initial hospitalization, her abdominal pain continued not to improve with resultant fevers and elevated white count.  Repeat CT abdomen/pelvis reveals marked worsening in the pelvis with 2 abscesses.  Interventional radiology was consulted and placed two drains on 04/10/2019.  --General surgery/IR following, appreciate assistance --Output past 24h: Right abdomen 65 mL's, buttock 5 mL's --Blood culture 04/05/2019 no growth x5 days --Wound culture 04/10/2019: Abundant GNR's, moderate GPC's, moderate gram variable rods, awaiting further identification and susceptibilities --Continue Zosyn --Advance diet to clear liquids per general surgery today  --PICC line placed, continues on TPN per general surgery --Continue to monitor drain output and BMs  Rash Maculopapular rash bilateral distribution on her back. Could be due to heat/sweat rash or contact from bedsheets.  She has tolerated IV Zosyn for the past week without any signs of allergic reaction.  This does not appear to be an reaction to Zosyn at this time.   --continue IV Benadryl and hydrocortisone cream prn.  --continue hygiene and change of bed sheets daily  Recent  right foot surgery Currently in a cast, plans to follow-up with her orthopedic surgeon as an outpatient --PT evaluation --Continue nonweightbearing right lower extremity  Hypokalemia Potassium 3.2 today.  Magnesium 2.1 --Replete potassium  today --Repeat electrolytes in a.m. to include magnesium   DVT prophylaxis: Lovenox Code Status: Full Family Communication: None Disposition Plan: Continue inpatient hospitalization, clear liquid diet, on TPN, IV antibiotics, further dependent on clinical course.   Consultants:   General surgery  IR  Procedures:   IR drain placement 04/10/2019  PICC RUE 04/10/2019  Antimicrobials:  Anti-infectives (From admission, onward)   Start     Dose/Rate Route Frequency Ordered Stop   04/05/19 0600  piperacillin-tazobactam (ZOSYN) IVPB 3.375 g     3.375 g 12.5 mL/hr over 240 Minutes Intravenous Every 8 hours 04/05/19 0121     04/05/19 0000  piperacillin-tazobactam (ZOSYN) IVPB 3.375 g     3.375 g 100 mL/hr over 30 Minutes Intravenous  Once 04/04/19 2355 04/05/19 0119       Objective: Vitals:   04/11/19 0144 04/11/19 0512 04/11/19 1243 04/11/19 2219  BP: 124/84 128/84 133/87 121/80  Pulse: (!) 115 (!) 115 (!) 113 (!) 109  Resp: Temp: 99.3 F (37.4 C) 98.9 F (37.2 C) 98.3 F (36.8 C) 98.9 F (37.2 C)  TempSrc: Oral Oral Oral Oral  SpO2: 95% 94% 97% 94%  Weight:      Height:        Intake/Output Summary (Last 24 hours) at 04/12/2019 1139 Last data filed at 04/12/2019 0753 Gross per 24 hour  Intake 2265.7 ml  Output 1155 ml  Net 1110.7 ml   Filed Weights   04/05/19 0953 04/10/19 1059  Weight: 79.9 kg 78.9 kg    Examination: General exam: Appears calm and comfortable  Respiratory system: Clear to auscultation. Respiratory effort normal. Cardiovascular system: S1 & S2 heard, RRR. No JVD, murmurs, rubs, gallops or clicks. No pedal edema. Gastrointestinal system: Abdomen is nondistended, soft and tender to palpation left lower quadrant and pelvic, drains noted Central nervous system: Alert and oriented. No focal neurological deficits. Extremities: Symmetric 5 x 5 power. Skin: + Maculopapular rash overlying her back Psychiatry: Judgement and insight appear  normal. Mood & affect appropriate.    Data Reviewed: I have personally reviewed following labs and imaging studies  CBC: Recent Labs  Lab 04/08/19 0511 04/09/19 0500 04/10/19 0456 04/11/19 0531 04/12/19 0424  WBC 13.0* 13.4* 10.4 9.2 9.0  NEUTROABS  --   --   --  6.7  --   HGB 10.2* 9.8* 9.3* 9.8* 9.4*  HCT 31.7* 30.0* 28.9* 29.4* 27.7*  MCV 94.6 95.2 93.5 90.7 90.5  PLT 295 287 300 318 344   Basic Metabolic Panel: Recent Labs  Lab 04/07/19 0600 04/08/19 0511 04/09/19 0500 04/10/19 0456 04/11/19 0531 04/12/19 0424  NA 138 140 137 135 136 136  K 2.9* 3.3* 3.4* 3.0* 3.0* 3.2*  CL 109 107 109 109 108 100  CO2 19* 23 16* 14* 20* 26  GLUCOSE 84 87 72 66* 131* 116*  BUN <5* 5* 7  CREATININE 0.53 0.51 0.52 0.51 0.45 0.31*  CALCIUM 7.5* 8.0* 7.7* 7.6* 7.8* 7.5*  MG 1.9  --   --   --  1.7 2.1  PHOS  --   --   --   --  2.1* 3.0   GFR: Estimated Creatinine Clearance: 83.6 mL/min (A) (by C-G formula based on SCr of 0.31 mg/dL (L)).  Liver Function Tests: Recent Labs  Lab 04/11/19 0531 04/12/19 0424  AST 18 19  ALT 20 20  ALKPHOS 72 74  BILITOT 0.6 0.4  PROT 5.6* 5.7*  ALBUMIN 2.4* 2.4*   No results for input(s): LIPASE, AMYLASE in the last 168 hours. No results for input(s): AMMONIA in the last 168 hours. Coagulation Profile: Recent Labs  Lab 04/10/19 0456  INR 1.2   Cardiac Enzymes: No results for input(s): CKTOTAL, CKMB, CKMBINDEX, TROPONINI in the last 168 hours. BNP (last 3 results) No results for input(s): PROBNP in the last 8760 hours. HbA1C: No results for input(s): HGBA1C in the last 72 hours. CBG: Recent Labs  Lab 04/11/19 1151 04/11/19 1738 04/12/19 0011 04/12/19 0722 04/12/19 1102  GLUCAP 120* 110* 124* 131* 110*   Lipid Profile: Recent Labs    04/11/19 0531  TRIG 91   Thyroid Function Tests: No results for input(s): TSH, T4TOTAL, FREET4, T3FREE, THYROIDAB in the last 72 hours. Anemia Panel: No results for input(s):  VITAMINB12, FOLATE, FERRITIN, TIBC, IRON, RETICCTPCT in the last 72 hours. Sepsis Labs: No results for input(s): PROCALCITON, LATICACIDVEN in the last 168 hours.  Recent Results (from the past 240 hour(s))  SARS Coronavirus 2 (CEPHEID - Performed in [facility] hospital lab), Hosp Order     Status: None   Collection Time: 04/05/19 12:14 AM   Specimen: Nasopharyngeal Swab  Result Value Ref Range Status   SARS Coronavirus 2 NEGATIVE NEGATIVE Final    Comment: (NOTE) If result is NEGATIVE SARS-CoV-2 target nucleic acids are NOT DETECTED. The SARS-CoV-2 RNA is generally detectable in upper and lower  respiratory specimens during the acute phase of infection. The lowest  concentration of SARS-CoV-2 viral copies this assay can detect is 250  copies / mL. A negative result does not preclude SARS-CoV-2 infection  and should not be used as the sole basis for treatment or other  patient management decisions.  A negative result may occur with  improper specimen collection / handling, submission of specimen other  than nasopharyngeal swab, presence of viral mutation(s) within the  areas targeted by this assay, and inadequate number of viral copies  (<250 copies / mL). A negative result must be combined with clinical  observations, patient history, and epidemiological information. If result is POSITIVE SARS-CoV-2 target nucleic acids are DETECTED. The SARS-CoV-2 RNA is generally detectable in upper and lower  respiratory specimens dur ing the acute phase of infection.  Positive  results are indicative of active infection with SARS-CoV-2.  Clinical  correlation with patient history and other diagnostic information is  necessary to determine patient infection status.  Positive results do  not rule out bacterial infection or co-infection with other viruses. If result is PRESUMPTIVE POSTIVE SARS-CoV-2 nucleic acids MAY BE PRESENT.   A presumptive positive result was obtained on the submitted  specimen  and confirmed on repeat testing.  While 2019 novel coronavirus  (SARS-CoV-2) nucleic acids may be present in the submitted sample  additional confirmatory testing may be necessary for epidemiological  and / or clinical management purposes  to differentiate between  SARS-CoV-2 and other Sarbecovirus currently known to infect humans.  If clinically indicated additional testing with an alternate test  methodology [phone]([id number]) is advised. The SARS-CoV-2 RNA is generally  detectable in upper and lower respiratory sp ecimens during the acute  phase of infection. The expected result is Negative. Fact Sheet for Patients:  BoilerBrush.com.cyhttps://www.fda.gov/media/136312/download Fact Sheet for Healthcare Providers: https://pope.com/https://www.fda.gov/media/136313/download This test is not yet approved or  cleared by the [place] and has been authorized for detection and/or diagnosis of SARS-CoV-2 by FDA under an Emergency Use Authorization (EUA).  This EUA will remain in effect (meaning this test can be used) for the duration of the COVID-19 declaration under Section 564(b)(1) of the Act, 21 U.S.C. section 360bbb-3(b)(1), unless the authorization is terminated or revoked sooner. Performed at [facility], [place] [address] [place], [place] [address]   Culture, blood (routine x 2)     Status: None   Collection Time: 04/05/19  1:10 AM   Specimen: BLOOD RIGHT HAND  Result Value Ref Range Status   Specimen Description   Final    BLOOD RIGHT HAND Performed at [facility], [place] [address] [place], [place] [address]    Special Requests   Final    BOTTLES DRAWN AEROBIC AND ANAEROBIC Blood Culture results may not be optimal due to an excessive volume of blood received in culture bottles Performed at [place] [address], [place], [place] [address]    Culture   Final    NO GROWTH 5 DAYS Performed at [facility], [place] [address] [place], [place]  [address]    Report Status 04/10/2019 FINAL  Final  Culture, blood (routine x 2)     Status: None   Collection Time: 04/05/19  2:53 AM   Specimen: BLOOD  Result Value Ref Range Status   Specimen Description   Final    BLOOD RIGHT ANTECUBITAL Performed at [place] [address] [place], [place] [address]    Special Requests   Final    BOTTLES DRAWN AEROBIC AND ANAEROBIC Blood Culture results may not be optimal due to an excessive volume of blood received in culture bottles Performed at [place] [address], [place], [place] [address]    Culture   Final    NO GROWTH 5 DAYS Performed at [facility], [place] [address] [place], [place] [address]    Report Status 04/10/2019 FINAL  Final  Aerobic/Anaerobic Culture (surgical/deep wound)     Status: None (Preliminary result)   Collection Time: 04/10/19  2:05 PM   Specimen: Abscess  Result Value Ref Range Status   Specimen Description   Final    ABSCESS DIVERTICULAR Performed at [place] [address], [place], [place] [address]    Special Requests   Final    Normal Performed at [facility], [place] [address], [place], [place] [address]    Gram Stain   Final    ABUNDANT WBC PRESENT, PREDOMINANTLY PMN ABUNDANT GRAM NEGATIVE RODS ABUNDANT GRAM POSITIVE COCCI FEW GRAM VARIABLE ROD    Culture   Final    CULTURE REINCUBATED FOR BETTER GROWTH Performed at [facility], [place] [address] [place], [place] [address]    Report Status PENDING  Incomplete  Aerobic/Anaerobic Culture (surgical/deep wound)     Status: None (Preliminary result)   Collection Time: 04/10/19  2:06 PM   Specimen: Abscess  Result Value Ref Range Status   Specimen Description   Final    ABSCESS PELVIC Performed at [place] [address] [place], [place] [address]    Special Requests   Final    Normal Performed at [facility], [place] [address], [place], [place] [address]    Gram Stain   Final    ABUNDANT WBC PRESENT,BOTH PMN AND  MONONUCLEAR ABUNDANT GRAM NEGATIVE RODS MODERATE GRAM POSITIVE COCCI MODERATE GRAM VARIABLE ROD    Culture   Final    CULTURE REINCUBATED FOR BETTER GROWTH Performed at [facility], [address], [place], [place] [address]    Report Status PENDING  Incomplete       Radiology Studies: Ct Image Guided Drainage By Percutaneous Catheter  Result Date: 04/10/2019 CLINICAL DATA:  Sigmoid diverticulitis with rupture and abscesses in the central upper pelvis and deep lower posterior pelvis. EXAM: 1. CT GUIDED CATHETER DRAINAGE OF PERITONEAL DIVERTICULAR ABSCESS 2. CT-GUIDED CATHETER DRAINAGE OF PELVIC PERITONEAL ABSCESS ANESTHESIA/SEDATION: 4.0 mg IV Versed 100 mcg IV Fentanyl Total Moderate Sedation Time:  52 minutes The patient's level of consciousness and physiologic status were continuously monitored during the procedure by Radiology nursing. PROCEDURE: The procedure, risks, benefits, and alternatives were explained to the patient. Questions regarding the procedure were encouraged and answered. The patient understands and consents to the procedure. A time out was performed prior to initiating the procedure. The anterior abdominal wall followed by the right transgluteal region were prepped with chlorhexidine in a sterile fashion, and a sterile drape was applied covering the operative field. A sterile gown and sterile gloves were used for the procedure. Local anesthesia was provided with 1% Lidocaine. An 18 gauge trocar needle was advanced from a right anterior approach to the level of a sigmoid diverticular abscess. After confirming needle tip position, aspiration was performed. A guidewire was advanced and the needle removed. The tract was dilated over the wire. A 10 French percutaneous drainage catheter was then advanced over the wire and formed. Catheter position was confirmed by  CT. Additional fluid sample was aspirated from the drain. The drainage catheter was then flushed with saline and attached to a suction bulb. A Prolene retention suture was applied at the skin exit site. The patient was then placed in a prone position and from a right transgluteal approach, an 18 gauge trocar needle was advanced to the level a second posterior lower pelvic abscess abscess. After confirming needle tip position, aspiration was performed. A guidewire was advanced and the needle removed. The tract was dilated over the wire. A 10 French percutaneous drainage catheter was then advanced over the wire and formed. Catheter position was confirmed by CT. Additional fluid sample was aspirated from the drain. The drainage catheter was then flushed with saline and attached to a suction bulb. A Prolene retention suture was applied at the skin exit site. COMPLICATIONS: None FINDINGS: There was an anterior approach available to the level of the central lower abdominal sigmoid diverticular abscess. Aspiration yielded feculent material. Additional aspiration via a drainage catheter yielded purulent and feculent fluid. A 10 French drain was formed in the abscess cavity. Persistent posterior pelvic abscess was able to be accessed from a right transgluteal approach. Aspiration yielded feculent and purulent fluid. A 10 French drain was formed in the abscess. IMPRESSION: CT-guided percutaneous drainage of 2 separate peritoneal abscess collections related to ruptured sigmoid diverticulitis. A 10 French drain was placed from an anterior approach into the sigmoid diverticular abscess. A second 10 French drain was placed from a posterior approach into the posterior deep pelvic abscess. Both drains were attached to suction bulbs. Electronically Signed   By: Irish Lack M.D.   On: 04/10/2019 16:41   Ct Image Guided Drainage By Percutaneous Catheter  Result Date: 04/10/2019 CLINICAL DATA:  Sigmoid diverticulitis with  rupture and abscesses in the central upper pelvis and deep lower  posterior pelvis. EXAM: 1. CT GUIDED CATHETER DRAINAGE OF PERITONEAL DIVERTICULAR ABSCESS 2. CT-GUIDED CATHETER DRAINAGE OF PELVIC PERITONEAL ABSCESS ANESTHESIA/SEDATION: 4.0 mg IV Versed 100 mcg IV Fentanyl Total Moderate Sedation Time:  52 minutes The patient's level of consciousness and physiologic status were continuously monitored during the procedure by Radiology nursing. PROCEDURE: The procedure, risks, benefits, and alternatives were explained to the patient. Questions regarding the procedure were encouraged and answered. The patient understands and consents to the procedure. A time out was performed prior to initiating the procedure. The anterior abdominal wall followed by the right transgluteal region were prepped with chlorhexidine in a sterile fashion, and a sterile drape was applied covering the operative field. A sterile gown and sterile gloves were used for the procedure. Local anesthesia was provided with 1% Lidocaine. An 18 gauge trocar needle was advanced from a right anterior approach to the level of a sigmoid diverticular abscess. After confirming needle tip position, aspiration was performed. A guidewire was advanced and the needle removed. The tract was dilated over the wire. A 10 French percutaneous drainage catheter was then advanced over the wire and formed. Catheter position was confirmed by CT. Additional fluid sample was aspirated from the drain. The drainage catheter was then flushed with saline and attached to a suction bulb. A Prolene retention suture was applied at the skin exit site. The patient was then placed in a prone position and from a right transgluteal approach, an 18 gauge trocar needle was advanced to the level a second posterior lower pelvic abscess abscess. After confirming needle tip position, aspiration was performed. A guidewire was advanced and the needle removed. The tract was dilated over the wire. A  10 French percutaneous drainage catheter was then advanced over the wire and formed. Catheter position was confirmed by CT. Additional fluid sample was aspirated from the drain. The drainage catheter was then flushed with saline and attached to a suction bulb. A Prolene retention suture was applied at the skin exit site. COMPLICATIONS: None FINDINGS: There was an anterior approach available to the level of the central lower abdominal sigmoid diverticular abscess. Aspiration yielded feculent material. Additional aspiration via a drainage catheter yielded purulent and feculent fluid. A 10 French drain was formed in the abscess cavity. Persistent posterior pelvic abscess was able to be accessed from a right transgluteal approach. Aspiration yielded feculent and purulent fluid. A 10 French drain was formed in the abscess. IMPRESSION: CT-guided percutaneous drainage of 2 separate peritoneal abscess collections related to ruptured sigmoid diverticulitis. A 10 French drain was placed from an anterior approach into the sigmoid diverticular abscess. A second 10 French drain was placed from a posterior approach into the posterior deep pelvic abscess. Both drains were attached to suction bulbs. Electronically Signed   By: Irish LackGlenn  Yamagata M.D.   On: 04/10/2019 16:41      Scheduled Meds:  enoxaparin (LOVENOX) injection  40 mg Subcutaneous Q24H   feeding supplement  1 Container Oral BID BM   fluticasone  1 spray Each Nare Daily   insulin aspart  0-9 Units Subcutaneous TID WC   pantoprazole  20 mg Oral BID   sodium chloride flush  10 mL Intracatheter Q8H   sodium chloride flush  10-40 mL Intracatheter Q12H   Continuous Infusions:  sodium chloride Stopped (04/05/19 1756)   sodium chloride Stopped (04/12/19 0729)   methocarbamol (ROBAXIN) IV     piperacillin-tazobactam (ZOSYN)  IV 12.5 mL/hr at 04/12/19 0735   potassium chloride 10 mEq (04/12/19 1125)  TPN ADULT (ION) 60 mL/hr at 04/12/19 0735    TPN ADULT (ION)       LOS: 7 days    Time spent: 28 minutes   Alvira Philips [place], DO [facility] [email or web address] 04/12/2019, 11:39 AM

## 2019-04-12 NOTE — Progress Notes (Signed)
Referring Physician(s): Gross,S  Supervising Physician: Irish LackYamagata, Glenn  Patient Status:  WL IP  Chief Complaint: Abdominal/pelvic pain, diverticular abscesses   Subjective: Patient sitting up in chair; she feels better since abd/pelvic drains were placed but does have some soreness at drain insertion sites.  Denies nausea /vomiting.   Allergies: Codeine  Medications: Prior to Admission medications   Medication Sig Start Date End Date Taking? Authorizing Provider  acetaminophen (TYLENOL) 325 MG tablet Take 650 mg by mouth every 6 (six) hours as needed (for back pain/pain.).   Yes [provider]  ALPRAZolam (XANAX) 0.25 MG tablet Take 0.25 mg by mouth 2 (two) times daily as needed for anxiety.   Yes [provider]  ASPIRIN ADULT LOW STRENGTH 81 MG EC tablet Take 81 mg by mouth daily. 03/27/19  Yes [provider]  cetirizine (ZYRTEC) 10 MG tablet Take 10 mg by mouth daily.   Yes [provider]  eletriptan (RELPAX) 40 MG tablet Take 40 mg by mouth as needed for migraine or headache. May repeat in 2 hours if headache persists or recurs.   Yes [provider]  fluticasone (FLONASE) 50 MCG/ACT nasal spray Place 1 spray into both nostrils daily.   Yes [provider]  Homeopathic Products [facility] - Eat(SIMILASAN ALLERGY EYE RELIEF OP) Apply to eye.   Yes [provider]  ibuprofen (ADVIL,MOTRIN) 200 MG tablet Take 600 mg by mouth 2 (two) times daily as needed (for pain.).   Yes [provider]  LACTASE PO Take 5 mg by mouth daily as needed (for milk consumption).   Yes [provider]  meloxicam (MOBIC) 15 MG tablet Take 15 mg by mouth daily as needed for muscle spasms. 03/14/19  Yes [provider]  montelukast (SINGULAIR) 10 MG tablet Take 10 mg by mouth daily at 2 PM. 04/11/17  Yes [provider]  ondansetron (ZOFRAN) 4 MG tablet Take 4 mg by mouth daily as needed for nausea. 03/27/19  Yes  [provider]  traMADol (ULTRAM) 50 MG tablet Take 25-50 mg by mouth 3 (three) times daily as needed (for back pain.).   Yes [provider]  valACYclovir (VALTREX) 500 MG tablet Take 500 mg by mouth 2 (two) times daily as needed. For cold sores 03/24/17  Yes [provider]  oxyCODONE (OXY IR/ROXICODONE) 5 MG immediate release tablet Take 5 mg by mouth every 4 (four) hours as needed for pain. 03/27/19   [provider]     Vital Signs: BP 121/80 (BP Location: Left Arm)    Pulse (!) 109    Temp 98.9 F (37.2 C) (Oral)    Resp 14    Ht 5\' 4"  (1.626 m)    Wt 173 lb 15.1 oz (78.9 kg)    SpO2 94%    BMI 29.86 kg/m   Physical Exam awake, alert.  Central lower quadrant and transgluteal drains intact; sites clean and dry, mildly tender to palpation, output from central lower drain 65 cc, transgluteal drain about 5 to 10 cc of turbid beige-colored/likely feculent fluid  Imaging: Ct Abdomen Pelvis W Contrast  Result Date: 04/09/2019 CLINICAL DATA:  Elevated white blood cell count and patient with abdominal pain for 1 week. EXAM: CT ABDOMEN AND PELVIS WITH CONTRAST TECHNIQUE: Multidetector CT imaging of the abdomen and pelvis was performed using the standard protocol following bolus administration of intravenous contrast. CONTRAST:  100 mL OMNIPAQUE IOHEXOL 300 MG/ML  SOLN COMPARISON:  CT abdomen pelvis 04/04/2019. FINDINGS: Lower  chest: Heart size is normal. No pleural or pericardial effusion. Minimal right basilar atelectasis noted. Hepatobiliary: The gallbladder is distended but otherwise unremarkable. The liver and biliary tree appear normal. Pancreas: Unremarkable. No pancreatic ductal dilatation or surrounding inflammatory changes. Spleen: Normal in size without focal abnormality. Adrenals/Urinary Tract: Small cyst lower pole right kidney incidentally noted. The kidneys otherwise appear normal. Ureters and urinary bladder are normal in appearance. Stomach/Bowel:  Extensive inflammatory change about the sigmoid colon has markedly worsened. There is an air and fluid collection posterior and to the right of the uterus which measures approximately 6.5 cm transverse by 9 cm AP by 5 cm craniocaudal consistent with an abscess. A second air and fluid collection consistent with abscess more superiorly in the central pelvis measures 4 cm AP by 3.5 cm transverse by 7.5 cm craniocaudal. This collection is contains predominantly gas and it overlies a loop of small bowel which appears inflamed. Presacral edema is identified. The walls of the sigmoid colon are markedly thickened inflamed. The colon otherwise appears normal. The stomach, small bowel and appendix are normal in appearance. Vascular/Lymphatic: No significant vascular findings are present. No enlarged abdominal or pelvic lymph nodes. Reproductive: Uterus and bilateral adnexa are unremarkable. IUD noted. Other: None. Musculoskeletal: No acute or focal bony abnormality. Severe left hip osteoarthritis noted. IMPRESSION: Marked worsening findings in the pelvis most consistent with acute sigmoid diverticulitis with 2 new abscesses identified as described above. These results were called by telephone at the time of interpretation on 04/09/2019 at 2:03 pm to [facility] Valley[facility]ILLARD JENNINGS, P.A., Who verbally acknowledged these results. Electronically Signed   By: Drusilla Kannerhomas  Dalessio M.D.   On: 04/09/2019 14:07   Ct Image Guided Drainage By Percutaneous Catheter  Result Date: 04/10/2019 CLINICAL DATA:  Sigmoid diverticulitis with rupture and abscesses in the central upper pelvis and deep lower posterior pelvis. EXAM: 1. CT GUIDED CATHETER DRAINAGE OF PERITONEAL DIVERTICULAR ABSCESS 2. CT-GUIDED CATHETER DRAINAGE OF PELVIC PERITONEAL ABSCESS ANESTHESIA/SEDATION: 4.0 mg IV Versed 100 mcg IV Fentanyl Total Moderate Sedation Time:  52 minutes The patient's level of consciousness and physiologic status were continuously monitored during the procedure by  Radiology nursing. PROCEDURE: The procedure, risks, benefits, and alternatives were explained to the patient. Questions regarding the procedure were encouraged and answered. The patient understands and consents to the procedure. A time out was performed prior to initiating the procedure. The anterior abdominal wall followed by the right transgluteal region were prepped with chlorhexidine in a sterile fashion, and a sterile drape was applied covering the operative field. A sterile gown and sterile gloves were used for the procedure. Local anesthesia was provided with 1% Lidocaine. An 18 gauge trocar needle was advanced from a right anterior approach to the level of a sigmoid diverticular abscess. After confirming needle tip position, aspiration was performed. A guidewire was advanced and the needle removed. The tract was dilated over the wire. A 10 French percutaneous drainage catheter was then advanced over the wire and formed. Catheter position was confirmed by CT. Additional fluid sample was aspirated from the drain. The drainage catheter was then flushed with saline and attached to a suction bulb. A Prolene retention suture was applied at the skin exit site. The patient was then placed in a prone position and from a right transgluteal approach, an 18 gauge trocar needle was advanced to the level a second posterior lower pelvic abscess abscess. After confirming needle tip position, aspiration was performed. A guidewire was advanced and the needle removed. The tract was  dilated over the wire. A 10 French percutaneous drainage catheter was then advanced over the wire and formed. Catheter position was confirmed by CT. Additional fluid sample was aspirated from the drain. The drainage catheter was then flushed with saline and attached to a suction bulb. A Prolene retention suture was applied at the skin exit site. COMPLICATIONS: None FINDINGS: There was an anterior approach available to the level of the central lower  abdominal sigmoid diverticular abscess. Aspiration yielded feculent material. Additional aspiration via a drainage catheter yielded purulent and feculent fluid. A 10 French drain was formed in the abscess cavity. Persistent posterior pelvic abscess was able to be accessed from a right transgluteal approach. Aspiration yielded feculent and purulent fluid. A 10 French drain was formed in the abscess. IMPRESSION: CT-guided percutaneous drainage of 2 separate peritoneal abscess collections related to ruptured sigmoid diverticulitis. A 10 French drain was placed from an anterior approach into the sigmoid diverticular abscess. A second 10 French drain was placed from a posterior approach into the posterior deep pelvic abscess. Both drains were attached to suction bulbs. Electronically Signed   By: Aletta Edouard M.D.   On: 04/10/2019 16:41   Ct Image Guided Drainage By Percutaneous Catheter  Result Date: 04/10/2019 CLINICAL DATA:  Sigmoid diverticulitis with rupture and abscesses in the central upper pelvis and deep lower posterior pelvis. EXAM: 1. CT GUIDED CATHETER DRAINAGE OF PERITONEAL DIVERTICULAR ABSCESS 2. CT-GUIDED CATHETER DRAINAGE OF PELVIC PERITONEAL ABSCESS ANESTHESIA/SEDATION: 4.0 mg IV Versed 100 mcg IV Fentanyl Total Moderate Sedation Time:  52 minutes The patient's level of consciousness and physiologic status were continuously monitored during the procedure by Radiology nursing. PROCEDURE: The procedure, risks, benefits, and alternatives were explained to the patient. Questions regarding the procedure were encouraged and answered. The patient understands and consents to the procedure. A time out was performed prior to initiating the procedure. The anterior abdominal wall followed by the right transgluteal region were prepped with chlorhexidine in a sterile fashion, and a sterile drape was applied covering the operative field. A sterile gown and sterile gloves were used for the procedure. Local  anesthesia was provided with 1% Lidocaine. An 18 gauge trocar needle was advanced from a right anterior approach to the level of a sigmoid diverticular abscess. After confirming needle tip position, aspiration was performed. A guidewire was advanced and the needle removed. The tract was dilated over the wire. A 10 French percutaneous drainage catheter was then advanced over the wire and formed. Catheter position was confirmed by CT. Additional fluid sample was aspirated from the drain. The drainage catheter was then flushed with saline and attached to a suction bulb. A Prolene retention suture was applied at the skin exit site. The patient was then placed in a prone position and from a right transgluteal approach, an 18 gauge trocar needle was advanced to the level a second posterior lower pelvic abscess abscess. After confirming needle tip position, aspiration was performed. A guidewire was advanced and the needle removed. The tract was dilated over the wire. A 10 French percutaneous drainage catheter was then advanced over the wire and formed. Catheter position was confirmed by CT. Additional fluid sample was aspirated from the drain. The drainage catheter was then flushed with saline and attached to a suction bulb. A Prolene retention suture was applied at the skin exit site. COMPLICATIONS: None FINDINGS: There was an anterior approach available to the level of the central lower abdominal sigmoid diverticular abscess. Aspiration yielded feculent material. Additional aspiration via a  drainage catheter yielded purulent and feculent fluid. A 10 French drain was formed in the abscess cavity. Persistent posterior pelvic abscess was able to be accessed from a right transgluteal approach. Aspiration yielded feculent and purulent fluid. A 10 French drain was formed in the abscess. IMPRESSION: CT-guided percutaneous drainage of 2 separate peritoneal abscess collections related to ruptured sigmoid diverticulitis. A 10  French drain was placed from an anterior approach into the sigmoid diverticular abscess. A second 10 French drain was placed from a posterior approach into the posterior deep pelvic abscess. Both drains were attached to suction bulbs. Electronically Signed   By: Irish LackGlenn  Yamagata M.D.   On: 04/10/2019 16:41   [place] Ekg Site Rite  Result Date: 04/10/2019 If Site Rite image not attached, placement could not be confirmed due to current cardiac rhythm.   Labs:  CBC: Recent Labs    04/09/19 0500 04/10/19 0456 04/11/19 0531 04/12/19 0424  WBC 13.4* 10.4 9.2 9.0  HGB 9.8* 9.3* 9.8* 9.4*  HCT 30.0* 28.9* 29.4* 27.7*  PLT 287 300 318 344    COAGS: Recent Labs    04/10/19 0456  INR 1.2    BMP: Recent Labs    04/09/19 0500 04/10/19 0456 04/11/19 0531 04/12/19 0424  NA 137 135 136 136  K 3.4* 3.0* 3.0* 3.2*  CL 109 109 108 100  CO2 16* 14* 20* 26  GLUCOSE 72 66* 131* 116*  BUN 6 <5* 5* 7  CALCIUM 7.7* 7.6* 7.8* 7.5*  CREATININE 0.52 0.51 0.45 0.31*  GFRNONAA >60 >60 >60 >60  GFRAA >60 >60 >60 >60    LIVER FUNCTION TESTS: Recent Labs    04/04/19 2100 04/11/19 0531 04/12/19 0424  BILITOT 0.8 0.6 0.4  AST 20 18 19   ALT 18 20 20   ALKPHOS 101 72 74  PROT 7.7 5.6* 5.7*  ALBUMIN 4.2 2.4* 2.4*    Assessment and Plan: Patient with history of ruptured sigmoid diverticulitis with associated abscesses, status post drain placements x2 on 04/10/19; afebrile; WBC nl; hgb 9.4(9.8), creat .31, K 3.2- replace; drain fluid cx pend; blood cx neg; continue with drain irrigation and output monitoring.  Obtain follow-up CT within 1 week of drain placement with likely drain injection as well.   Electronically Signed: D. Jeananne RamaKevin Almarie Kurdziel, [name] 04/12/2019, 11:22 AM   I spent a total of 15 minutes at the the patient's bedside AND on the patient's hospital floor or unit, greater than 50% of which was counseling/coordinating care for abdominal/pelvic abscess drains    Patient ID: Molly Beck  Langland, female   DOB: July 23, 1966, 53 y.o.   MRN: [id number]

## 2019-04-12 NOTE — Progress Notes (Signed)
PHARMACY - ADULT TOTAL PARENTERAL NUTRITION CONSULT NOTE   Pharmacy Consult for TPN Indication: intolerance to enteral feedings, diverticulitis  Patient Measurements: Height: 5\' 4"  (162.6 cm) Weight: 173 lb 15.1 oz (78.9 kg) IBW/kg (Calculated) : 54.7 TPN AdjBW (KG): 61 Body mass index is 29.86 kg/m.  Insulin Requirements: 0 units SSI last 24hr  Current Nutrition: advanced to CL starting 6/11  IVF: NS with 10mEq KCL at 35 ml/hr  Central access:  PICC to be placed 6/9 TPN start date: 6/9  ASSESSMENT                                                                                                          HPI: 53 yo female with sigmoid diverticulitis with abscesses, now worsening to have IR place drain today. Patient has been intolerant to enteral feedings for about a week now so plan to place PICC line and start TPN per [facility] orders today  Significant events:   Today, 04/12/19  Glucose - CBGs after TPN started - 109, 119   Electrolytes - stable WNL except K low at 3.2 - 6 runs IV KCL already ordered per Md, phos up to 3 after repletion 6/10  Renal - SCr 0.45 stable  LFTs - WNL  TGs - 91 (6/10)  Prealbumin - pe<5 (6/10)  NUTRITIONAL GOALS                                                                                             RD recs: Kcal/day: 2000-2240, protein/day: 95-105g  Custom TPN to provide 99g protein per day and 1915 kcal/day at goal rate of 75 ml/hr (96% of estimated needs per RD)  PLAN                                                                                                                         At 1800 today:  Continue Custom TPN and increase rate from 60 ml/hr to goal 75 ml/hr  TPN will contain standard electrolytes except: increase Na at 75, K at 80, and max acetate. Note did not increase K amount in TPN this time as increase in rate will provide 5mEq more KCL  per 24hr along with 6 runs IV KCL ordered today  Plan to advance as tolerated to the  goal rate.  TPN to contain standard multivitamins daily and trace elements on MWF due to national shortage  [facility]  Change CBG/SSI to TID with meals  TPN lab panels on Mondays & Thursdays.  F/u daily.    Hessie KnowsJustin M Brydon Spahr, PharmD, BCPS 04/12/2019 9:56 AM

## 2019-04-12 NOTE — Progress Notes (Signed)
[facility] Progress Note     Subjective: CC-  Feeling better today than yesterday. Still has some intermittent abdominal pain, but feels that more of her pain is around the drains today. Small BM this morning. Denies n/v. WBC 9, afebrile.  Objective: Vital signs in last 24 hours: Temp:  [98.3 F (36.8 C)-98.9 F (37.2 C)] 98.9 F (37.2 C) (06/10 2219) Pulse Rate:  [109-113] 109 (06/10 2219) Resp:  [14-18] 14 (06/10 2219) BP: (121-133)/(80-87) 121/80 (06/10 2219) SpO2:  [94 %-97 %] 94 % (06/10 2219) Last BM Date: 04/12/19  Intake/Output from previous day: 06/10 0701 - 06/11 0700 In: 2554.3 [I.V.:1599.5; IV Piggyback:914.8] Out: 1670 [Urine:1600; Drains:70] Intake/Output this shift: Total I/O In: 423.6 [I.V.:387.4; Other:15; IV Piggyback:21.3] Out: 300 [Urine:300]  PE: Gen: Alert, NAD, pleasant HEENT: EOM's intact, pupils equal and round Pulm: effort normal Abd: Soft,mild distension, +BS, no HSM,mild TTP LLQ and around anterior drain without peritonitis, anterior and TG drain with tan/feculent output ZOX:WRUEAVExt:Splint to RLE. L calf soft and nontender Psych: A&Ox3  Skin: no rashes noted, warm and dry   Lab Results:  Recent Labs    04/11/19 0531 04/12/19 0424  WBC 9.2 9.0  HGB 9.8* 9.4*  HCT 29.4* 27.7*  PLT 318 344   BMET Recent Labs    04/11/19 0531 04/12/19 0424  NA 136 136  K 3.0* 3.2*  CL 108 100  CO2 20* 26  GLUCOSE 131* 116*  BUN 5* 7  CREATININE 0.45 0.31*  CALCIUM 7.8* 7.5*   PT/INR Recent Labs    04/10/19 0456  LABPROT 15.3*  INR 1.2   CMP     Component Value Date/Time   NA 136 04/12/2019 0424   K 3.2 (L) 04/12/2019 0424   CL 100 04/12/2019 0424   CO2 26 04/12/2019 0424   GLUCOSE 116 (H) 04/12/2019 0424   BUN 7 04/12/2019 0424   CREATININE 0.31 (L) 04/12/2019 0424   CALCIUM 7.5 (L) 04/12/2019 0424   PROT 5.7 (L) 04/12/2019 0424   ALBUMIN 2.4 (L) 04/12/2019 0424   AST 19 04/12/2019 0424   ALT 20 04/12/2019 0424    ALKPHOS 74 04/12/2019 0424   BILITOT 0.4 04/12/2019 0424   GFRNONAA >60 04/12/2019 0424   GFRAA >60 04/12/2019 0424   Lipase     Component Value Date/Time   LIPASE 25 09/12/2015 1615       Studies/Results: Ct Image Guided Drainage By Percutaneous Catheter  Result Date: 04/10/2019 CLINICAL DATA:  Sigmoid diverticulitis with rupture and abscesses in the central upper pelvis and deep lower posterior pelvis. EXAM: 1. CT GUIDED CATHETER DRAINAGE OF PERITONEAL DIVERTICULAR ABSCESS 2. CT-GUIDED CATHETER DRAINAGE OF PELVIC PERITONEAL ABSCESS ANESTHESIA/SEDATION: 4.0 mg IV Versed 100 mcg IV Fentanyl Total Moderate Sedation Time:  52 minutes The patient's level of consciousness and physiologic status were continuously monitored during the procedure by Radiology nursing. PROCEDURE: The procedure, risks, benefits, and alternatives were explained to the patient. Questions regarding the procedure were encouraged and answered. The patient understands and consents to the procedure. A time out was performed prior to initiating the procedure. The anterior abdominal wall followed by the right transgluteal region were prepped with chlorhexidine in a sterile fashion, and a sterile drape was applied covering the operative field. A sterile gown and sterile gloves were used for the procedure. Local anesthesia was provided with 1% Lidocaine. An 18 gauge trocar needle was advanced from a right anterior approach to the level of a sigmoid diverticular abscess. After confirming needle  tip position, aspiration was performed. A guidewire was advanced and the needle removed. The tract was dilated over the wire. A 10 French percutaneous drainage catheter was then advanced over the wire and formed. Catheter position was confirmed by CT. Additional fluid sample was aspirated from the drain. The drainage catheter was then flushed with saline and attached to a suction bulb. A Prolene retention suture was applied at the skin exit  site. The patient was then placed in a prone position and from a right transgluteal approach, an 18 gauge trocar needle was advanced to the level a second posterior lower pelvic abscess abscess. After confirming needle tip position, aspiration was performed. A guidewire was advanced and the needle removed. The tract was dilated over the wire. A 10 French percutaneous drainage catheter was then advanced over the wire and formed. Catheter position was confirmed by CT. Additional fluid sample was aspirated from the drain. The drainage catheter was then flushed with saline and attached to a suction bulb. A Prolene retention suture was applied at the skin exit site. COMPLICATIONS: None FINDINGS: There was an anterior approach available to the level of the central lower abdominal sigmoid diverticular abscess. Aspiration yielded feculent material. Additional aspiration via a drainage catheter yielded purulent and feculent fluid. A 10 French drain was formed in the abscess cavity. Persistent posterior pelvic abscess was able to be accessed from a right transgluteal approach. Aspiration yielded feculent and purulent fluid. A 10 French drain was formed in the abscess. IMPRESSION: CT-guided percutaneous drainage of 2 separate peritoneal abscess collections related to ruptured sigmoid diverticulitis. A 10 French drain was placed from an anterior approach into the sigmoid diverticular abscess. A second 10 French drain was placed from a posterior approach into the posterior deep pelvic abscess. Both drains were attached to suction bulbs. Electronically Signed   By: Irish LackGlenn  Yamagata M.D.   On: 04/10/2019 16:41   Ct Image Guided Drainage By Percutaneous Catheter  Result Date: 04/10/2019 CLINICAL DATA:  Sigmoid diverticulitis with rupture and abscesses in the central upper pelvis and deep lower posterior pelvis. EXAM: 1. CT GUIDED CATHETER DRAINAGE OF PERITONEAL DIVERTICULAR ABSCESS 2. CT-GUIDED CATHETER DRAINAGE OF PELVIC  PERITONEAL ABSCESS ANESTHESIA/SEDATION: 4.0 mg IV Versed 100 mcg IV Fentanyl Total Moderate Sedation Time:  52 minutes The patient's level of consciousness and physiologic status were continuously monitored during the procedure by Radiology nursing. PROCEDURE: The procedure, risks, benefits, and alternatives were explained to the patient. Questions regarding the procedure were encouraged and answered. The patient understands and consents to the procedure. A time out was performed prior to initiating the procedure. The anterior abdominal wall followed by the right transgluteal region were prepped with chlorhexidine in a sterile fashion, and a sterile drape was applied covering the operative field. A sterile gown and sterile gloves were used for the procedure. Local anesthesia was provided with 1% Lidocaine. An 18 gauge trocar needle was advanced from a right anterior approach to the level of a sigmoid diverticular abscess. After confirming needle tip position, aspiration was performed. A guidewire was advanced and the needle removed. The tract was dilated over the wire. A 10 French percutaneous drainage catheter was then advanced over the wire and formed. Catheter position was confirmed by CT. Additional fluid sample was aspirated from the drain. The drainage catheter was then flushed with saline and attached to a suction bulb. A Prolene retention suture was applied at the skin exit site. The patient was then placed in a prone position and from a  right transgluteal approach, an 18 gauge trocar needle was advanced to the level a second posterior lower pelvic abscess abscess. After confirming needle tip position, aspiration was performed. A guidewire was advanced and the needle removed. The tract was dilated over the wire. A 10 French percutaneous drainage catheter was then advanced over the wire and formed. Catheter position was confirmed by CT. Additional fluid sample was aspirated from the drain. The drainage  catheter was then flushed with saline and attached to a suction bulb. A Prolene retention suture was applied at the skin exit site. COMPLICATIONS: None FINDINGS: There was an anterior approach available to the level of the central lower abdominal sigmoid diverticular abscess. Aspiration yielded feculent material. Additional aspiration via a drainage catheter yielded purulent and feculent fluid. A 10 French drain was formed in the abscess cavity. Persistent posterior pelvic abscess was able to be accessed from a right transgluteal approach. Aspiration yielded feculent and purulent fluid. A 10 French drain was formed in the abscess. IMPRESSION: CT-guided percutaneous drainage of 2 separate peritoneal abscess collections related to ruptured sigmoid diverticulitis. A 10 French drain was placed from an anterior approach into the sigmoid diverticular abscess. A second 10 French drain was placed from a posterior approach into the posterior deep pelvic abscess. Both drains were attached to suction bulbs. Electronically Signed   By: Aletta Edouard M.D.   On: 04/10/2019 16:41   [place] Ekg Site Rite  Result Date: 04/10/2019 If Site Rite image not attached, placement could not be confirmed due to current cardiac rhythm.   Anti-infectives: Anti-infectives (From admission, onward)   Start     Dose/Rate Route Frequency Ordered Stop   04/05/19 0600  piperacillin-tazobactam (ZOSYN) IVPB 3.375 g     3.375 g 12.5 mL/hr over 240 Minutes Intravenous Every 8 hours 04/05/19 0121     04/05/19 0000  piperacillin-tazobactam (ZOSYN) IVPB 3.375 g     3.375 g 100 mL/hr over 30 Minutes Intravenous  Once 04/04/19 2355 04/05/19 0119       Assessment/Plan GERD Osteoarthritis - takes meloxicam and tramadol at baseline Recent right ankle surgery by Dr. Hewitt5/26/2020 Malnutrition - prealbumin <5, on TPN  Sigmoiddiverticulitis with abscesses - no prior history ofdiverticulitis - never had a colonoscopy before - CT scan  6/3 showed acute sigmoid colitis with small foci of adjacent gasindicative ofmicro perforation, no abscess or drainable fluid collection - CT 6/9 showed worsening sigmoid diverticulitis with 2 new abscesses - s/p IR drain x2 placement 6/9, culture pending  ID -zosyn 6/4>> VTE -SCD,lovenox FEN -IVF, TPN, CLD Foley -none Follow up -TBD  Plan:Advance to clear liquids, Boost breeze. Continue IV zosyn and follow drain cultures. Mobilize.    LOS: 7 days    Wellington Hampshire , [facility] 04/12/2019, 7:57 AM Pager: [phone] Mon-Thurs 7:00 am-4:30 pm Fri 7:00 am -11:30 AM Sat-Sun 7:00 am-11:30 am

## 2019-04-13 LAB — BASIC METABOLIC PANEL
Anion gap: 7 (ref 5–15)
BUN: 11 mg/dL (ref 6–20)
CO2: 25 mmol/L (ref 22–32)
Calcium: 7.8 mg/dL — ABNORMAL LOW (ref 8.9–10.3)
Chloride: 103 mmol/L (ref 98–111)
Creatinine, Ser: 0.39 mg/dL — ABNORMAL LOW (ref 0.44–1.00)
GFR calc Af Amer: >60 mL/min (ref >60)
GFR calc non Af Amer: >60 mL/min (ref >60)
Glucose, Bld: 128 mg/dL — ABNORMAL HIGH (ref 70–99)
Potassium: 4.1 mmol/L (ref 3.5–5.1)
Sodium: 135 mmol/L (ref 135–145)

## 2019-04-13 LAB — GLUCOSE, CAPILLARY
Glucose-Capillary: 129 mg/dL — ABNORMAL HIGH (ref 70–99)
Glucose-Capillary: 101 mg/dL — ABNORMAL HIGH (ref 70–99)
Glucose-Capillary: 106 mg/dL — ABNORMAL HIGH (ref 70–99)
Glucose-Capillary: 89 mg/dL (ref 70–99)
Glucose-Capillary: 111 mg/dL — ABNORMAL HIGH (ref 70–99)

## 2019-04-13 LAB — CBC
HCT: 28 % — ABNORMAL LOW (ref 36.0–46.0)
Hemoglobin: 9.3 g/dL — ABNORMAL LOW (ref 12.0–15.0)
MCH: 30.3 pg (ref 26.0–34.0)
MCHC: 33.2 g/dL (ref 30.0–36.0)
MCV: 91.2 fL (ref 80.0–100.0)
Platelets: 360 10*3/uL (ref 150–400)
RBC: 3.07 MIL/uL — ABNORMAL LOW (ref 3.87–5.11)
RDW: 14 % (ref 11.5–15.5)
WBC: 8.1 10*3/uL (ref 4.0–10.5)
nRBC: 0 % (ref 0.0–0.2)

## 2019-04-13 LAB — AEROBIC/ANAEROBIC CULTURE W GRAM STAIN (SURGICAL/DEEP WOUND)
Special Requests: NORMAL
Special Requests: NORMAL

## 2019-04-13 LAB — MAGNESIUM: Magnesium: 2.2 mg/dL (ref 1.7–2.4)

## 2019-04-13 LAB — PHOSPHORUS: Phosphorus: 4 mg/dL (ref 2.5–4.6)

## 2019-04-13 MED ORDER — ALUM & MAG HYDROXIDE-SIMETH 200-200-20 MG/5ML PO SUSP
30.0000 mL | ORAL | Status: DC | PRN
  Administered 2019-04-13 – 2019-04-14 (×2): 30 mL via ORAL
  Filled 2019-04-13 (×2): qty 30

## 2019-04-13 MED ORDER — TRAVASOL 10 % IV SOLN
INTRAVENOUS | Status: AC
  Administered 2019-04-13: 17:00:00 via INTRAVENOUS
  Filled 2019-04-13 (×3): qty 990

## 2019-04-13 NOTE — Progress Notes (Signed)
Physical Therapy Treatment Patient Details Name: Molly Beck MRN: [id number] DOB: 1966/10/06 Today's Date: 04/13/2019    History of Present Illness Pt admitted with Colitis/sepsis and is s/p R foot reconstructive surgery. s/p placement of 2 drains 04/10/19.     PT Comments    Pt assisted to bathroom and then ambulated in hallway with RW.  Pt fatigues quickly and required seated rest break.  Pt reports pain still present however more tolerable today for mobility.    Follow Up Recommendations  No PT follow up     Equipment Recommendations  None recommended by PT    Recommendations for Other Services       Precautions / Restrictions Precautions Precautions: Fall Restrictions Weight Bearing Restrictions: Yes RLE Weight Bearing: Non weight bearing    Mobility  Bed Mobility Overal bed mobility: Modified Independent                Transfers Overall transfer level: Needs assistance Equipment used: Rolling walker (2 wheeled) Transfers: Sit to/from Stand Sit to Stand: Min guard         General transfer comment: cues for use of UEs to self assist  Ambulation/Gait Ambulation/Gait assistance: Min guard;Min assist Gait Distance (Feet): 35 Feet(x2) Assistive device: Rolling walker (2 wheeled)   Gait velocity: decr   General Gait Details: pt maintains NWB well, tends to rest R foot on top of Left foot with fatigue, assisted to bathroom and then ambulated 35 feet in hall, seated rest break due to fatigue and pain and then pt ambulated back to room, min assist upon returning to room due to fatigue   Stairs             Wheelchair Mobility    Modified Rankin (Stroke Patients Only)       Balance                                            Cognition Arousal/Alertness: Awake/alert Behavior During Therapy: WFL for tasks assessed/performed Overall Cognitive Status: Within Functional Limits for tasks assessed                                        Exercises      General Comments        Pertinent Vitals/Pain Pain Assessment: Faces Faces Pain Scale: Hurts even more Pain Location: drain sites and abdomen/ribs Pain Descriptors / Indicators: Aching;Tender;Sore Pain Intervention(s): Limited activity within patient's tolerance;Monitored during session;Premedicated before session    Home Living                      Prior Function            PT Goals (current goals can now be found in the care plan section) Progress towards PT goals: Progressing toward goals    Frequency    Min 3X/week      PT Plan Current plan remains appropriate    Co-evaluation              AM-PAC PT "6 Clicks" Mobility   Outcome Measure  Help needed turning from your back to your side while in a flat bed without using bedrails?: None Help needed moving from lying on your back to sitting on the side of a flat bed  without using bedrails?: None Help needed moving to and from a bed to a chair (including a wheelchair)?: A Little Help needed standing up from a chair using your arms (e.g., wheelchair or bedside chair)?: A Little Help needed to walk in hospital room?: A Little Help needed climbing 3-5 steps with a railing? : A Little 6 Click Score: 20    End of Session Equipment Utilized During Treatment: Gait belt Activity Tolerance: Patient limited by fatigue Patient left: in bed;with call bell/phone within reach Nurse Communication: Mobility status PT Visit Diagnosis: Difficulty in walking, not elsewhere classified (R26.2)     Time: [id number] PT Time Calculation (min) (ACUTE ONLY): 22 min  Charges:  $Gait Training: 8-22 mins                     Zenovia JarredKati Besan Ketchem, PT, DPT Acute Rehabilitation Services Office: [phone] Pager: [phone] Sarajane JewsLEMYRE,KATHrine E 04/13/2019, 4:44 PM

## 2019-04-13 NOTE — Progress Notes (Signed)
PROGRESS NOTE    Molly Beck  ZOX:[id number] DOB: 04-27-1966 DOA: 04/04/2019 PCP: Clementeen Graham, [name]     Brief Narrative:   Molly Beck is a 53 y.o. female with medical history significant of GERD, elective surgery of her right foot a week ago presenting to the hospital for evaluation of abdominal pain.  Patient states she had a reconstructive right foot surgery done a week ago.  States since then she has been constipated due to taking pain medications.  Her last bowel movement was prior to the surgery.  For the past few days she is having left lower quadrant abdominal pain which became worse for the past 24 hours.  The pain is sharp and excruciating.  States today she called her surgeon's office and was advised to take magnesium citrate for constipation.  She vomited soon after taking magnesium citrate.  Denies any fevers.  She continues to complain of abdominal pain despite receiving a dose of morphine and multiple doses of fentanyl in the ED. CT abdomen pelvis showing acute sigmoid colitis with small foci of adjacent gas that may be extra luminal and may indicate microperforation.  No abscess or drainable fluid collection. She was admitted for further treatment and care.  General surgery was consulted.  New events last 24 hours / Subjective: Patient reports severe crampy abdominal pain while attempting a bowel movement this morning.  Tolerating clear liquid diet.  Drains functioning well with 30 mL's out of each past 24 hours.  Bowel movement yesterday.  No other complaints this morning. Denies headache, no fever/chills/night sweats, no chest pain, no palpitations, no shortness of breath, no cough/congestion, no paresthesias.  No acute events overnight per nursing staff.  Assessment & Plan:   Principal Problem:   Perforated sigmoid colon  Active Problems:   Colitis   Sepsis (HCC)   Hypokalemia   H/O foot surgery   Sepsis secondary to acute sigmoid colitis with  microperforation Sepsis, present on admission Patient presenting to ED with progressive abdominal pain.  CT abdomen/pelvis notable for acute sigmoid colitis with adjacent gas likely secondary to microperforation.  Patient was initially started on IV Zosyn.  During her initial hospitalization, her abdominal pain continued not to improve with resultant fevers and elevated white count.  Repeat CT abdomen/pelvis reveals marked worsening in the pelvis with 2 abscesses.  Interventional radiology was consulted and placed two drains on 04/10/2019.  --General surgery/IR following, appreciate assistance --Output past 24h: Right abdomen 30 mL's, buttock 30 mL's --Blood culture 04/05/2019 no growth x5 days --Wound culture 04/10/2019: Abundant GNR's, moderate GPC's, moderate gram variable rods, awaiting further identification and susceptibilities --Continue Zosyn --Advance diet to full liquids per general surgery today  --PICC line placed, continues on TPN per general surgery --Continue to monitor drain output and BMs --Radiology plans on repeat CT scan within 1 week to evaluate drains  Rash Maculopapular rash bilateral distribution on her back. Could be due to heat/sweat rash or contact from bedsheets.  She has tolerated IV Zosyn for the past week without any signs of allergic reaction.  This does not appear to be an reaction to Zosyn at this time.   --continue IV Benadryl and hydrocortisone cream prn.  --continue hygiene and change of bed sheets daily  Recent right foot surgery Currently in a cast, plans to follow-up with her orthopedic surgeon as an outpatient --PT evaluation --Continue nonweightbearing right lower extremity  Hypokalemia Potassium 4.1 today.  Magnesium --Repeat electrolytes in a.m. to include magnesium  DVT prophylaxis: Lovenox Code Status: Full Family Communication: None Disposition Plan: Continue inpatient hospitalization, transition to full liquid diet, on TPN, IV antibiotics,  further dependent on clinical course.   Consultants:   General surgery  IR  Procedures:   IR drain placement 04/10/2019  PICC RUE 04/10/2019  Antimicrobials:  Anti-infectives (From admission, onward)   Start     Dose/Rate Route Frequency Ordered Stop   04/05/19 0600  piperacillin-tazobactam (ZOSYN) IVPB 3.375 g     3.375 g 12.5 mL/hr over 240 Minutes Intravenous Every 8 hours 04/05/19 0121     04/05/19 0000  piperacillin-tazobactam (ZOSYN) IVPB 3.375 g     3.375 g 100 mL/hr over 30 Minutes Intravenous  Once 04/04/19 2355 04/05/19 0119       Objective: Vitals:   04/11/19 2219 04/12/19 1259 04/12/19 2159 04/13/19 0435  BP: 121/80 114/72 111/78 126/89  Pulse: (!) 109 (!) 102 (!) 109 (!) 107  Resp: 14 18 16 14   Temp: 98.9 F (37.2 C) 98.6 F (37 C) 98.4 F (36.9 C) 98.5 F (36.9 C)  TempSrc: Oral Oral Oral Oral  SpO2: 94% 99% 97% 99%  Weight:      Height:        Intake/Output Summary (Last 24 hours) at 04/13/2019 1219 Last data filed at 04/13/2019 1000 Gross per 24 hour  Intake 2929.28 ml  Output 60 ml  Net 2869.28 ml   Filed Weights   04/05/19 0953 04/10/19 1059  Weight: 79.9 kg 78.9 kg    Examination: General exam: Appears calm and comfortable  Respiratory system: Clear to auscultation. Respiratory effort normal. Cardiovascular system: S1 & S2 heard, RRR. No JVD, murmurs, rubs, gallops or clicks. No pedal edema. Gastrointestinal system: Abdomen is nondistended, soft and tender to palpation left lower quadrant and pelvic, bowel sounds present, drains noted Central nervous system: Alert and oriented. No focal neurological deficits. Extremities: Symmetric 5 x 5 power. Skin: + Maculopapular rash overlying her back Psychiatry: Judgement and insight appear normal. Mood & affect appropriate.    Data Reviewed: I have personally reviewed following labs and imaging studies  CBC: Recent Labs  Lab 04/09/19 0500 04/10/19 0456 04/11/19 0531 04/12/19 0424 04/13/19  0327  WBC 13.4* 10.4 9.2 9.0 8.1  NEUTROABS  --   --  6.7  --   --   HGB 9.8* 9.3* 9.8* 9.4* 9.3*  HCT 30.0* 28.9* 29.4* 27.7* 28.0*  MCV 95.2 93.5 90.7 90.5 91.2  PLT 287 300 318 344 360   Basic Metabolic Panel: Recent Labs  Lab 04/07/19 0600  04/09/19 0500 04/10/19 0456 04/11/19 0531 04/12/19 0424 04/13/19 0327  NA 138   < > 137 135 136 136 135  K 2.9*   < > 3.4* 3.0* 3.0* 3.2* 4.1  CL 109   < > 109 109 108 100 103  CO2 19*   < > 16* 14* 20* 26 25  GLUCOSE 84   < > 72 66* 131* 116* 128*  BUN 11   < > 6 <5* 5* 7 11  CREATININE 0.53   < > 0.52 0.51 0.45 0.31* 0.39*  CALCIUM 7.5*   < > 7.7* 7.6* 7.8* 7.5* 7.8*  MG 1.9  --   --   --  1.7 2.1 2.2  PHOS  --   --   --   --  2.1* 3.0 4.0   < > = values in this interval not displayed.   GFR: Estimated Creatinine Clearance: 83.6 mL/min (A) (by C-G formula  based on SCr of 0.39 mg/dL (L)). Liver Function Tests: Recent Labs  Lab 04/11/19 0531 04/12/19 0424  AST 18 19  ALT 20 20  ALKPHOS 72 74  BILITOT 0.6 0.4  PROT 5.6* 5.7*  ALBUMIN 2.4* 2.4*   No results for input(s): LIPASE, AMYLASE in the last 168 hours. No results for input(s): AMMONIA in the last 168 hours. Coagulation Profile: Recent Labs  Lab 04/10/19 0456  INR 1.2   Cardiac Enzymes: No results for input(s): CKTOTAL, CKMB, CKMBINDEX, TROPONINI in the last 168 hours. BNP (last 3 results) No results for input(s): PROBNP in the last 8760 hours. HbA1C: No results for input(s): HGBA1C in the last 72 hours. CBG: Recent Labs  Lab 04/12/19 1102 04/12/19 1652 04/13/19 0646 04/13/19 0805 04/13/19 1132  GLUCAP 110* 96 129* 101* 106*   Lipid Profile: Recent Labs    04/11/19 0531  TRIG 91   Thyroid Function Tests: No results for input(s): TSH, T4TOTAL, FREET4, T3FREE, THYROIDAB in the last 72 hours. Anemia Panel: No results for input(s): VITAMINB12, FOLATE, FERRITIN, TIBC, IRON, RETICCTPCT in the last 72 hours. Sepsis Labs: No results for input(s):  PROCALCITON, LATICACIDVEN in the last 168 hours.  Recent Results (from the past 240 hour(s))  SARS Coronavirus 2 (CEPHEID - Performed in [place] hospital lab), Hosp Order     Status: None   Collection Time: 04/05/19 12:14 AM   Specimen: Nasopharyngeal Swab  Result Value Ref Range Status   SARS Coronavirus 2 NEGATIVE NEGATIVE Final    Comment: (NOTE) If result is NEGATIVE SARS-CoV-2 target nucleic acids are NOT DETECTED. The SARS-CoV-2 RNA is generally detectable in upper and lower  respiratory specimens during the acute phase of infection. The lowest  concentration of SARS-CoV-2 viral copies this assay can detect is 250  copies / mL. A negative result does not preclude SARS-CoV-2 infection  and should not be used as the sole basis for treatment or other  patient management decisions.  A negative result may occur with  improper specimen collection / handling, submission of specimen other  than nasopharyngeal swab, presence of viral mutation(s) within the  areas targeted by this assay, and inadequate number of viral copies  (<250 copies / mL). A negative result must be combined with clinical  observations, patient history, and epidemiological information. If result is POSITIVE SARS-CoV-2 target nucleic acids are DETECTED. The SARS-CoV-2 RNA is generally detectable in upper and lower  respiratory specimens dur ing the acute phase of infection.  Positive  results are indicative of active infection with SARS-CoV-2.  Clinical  correlation with patient history and other diagnostic information is  necessary to determine patient infection status.  Positive results do  not rule out bacterial infection or co-infection with other viruses. If result is PRESUMPTIVE POSTIVE SARS-CoV-2 nucleic acids MAY BE PRESENT.   A presumptive positive result was obtained on the submitted specimen  and confirmed on repeat testing.  While 2019 novel coronavirus  (SARS-CoV-2) nucleic acids may be present in  the submitted sample  additional confirmatory testing may be necessary for epidemiological  and / or clinical management purposes  to differentiate between  SARS-CoV-2 and other Sarbecovirus currently known to infect humans.  If clinically indicated additional testing with an alternate test  methodology [phone]) is advised. The SARS-CoV-2 RNA is generally  detectable in upper and lower respiratory sp ecimens during the acute  phase of infection. The expected result is Negative. Fact Sheet for Patients:  StrictlyIdeas.no Fact Sheet for Healthcare Providers: BankingDealers.co.za  This test is not yet approved or cleared by the [place] FDA and has been authorized for detection and/or diagnosis of SARS-CoV-2 by FDA under an Emergency Use Authorization (EUA).  This EUA will remain in effect (meaning this test can be used) for the duration of the COVID-19 declaration under Section 564(b)(1) of the Act, 21 U.S.C. section 360bbb-3(b)(1), unless the authorization is terminated or revoked sooner. Performed at [facility], [address] [place], [place] [address]   Culture, blood (routine x 2)     Status: None   Collection Time: 04/05/19  1:10 AM   Specimen: BLOOD RIGHT HAND  Result Value Ref Range Status   Specimen Description   Final    BLOOD RIGHT HAND Performed at [facility], [address] [place], [place] [address]    Special Requests   Final    BOTTLES DRAWN AEROBIC AND ANAEROBIC Blood Culture results may not be optimal due to an excessive volume of blood received in culture bottles Performed at [facility], [address] [place], [place] [address]    Culture   Final    NO GROWTH 5 DAYS Performed at [facility], [address] [place], [place] [address]    Report Status 04/10/2019 FINAL  Final  Culture, blood (routine x 2)     Status: None   Collection Time: 04/05/19   2:53 AM   Specimen: BLOOD  Result Value Ref Range Status   Specimen Description   Final    BLOOD RIGHT ANTECUBITAL Performed at [facility], [address] [place], [place] [address]    Special Requests   Final    BOTTLES DRAWN AEROBIC AND ANAEROBIC Blood Culture results may not be optimal due to an excessive volume of blood received in culture bottles Performed at [facility], [address] [place], [place] [address]    Culture   Final    NO GROWTH 5 DAYS Performed at [facility], [address] [place], [place] [address]    Report Status 04/10/2019 FINAL  Final  Aerobic/Anaerobic Culture (surgical/deep wound)     Status: None (Preliminary result)   Collection Time: 04/10/19  2:05 PM   Specimen: Abscess  Result Value Ref Range Status   Specimen Description   Final    ABSCESS DIVERTICULAR Performed at [facility], [address] [place], [place] [address]    Special Requests   Final    Normal Performed at [facility], [address] [place], [place] [address]    Gram Stain   Final    ABUNDANT WBC PRESENT, PREDOMINANTLY PMN ABUNDANT GRAM NEGATIVE RODS ABUNDANT GRAM POSITIVE COCCI FEW GRAM VARIABLE ROD    Culture   Final    CULTURE REINCUBATED FOR BETTER GROWTH HOLDING FOR POSSIBLE ANAEROBE Performed at [facility], [address] [place], [place] [address]    Report Status PENDING  Incomplete  Aerobic/Anaerobic Culture (surgical/deep wound)     Status: None   Collection Time: 04/10/19  2:06 PM   Specimen: Abscess  Result Value Ref Range Status   Specimen Description   Final    ABSCESS PELVIC Performed at [facility], [address] [place], [place] [address]    Special Requests   Final    Normal Performed at [facility], [address] [place], [place] [address]    Gram Stain   Final  ABUNDANT WBC PRESENT,BOTH PMN AND  MONONUCLEAR ABUNDANT GRAM NEGATIVE RODS MODERATE GRAM POSITIVE COCCI MODERATE GRAM VARIABLE ROD Performed at [facility], [address] [facility] [place], [place] [address]    Culture   Final    ABUNDANT MULTIPLE ORGANISMS PRESENT, NONE PREDOMINANT ABUNDANT MIXED ANAEROBIC FLORA PRESENT.  CALL LAB IF FURTHER IID REQUIRED.    Report Status 04/13/2019 FINAL  Final       Radiology Studies: No results found.    Scheduled Meds: . enoxaparin (LOVENOX) injection  40 mg Subcutaneous Q24H  . feeding supplement  1 Container Oral BID BM  . fluticasone  1 spray Each Nare Daily  . insulin aspart  0-9 Units Subcutaneous TID WC  . pantoprazole  20 mg Oral BID  . sodium chloride flush  10 mL Intracatheter Q8H  . sodium chloride flush  10-40 mL Intracatheter Q12H   Continuous Infusions: . sodium chloride Stopped (04/05/19 1756)  . sodium chloride 10 mL/hr at 04/12/19 2206  . methocarbamol (ROBAXIN) IV    . piperacillin-tazobactam (ZOSYN)  IV 3.375 g (04/13/19 0530)  . TPN ADULT (ION) 75 mL/hr at 04/13/19 1000  . TPN ADULT (ION)       LOS: 8 days    Time spent: 28 minutes   Alvira PhilipsEric J [place], DO [facility] [email or web address] 04/13/2019, 12:19 PM

## 2019-04-13 NOTE — Progress Notes (Addendum)
[facility] [place] [facility] Progress Note     Subjective: CC-  States that she has had a rough morning. She was having worsening crampy abdominal pain this morning, felt like she needed to have a BM. She did have a formed BM yesterday. Tolerating clear liquids. PO intake does not worsen pain. Denies n/v. Drains working, each with about 30cc out last 24 hours. WBC 8.1  Objective: Vital signs in last 24 hours: Temp:  [98.4 F (36.9 C)-98.6 F (37 C)] 98.5 F (36.9 C) (06/12 0435) Pulse Rate:  [102-109] 107 (06/12 0435) Resp:  [14-18] 14 (06/12 0435) BP: (111-126)/(72-89) 126/89 (06/12 0435) SpO2:  [97 %-99 %] 99 % (06/12 0435) Last BM Date: 04/12/19  Intake/Output from previous day: 06/11 0701 - 06/12 0700 In: 3433.7 [P.O.:470; I.V.:1967.4; IV Piggyback:941.3] Out: 360 [Urine:300; Drains:60] Intake/Output this shift: No intake/output data recorded.  PE: Gen: Alert, NAD, pleasant HEENT: EOM's intact, pupils equal and round Pulm: effort normal Abd: Soft,mild distension, +BS, no HSM,mild TTP LLQand around anterior drainwithout peritonitis, anterior and TG drain with tan/feculent output DGL:OVFIEP to RLE. L calf soft and nontender Psych: A&Ox3  Skin: no rashes noted, warm and dry   Lab Results:  Recent Labs    04/12/19 0424 04/13/19 0327  WBC 9.0 8.1  HGB 9.4* 9.3*  HCT 27.7* 28.0*  PLT 344 360   BMET Recent Labs    04/12/19 0424 04/13/19 0327  NA 136 135  K 3.2* 4.1  CL 100 103  CO2 26 25  GLUCOSE 116* 128*  BUN 7 11  CREATININE 0.31* 0.39*  CALCIUM 7.5* 7.8*   PT/INR No results for input(s): LABPROT, INR in the last 72 hours. CMP     Component Value Date/Time   NA 135 04/13/2019 0327   K 4.1 04/13/2019 0327   CL 103 04/13/2019 0327   CO2 25 04/13/2019 0327   GLUCOSE 128 (H) 04/13/2019 0327   BUN 11 04/13/2019 0327   CREATININE 0.39 (L) 04/13/2019 0327   CALCIUM 7.8 (L) 04/13/2019 0327   PROT 5.7 (L) 04/12/2019 0424   ALBUMIN 2.4 (L)  04/12/2019 0424   AST 19 04/12/2019 0424   ALT 20 04/12/2019 0424   ALKPHOS 74 04/12/2019 0424   BILITOT 0.4 04/12/2019 0424   GFRNONAA >60 04/13/2019 0327   GFRAA >60 04/13/2019 0327   Lipase     Component Value Date/Time   LIPASE 25 09/12/2015 1615       Studies/Results: No results found.  Anti-infectives: Anti-infectives (From admission, onward)   Start     Dose/Rate Route Frequency Ordered Stop   04/05/19 0600  piperacillin-tazobactam (ZOSYN) IVPB 3.375 g     3.375 g 12.5 mL/hr over 240 Minutes Intravenous Every 8 hours 04/05/19 0121     04/05/19 0000  piperacillin-tazobactam (ZOSYN) IVPB 3.375 g     3.375 g 100 mL/hr over 30 Minutes Intravenous  Once 04/04/19 2355 04/05/19 0119       Assessment/Plan GERD Osteoarthritis - takes meloxicam and tramadol at baseline Recent right ankle surgery by Dr. Hewitt5/26/2020 Malnutrition - prealbumin <5, on TPN  Sigmoiddiverticulitis with abscesses  - no prior history ofdiverticulitis  - never had a colonoscopy before  - CT scan 6/3 showed acute sigmoid colitis with small foci of adjacent gasindicative ofmicro perforation, no abscess or drainable fluid collection  - CT 6/9 showed worsening sigmoid diverticulitis with 2 new abscesses  - s/p IR drain x2 placement 6/9, cultures pending  ID -zosyn 6/4>> VTE -SCD,lovenox FEN -IVF, TPN,  FLD Foley -none Follow up -TBD  Plan:Ok for full liquids but take diet slow. Continue IV zosyn and follow drain cultures. Mobilize.   IR planning for repeat CT within 1 week of drain placement.   LOS: 8 days    Franne FortsBrooke A Meuth , [facility] 04/13/2019, 8:57 AM Pager: [phone]  Agree with above. She's had a rough day.  But she did walk.  It was not easy.   She has not progressed as rapidly as was hoped.  Ovidio Kinavid Ryliee Figge, MD, [facility] Pager: [phone] Office phone:  [phone]

## 2019-04-13 NOTE — Plan of Care (Signed)

## 2019-04-13 NOTE — Progress Notes (Signed)
PHARMACY - ADULT TOTAL PARENTERAL NUTRITION CONSULT NOTE   Pharmacy Consult for TPN Indication: intolerance to enteral feedings, diverticulitis  Patient Measurements: Height: 5\' 4"  (162.6 cm) Weight: 173 lb 15.1 oz (78.9 kg) IBW/kg (Calculated) : 54.7 TPN AdjBW (KG): 61 Body mass index is 29.86 kg/m.  Insulin Requirements: 1 units SSI last 24hr  Current Nutrition: advanced to CL starting 6/11 - advance to [facility] per [facility] on 6/12  [facility]F: [facility] at [facility] access:  PICC to be placed 6/9 TPN start date: 6/9  ASSESSMENT                                                                                                          HPI: 53 yo female with sigmoid diverticulitis with abscesses, now worsening to have IR place drain today. Patient has been intolerant to enteral feedings for about a week now so plan to place PICC line and start TPN per [facility] orders today  Significant events:   Today, 04/13/19  Glucose - CBGs at goal < 150 - range 96-131  Electrolytes - stable WNL  Renal - SCr 0.45 stable  LFTs - WNL  TGs - 91 (6/10)  Prealbumin - pe<5 (6/10)  NUTRITIONAL GOALS                                                                                             RD recs: Kcal/day: 2000-2240, protein/day: 95-105g  Custom TPN to provide 99g protein per day and 1915 kcal/day at goal rate of 75 ml/hr (96% of estimated needs per RD)  PLAN                                                                                                                         At 1800 today:  Continue Custom TPN at goal rate of 75 ml/hr  TPN will contain standard electrolytes except: increase Na at 75, K at 80, and max acetate.   TPN to contain standard multivitamins daily and trace elements on MWF due to national shortage  Continue IVF at [facility].  Change CBG/SSI to TID with meals  TPN lab panels on Mondays & Thursdays.  With diet  [facility], hopeful to start weaning and d/c TPN beginning  tomorrow 6/13   Hessie KnowsJustin M Krina Mraz, PharmD, BCPS 04/13/2019 9:26 AM

## 2019-04-13 NOTE — Progress Notes (Signed)
Referring Physician(s): Gross,S  Supervising Physician: Irish LackYamagata, Glenn  Patient Status:  WL IP  Chief Complaint: Abdominal/pelvic pain, diverticular abscesses   Subjective: Patient doing okay today, did have some abdominal discomfort during bowel movements.  Still sore at drain insertion sites.  Denies nausea or vomiting.  Allergies: Codeine  Medications: Prior to Admission medications   Medication Sig Start Date End Date Taking? Authorizing Provider  acetaminophen (TYLENOL) 325 MG tablet Take 650 mg by mouth every 6 (six) hours as needed (for back pain/pain.).   Yes [provider]  ALPRAZolam (XANAX) 0.25 MG tablet Take 0.25 mg by mouth 2 (two) times daily as needed for anxiety.   Yes [provider]  ASPIRIN ADULT LOW STRENGTH 81 MG EC tablet Take 81 mg by mouth daily. 03/27/19  Yes [provider]  cetirizine (ZYRTEC) 10 MG tablet Take 10 mg by mouth daily.   Yes [provider]  eletriptan (RELPAX) 40 MG tablet Take 40 mg by mouth as needed for migraine or headache. May repeat in 2 hours if headache persists or recurs.   Yes [provider]  fluticasone (FLONASE) 50 MCG/ACT nasal spray Place 1 spray into both nostrils daily.   Yes [provider]  Homeopathic Products [facility](SIMILASAN ALLERGY EYE RELIEF OP) Apply to eye.   Yes [provider]  ibuprofen (ADVIL,MOTRIN) 200 MG tablet Take 600 mg by mouth 2 (two) times daily as needed (for pain.).   Yes [provider]  LACTASE PO Take 5 mg by mouth daily as needed (for milk consumption).   Yes [provider]  meloxicam (MOBIC) 15 MG tablet Take 15 mg by mouth daily as needed for muscle spasms. 03/14/19  Yes [provider]  montelukast (SINGULAIR) 10 MG tablet Take 10 mg by mouth daily at 2 PM. 04/11/17  Yes [provider]  ondansetron (ZOFRAN) 4 MG tablet Take 4 mg by mouth daily as needed for nausea. 03/27/19  Yes [provider]  traMADol (ULTRAM) 50 MG tablet Take 25-50 mg by mouth 3 (three) times daily as needed (for back pain.).   Yes [provider]  valACYclovir (VALTREX) 500 MG tablet Take 500 mg by mouth 2 (two) times daily as needed. For cold sores 03/24/17  Yes [provider]  oxyCODONE (OXY IR/ROXICODONE) 5 MG immediate release tablet Take 5 mg by mouth every 4 (four) hours as needed for pain. 03/27/19   [provider]     Vital Signs: BP 113/76 (BP Location: Left Arm)    Pulse (!) 106    Temp 98.4 F (36.9 C) (Oral)    Resp 16    Ht 5\' 4"  (1.626 m)    Wt 173 lb 15.1 oz (78.9 kg)    SpO2 99%    BMI 29.86 kg/m   Physical Exam awake, alert.  Central pelvic /right transgluteal drains intact, outputs 30 cc feculent fluid each.  Both drains irrigated without difficulty; dressings clean and dry, areas mildly tender  Imaging: Ct Image Guided Drainage By Percutaneous Catheter  Result Date: 04/10/2019 CLINICAL DATA:  Sigmoid diverticulitis with rupture and abscesses in the central upper pelvis and deep lower posterior pelvis. EXAM: 1. CT GUIDED CATHETER DRAINAGE OF PERITONEAL DIVERTICULAR ABSCESS 2. CT-GUIDED CATHETER DRAINAGE OF PELVIC PERITONEAL ABSCESS ANESTHESIA/SEDATION: 4.0 mg IV Versed 100 mcg IV Fentanyl Total Moderate Sedation Time:  52 minutes The patient's level of consciousness and physiologic status were continuously monitored during the procedure by Radiology nursing. PROCEDURE: The  procedure, risks, benefits, and alternatives were explained to the patient. Questions regarding the procedure were encouraged and answered. The patient understands and consents to the procedure. A time out was performed prior to initiating the procedure. The anterior abdominal wall followed by the right transgluteal region were prepped with chlorhexidine in a sterile fashion, and a sterile drape was applied covering the operative field. A sterile gown and sterile gloves were used for the  procedure. Local anesthesia was provided with 1% Lidocaine. An 18 gauge trocar needle was advanced from a right anterior approach to the level of a sigmoid diverticular abscess. After confirming needle tip position, aspiration was performed. A guidewire was advanced and the needle removed. The tract was dilated over the wire. A 10 French percutaneous drainage catheter was then advanced over the wire and formed. Catheter position was confirmed by CT. Additional fluid sample was aspirated from the drain. The drainage catheter was then flushed with saline and attached to a suction bulb. A Prolene retention suture was applied at the skin exit site. The patient was then placed in a prone position and from a right transgluteal approach, an 18 gauge trocar needle was advanced to the level a second posterior lower pelvic abscess abscess. After confirming needle tip position, aspiration was performed. A guidewire was advanced and the needle removed. The tract was dilated over the wire. A 10 French percutaneous drainage catheter was then advanced over the wire and formed. Catheter position was confirmed by CT. Additional fluid sample was aspirated from the drain. The drainage catheter was then flushed with saline and attached to a suction bulb. A Prolene retention suture was applied at the skin exit site. COMPLICATIONS: None FINDINGS: There was an anterior approach available to the level of the central lower abdominal sigmoid diverticular abscess. Aspiration yielded feculent material. Additional aspiration via a drainage catheter yielded purulent and feculent fluid. A 10 French drain was formed in the abscess cavity. Persistent posterior pelvic abscess was able to be accessed from a right transgluteal approach. Aspiration yielded feculent and purulent fluid. A 10 French drain was formed in the abscess. IMPRESSION: CT-guided percutaneous drainage of 2 separate peritoneal abscess collections related to ruptured sigmoid  diverticulitis. A 10 French drain was placed from an anterior approach into the sigmoid diverticular abscess. A second 10 French drain was placed from a posterior approach into the posterior deep pelvic abscess. Both drains were attached to suction bulbs. Electronically Signed   By: Aletta Edouard M.D.   On: 04/10/2019 16:41   Ct Image Guided Drainage By Percutaneous Catheter  Result Date: 04/10/2019 CLINICAL DATA:  Sigmoid diverticulitis with rupture and abscesses in the central upper pelvis and deep lower posterior pelvis. EXAM: 1. CT GUIDED CATHETER DRAINAGE OF PERITONEAL DIVERTICULAR ABSCESS 2. CT-GUIDED CATHETER DRAINAGE OF PELVIC PERITONEAL ABSCESS ANESTHESIA/SEDATION: 4.0 mg IV Versed 100 mcg IV Fentanyl Total Moderate Sedation Time:  52 minutes The patient's level of consciousness and physiologic status were continuously monitored during the procedure by Radiology nursing. PROCEDURE: The procedure, risks, benefits, and alternatives were explained to the patient. Questions regarding the procedure were encouraged and answered. The patient understands and consents to the procedure. A time out was performed prior to initiating the procedure. The anterior abdominal wall followed by the right transgluteal region were prepped with chlorhexidine in a sterile fashion, and a sterile drape was applied covering the operative field. A sterile gown and sterile gloves were used for the procedure. Local anesthesia was provided with 1% Lidocaine. An 18 gauge  trocar needle was advanced from a right anterior approach to the level of a sigmoid diverticular abscess. After confirming needle tip position, aspiration was performed. A guidewire was advanced and the needle removed. The tract was dilated over the wire. A 10 French percutaneous drainage catheter was then advanced over the wire and formed. Catheter position was confirmed by CT. Additional fluid sample was aspirated from the drain. The drainage catheter was then  flushed with saline and attached to a suction bulb. A Prolene retention suture was applied at the skin exit site. The patient was then placed in a prone position and from a right transgluteal approach, an 18 gauge trocar needle was advanced to the level a second posterior lower pelvic abscess abscess. After confirming needle tip position, aspiration was performed. A guidewire was advanced and the needle removed. The tract was dilated over the wire. A 10 French percutaneous drainage catheter was then advanced over the wire and formed. Catheter position was confirmed by CT. Additional fluid sample was aspirated from the drain. The drainage catheter was then flushed with saline and attached to a suction bulb. A Prolene retention suture was applied at the skin exit site. COMPLICATIONS: None FINDINGS: There was an anterior approach available to the level of the central lower abdominal sigmoid diverticular abscess. Aspiration yielded feculent material. Additional aspiration via a drainage catheter yielded purulent and feculent fluid. A 10 French drain was formed in the abscess cavity. Persistent posterior pelvic abscess was able to be accessed from a right transgluteal approach. Aspiration yielded feculent and purulent fluid. A 10 French drain was formed in the abscess. IMPRESSION: CT-guided percutaneous drainage of 2 separate peritoneal abscess collections related to ruptured sigmoid diverticulitis. A 10 French drain was placed from an anterior approach into the sigmoid diverticular abscess. A second 10 French drain was placed from a posterior approach into the posterior deep pelvic abscess. Both drains were attached to suction bulbs. Electronically Signed   By: Irish LackGlenn  Yamagata M.D.   On: 04/10/2019 16:41   [place] Ekg Site Rite  Result Date: 04/10/2019 If Site Rite image not attached, placement could not be confirmed due to current cardiac rhythm.   Labs:  CBC: Recent Labs    04/10/19 0456 04/11/19 0531  04/12/19 0424 04/13/19 0327  WBC 10.4 9.2 9.0 8.1  HGB 9.3* 9.8* 9.4* 9.3*  HCT 28.9* 29.4* 27.7* 28.0*  PLT 300 318 344 360    COAGS: Recent Labs    04/10/19 0456  INR 1.2    BMP: Recent Labs    04/10/19 0456 04/11/19 0531 04/12/19 0424 04/13/19 0327  NA 135 136 136 135  K 3.0* 3.0* 3.2* 4.1  CL 109 108 100 103  CO2 14* 20* 26 25  GLUCOSE 66* 131* 116* 128*  BUN <5* 5* 7 11  CALCIUM 7.6* 7.8* 7.5* 7.8*  CREATININE 0.51 0.45 0.31* 0.39*  GFRNONAA >60 >60 >60 >60  GFRAA >60 >60 >60 >60    LIVER FUNCTION TESTS: Recent Labs    04/04/19 2100 04/11/19 0531 04/12/19 0424  BILITOT 0.8 0.6 0.4  AST 20 18 19   ALT 18 20 20   ALKPHOS 101 72 74  PROT 7.7 5.6* 5.7*  ALBUMIN 4.2 2.4* 2.4*    Assessment and Plan: Patient with history of ruptured sigmoid diverticulitis with associated abscesses, status post drain placements x2 on 04/10/19; afebrile; WBC nl;hgb stable; creat 0.39, drain fluid cx- mult org; drain output feculent; continue with drain irrigation and output monitoring.  Obtain follow-up CT within  1 week of drain placement with likely drain injection as well.   Electronically Signed: D. Jeananne RamaKevin Aedon Deason, [name] 04/13/2019, 3:09 PM   I spent a total of 15 minutes at the the patient's bedside AND on the patient's hospital floor or unit, greater than 50% of which was counseling/coordinating care for abdominal/pelvic abscess drains    Patient ID: Molly Beck, female   DOB: December 12, 1965, 53 y.o.   MRN: [id number]

## 2019-04-14 LAB — CBC
HCT: 29.3 % — ABNORMAL LOW (ref 36.0–46.0)
Hemoglobin: 9.4 g/dL — ABNORMAL LOW (ref 12.0–15.0)
MCH: 30.2 pg (ref 26.0–34.0)
MCHC: 32.1 g/dL (ref 30.0–36.0)
MCV: 94.2 fL (ref 80.0–100.0)
Platelets: 407 10*3/uL — ABNORMAL HIGH (ref 150–400)
RBC: 3.11 MIL/uL — ABNORMAL LOW (ref 3.87–5.11)
RDW: 14.3 % (ref 11.5–15.5)
WBC: 9.8 10*3/uL (ref 4.0–10.5)
nRBC: 0 % (ref 0.0–0.2)

## 2019-04-14 LAB — GLUCOSE, CAPILLARY
Glucose-Capillary: 125 mg/dL — ABNORMAL HIGH (ref 70–99)
Glucose-Capillary: 107 mg/dL — ABNORMAL HIGH (ref 70–99)
Glucose-Capillary: 83 mg/dL (ref 70–99)

## 2019-04-14 LAB — BASIC METABOLIC PANEL
Anion gap: 9 (ref 5–15)
BUN: 16 mg/dL (ref 6–20)
CO2: 26 mmol/L (ref 22–32)
Calcium: 8.2 mg/dL — ABNORMAL LOW (ref 8.9–10.3)
Chloride: 101 mmol/L (ref 98–111)
Creatinine, Ser: 0.46 mg/dL (ref 0.44–1.00)
GFR calc Af Amer: >60 mL/min (ref >60)
GFR calc non Af Amer: >60 mL/min (ref >60)
Glucose, Bld: 129 mg/dL — ABNORMAL HIGH (ref 70–99)
Potassium: 4.4 mmol/L (ref 3.5–5.1)
Sodium: 136 mmol/L (ref 135–145)

## 2019-04-14 LAB — MAGNESIUM: Magnesium: 2.3 mg/dL (ref 1.7–2.4)

## 2019-04-14 MED ORDER — TRAVASOL 10 % IV SOLN
INTRAVENOUS | Status: AC
  Administered 2019-04-14: 18:00:00 via INTRAVENOUS
  Filled 2019-04-14: qty 990

## 2019-04-14 NOTE — Progress Notes (Signed)
PROGRESS NOTE    Molly Beck  QPY:[id number] DOB: 10/30/1966 DOA: 04/04/2019 PCP: Maude Leriche, [name]     Brief Narrative:   Molly Beck is a 53 y.o. female with medical history significant of GERD, elective surgery of her right foot a week ago presenting to the hospital for evaluation of abdominal pain.  Patient states she had a reconstructive right foot surgery done a week ago.  States since then she has been constipated due to taking pain medications.  Her last bowel movement was prior to the surgery.  For the past few days she is having left lower quadrant abdominal pain which became worse for the past 24 hours.  The pain is sharp and excruciating.  States today she called her surgeon's office and was advised to take magnesium citrate for constipation.  She vomited soon after taking magnesium citrate.  Denies any fevers.  She continues to complain of abdominal pain despite receiving a dose of morphine and multiple doses of fentanyl in the ED. CT abdomen pelvis showing acute sigmoid colitis with small foci of adjacent gas that may be extra luminal and may indicate microperforation.  No abscess or drainable fluid collection. She was admitted for further treatment and care.  General surgery was consulted.  New events last 24 hours / Subjective: Patient continues with significant nausea and very poor ability to tolerate full liquid diet.  Has not much of an appetite due to her symptoms.  Had a large bowel movement yesterday but continues with significant cramping abdominal pain.  Intra-abdominal drains continue to drain well.  Reports "overdid it with physical therapy yesterday".  Seen by general surgery this morning, with possible recommendation of laparotomy with colostomy next week if she does not progress through the weekend.  Continues on TPN.  Patient without any other complaints at this time.  Denies headache, no fever/chills/night sweats, no chest pain, palpitations, no shortness  of breath, no cough/congestion.  No acute events overnight per nursing staff.  Assessment & Plan:   Principal Problem:   Perforated sigmoid colon  Active Problems:   Colitis   Sepsis ([place])   Hypokalemia   H/O foot surgery   Sepsis secondary to acute sigmoid colitis with microperforation Sepsis, present on admission Patient presenting to ED with progressive abdominal pain.  CT abdomen/pelvis notable for acute sigmoid colitis with adjacent gas likely secondary to microperforation.  Patient was initially started on IV Zosyn.  During her initial hospitalization, her abdominal pain continued not to improve with resultant fevers and elevated white count.  Repeat CT abdomen/pelvis reveals marked worsening in the pelvis with 2 abscesses.  Interventional radiology was consulted and placed two drains on 04/10/2019.  --General surgery/IR following, appreciate assistance --Output past 24h: Right abdomen 50 mL's, buttock 60 mL's --Blood culture 04/05/2019 no growth x5 days --Wound culture 04/10/2019: Abundant mixed organisms, none predominant --Continue Zosyn --clear liquid diet per general surgery today  --PICC line placed, continues on TPN per general surgery --Continue to monitor drain output and BMs --Radiology plans on repeat CT scan within 1 week to evaluate drains --Consideration of laparotomy with colostomy next week per surgery if does not progress  Rash Maculopapular rash bilateral distribution on her back. Could be due to heat/sweat rash or contact from bedsheets.  She has tolerated IV Zosyn for the past week without any signs of allergic reaction.  This does not appear to be an reaction to Zosyn at this time.   --continue IV Benadryl and hydrocortisone cream prn.  --  continue hygiene and change of bed sheets daily  Recent right foot surgery Currently in a cast, plans to follow-up with her orthopedic surgeon as an outpatient --PT following --Continue nonweightbearing right lower  extremity  Hypokalemia Potassium 4.4 today.  Magnesium --Repeat electrolytes in a.m. to include magnesium   DVT prophylaxis: Lovenox Code Status: Full Family Communication: None Disposition Plan: Continue inpatient hospitalization, clear liquid diet, on TPN, IV antibiotics, further dependent on clinical course.  Possible laparotomy with colostomy next week.   Consultants:   General surgery  IR  Procedures:   IR drain placement x 2 04/10/2019  PICC RUE 04/10/2019  Antimicrobials:  Anti-infectives (From admission, onward)   Start     Dose/Rate Route Frequency Ordered Stop   04/05/19 0600  piperacillin-tazobactam (ZOSYN) IVPB 3.375 g     3.375 g 12.5 mL/hr over 240 Minutes Intravenous Every 8 hours 04/05/19 0121     04/05/19 0000  piperacillin-tazobactam (ZOSYN) IVPB 3.375 g     3.375 g 100 mL/hr over 30 Minutes Intravenous  Once 04/04/19 2355 04/05/19 0119       Objective: Vitals:   04/13/19 0435 04/13/19 1407 04/13/19 2102 04/14/19 0440  BP: 126/89 113/76 104/81 99/68  Pulse: (!) 107 (!) 106 (!) 127 (!) 109  Resp: 14 16 16 18   Temp: 98.5 F (36.9 C) 98.4 F (36.9 C) 99.9 F (37.7 C) 97.8 F (36.6 C)  TempSrc: Oral Oral Oral Oral  SpO2: 99% 99% 99% 97%  Weight:      Height:        Intake/Output Summary (Last 24 hours) at 04/14/2019 1120 Last data filed at 04/14/2019 1032 Gross per 24 hour  Intake 1764.32 ml  Output 150 ml  Net 1614.32 ml   Filed Weights   04/05/19 0953 04/10/19 1059  Weight: 79.9 kg 78.9 kg    Examination: General exam: Appears calm and comfortable  Respiratory system: Clear to auscultation. Respiratory effort normal. Cardiovascular system: S1 & S2 heard, RRR. No JVD, murmurs, rubs, gallops or clicks. No pedal edema. Gastrointestinal system: Abdomen is nondistended, soft and tender to palpation left lower quadrant and pelvic, bowel sounds present, drains noted Central nervous system: Alert and oriented. No focal neurological  deficits. Extremities: Symmetric 5 x 5 power. Skin: + Maculopapular rash overlying her back Psychiatry: Judgement and insight appear normal. Mood & affect appropriate.    Data Reviewed: I have personally reviewed following labs and imaging studies  CBC: Recent Labs  Lab 04/10/19 0456 04/11/19 0531 04/12/19 0424 04/13/19 0327 04/14/19 0311  WBC 10.4 9.2 9.0 8.1 9.8  NEUTROABS  --  6.7  --   --   --   HGB 9.3* 9.8* 9.4* 9.3* 9.4*  HCT 28.9* 29.4* 27.7* 28.0* 29.3*  MCV 93.5 90.7 90.5 91.2 94.2  PLT 300 318 344 360 407*   Basic Metabolic Panel: Recent Labs  Lab 04/10/19 0456 04/11/19 0531 04/12/19 0424 04/13/19 0327 04/14/19 0311  NA 135 136 136 135 136  K 3.0* 3.0* 3.2* 4.1 4.4  CL 109 108 100 103 101  CO2 14* 20* 26 25 26   GLUCOSE 66* 131* 116* 128* 129*  BUN <5* 5* 7 11 16   CREATININE 0.51 0.45 0.31* 0.39* 0.46  CALCIUM 7.6* 7.8* 7.5* 7.8* 8.2*  MG  --  1.7 2.1 2.2 2.3  PHOS  --  2.1* 3.0 4.0  --    GFR: Estimated Creatinine Clearance: 83.6 mL/min (by C-G formula based on SCr of 0.46 mg/dL). Liver Function Tests: Recent  Labs  Lab 04/11/19 0531 04/12/19 0424  AST 18 19  ALT 20 20  ALKPHOS 72 74  BILITOT 0.6 0.4  PROT 5.6* 5.7*  ALBUMIN 2.4* 2.4*   No results for input(s): LIPASE, AMYLASE in the last 168 hours. No results for input(s): AMMONIA in the last 168 hours. Coagulation Profile: Recent Labs  Lab 04/10/19 0456  INR 1.2   Cardiac Enzymes: No results for input(s): CKTOTAL, CKMB, CKMBINDEX, TROPONINI in the last 168 hours. BNP (last 3 results) No results for input(s): PROBNP in the last 8760 hours. HbA1C: No results for input(s): HGBA1C in the last 72 hours. CBG: Recent Labs  Lab 04/13/19 0805 04/13/19 1132 04/13/19 1657 04/13/19 2120 04/14/19 0656  GLUCAP 101* 106* 89 111* 125*   Lipid Profile: No results for input(s): CHOL, HDL, LDLCALC, TRIG, CHOLHDL, LDLDIRECT in the last 72 hours. Thyroid Function Tests: No results for  input(s): TSH, T4TOTAL, FREET4, T3FREE, THYROIDAB in the last 72 hours. Anemia Panel: No results for input(s): VITAMINB12, FOLATE, FERRITIN, TIBC, IRON, RETICCTPCT in the last 72 hours. Sepsis Labs: No results for input(s): PROCALCITON, LATICACIDVEN in the last 168 hours.  Recent Results (from the past 240 hour(s))  SARS Coronavirus 2 (CEPHEID - Performed in [facility] hospital lab), Hosp Order     Status: None   Collection Time: 04/05/19 12:14 AM   Specimen: Nasopharyngeal Swab  Result Value Ref Range Status   SARS Coronavirus 2 NEGATIVE NEGATIVE Final    Comment: (NOTE) If result is NEGATIVE SARS-CoV-2 target nucleic acids are NOT DETECTED. The SARS-CoV-2 RNA is generally detectable in upper and lower  respiratory specimens during the acute phase of infection. The lowest  concentration of SARS-CoV-2 viral copies this assay can detect is 250  copies / mL. A negative result does not preclude SARS-CoV-2 infection  and should not be used as the sole basis for treatment or other  patient management decisions.  A negative result may occur with  improper specimen collection / handling, submission of specimen other  than nasopharyngeal swab, presence of viral mutation(s) within the  areas targeted by this assay, and inadequate number of viral copies  (<250 copies / mL). A negative result must be combined with clinical  observations, patient history, and epidemiological information. If result is POSITIVE SARS-CoV-2 target nucleic acids are DETECTED. The SARS-CoV-2 RNA is generally detectable in upper and lower  respiratory specimens dur ing the acute phase of infection.  Positive  results are indicative of active infection with SARS-CoV-2.  Clinical  correlation with patient history and other diagnostic information is  necessary to determine patient infection status.  Positive results do  not rule out bacterial infection or co-infection with other viruses. If result is PRESUMPTIVE  POSTIVE SARS-CoV-2 nucleic acids MAY BE PRESENT.   A presumptive positive result was obtained on the submitted specimen  and confirmed on repeat testing.  While 2019 novel coronavirus  (SARS-CoV-2) nucleic acids may be present in the submitted sample  additional confirmatory testing may be necessary for epidemiological  and / or clinical management purposes  to differentiate between  SARS-CoV-2 and other Sarbecovirus currently known to infect humans.  If clinically indicated additional testing with an alternate test  methodology [phone]) is advised. The SARS-CoV-2 RNA is generally  detectable in upper and lower respiratory sp ecimens during the acute  phase of infection. The expected result is Negative. Fact Sheet for Patients:  BoilerBrush.com.cy Fact Sheet for Healthcare Providers: https://pope.com/ This test is not yet approved or cleared  by the [place] FDA and has been authorized for detection and/or diagnosis of SARS-CoV-2 by FDA under an Emergency Use Authorization (EUA).  This EUA will remain in effect (meaning this test can be used) for the duration of the COVID-19 declaration under Section 564(b)(1) of the Act, 21 U.S.C. section 360bbb-3(b)(1), unless the authorization is terminated or revoked sooner. Performed at [facility], [address] [place], [place] [address]   Culture, blood (routine x 2)     Status: None   Collection Time: 04/05/19  1:10 AM   Specimen: BLOOD RIGHT HAND  Result Value Ref Range Status   Specimen Description   Final    BLOOD RIGHT HAND Performed at [facility], [address] [place], [place] [address]    Special Requests   Final    BOTTLES DRAWN AEROBIC AND ANAEROBIC Blood Culture results may not be optimal due to an excessive volume of blood received in culture bottles Performed at [facility], [address] [place], [place] [address]     Culture   Final    NO GROWTH 5 DAYS Performed at [facility], [address] [place], [place] [address]    Report Status 04/10/2019 FINAL  Final  Culture, blood (routine x 2)     Status: None   Collection Time: 04/05/19  2:53 AM   Specimen: BLOOD  Result Value Ref Range Status   Specimen Description   Final    BLOOD RIGHT ANTECUBITAL Performed at [facility], [address] [place], [place] [address]    Special Requests   Final    BOTTLES DRAWN AEROBIC AND ANAEROBIC Blood Culture results may not be optimal due to an excessive volume of blood received in culture bottles Performed at [facility], [address] [place], [place] [address]    Culture   Final    NO GROWTH 5 DAYS Performed at [facility], [address] [place], [place] [address]    Report Status 04/10/2019 FINAL  Final  Aerobic/Anaerobic Culture (surgical/deep wound)     Status: None   Collection Time: 04/10/19  2:05 PM   Specimen: Abscess  Result Value Ref Range Status   Specimen Description   Final    ABSCESS DIVERTICULAR Performed at [facility], [address] [place], [place] [address]    Special Requests   Final    Normal Performed at [facility], [address] [place], [place] [address]    Gram Stain   Final    ABUNDANT WBC PRESENT, PREDOMINANTLY PMN ABUNDANT GRAM NEGATIVE RODS ABUNDANT GRAM POSITIVE COCCI FEW GRAM VARIABLE ROD Performed at [facility], [address] [place], [place] [address]    Culture   Final    ABUNDANT MULTIPLE ORGANISMS PRESENT, NONE PREDOMINANT ABUNDANT MIXED ANAEROBIC FLORA PRESENT.  CALL LAB IF FURTHER IID REQUIRED.    Report Status 04/13/2019 FINAL  Final  Aerobic/Anaerobic Culture (surgical/deep wound)     Status: None   Collection Time: 04/10/19  2:06 PM   Specimen: Abscess  Result Value Ref Range Status   Specimen Description   Final    ABSCESS  PELVIC Performed at [facility], [address] [place], [place] [address]    Special Requests   Final    Normal Performed at [facility], [address] [place], [place] [address]    Gram Stain  Final    ABUNDANT WBC PRESENT,BOTH PMN AND MONONUCLEAR ABUNDANT GRAM NEGATIVE RODS MODERATE GRAM POSITIVE COCCI MODERATE GRAM VARIABLE ROD Performed at [facility], [address] [place], [place] [address]    Culture   Final    ABUNDANT MULTIPLE ORGANISMS PRESENT, NONE PREDOMINANT ABUNDANT MIXED ANAEROBIC FLORA PRESENT.  CALL LAB IF FURTHER IID REQUIRED.    Report Status 04/13/2019 FINAL  Final       Radiology Studies: No results found.    Scheduled Meds:  enoxaparin (LOVENOX) injection  40 mg Subcutaneous Q24H   feeding supplement  1 Container Oral BID BM   fluticasone  1 spray Each Nare Daily   insulin aspart  0-9 Units Subcutaneous TID WC   pantoprazole  20 mg Oral BID   sodium chloride flush  10 mL Intracatheter Q8H   sodium chloride flush  10-40 mL Intracatheter Q12H   Continuous Infusions:  sodium chloride Stopped (04/05/19 1756)   sodium chloride 10 mL/hr at 04/12/19 2206   methocarbamol (ROBAXIN) IV Stopped (04/13/19 1347)   piperacillin-tazobactam (ZOSYN)  IV 3.375 g (04/14/19 0637)   TPN ADULT (ION) 75 mL/hr at 04/13/19 1727   TPN ADULT (ION)       LOS: 9 days    Time spent: 28 minutes   Alvira PhilipsEric J [place], DO [facility] [email or web address] 04/14/2019, 11:20 AM

## 2019-04-14 NOTE — Progress Notes (Signed)
Subjective/Chief Complaint: Patient with more nausea heartburn and pain.   Objective: Vital signs in last 24 hours: Temp:  [97.8 F (36.6 C)-99.9 F (37.7 C)] 97.8 F (36.6 C) (06/13 0440) Pulse Rate:  [106-127] 109 (06/13 0440) Resp:  [16-18] 18 (06/13 0440) BP: (99-113)/(68-81) 99/68 (06/13 0440) SpO2:  [97 %-99 %] 97 % (06/13 0440) Last BM Date: 04/12/19  Intake/Output from previous day: 06/12 0701 - 06/13 0700 In: 2049.3 [P.O.:600; I.V.:1203.7; IV Piggyback:245.6] Out: 140 [Drains:140] Intake/Output this shift: No intake/output data recorded.  General appearance: alert and cooperative GI: Tender lower abdomen.  No rebound or guarding though.  Drains in place.  Drainage appears serous now. Neurologic: Grossly normal  Lab Results:  Recent Labs    04/13/19 0327 04/14/19 0311  WBC 8.1 9.8  HGB 9.3* 9.4*  HCT 28.0* 29.3*  PLT 360 407*   BMET Recent Labs    04/13/19 0327 04/14/19 0311  NA 135 136  K 4.1 4.4  CL 103 101  CO2 25 26  GLUCOSE 128* 129*  BUN 11 16  CREATININE 0.39* 0.46  CALCIUM 7.8* 8.2*   PT/INR No results for input(s): LABPROT, INR in the last 72 hours. ABG No results for input(s): PHART, HCO3 in the last 72 hours.  Invalid input(s): PCO2, PO2  Studies/Results: No results found.  Anti-infectives: Anti-infectives (From admission, onward)   Start     Dose/Rate Route Frequency Ordered Stop   04/05/19 0600  piperacillin-tazobactam (ZOSYN) IVPB 3.375 g     3.375 g 12.5 mL/hr over 240 Minutes Intravenous Every 8 hours 04/05/19 0121     04/05/19 0000  piperacillin-tazobactam (ZOSYN) IVPB 3.375 g     3.375 g 100 mL/hr over 30 Minutes Intravenous  Once 04/04/19 2355 04/05/19 0119      Assessment/Plan:  GERD Osteoarthritis - takes meloxicam and tramadol at baseline Recent right ankle surgery by Dr. Hewitt5/26/2020 Malnutrition - prealbumin <5, on TPN  Sigmoiddiverticulitis with abscesses             - no prior history  ofdiverticulitis             - never had a colonoscopy before             - CT scan 6/3 showed acute sigmoid colitis with small foci of adjacent gasindicative ofmicro perforation, no abscess or drainable fluid collection             - CT 6/9 showed worsening sigmoid diverticulitis with 2 new abscesses             - s/p IR drain x2 placement 6/9, cultures pending Unfortunately, she does not appear to be improving despite drainage.  She will more than likely require laparotomy next week with colostomy.  I discussed this at length with her.  I discussed potential complications and long-term expectation as well as potential colostomy closure.  Continue with clears for right now.  She has significant malnutrition therefore continue TNA.  Hopefully she will improve over the next 48 hours but if not, laparotomy would be the next step which I discussed with her at length today.  ID -zosyn 6/4>> VTE -SCD,lovenox FEN -IVF, TPN, FLD Foley -none Follow up -TBD  Plan:Ok for full liquids but take diet slow. Continue IV zosyn and follow drain cultures. Mobilize.             IR planning for repeat CT within 1 week of drain placement.  LOS: 9 days    Joyice Faster  Jeshurun [place] 04/14/2019

## 2019-04-14 NOTE — Progress Notes (Signed)
PHARMACY - ADULT TOTAL PARENTERAL NUTRITION CONSULT NOTE   Pharmacy Consult for TPN Indication: intolerance to enteral feedings, diverticulitis  Patient Measurements: Height: 5\' 4"  (162.6 cm) Weight: 173 lb 15.1 oz (78.9 kg) IBW/kg (Calculated) : 54.7 TPN AdjBW (KG): 61 Body mass index is 29.86 kg/m.  Insulin Requirements: 1 units SSI last 24hr  Current Nutrition: advanced to CL starting 6/11 - [facility], but patient now with more nausea per 6/13 [facility] note  [facility]  Central access:  PICC to be placed 6/9 TPN start date: 6/9  ASSESSMENT                                                                                                          HPI: 53 yo female with sigmoid diverticulitis with abscesses, now worsening to have IR place drain today. Patient has been intolerant to enteral feedings for about a week now so plan to place PICC line and start TPN per [facility]CS orders today  Significant events:  6/13: per [facility], patient not improving much, will likely require laparotomy next with with colostomy  Today, 04/14/19  Glucose - CBGs at goal < 150, stable  Electrolytes - stable WNL  Renal - SCr 0.45 stable  LFTs - WNL  TGs - 91 (6/10)  Prealbumin - <5 (6/10)  NUTRITIONAL GOALS                                                                                             RD recs: Kcal/day: 2000-2240, protein/day: 95-105g  Custom TPN to provide 99g protein per day and 1915 kcal/day at goal rate of 75 ml/hr (96% of estimated needs per RD)  PLAN                                                                                                                         At 1800 today:  Continue custom TPN at goal rate of 75 ml/hr  TPN will contain standard electrolytes except: increase Na at 75, K at 80, and max acetate.   TPN to contain standard multivitamins daily and trace elements on MWF due to national shortage  Continue [facility] at [facility].  Continue CBG/SSI TID  with meals  TPN lab panels on Mondays & Thursdays.    Adrian Saran, PharmD, BCPS 04/14/2019 10:15 AM

## 2019-04-15 ENCOUNTER — Inpatient Hospital Stay (HOSPITAL_COMMUNITY): Payer: PRIVATE HEALTH INSURANCE

## 2019-04-15 LAB — BASIC METABOLIC PANEL
Anion gap: 10 (ref 5–15)
BUN: 18 mg/dL (ref 6–20)
CO2: 27 mmol/L (ref 22–32)
Calcium: 8.4 mg/dL — ABNORMAL LOW (ref 8.9–10.3)
Chloride: 102 mmol/L (ref 98–111)
Creatinine, Ser: 0.58 mg/dL (ref 0.44–1.00)
GFR calc Af Amer: >60 mL/min (ref >60)
GFR calc non Af Amer: >60 mL/min (ref >60)
Glucose, Bld: 119 mg/dL — ABNORMAL HIGH (ref 70–99)
Potassium: 3.9 mmol/L (ref 3.5–5.1)
Sodium: 139 mmol/L (ref 135–145)

## 2019-04-15 LAB — CBC
HCT: 30 % — ABNORMAL LOW (ref 36.0–46.0)
Hemoglobin: 9.8 g/dL — ABNORMAL LOW (ref 12.0–15.0)
MCH: 30.8 pg (ref 26.0–34.0)
MCHC: 32.7 g/dL (ref 30.0–36.0)
MCV: 94.3 fL (ref 80.0–100.0)
Platelets: 455 10*3/uL — ABNORMAL HIGH (ref 150–400)
RBC: 3.18 MIL/uL — ABNORMAL LOW (ref 3.87–5.11)
RDW: 13.9 % (ref 11.5–15.5)
WBC: 8.9 10*3/uL (ref 4.0–10.5)
nRBC: 0 % (ref 0.0–0.2)

## 2019-04-15 LAB — GLUCOSE, CAPILLARY
Glucose-Capillary: 110 mg/dL — ABNORMAL HIGH (ref 70–99)
Glucose-Capillary: 111 mg/dL — ABNORMAL HIGH (ref 70–99)
Glucose-Capillary: 97 mg/dL (ref 70–99)

## 2019-04-15 LAB — HEMOGLOBIN A1C
Hgb A1c MFr Bld: 4.9 % (ref 4.8–5.6)
Mean Plasma Glucose: 93.93 mg/dL

## 2019-04-15 MED ORDER — ENOXAPARIN SODIUM 40 MG/0.4ML ~~LOC~~ SOLN: 40.0000 mg | SUBCUTANEOUS | Status: AC

## 2019-04-15 MED ORDER — ENOXAPARIN SODIUM 40 MG/0.4ML ~~LOC~~ SOLN
40.0000 mg | SUBCUTANEOUS | Status: DC
  Administered 2019-04-17 – 2019-05-02 (×15): 40 mg via SUBCUTANEOUS
  Filled 2019-04-15 (×15): qty 0.4

## 2019-04-15 MED ORDER — SODIUM CHLORIDE 0.9 % IV SOLN
INTRAVENOUS | Status: DC
  Administered 2019-04-15 – 2019-04-17 (×5): via INTRAVENOUS

## 2019-04-15 MED ORDER — CHLORHEXIDINE GLUCONATE CLOTH 2 % EX PADS
6.0000 | MEDICATED_PAD | Freq: Once | CUTANEOUS | Status: AC
  Administered 2019-04-15: 6 via TOPICAL

## 2019-04-15 MED ORDER — SODIUM CHLORIDE 0.9 % IV SOLN
2.0000 g | INTRAVENOUS | Status: AC
  Filled 2019-04-15: qty 2

## 2019-04-15 MED ORDER — IOHEXOL 300 MG/ML  SOLN
100.0000 mL | Freq: Once | INTRAMUSCULAR | Status: AC | PRN
  Administered 2019-04-15: 100 mL via INTRAVENOUS

## 2019-04-15 MED ORDER — SODIUM CHLORIDE (PF) 0.9 % IJ SOLN
INTRAMUSCULAR | Status: AC
  Administered 2019-04-15: 13:00:00
  Filled 2019-04-15: qty 50

## 2019-04-15 MED ORDER — CHLORHEXIDINE GLUCONATE CLOTH 2 % EX PADS
6.0000 | MEDICATED_PAD | Freq: Once | CUTANEOUS | Status: AC
  Administered 2019-04-16: 6 via TOPICAL

## 2019-04-15 MED ORDER — TRAVASOL 10 % IV SOLN
INTRAVENOUS | Status: AC
  Administered 2019-04-15: 18:00:00 via INTRAVENOUS
  Filled 2019-04-15: qty 990

## 2019-04-15 NOTE — Progress Notes (Signed)
PROGRESS NOTE    Molly ReiningCarol T Turano  BJY:[id number] DOB: 03/16/1966 DOA: 04/04/2019 PCP: Clementeen GrahamScifres, [name], [name]     Brief Narrative:   Molly Beck is a 53 y.o. female with medical history significant of GERD, elective surgery of her right foot a week ago presenting to the hospital for evaluation of abdominal pain.  Patient states she had a reconstructive right foot surgery done a week ago.  States since then she has been constipated due to taking pain medications.  Her last bowel movement was prior to the surgery.  For the past few days she is having left lower quadrant abdominal pain which became worse for the past 24 hours.  The pain is sharp and excruciating.  States today she called her surgeon's office and was advised to take magnesium citrate for constipation.  She vomited soon after taking magnesium citrate.  Denies any fevers.  She continues to complain of abdominal pain despite receiving a dose of morphine and multiple doses of fentanyl in the ED. CT abdomen pelvis showing acute sigmoid colitis with small foci of adjacent gas that may be extra luminal and may indicate microperforation.  No abscess or drainable fluid collection. She was admitted for further treatment and care.  General surgery was consulted.  New events last 24 hours / Subjective: Patient continues with significant pelvic pain and intolerance with associated nausea with her clear liquid diet.  Poor appetite.  No bowel movement.  Continues with intra-abdominal drains with continued output.  Given her lack of progression and actual digression, general surgery likely plans laparotomy with colostomy this week. Continues on TPN.  Patient without any other complaints at this time.  Denies headache, no fever/chills/night sweats, no chest pain, palpitations, no shortness of breath, no cough/congestion.  No acute events overnight per nursing staff.  Assessment & Plan:   Principal Problem:   Perforated sigmoid colon  Active  Problems:   Colitis   Sepsis (HCC)   Hypokalemia   H/O foot surgery   Sepsis secondary to acute sigmoid colitis with microperforation Sepsis, present on admission Patient presenting to ED with progressive abdominal pain.  CT abdomen/pelvis notable for acute sigmoid colitis with adjacent gas likely secondary to microperforation.  Patient was initially started on IV Zosyn.  During her initial hospitalization, her abdominal pain continued not to improve with resultant fevers and elevated white count.  Repeat CT abdomen/pelvis reveals marked worsening in the pelvis with 2 abscesses.  Interventional radiology was consulted and placed two drains on 04/10/2019.  --General surgery/IR following, appreciate assistance --Output past 24h: Right abdomen 35 mL's, buttock 15 mL's --Blood culture 04/05/2019 no growth x5 days --Wound culture 04/10/2019: Abundant mixed organisms, none predominant --Continue Zosyn --clear liquid diet  --Start NS at 100 mL's per hour given intolerance to diet --TPN per general surgery --Continue to monitor drain output and BMs --Repeat CT abdomen/pelvis ordered by general surgery today --Likely laparotomy with colostomy next week per surgery  Rash Maculopapular rash bilateral distribution on her back. Could be due to heat/sweat rash or contact from bedsheets.  She has tolerated IV Zosyn for the past week without any signs of allergic reaction.  This does not appear to be an reaction to Zosyn at this time.   --continue IV Benadryl and hydrocortisone cream prn.  --continue hygiene and change of bed sheets daily  Recent right foot surgery Currently in a cast, plans to follow-up with her orthopedic surgeon as an outpatient --PT following --Continue nonweightbearing right lower extremity  Hypokalemia  Potassium 3.9 today.   --Repeat electrolytes in a.m. to include magnesium   DVT prophylaxis: Lovenox Code Status: Full Family Communication: None Disposition Plan: Continue  inpatient hospitalization, clear liquid diet, on TPN, IV antibiotics, further dependent on clinical course.  Repeat CT abdomen/pelvis today, possible laparotomy with colostomy next week.   Consultants:   General surgery  IR  Procedures:   IR drain placement x 2 04/10/2019  PICC RUE 04/10/2019  Antimicrobials:  Anti-infectives (From admission, onward)   Start     Dose/Rate Route Frequency Ordered Stop   04/05/19 0600  piperacillin-tazobactam (ZOSYN) IVPB 3.375 g     3.375 g 12.5 mL/hr over 240 Minutes Intravenous Every 8 hours 04/05/19 0121     04/05/19 0000  piperacillin-tazobactam (ZOSYN) IVPB 3.375 g     3.375 g 100 mL/hr over 30 Minutes Intravenous  Once 04/04/19 2355 04/05/19 0119       Objective: Vitals:   04/14/19 0440 04/14/19 1331 04/14/19 2245 04/15/19 0534  BP: 99/68 121/75 117/79 (!) 127/96  Pulse: (!) 109 96 (!) 102 (!) 123  Resp: 18 16 18 20   Temp: 97.8 F (36.6 C) 97.8 F (36.6 C) 98.3 F (36.8 C) 98.7 F (37.1 C)  TempSrc: Oral Oral Oral Oral  SpO2: 97% 99% 96% 95%  Weight:      Height:        Intake/Output Summary (Last 24 hours) at 04/15/2019 1035 Last data filed at 04/15/2019 0558 Gross per 24 hour  Intake 600 ml  Output 40 ml  Net 560 ml   Filed Weights   04/05/19 0953 04/10/19 1059  Weight: 79.9 kg 78.9 kg    Examination: General exam: Appears calm and comfortable  Respiratory system: Clear to auscultation. Respiratory effort normal. Cardiovascular system: S1 & S2 heard, RRR. No JVD, murmurs, rubs, gallops or clicks. No pedal edema. Gastrointestinal system: Abdomen is nondistended, soft and tender to palpation left lower quadrant and pelvic, bowel sounds present, drains noted Central nervous system: Alert and oriented. No focal neurological deficits. Extremities: Symmetric 5 x 5 power. Skin: + Maculopapular rash overlying her back Psychiatry: Judgement and insight appear normal. Mood & affect appropriate.    Data Reviewed: I have  personally reviewed following labs and imaging studies  CBC: Recent Labs  Lab 04/11/19 0531 04/12/19 0424 04/13/19 0327 04/14/19 0311 04/15/19 0321  WBC 9.2 9.0 8.1 9.8 8.9  NEUTROABS 6.7  --   --   --   --   HGB 9.8* 9.4* 9.3* 9.4* 9.8*  HCT 29.4* 27.7* 28.0* 29.3* 30.0*  MCV 90.7 90.5 91.2 94.2 94.3  PLT 318 344 360 407* 455*   Basic Metabolic Panel: Recent Labs  Lab 04/11/19 0531 04/12/19 0424 04/13/19 0327 04/14/19 0311 04/15/19 0321  NA 136 136 135 136 139  K 3.0* 3.2* 4.1 4.4 3.9  CL 108 100 103 101 102  CO2 20* 26 25 26 27   GLUCOSE 131* 116* 128* 129* 119*  BUN 5* 7 11 16 18   CREATININE 0.45 0.31* 0.39* 0.46 0.58  CALCIUM 7.8* 7.5* 7.8* 8.2* 8.4*  MG 1.7 2.1 2.2 2.3  --   PHOS 2.1* 3.0 4.0  --   --    GFR: Estimated Creatinine Clearance: 83.6 mL/min (by C-G formula based on SCr of 0.58 mg/dL). Liver Function Tests: Recent Labs  Lab 04/11/19 0531 04/12/19 0424  AST 18 19  ALT 20 20  ALKPHOS 72 74  BILITOT 0.6 0.4  PROT 5.6* 5.7*  ALBUMIN 2.4* 2.4*  No results for input(s): LIPASE, AMYLASE in the last 168 hours. No results for input(s): AMMONIA in the last 168 hours. Coagulation Profile: Recent Labs  Lab 04/10/19 0456  INR 1.2   Cardiac Enzymes: No results for input(s): CKTOTAL, CKMB, CKMBINDEX, TROPONINI in the last 168 hours. BNP (last 3 results) No results for input(s): PROBNP in the last 8760 hours. HbA1C: No results for input(s): HGBA1C in the last 72 hours. CBG: Recent Labs  Lab 04/13/19 2120 04/14/19 0656 04/14/19 1150 04/14/19 1659 04/15/19 0724  GLUCAP 111* 125* 107* 83 110*   Lipid Profile: No results for input(s): CHOL, HDL, LDLCALC, TRIG, CHOLHDL, LDLDIRECT in the last 72 hours. Thyroid Function Tests: No results for input(s): TSH, T4TOTAL, FREET4, T3FREE, THYROIDAB in the last 72 hours. Anemia Panel: No results for input(s): VITAMINB12, FOLATE, FERRITIN, TIBC, IRON, RETICCTPCT in the last 72 hours. Sepsis Labs: No  results for input(s): PROCALCITON, LATICACIDVEN in the last 168 hours.  Recent Results (from the past 240 hour(s))  Aerobic/Anaerobic Culture (surgical/deep wound)     Status: None   Collection Time: 04/10/19  2:05 PM   Specimen: Abscess  Result Value Ref Range Status   Specimen Description   Final    ABSCESS DIVERTICULAR Performed at [facility], [address] [place], [place] [address]    Special Requests   Final    Normal Performed at [facility], [address] [place], [place] [address]    Gram Stain   Final    ABUNDANT WBC PRESENT, PREDOMINANTLY PMN ABUNDANT GRAM NEGATIVE RODS ABUNDANT GRAM POSITIVE COCCI FEW GRAM VARIABLE ROD Performed at [facility], [address] [place], [place] [address]    Culture   Final    ABUNDANT MULTIPLE ORGANISMS PRESENT, NONE PREDOMINANT ABUNDANT MIXED ANAEROBIC FLORA PRESENT.  CALL LAB IF FURTHER IID REQUIRED.    Report Status 04/13/2019 FINAL  Final  Aerobic/Anaerobic Culture (surgical/deep wound)     Status: None   Collection Time: 04/10/19  2:06 PM   Specimen: Abscess  Result Value Ref Range Status   Specimen Description   Final    ABSCESS PELVIC Performed at [facility], [address] [place], [place] [address]    Special Requests   Final    Normal Performed at [facility], [address] [place], [place] [address]    Gram Stain   Final    ABUNDANT WBC PRESENT,BOTH PMN AND MONONUCLEAR ABUNDANT GRAM NEGATIVE RODS MODERATE GRAM POSITIVE COCCI MODERATE GRAM VARIABLE ROD Performed at [facility], [address] [place], [place] [address]    Culture   Final    ABUNDANT MULTIPLE ORGANISMS PRESENT, NONE PREDOMINANT ABUNDANT MIXED ANAEROBIC FLORA PRESENT.  CALL LAB IF FURTHER IID REQUIRED.    Report Status 04/13/2019 FINAL  Final       Radiology Studies: No results found.    Scheduled Meds: . Chlorhexidine Gluconate Cloth   6 each Topical Once   And  . Chlorhexidine Gluconate Cloth  6 each Topical Once  . enoxaparin (LOVENOX) injection  40 mg Subcutaneous Q24H  . enoxaparin (LOVENOX) injection  40 mg Subcutaneous Once  . feeding supplement  1 Container Oral BID BM  . fluticasone  1 spray Each Nare Daily  . insulin aspart  0-9 Units Subcutaneous TID WC  . pantoprazole  20 mg Oral BID  . sodium chloride flush  10 mL Intracatheter Q8H  . sodium chloride flush  10-40 mL  Intracatheter Q12H   Continuous Infusions: . sodium chloride Stopped (04/05/19 1756)  . sodium chloride 10 mL/hr at 04/12/19 2206  . sodium chloride 100 mL/hr at 04/15/19 0919  . methocarbamol (ROBAXIN) IV Stopped (04/13/19 1347)  . piperacillin-tazobactam (ZOSYN)  IV 3.375 g (04/15/19 0545)  . TPN ADULT (ION) 75 mL/hr at 04/14/19 1738  . TPN ADULT (ION)       LOS: 10 days    Time spent: 29 minutes   Eric J [place] DO [facility] [email or web address] 04/15/2019, 10:35 AM

## 2019-04-15 NOTE — Plan of Care (Signed)
  Problem: Health Behavior/Discharge Planning: Goal: Ability to manage health-related needs will improve Outcome: Progressing   Problem: Clinical Measurements: Goal: Ability to maintain clinical measurements within normal limits will improve Outcome: Progressing Goal: Will remain free from infection Outcome: Progressing Goal: Diagnostic test results will improve Outcome: Progressing Goal: Respiratory complications will improve Outcome: Progressing Goal: Cardiovascular complication will be avoided Outcome: Progressing   Problem: Activity: Goal: Risk for activity intolerance will decrease Outcome: Progressing   Problem: Nutrition: Goal: Adequate nutrition will be maintained Outcome: Progressing   Problem: Elimination: Goal: Will not experience complications related to bowel motility Outcome: Progressing Goal: Will not experience complications related to urinary retention Outcome: Progressing   Problem: Pain Managment: Goal: General experience of comfort will improve Outcome: Progressing   

## 2019-04-15 NOTE — Progress Notes (Signed)
Subjective/Chief Complaint: Patient with continued abdominal pain and feels worse.  Pain medicine not as effective as it was before.  Patient describes the pain is in her lower pelvis and does not feel like she is improving at this point time.   Objective: Vital signs in last 24 hours: Temp:  [97.8 F (36.6 C)-98.7 F (37.1 C)] 98.7 F (37.1 C) (06/14 0534) Pulse Rate:  [96-123] 123 (06/14 0534) Resp:  [16-20] 20 (06/14 0534) BP: (117-127)/(75-96) 127/96 (06/14 0534) SpO2:  [95 %-99 %] 95 % (06/14 0534) Last BM Date: 04/12/19  Intake/Output from previous day: 06/13 0701 - 06/14 0700 In: 720 [P.O.:660] Out: 50 [Drains:50] Intake/Output this shift: No intake/output data recorded.  General appearance: fatigued GI: Tender bilateral lower quadrants without peritonitis Neurologic: Grossly normal  Lab Results:  Recent Labs    04/14/19 0311 04/15/19 0321  WBC 9.8 8.9  HGB 9.4* 9.8*  HCT 29.3* 30.0*  PLT 407* 455*   BMET Recent Labs    04/14/19 0311 04/15/19 0321  NA 136 139  K 4.4 3.9  CL 101 102  CO2 26 27  GLUCOSE 129* 119*  BUN 16 18  CREATININE 0.46 0.58  CALCIUM 8.2* 8.4*   PT/INR No results for input(s): LABPROT, INR in the last 72 hours. ABG No results for input(s): PHART, HCO3 in the last 72 hours.  Invalid input(s): PCO2, PO2  Studies/Results: No results found.  Anti-infectives: Anti-infectives (From admission, onward)   Start     Dose/Rate Route Frequency Ordered Stop   04/05/19 0600  piperacillin-tazobactam (ZOSYN) IVPB 3.375 g     3.375 g 12.5 mL/hr over 240 Minutes Intravenous Every 8 hours 04/05/19 0121     04/05/19 0000  piperacillin-tazobactam (ZOSYN) IVPB 3.375 g     3.375 g 100 mL/hr over 30 Minutes Intravenous  Once 04/04/19 2355 04/05/19 0119      Assessment/Plan: GERD Osteoarthritis - takes meloxicam and tramadol at baseline Recent right ankle surgery by Dr. Hewitt5/26/2020 Malnutrition - prealbumin <5, on  TPN  Sigmoiddiverticulitis with abscesses - no prior history ofdiverticulitis - never had a colonoscopy before - CT scan 6/3 showed acute sigmoid colitis with small foci of adjacent gasindicative ofmicro perforation, no abscess or drainable fluid collection - CT 6/9 showed worsening sigmoid diverticulitis with 2 new abscesses - s/p IR drain x2 placement 6/9, culturespending Unfortunately, she does not appear to be improving despite drainage.  She will more than likely require laparotomy next week with colostomy.  I discussed this at length with her.  I discussed potential complications and long-term expectation as well as potential colostomy closure.  Continue with clears for right now.  She has significant malnutrition therefore continue TNA.  Hopefully she will improve over the next 48 hours but if not, laparotomy would be the next step which I discussed with her at length today.  ID -zosyn 6/4>> VTE -SCD,lovenox FEN -IVF, TPN,FLD Foley -none Follow up -TBD   She is not improving with medical management.  This been maximized.  I discussed with her surgical intervention and Dr. Maisie Fushomas is here this week 1 of her colorectal surgeons.  We will go ahead and order CT scan for today for follow-up of drainage.  Since she is clinically not improving I have recommended sigmoid colectomy with colostomy or some form of diversion and then subsequent takedown of this down the road.  She is in agreement to proceed.  Risk and benefits of the operation were discussed as well as long-term expectations,  wound care, ostomy care and supplies that are needed.   LOS: 10 days    Joyice Faster Molly Beck 04/15/2019

## 2019-04-15 NOTE — Progress Notes (Signed)
PHARMACY - ADULT TOTAL PARENTERAL NUTRITION CONSULT NOTE   Pharmacy Consult for TPN Indication: intolerance to enteral feedings, diverticulitis  Patient Measurements: Height: 5\' 4"  (162.6 cm) Weight: 173 lb 15.1 oz (78.9 kg) IBW/kg (Calculated) : 54.7 TPN AdjBW (KG): 61 Body mass index is 29.86 kg/m.  Insulin Requirements: 1 units SSI last 24hr  Current Nutrition: advanced to CL starting 6/11 - advance to [facility] per CCS on 6/12, but patient now with more nausea per 6/13 [facility] note - back to CL diet 6/13 and no documentation of eating since  [facility]F: [facility] at [facility] access:  PICC to be placed 6/9 TPN start date: 6/9  ASSESSMENT                                                                                                          HPI: 53 yo female with sigmoid diverticulitis with abscesses, now worsening to have IR place drain today. Patient has been intolerant to enteral feedings for about a week now so plan to place PICC line and start TPN per [facility]CS orders today  Significant events:  6/13: per [facility], patient not improving much, will likely require laparotomy next with with colostomy 6/14: per [facility], pt reports increased pelvic pain and feels worse, repeat CT today for follow up for drainage  Today, 04/15/19  Glucose - CBGs at goal < 150, stable  Electrolytes - stable WNL  Renal - SCr 0.45 stable  LFTs - WNL  TGs - 91 (6/10)  Prealbumin - <5 (6/10)  NUTRITIONAL GOALS                                                                                             RD recs: Kcal/day: 2000-2240, protein/day: 95-105g  Custom TPN to provide 99g protein per day and 1915 kcal/day at goal rate of 75 ml/hr (96% of estimated needs per RD)  PLAN                                                                                                                         At 1800 today:  Continue custom TPN at goal rate of 75 ml/hr  TPN will  contain standard electrolytes except: increase Na at 75, K  at 80, and max acetate.   TPN to contain standard multivitamins daily and trace elements on MWF due to national shortage  [facility]  Continue CBG/SSI TID with meals  TPN lab panels on Mondays & Thursdays.    Hessie KnowsJustin M Sherie Dobrowolski, PharmD, BCPS 04/15/2019 10:00 AM

## 2019-04-15 NOTE — Progress Notes (Signed)
Referring Physician(s): Gross,S  Supervising Physician: Ruel FavorsShick, Trevor  Patient Status:  [name]  Chief Complaint: Abdominal/pelvic pain,diverticular abscesses   Subjective: Pt continues to have abd/pelvic discomfort, nausea; no fevers/chills   Allergies: Codeine  Medications: Prior to Admission medications   Medication Sig Start Date End Date Taking? Authorizing Provider  acetaminophen (TYLENOL) 325 MG tablet Take 650 mg by mouth every 6 (six) hours as needed (for back pain/pain.).   Yes [provider]  ALPRAZolam (XANAX) 0.25 MG tablet Take 0.25 mg by mouth 2 (two) times daily as needed for anxiety.   Yes [provider]  ASPIRIN ADULT LOW STRENGTH 81 MG EC tablet Take 81 mg by mouth daily. 03/27/19  Yes [provider]  cetirizine (ZYRTEC) 10 MG tablet Take 10 mg by mouth daily.   Yes [provider]  eletriptan (RELPAX) 40 MG tablet Take 40 mg by mouth as needed for migraine or headache. May repeat in 2 hours if headache persists or recurs.   Yes [provider]  fluticasone (FLONASE) 50 MCG/ACT nasal spray Place 1 spray into both nostrils daily.   Yes [provider]  Homeopathic Products [facility](SIMILASAN ALLERGY EYE RELIEF OP) Apply to eye.   Yes [provider]  ibuprofen (ADVIL,MOTRIN) 200 MG tablet Take 600 mg by mouth 2 (two) times daily as needed (for pain.).   Yes [provider]  LACTASE PO Take 5 mg by mouth daily as needed (for milk consumption).   Yes [provider]  meloxicam (MOBIC) 15 MG tablet Take 15 mg by mouth daily as needed for muscle spasms. 03/14/19  Yes [provider]  montelukast (SINGULAIR) 10 MG tablet Take 10 mg by mouth daily at 2 PM. 04/11/17  Yes [provider]  ondansetron (ZOFRAN) 4 MG tablet Take 4 mg by mouth daily as needed for nausea. 03/27/19  Yes [provider]  traMADol (ULTRAM) 50 MG tablet Take 25-50 mg by mouth 3 (three) times  daily as needed (for back pain.).   Yes [provider]  valACYclovir (VALTREX) 500 MG tablet Take 500 mg by mouth 2 (two) times daily as needed. For cold sores 03/24/17  Yes [provider]  oxyCODONE (OXY IR/ROXICODONE) 5 MG immediate release tablet Take 5 mg by mouth every 4 (four) hours as needed for pain. 03/27/19   [provider]     Vital Signs: BP (!) 127/96 (BP Location: Left Arm)   Pulse (!) 123   Temp 98.7 F (37.1 C) (Oral)   Resp 20   Ht 5\' 4"  (1.626 m)   Wt 173 lb 15.1 oz (78.9 kg)   SpO2 95%   BMI 29.86 kg/m   Physical Exam : awake/alert; central pelvic/rt TG drains intact; dressings dry, sites mildly tender; outputs 35/15 cc feculent appearing fluid  Imaging: No results found.  Labs:  CBC: Recent Labs    04/12/19 0424 04/13/19 0327 04/14/19 0311 04/15/19 0321  WBC 9.0 8.1 9.8 8.9  HGB 9.4* 9.3* 9.4* 9.8*  HCT 27.7* 28.0* 29.3* 30.0*  PLT 344 360 407* 455*    COAGS: Recent Labs    04/10/19 0456  INR 1.2    BMP: Recent Labs    04/12/19 0424 04/13/19 0327 04/14/19 0311 04/15/19 0321  NA 136 135 136 139  K 3.2* 4.1 4.4 3.9  CL 100 103 101 102  CO2 26 25 26 27   GLUCOSE 116* 128* 129* 119*  BUN 7 11 16 18   CALCIUM  7.5* 7.8* 8.2* 8.4*  CREATININE 0.31* 0.39* 0.46 0.58  GFRNONAA >60 >60 >60 >60  GFRAA >60 >60 >60 >60    LIVER FUNCTION TESTS: Recent Labs    04/04/19 2100 04/11/19 0531 04/12/19 0424  BILITOT 0.8 0.6 0.4  AST 20 18 19   ALT 18 20 20   ALKPHOS 101 72 74  PROT 7.7 5.6* 5.7*  ALBUMIN 4.2 2.4* 2.4*    Assessment and Plan: Patient with history of ruptured sigmoid diverticulitis with associated abscesses, status post drain placements x2 on6/9/20; afebrile; WBC nl;hgb stable; creat nl; will plan f/u CT for 6/15   Electronically Signed: D. Rowe Robert, [name] 04/15/2019, 8:55 AM   I spent a total of 15 minutes at the the patient's bedside AND on the patient's hospital floor or unit, greater  than 50% of which was counseling/coordinating care for abd/pelvic abscess drains    Patient ID: Molly Beck, female   DOB: Mar 26, 1966, 53 y.o.   MRN: [id number]

## 2019-04-16 ENCOUNTER — Inpatient Hospital Stay (HOSPITAL_COMMUNITY): Payer: PRIVATE HEALTH INSURANCE

## 2019-04-16 LAB — COMPREHENSIVE METABOLIC PANEL
ALT: 27 U/L (ref 0–44)
AST: 18 U/L (ref 15–41)
Albumin: 2.4 g/dL — ABNORMAL LOW (ref 3.5–5.0)
Alkaline Phosphatase: 72 U/L (ref 38–126)
Anion gap: 7 (ref 5–15)
BUN: 19 mg/dL (ref 6–20)
CO2: 28 mmol/L (ref 22–32)
Calcium: 8.3 mg/dL — ABNORMAL LOW (ref 8.9–10.3)
Chloride: 105 mmol/L (ref 98–111)
Creatinine, Ser: 0.53 mg/dL (ref 0.44–1.00)
GFR calc Af Amer: >60 mL/min (ref >60)
GFR calc non Af Amer: >60 mL/min (ref >60)
Glucose, Bld: 134 mg/dL — ABNORMAL HIGH (ref 70–99)
Potassium: 4.1 mmol/L (ref 3.5–5.1)
Sodium: 140 mmol/L (ref 135–145)
Total Bilirubin: 0.3 mg/dL (ref 0.3–1.2)
Total Protein: 6.1 g/dL — ABNORMAL LOW (ref 6.5–8.1)

## 2019-04-16 LAB — TRIGLYCERIDES: Triglycerides: 54 mg/dL (ref <150)

## 2019-04-16 LAB — CBC
HCT: 29.7 % — ABNORMAL LOW (ref 36.0–46.0)
Hemoglobin: 9.4 g/dL — ABNORMAL LOW (ref 12.0–15.0)
MCH: 30.1 pg (ref 26.0–34.0)
MCHC: 31.6 g/dL (ref 30.0–36.0)
MCV: 95.2 fL (ref 80.0–100.0)
Platelets: 480 10*3/uL — ABNORMAL HIGH (ref 150–400)
RBC: 3.12 MIL/uL — ABNORMAL LOW (ref 3.87–5.11)
RDW: 14.1 % (ref 11.5–15.5)
WBC: 6.8 10*3/uL (ref 4.0–10.5)
nRBC: 0 % (ref 0.0–0.2)

## 2019-04-16 LAB — DIFFERENTIAL
Abs Immature Granulocytes: 0.03 10*3/uL (ref 0.00–0.07)
Basophils Absolute: 0 10*3/uL (ref 0.0–0.1)
Basophils Relative: 0 %
Eosinophils Absolute: 0.2 10*3/uL (ref 0.0–0.5)
Eosinophils Relative: 3 %
Immature Granulocytes: 0 %
Lymphocytes Relative: 11 %
Lymphs Abs: 0.8 10*3/uL (ref 0.7–4.0)
Monocytes Absolute: 0.5 10*3/uL (ref 0.1–1.0)
Monocytes Relative: 8 %
Neutro Abs: 5.2 10*3/uL (ref 1.7–7.7)
Neutrophils Relative %: 78 %

## 2019-04-16 LAB — GLUCOSE, CAPILLARY
Glucose-Capillary: 116 mg/dL — ABNORMAL HIGH (ref 70–99)
Glucose-Capillary: 119 mg/dL — ABNORMAL HIGH (ref 70–99)
Glucose-Capillary: 109 mg/dL — ABNORMAL HIGH (ref 70–99)

## 2019-04-16 LAB — MAGNESIUM: Magnesium: 2.2 mg/dL (ref 1.7–2.4)

## 2019-04-16 LAB — PREALBUMIN: Prealbumin: 8.3 mg/dL — ABNORMAL LOW (ref 18–38)

## 2019-04-16 LAB — PHOSPHORUS: Phosphorus: 4 mg/dL (ref 2.5–4.6)

## 2019-04-16 MED ORDER — DIATRIZOATE MEGLUMINE & SODIUM 66-10 % PO SOLN
90.0000 mL | Freq: Once | ORAL | Status: AC
  Administered 2019-04-16: 90 mL via NASOGASTRIC
  Filled 2019-04-16: qty 90

## 2019-04-16 MED ORDER — LORAZEPAM 2 MG/ML IJ SOLN
0.5000 mg | Freq: Once | INTRAMUSCULAR | Status: AC
  Administered 2019-04-16: 0.5 mg via INTRAVENOUS
  Filled 2019-04-16: qty 1

## 2019-04-16 MED ORDER — TRAVASOL 10 % IV SOLN
INTRAVENOUS | Status: AC
  Administered 2019-04-16: 18:00:00 via INTRAVENOUS
  Filled 2019-04-16: qty 990

## 2019-04-16 MED ORDER — MENTHOL 3 MG MT LOZG
1.0000 | LOZENGE | OROMUCOSAL | Status: DC | PRN
  Administered 2019-04-19: 3 mg via ORAL
  Filled 2019-04-16 (×3): qty 9

## 2019-04-16 MED ORDER — PANTOPRAZOLE SODIUM 40 MG IV SOLR
40.0000 mg | Freq: Every day | INTRAVENOUS | Status: DC
  Administered 2019-04-16 – 2019-04-29 (×14): 40 mg via INTRAVENOUS
  Filled 2019-04-16 (×14): qty 40

## 2019-04-16 MED ORDER — LIDOCAINE HCL URETHRAL/MUCOSAL 2 % EX GEL
1.0000 "application " | Freq: Once | CUTANEOUS | Status: AC
  Administered 2019-04-16: 1
  Filled 2019-04-16: qty 5

## 2019-04-16 MED ORDER — PHENOL 1.4 % MT LIQD
1.0000 | OROMUCOSAL | Status: DC | PRN
  Administered 2019-04-16: 1 via OROMUCOSAL
  Filled 2019-04-16 (×2): qty 177

## 2019-04-16 NOTE — Progress Notes (Signed)
PROGRESS NOTE    Molly Beck  OZD:[id number] DOB: Jul 21, 1966 DOA: 04/04/2019 PCP: Maude Leriche, [name]     Brief Narrative:   Molly Beck is a 53 y.o. female with medical history significant of GERD, elective surgery of her right foot a week ago presenting to the hospital for evaluation of abdominal pain.  Patient states she had a reconstructive right foot surgery done a week ago.  States since then she has been constipated due to taking pain medications.  Her last bowel movement was prior to the surgery.  For the past few days she is having left lower quadrant abdominal pain which became worse for the past 24 hours.  The pain is sharp and excruciating.  States today she called her surgeon's office and was advised to take magnesium citrate for constipation.  She vomited soon after taking magnesium citrate.  Denies any fevers.  She continues to complain of abdominal pain despite receiving a dose of morphine and multiple doses of fentanyl in the ED. CT abdomen pelvis showing acute sigmoid colitis with small foci of adjacent gas that may be extra luminal and may indicate microperforation.  No abscess or drainable fluid collection. She was admitted for further treatment and care.  General surgery was consulted.  New events last 24 hours / Subjective: Patient continues with pelvic discomfort and tolerance associated with nausea with clear liquid diet.  No bowel movements.  CT abdomen/pelvis yesterday notable for improvement in her intra-abdominal abscesses, although with persistent small bowel obstruction.  Intra-abdominal drains in place with continued output.  General surgery today recommends NG tube placement for bowel obstruction for the next 48 hours to see if it will resolve, if not will require surgery. Continues on TPN.  Patient without any other complaints at this time.  Denies headache, no fever/chills/night sweats, no chest pain, palpitations, no shortness of breath, no  cough/congestion.  No acute events overnight per nursing staff.  Assessment & Plan:   Principal Problem:   Perforated sigmoid colon  Active Problems:   Colitis   Sepsis ([place])   Hypokalemia   H/O foot surgery   Sepsis secondary to acute sigmoid colitis with microperforation Sepsis, present on admission Patient presenting to ED with progressive abdominal pain.  CT abdomen/pelvis notable for acute sigmoid colitis with adjacent gas likely secondary to microperforation.  Patient was initially started on IV Zosyn.  During her initial hospitalization, her abdominal pain continued not to improve with resultant fevers and elevated white count.  Repeat CT abdomen/pelvis reveals marked worsening in the pelvis with 2 abscesses.  Interventional radiology was consulted and placed two drains on 04/10/2019.  --General surgery/IR following, appreciate assistance --Output past 24h: Right abdomen 10 mL's, buttock 30 mL's --Blood culture 04/05/2019 no growth x5 days --Wound culture 04/10/2019: Abundant mixed organisms, none predominant --Continue Zosyn --NGT placement today per general surgery --TPN, NS at 100 mL/hr given intolerance to diet --Continue to monitor drain output and BMs --Likely laparotomy with colostomy next 48 hours if no improvement with bowel decompression  Rash Maculopapular rash bilateral distribution on her back. Could be due to heat/sweat rash or contact from bedsheets.  She has tolerated IV Zosyn for the past week without any signs of allergic reaction.  This does not appear to be an reaction to Zosyn at this time.   --continue IV Benadryl and hydrocortisone cream prn.  --continue hygiene and change of bed sheets daily  Recent right foot surgery Currently in a cast, plans to follow-up with her  orthopedic surgeon as an outpatient --PT following --Continue nonweightbearing right lower extremity  Hypokalemia Potassium 4.1 today.   --Repeat electrolytes in a.m. to include  magnesium   DVT prophylaxis: Lovenox Code Status: Full Family Communication: None Disposition Plan: Continue inpatient hospitalization, on TPN, IV antibiotics, NGT today for bowel decompression, possible laparotomy with colostomy next 48h if no resolution with SBO.   Consultants:   General surgery  IR  Procedures:   IR drain placement x 2 04/10/2019  PICC RUE 04/10/2019  Antimicrobials:  Anti-infectives (From admission, onward)   Start     Dose/Rate Route Frequency Ordered Stop   04/16/19 0600  cefoTEtan (CEFOTAN) 2 g in sodium chloride 0.9 % 100 mL IVPB     2 g 200 mL/hr over 30 Minutes Intravenous On call to O.R. 04/15/19 1505 04/17/19 0559   04/05/19 0600  piperacillin-tazobactam (ZOSYN) IVPB 3.375 g     3.375 g 12.5 mL/hr over 240 Minutes Intravenous Every 8 hours 04/05/19 0121     04/05/19 0000  piperacillin-tazobactam (ZOSYN) IVPB 3.375 g     3.375 g 100 mL/hr over 30 Minutes Intravenous  Once 04/04/19 2355 04/05/19 0119       Objective: Vitals:   04/15/19 0534 04/15/19 1752 04/15/19 2258 04/16/19 0600  BP: (!) 127/96 99/87 114/84 127/77  Pulse: (!) 123 (!) 102 (!) 104 (!) 103  Resp: Temp: 98.7 F (37.1 C) 98.2 F (36.8 C) 98.6 F (37 C) 98.6 F (37 C)  TempSrc: Oral Oral Oral Oral  SpO2: 95% 97% 96% 97%  Weight:      Height:        Intake/Output Summary (Last 24 hours) at 04/16/2019 1042 Last data filed at 04/16/2019 0914 Gross per 24 hour  Intake 2576.42 ml  Output 70 ml  Net 2506.42 ml   Filed Weights   04/05/19 0953 04/10/19 1059  Weight: 79.9 kg 78.9 kg    Examination: General exam: Appears calm and comfortable  Respiratory system: Clear to auscultation. Respiratory effort normal. Cardiovascular system: S1 & S2 heard, RRR. No JVD, murmurs, rubs, gallops or clicks. No pedal edema. Gastrointestinal system: Abdomen is nondistended, soft and tender to palpation left lower quadrant and pelvic, bowel sounds present, drains  noted Central nervous system: Alert and oriented. No focal neurological deficits. Extremities: Symmetric 5 x 5 power. Skin: No current rashes, lesions or ulcerations Psychiatry: Judgement and insight appear normal. Mood & affect appropriate.    Data Reviewed: I have personally reviewed following labs and imaging studies  CBC: Recent Labs  Lab 04/11/19 0531 04/12/19 0424 04/13/19 0327 04/14/19 0311 04/15/19 0321 04/16/19 0323  WBC 9.2 9.0 8.1 9.8 8.9 6.8  NEUTROABS 6.7  --   --   --   --  5.2  HGB 9.8* 9.4* 9.3* 9.4* 9.8* 9.4*  HCT 29.4* 27.7* 28.0* 29.3* 30.0* 29.7*  MCV 90.7 90.5 91.2 94.2 94.3 95.2  PLT 318 344 360 407* 455* 480*   Basic Metabolic Panel: Recent Labs  Lab 04/11/19 0531 04/12/19 0424 04/13/19 0327 04/14/19 0311 04/15/19 0321 04/16/19 0323  NA 136 136 135 136 139 140  K 3.0* 3.2* 4.1 4.4 3.9 4.1  CL 108 100 103 101 102 105  CO2 20* GLUCOSE 131* 116* 128* 129* 119* 134*  BUN 5* CREATININE 0.45 0.31* 0.39* 0.46 0.58 0.53  CALCIUM 7.8* 7.5* 7.8* 8.2* 8.4* 8.3*  MG 1.7 2.1 2.2 2.3  --  2.2  PHOS 2.1* 3.0 4.0  --   --  4.0   GFR: Estimated Creatinine Clearance: 83.6 mL/min (by C-G formula based on SCr of 0.53 mg/dL). Liver Function Tests: Recent Labs  Lab 04/11/19 0531 04/12/19 0424 04/16/19 0323  AST 18 19 18   ALT 20 20 27   ALKPHOS 72 74 72  BILITOT 0.6 0.4 0.3  PROT 5.6* 5.7* 6.1*  ALBUMIN 2.4* 2.4* 2.4*   No results for input(s): LIPASE, AMYLASE in the last 168 hours. No results for input(s): AMMONIA in the last 168 hours. Coagulation Profile: Recent Labs  Lab 04/10/19 0456  INR 1.2   Cardiac Enzymes: No results for input(s): CKTOTAL, CKMB, CKMBINDEX, TROPONINI in the last 168 hours. BNP (last 3 results) No results for input(s): PROBNP in the last 8760 hours. HbA1C: Recent Labs    04/15/19 0321  HGBA1C 4.9   CBG: Recent Labs  Lab 04/14/19 1659 04/15/19 0724 04/15/19 1149 04/15/19 1614  04/16/19 0745  GLUCAP 83 110* 111* 97 116*   Lipid Profile: Recent Labs    04/16/19 0323  TRIG 54   Thyroid Function Tests: No results for input(s): TSH, T4TOTAL, FREET4, T3FREE, THYROIDAB in the last 72 hours. Anemia Panel: No results for input(s): VITAMINB12, FOLATE, FERRITIN, TIBC, IRON, RETICCTPCT in the last 72 hours. Sepsis Labs: No results for input(s): PROCALCITON, LATICACIDVEN in the last 168 hours.  Recent Results (from the past 240 hour(s))  Aerobic/Anaerobic Culture (surgical/deep wound)     Status: None   Collection Time: 04/10/19  2:05 PM   Specimen: Abscess  Result Value Ref Range Status   Specimen Description   Final    ABSCESS DIVERTICULAR Performed at [facility], [address] [place], [place] [address]    Special Requests   Final    Normal Performed at [facility], [address] [place], [place] [address]    Gram Stain   Final    ABUNDANT WBC PRESENT, PREDOMINANTLY PMN ABUNDANT GRAM NEGATIVE RODS ABUNDANT GRAM POSITIVE COCCI FEW GRAM VARIABLE ROD Performed at [facility], [address] [place], [place] [address]    Culture   Final    ABUNDANT MULTIPLE ORGANISMS PRESENT, NONE PREDOMINANT ABUNDANT MIXED ANAEROBIC FLORA PRESENT.  CALL LAB IF FURTHER IID REQUIRED.    Report Status 04/13/2019 FINAL  Final  Aerobic/Anaerobic Culture (surgical/deep wound)     Status: None   Collection Time: 04/10/19  2:06 PM   Specimen: Abscess  Result Value Ref Range Status   Specimen Description   Final    ABSCESS PELVIC Performed at [facility], [address] [place], [place] [address]    Special Requests   Final    Normal Performed at [facility], [address] [place], [place] [address]    Gram Stain   Final    ABUNDANT WBC PRESENT,BOTH PMN AND MONONUCLEAR ABUNDANT GRAM NEGATIVE RODS MODERATE GRAM POSITIVE COCCI MODERATE GRAM VARIABLE ROD Performed at [facility], [address] [place], [place] [address]    Culture   Final    ABUNDANT MULTIPLE ORGANISMS PRESENT, NONE PREDOMINANT ABUNDANT MIXED ANAEROBIC FLORA PRESENT.  CALL LAB IF FURTHER IID REQUIRED.    Report Status 04/13/2019 FINAL  Final       Radiology Studies: Ct Abdomen Pelvis W Contrast  Result Date: 04/15/2019 CLINICAL DATA:  Severe lower abdominal pain. Unable to eat or drink. History of diverticulitis with recent abscess. Drain in place. EXAM:  CT ABDOMEN AND PELVIS WITH CONTRAST TECHNIQUE: Multidetector CT imaging of the abdomen and pelvis was performed using the standard protocol following bolus administration of intravenous contrast. CONTRAST:  100mL OMNIPAQUE IOHEXOL 300 MG/ML SOLN, 30mL OMNIPAQUE IOHEXOL 300 MG/ML SOLN COMPARISON:  Procedures CT 04/10/2019. CT of the abdomen and pelvis 04/09/2019 FINDINGS: Lower chest: The lung bases are clear without focal nodule, mass, or airspace disease. Small pericardial effusion is similar to prior studies. No pleural effusions are present. Hepatobiliary: No focal liver abnormality is seen. No gallstones, gallbladder wall thickening, or biliary dilatation. Distension of the gallbladder is likely secondary to fasting. Pancreas: Unremarkable. No pancreatic ductal dilatation or surrounding inflammatory changes. Spleen: Normal in size without focal abnormality. Adrenals/Urinary Tract: Adrenal glands are normal bilaterally. Kidneys and ureters are unremarkable. There is no nodule or mass lesion. No stone is present. There is no obstruction. Stomach/Bowel: The stomach is moderately distended and fluid filled. There is marked distension of proximal small bowel loops measuring up to 3.8 cm in transverse diameter. Transition point is in the anatomic pelvis adjacent to the more anterior and superior abscess. The distal small bowel is collapsed. Contrast is present in the colon. Gas and stool are present throughout the colon to the rectum. There is a small  amount of contrast in the rectum. Inflammatory changes about the sigmoid colon are improving. Vascular/Lymphatic: No significant vascular calcifications are present. Subcentimeter lymph nodes are likely reactive. Reproductive: Uterus and bilateral adnexa are unremarkable. IUD is in place. Other: Anterior and posterior drainage catheters are stable in position. The anterior collection is collapsed. There is an adjacent collection which may be separate from the drain measuring 2.0 x 2.4 x 1.9 cm. The posterior drain is in place. Previously noted inferior pelvic collection is markedly reduced in size. This collection now measures 3.2 x 6.0 x 3.4 cm. There is still gas with a fluid collection. No other focal fluid collections are present. Musculoskeletal: Vertebral body heights and alignment are maintained. No focal lytic or blastic lesions are present. Small Schmorl's nodes are present. Bony pelvis is within normal limits. The hips are located. Severe degenerative changes are again noted in the left hip. More mild degenerative changes are present on the right. IMPRESSION: 1. Dilated small bowel with a transition point in the anatomic pelvis adjacent to the inflammation is consistent with small bowel obstruction. No discrete bowel injury is evident. 2. The anterior abscess cavity has collapsed. 3. Adjacent small fluid collection measures 2.0 x 2.4 x 1.9 cm. 4. Marked reduction in size of the more inferior pelvic collection, now measuring 3.2 x 6.0 x 3.4 cm. Pigtail drainage catheter remains in place. 5. Reduced inflammatory changes surrounding the sigmoid colon. Electronically Signed   By: Marin Robertshristopher  Mattern M.D.   On: 04/15/2019 15:26      Scheduled Meds:  diatrizoate meglumine-sodium  90 mL Per NG tube Once   enoxaparin (LOVENOX) injection  40 mg Subcutaneous On Call to OR   [START ON 04/17/2019] enoxaparin (LOVENOX) injection  40 mg Subcutaneous Q24H   feeding supplement  1 Container Oral BID BM    fluticasone  1 spray Each Nare Daily   insulin aspart  0-9 Units Subcutaneous TID WC   lidocaine  1 application Other Once   pantoprazole  20 mg Oral BID   sodium chloride flush  10 mL Intracatheter Q8H   sodium chloride flush  10-40 mL Intracatheter Q12H   Continuous Infusions:  sodium chloride Stopped (04/05/19 1756)   sodium chloride 10  mL/hr at 04/12/19 2206   sodium chloride 100 mL/hr at 04/15/19 0919   cefoTEtan (CEFOTAN) IV     methocarbamol (ROBAXIN) IV 500 mg (04/15/19 1243)   piperacillin-tazobactam (ZOSYN)  IV 3.375 g (04/16/19 0602)   TPN ADULT (ION) 75 mL/hr at 04/15/19 1732   TPN ADULT (ION)       LOS: 11 days    Time spent: 29 minutes   Alvira PhilipsEric J [place], DO [facility] [email or web address] 04/16/2019, 10:42 AM

## 2019-04-16 NOTE — Progress Notes (Signed)
Patient ID: Molly Beck, female   DOB: 05-Dec-1965, 53 y.o.   MRN: [id number]       Subjective: Patient complains of nausea today with belching.  Not really passing flatus or BMs since this weekend.  Still with abdominal pain.  Very frustrated understandably so.  Objective: Vital signs in last 24 hours: Temp:  [98.2 F (36.8 C)-98.6 F (37 C)] 98.6 F (37 C) (06/15 0600) Pulse Rate:  [102-104] 103 (06/15 0600) Resp:  [16-20] 20 (06/15 0600) BP: (99-127)/(77-87) 127/77 (06/15 0600) SpO2:  [96 %-97 %] 97 % (06/15 0600) Last BM Date: 04/12/19  Intake/Output from previous day: 06/14 0701 - 06/15 0700 In: 3958 [I.V.:3576.4; IV Piggyback:361.6] Out: 40 [Drains:40] Intake/Output this shift: Total I/O In: 0  Out: 30 [Drains:30]  PE: Heart: regular Lungs: CTAB Abd: soft, but with some distention, LLQ tenderness, +BS, both IR drains in place.  TG drain with tan milky type drainage.  Anterior drain with no output currently. Ext: RLE in cast  Lab Results:  Recent Labs    04/15/19 0321 04/16/19 0323  WBC 8.9 6.8  HGB 9.8* 9.4*  HCT 30.0* 29.7*  PLT 455* 480*   BMET Recent Labs    04/15/19 0321 04/16/19 0323  NA 139 140  K 3.9 4.1  CL 102 105  CO2 27 28  GLUCOSE 119* 134*  BUN 18 19  CREATININE 0.58 0.53  CALCIUM 8.4* 8.3*   PT/INR No results for input(s): LABPROT, INR in the last 72 hours. CMP     Component Value Date/Time   NA 140 04/16/2019 0323   K 4.1 04/16/2019 0323   CL 105 04/16/2019 0323   CO2 28 04/16/2019 0323   GLUCOSE 134 (H) 04/16/2019 0323   BUN 19 04/16/2019 0323   CREATININE 0.53 04/16/2019 0323   CALCIUM 8.3 (L) 04/16/2019 0323   PROT 6.1 (L) 04/16/2019 0323   ALBUMIN 2.4 (L) 04/16/2019 0323   AST 18 04/16/2019 0323   ALT 27 04/16/2019 0323   ALKPHOS 72 04/16/2019 0323   BILITOT 0.3 04/16/2019 0323   GFRNONAA >60 04/16/2019 0323   GFRAA >60 04/16/2019 0323   Lipase     Component Value Date/Time   LIPASE 25 09/12/2015 1615        Studies/Results: Ct Abdomen Pelvis W Contrast  Result Date: 04/15/2019 CLINICAL DATA:  Severe lower abdominal pain. Unable to eat or drink. History of diverticulitis with recent abscess. Drain in place. EXAM: CT ABDOMEN AND PELVIS WITH CONTRAST TECHNIQUE: Multidetector CT imaging of the abdomen and pelvis was performed using the standard protocol following bolus administration of intravenous contrast. CONTRAST:  154mL OMNIPAQUE IOHEXOL 300 MG/ML SOLN, 82mL OMNIPAQUE IOHEXOL 300 MG/ML SOLN COMPARISON:  Procedures CT 04/10/2019. CT of the abdomen and pelvis 04/09/2019 FINDINGS: Lower chest: The lung bases are clear without focal nodule, mass, or airspace disease. Small pericardial effusion is similar to prior studies. No pleural effusions are present. Hepatobiliary: No focal liver abnormality is seen. No gallstones, gallbladder wall thickening, or biliary dilatation. Distension of the gallbladder is likely secondary to fasting. Pancreas: Unremarkable. No pancreatic ductal dilatation or surrounding inflammatory changes. Spleen: Normal in size without focal abnormality. Adrenals/Urinary Tract: Adrenal glands are normal bilaterally. Kidneys and ureters are unremarkable. There is no nodule or mass lesion. No stone is present. There is no obstruction. Stomach/Bowel: The stomach is moderately distended and fluid filled. There is marked distension of proximal small bowel loops measuring up to 3.8 cm in transverse diameter. Transition point is  in the anatomic pelvis adjacent to the more anterior and superior abscess. The distal small bowel is collapsed. Contrast is present in the colon. Gas and stool are present throughout the colon to the rectum. There is a small amount of contrast in the rectum. Inflammatory changes about the sigmoid colon are improving. Vascular/Lymphatic: No significant vascular calcifications are present. Subcentimeter lymph nodes are likely reactive. Reproductive: Uterus and bilateral  adnexa are unremarkable. IUD is in place. Other: Anterior and posterior drainage catheters are stable in position. The anterior collection is collapsed. There is an adjacent collection which may be separate from the drain measuring 2.0 x 2.4 x 1.9 cm. The posterior drain is in place. Previously noted inferior pelvic collection is markedly reduced in size. This collection now measures 3.2 x 6.0 x 3.4 cm. There is still gas with a fluid collection. No other focal fluid collections are present. Musculoskeletal: Vertebral body heights and alignment are maintained. No focal lytic or blastic lesions are present. Small Schmorl's nodes are present. Bony pelvis is within normal limits. The hips are located. Severe degenerative changes are again noted in the left hip. More mild degenerative changes are present on the right. IMPRESSION: 1. Dilated small bowel with a transition point in the anatomic pelvis adjacent to the inflammation is consistent with small bowel obstruction. No discrete bowel injury is evident. 2. The anterior abscess cavity has collapsed. 3. Adjacent small fluid collection measures 2.0 x 2.4 x 1.9 cm. 4. Marked reduction in size of the more inferior pelvic collection, now measuring 3.2 x 6.0 x 3.4 cm. Pigtail drainage catheter remains in place. 5. Reduced inflammatory changes surrounding the sigmoid colon. Electronically Signed   By: Marin Robertshristopher  Mattern M.D.   On: 04/15/2019 15:26    Anti-infectives: Anti-infectives (From admission, onward)   Start     Dose/Rate Route Frequency Ordered Stop   04/16/19 0600  cefoTEtan (CEFOTAN) 2 g in sodium chloride 0.9 % 100 mL IVPB     2 g 200 mL/hr over 30 Minutes Intravenous On call to O.R. 04/15/19 1505 04/17/19 0559   04/05/19 0600  piperacillin-tazobactam (ZOSYN) IVPB 3.375 g     3.375 g 12.5 mL/hr over 240 Minutes Intravenous Every 8 hours 04/05/19 0121     04/05/19 0000  piperacillin-tazobactam (ZOSYN) IVPB 3.375 g     3.375 g 100 mL/hr over 30  Minutes Intravenous  Once 04/04/19 2355 04/05/19 0119       Assessment/Plan GERD Osteoarthritis - takes meloxicam and tramadol at baseline Recent right ankle surgery by Dr. Hewitt5/26/2020 Malnutrition - prealbumin <5, on TPN  Sigmoiddiverticulitis with abscesses - s/p IR drain x2 placement 6/9, cultureswith multiple organisms present, none predominant -new CT reveals a SBO with transition point, but diverticulitis seems to be improving some. -will try NGT with SBO protocol.  If she fails to improve over the next couple of days then she will need surgery.   -long discussion was had with the patient and her mother via phone. -she is agreeable to this plan.  ID -zosyn 6/4>> VTE -SCD,lovenox FEN -IVF, TPN,NPO Foley -none Follow up -TBD   LOS: 11 days    Letha CapeKelly E Laquinta Hazell , [facility] 04/16/2019, 10:29 AM Pager: [phone]

## 2019-04-16 NOTE — Progress Notes (Signed)
PT Cancellation Note  Patient Details Name: Molly Beck MRN: [id number] DOB: 05-20-1966   Cancelled Treatment:    Reason Eval/Treat Not Completed: Attempted PT tx session-pt declined participation on today. Will check back another day.   Weston Anna, PT Acute Rehabilitation Services Pager: [phone] Office: [phone]

## 2019-04-16 NOTE — Progress Notes (Signed)
Nutrition Follow-up  DOCUMENTATION CODES:   Not applicable  INTERVENTION:  - continue TPN per Pharmacy.  - weigh patient today.   NUTRITION DIAGNOSIS:   Inadequate oral intake related to inability to eat as evidenced by NPO status. -ongoing  GOAL:   Patient will meet greater than or equal to 90% of their needs  -met with TPN regimen  MONITOR:   Diet advancement, Labs, Weight trends, Other (Comment)(TPN regimen)  ASSESSMENT:   53 y.o. female with medical history significant of GERD. She had elective reconstructive surgery of R foot a week ago. She presented to the ED for evaluation of abdominal pain. Patient reported constipation d/t pain medications since surgery. She was experiencing LLQ abdominal pain for several days which became worse in the 24 hours PTA; sharp, excruciating. CT abdomen/pelvis showed acute sigmoid colitis with small foci of adjacent gas that may be extra-luminal and may indicate micro-perforation. No abscess or drainable fluid collection at the time of admission. General surgery was consulted.  Patient has not been weighed since 6/9. She has double lumen PICC in R brachial and is receiving custom TPN @ goal rate of 75 ml/hr which is providing 1915 kcal (96% estimated kcal need), 99 grams protein (100% estimated protein need). Diet advanced from NPO to CLD on 6/11 at 8:00 AM and then to FLD on 6/12 at 9:00 AM with no intakes documented during those dates. Diet downgraded from FLD to CLD on 6/13 at 11:27 AM and then back to NPO today at 4:30 AM.  Per notes: sepsis 2/2 acute sigmoid colitis with microperforation, plan for NGT placement today (6/15) for decompression, likely to undergo laparotomy with colostomy in the next 48 hours if now improvement in in bowel decompression.    Medications reviewed; sliding scale novolog, 20 mg oral protonix BID. Labs reviewed; CBGs: 116 and 119 mg/dl today, Ca: 8.3 mg/dl.  IVF; NS @ 100 ml/hr.    Diet Order:   Diet Order             Diet NPO time specified Except for: Ice Chips, Sips with Meds  Diet effective ____              EDUCATION NEEDS:   No education needs have been identified at this time  Skin:  Skin Assessment: Reviewed RN Assessment  Last BM:  6/4  Height:   Ht Readings from Last 1 Encounters:  04/05/19 5' 4" (1.626 m)    Weight:   Wt Readings from Last 1 Encounters:  04/10/19 78.9 kg    Ideal Body Weight:  54.5 kg  BMI:  Body mass index is 29.86 kg/m.  Estimated Nutritional Needs:   Kcal:  2000-2240 kcal  Protein:  95-105 grams  Fluid:  >/= 2 L/day     Jessica Ostheim, MS, RD, LDN, CNSC Inpatient Clinical Dietitian Pager # [phone] After hours/weekend pager # [phone]  

## 2019-04-16 NOTE — Progress Notes (Signed)
Gastrographin given at 1515.  NG tube clamped for one hour.  Radiology called, KUB to be taken at 2315.

## 2019-04-16 NOTE — Progress Notes (Signed)
Patient is 3 weeks post-op S/P R flatfoot reconstruction and lapidus bunion correction.  Dr. Doran Durand and I received a message in the office today about concerns of the patient being unable to attend to her 2 week outpatient post-op visit due to her current hospitalization.    I attempted to call in to the patient's room twice this afternoon, however no one answered.  Dr. Doran Durand has spoken to the patient previously about this concern.  The patient is fine to stay in her post-op splint until she is D/C'd from the hospital and can be seen in the office to have sutures removed and have a [place] properly applied.  The stitches can stay in without causing any type of difficulty in regards to her wound healing.    Please call with any other questions/concerns.  Mechele Claude [name] [facility] Office:  [phone]

## 2019-04-16 NOTE — Progress Notes (Signed)
NG tube inserted with an immediate return of dark green liquid.  KUB completed.  NG hooked up to low intermittent suction per order.  Pt states pain and nausea are decreased after tube placement.

## 2019-04-16 NOTE — Progress Notes (Signed)
PHARMACY - ADULT TOTAL PARENTERAL NUTRITION CONSULT NOTE   Pharmacy Consult for TPN Indication: intolerance to enteral feedings, sigmoid diverticulitis, SBO  Patient Measurements: Height: 5\' 4"  (162.6 cm) Weight: 173 lb 15.1 oz (78.9 kg) IBW/kg (Calculated) : 54.7 TPN AdjBW (KG): 61 Body mass index is 29.86 kg/m.  Insulin Requirements: 0 units SSI last 24hr  Current Nutrition: NPO, TPN  IVF: NS at 112ml/hr  Central access:  PICC to be placed 6/9 TPN start date: 6/9  ASSESSMENT                                                                                                          HPI: 53 yo female with sigmoid diverticulitis with abscesses worsened since admission.  IR placed drain 6/9. Patient has been intolerant to enteral feedings for about a week so plan to place PICC line and start TPN per CCS.  Significant events:  6/15: plan for sigmoid colectomy with colostomy today  Today, 04/16/19  Glucose - CBGs at goal < 150, stable  Electrolytes - stable WNL  Renal - SCr 0.53 stable  LFTs - WNL  TGs - 91 (6/10), 54 (6/15)  Prealbumin - <5 (6/10), 8.3 (6/15)  NUTRITIONAL GOALS                                                                                             RD recs: Kcal/day: 2000-2240, protein/day: 95-105g  Custom TPN to provide 99g protein per day and 1915 kcal/day at goal rate of 75 ml/hr (96% of estimated needs per RD)  PLAN                                                                                                                         At 1800 today:  Continue custom TPN at goal rate of 75 ml/hr  TPN will contain standard electrolytes except: increase Na at 75, K at 80, and max acetate.   TPN to contain standard multivitamins daily and trace elements on MWF due to national shortage  Continue IVF at 185ml/hr  Continue CBG/SSI q8h  TPN lab panels on Mondays & Thursdays.  Netta Cedars, PharmD, BCPS 04/16/2019 8:23 AM

## 2019-04-16 NOTE — Progress Notes (Signed)
  Chart review   Still copious output from drain = ~85 mL total Right anterior with 60 mL Left buttock 25 mL  CT showed = Anterior and posterior drainage catheters are stable in position. The anterior collection is collapsed. There is an adjacent collection which may be separate from the drain measuring 2.0 x 2.4 x 1.9 cm. The posterior drain is in place. Previously noted inferior pelvic collection is markedly reduced in size. This collection now measures 3.2 x 6.0 x 3.4 cm. There is still gas with a fluid Collection.  Still too much output to consider removal at this time.  Notes reviewed - NGT now in place for SBO. Gen Surg monitoring for 48 hours.  Will continue to follow.  Murrell Redden [name] 04/16/2019 3:44 PM

## 2019-04-17 ENCOUNTER — Inpatient Hospital Stay (HOSPITAL_COMMUNITY): Payer: PRIVATE HEALTH INSURANCE

## 2019-04-17 LAB — BASIC METABOLIC PANEL
Anion gap: 11 (ref 5–15)
BUN: 14 mg/dL (ref 6–20)
CO2: 22 mmol/L (ref 22–32)
Calcium: 8.3 mg/dL — ABNORMAL LOW (ref 8.9–10.3)
Chloride: 108 mmol/L (ref 98–111)
Creatinine, Ser: 0.47 mg/dL (ref 0.44–1.00)
GFR calc Af Amer: >60 mL/min (ref >60)
GFR calc non Af Amer: >60 mL/min (ref >60)
Glucose, Bld: 132 mg/dL — ABNORMAL HIGH (ref 70–99)
Potassium: 3.7 mmol/L (ref 3.5–5.1)
Sodium: 141 mmol/L (ref 135–145)

## 2019-04-17 LAB — CBC
HCT: 30 % — ABNORMAL LOW (ref 36.0–46.0)
Hemoglobin: 9.7 g/dL — ABNORMAL LOW (ref 12.0–15.0)
MCH: 30.8 pg (ref 26.0–34.0)
MCHC: 32.3 g/dL (ref 30.0–36.0)
MCV: 95.2 fL (ref 80.0–100.0)
Platelets: 534 10*3/uL — ABNORMAL HIGH (ref 150–400)
RBC: 3.15 MIL/uL — ABNORMAL LOW (ref 3.87–5.11)
RDW: 13.6 % (ref 11.5–15.5)
WBC: 7.6 10*3/uL (ref 4.0–10.5)
nRBC: 0 % (ref 0.0–0.2)

## 2019-04-17 LAB — GLUCOSE, CAPILLARY
Glucose-Capillary: 118 mg/dL — ABNORMAL HIGH (ref 70–99)
Glucose-Capillary: 100 mg/dL — ABNORMAL HIGH (ref 70–99)
Glucose-Capillary: 114 mg/dL — ABNORMAL HIGH (ref 70–99)

## 2019-04-17 LAB — MAGNESIUM: Magnesium: 2.2 mg/dL (ref 1.7–2.4)

## 2019-04-17 MED ORDER — TRAVASOL 10 % IV SOLN
INTRAVENOUS | Status: AC
  Administered 2019-04-17: 18:00:00 via INTRAVENOUS
  Filled 2019-04-17: qty 990

## 2019-04-17 MED ORDER — LORAZEPAM 2 MG/ML IJ SOLN
0.2500 mg | Freq: Once | INTRAMUSCULAR | Status: AC
  Administered 2019-04-17: 0.25 mg via INTRAVENOUS
  Filled 2019-04-17: qty 1

## 2019-04-17 MED ORDER — LORAZEPAM 2 MG/ML IJ SOLN
0.2500 mg | Freq: Four times a day (QID) | INTRAMUSCULAR | Status: DC | PRN
  Administered 2019-04-17: 0.25 mg via INTRAVENOUS
  Filled 2019-04-17: qty 1

## 2019-04-17 NOTE — Progress Notes (Addendum)
Subjective: CC: Nausea Patient reports that her abdominal pain and distension feel "much better" today and have nearly resolved. She passed a small amount of flatus yesterday night before going to bed. She does report waves of nausea throughout the night and into the morning. She had about 1250cc of dark green fluid out of NG tube. Didn't sleep well. Reports the NG tube is uncomfortable despite Cepacol and chloraseptic spray.   Objective: Vital signs in last 24 hours: Temp:  [97.9 F (36.6 C)-99.1 F (37.3 C)] 99.1 F (37.3 C) (06/16 0536) Pulse Rate:  [100-126] 100 (06/16 0536) Resp:  [16-20] 18 (06/16 0536) BP: (122-134)/(83-96) 134/92 (06/16 0536) SpO2:  [93 %-99 %] 97 % (06/16 0536) Weight:  [79.7 kg] 79.7 kg (06/15 1700) Last BM Date: 04/12/19  Intake/Output from previous day: 06/15 0701 - 06/16 0700 In: 3245.8 [I.V.:2985.8; IV Piggyback:200] Out: 1421 [Urine:1; Emesis/NG output:1250; Drains:170] Intake/Output this shift: No intake/output data recorded.  PE: Gen: Sleeping, awoken and appears awake and alert, NAD Lungs: Normal rate and effort Abd: Soft, mild distension, NT, +BS. Both IR drains are in place. Right abdominal drain with milky drainage. 105cc/24 hours. Buttock drain with white transparent fluid. 65cc/24 hours.    Lab Results:  Recent Labs    04/16/19 0323 04/17/19 0430  WBC 6.8 7.6  HGB 9.4* 9.7*  HCT 29.7* 30.0*  PLT 480* 534*   BMET Recent Labs    04/16/19 0323 04/17/19 0430  NA 140 141  K 4.1 3.7  CL 105 108  CO2 28 22  GLUCOSE 134* 132*  BUN 19 14  CREATININE 0.53 0.47  CALCIUM 8.3* 8.3*   PT/INR No results for input(s): LABPROT, INR in the last 72 hours. CMP     Component Value Date/Time   NA 141 04/17/2019 0430   K 3.7 04/17/2019 0430   CL 108 04/17/2019 0430   CO2 22 04/17/2019 0430   GLUCOSE 132 (H) 04/17/2019 0430   BUN 14 04/17/2019 0430   CREATININE 0.47 04/17/2019 0430   CALCIUM 8.3 (L) 04/17/2019 0430   PROT 6.1  (L) 04/16/2019 0323   ALBUMIN 2.4 (L) 04/16/2019 0323   AST 18 04/16/2019 0323   ALT 27 04/16/2019 0323   ALKPHOS 72 04/16/2019 0323   BILITOT 0.3 04/16/2019 0323   GFRNONAA >60 04/17/2019 0430   GFRAA >60 04/17/2019 0430   Lipase     Component Value Date/Time   LIPASE 25 09/12/2015 1615       Studies/Results: Ct Abdomen Pelvis W Contrast  Result Date: 04/15/2019 CLINICAL DATA:  Severe lower abdominal pain. Unable to eat or drink. History of diverticulitis with recent abscess. Drain in place. EXAM: CT ABDOMEN AND PELVIS WITH CONTRAST TECHNIQUE: Multidetector CT imaging of the abdomen and pelvis was performed using the standard protocol following bolus administration of intravenous contrast. CONTRAST:  100mL OMNIPAQUE IOHEXOL 300 MG/ML SOLN, 30mL OMNIPAQUE IOHEXOL 300 MG/ML SOLN COMPARISON:  Procedures CT 04/10/2019. CT of the abdomen and pelvis 04/09/2019 FINDINGS: Lower chest: The lung bases are clear without focal nodule, mass, or airspace disease. Small pericardial effusion is similar to prior studies. No pleural effusions are present. Hepatobiliary: No focal liver abnormality is seen. No gallstones, gallbladder wall thickening, or biliary dilatation. Distension of the gallbladder is likely secondary to fasting. Pancreas: Unremarkable. No pancreatic ductal dilatation or surrounding inflammatory changes. Spleen: Normal in size without focal abnormality. Adrenals/Urinary Tract: Adrenal glands are normal bilaterally. Kidneys and ureters are unremarkable. There is no  nodule or mass lesion. No stone is present. There is no obstruction. Stomach/Bowel: The stomach is moderately distended and fluid filled. There is marked distension of proximal small bowel loops measuring up to 3.8 cm in transverse diameter. Transition point is in the anatomic pelvis adjacent to the more anterior and superior abscess. The distal small bowel is collapsed. Contrast is present in the colon. Gas and stool are present  throughout the colon to the rectum. There is a small amount of contrast in the rectum. Inflammatory changes about the sigmoid colon are improving. Vascular/Lymphatic: No significant vascular calcifications are present. Subcentimeter lymph nodes are likely reactive. Reproductive: Uterus and bilateral adnexa are unremarkable. IUD is in place. Other: Anterior and posterior drainage catheters are stable in position. The anterior collection is collapsed. There is an adjacent collection which may be separate from the drain measuring 2.0 x 2.4 x 1.9 cm. The posterior drain is in place. Previously noted inferior pelvic collection is markedly reduced in size. This collection now measures 3.2 x 6.0 x 3.4 cm. There is still gas with a fluid collection. No other focal fluid collections are present. Musculoskeletal: Vertebral body heights and alignment are maintained. No focal lytic or blastic lesions are present. Small Schmorl's nodes are present. Bony pelvis is within normal limits. The hips are located. Severe degenerative changes are again noted in the left hip. More mild degenerative changes are present on the right. IMPRESSION: 1. Dilated small bowel with a transition point in the anatomic pelvis adjacent to the inflammation is consistent with small bowel obstruction. No discrete bowel injury is evident. 2. The anterior abscess cavity has collapsed. 3. Adjacent small fluid collection measures 2.0 x 2.4 x 1.9 cm. 4. Marked reduction in size of the more inferior pelvic collection, now measuring 3.2 x 6.0 x 3.4 cm. Pigtail drainage catheter remains in place. 5. Reduced inflammatory changes surrounding the sigmoid colon. Electronically Signed   By: Marin Robertshristopher  Mattern M.D.   On: 04/15/2019 15:26   Dg Abd Portable 1v-small Bowel Obstruction Protocol-initial, 8 Hr Delay  Result Date: 04/16/2019 CLINICAL DATA:  Small bowel obstruction. 8 hour delayed film. EXAM: PORTABLE ABDOMEN - 1 VIEW COMPARISON:  CT yesterday. FINDINGS:  Enteric contrast in the colon, however unclear if this contrast was from prior CT or administered for small bowel protocol. There is residual contrast in the stomach. Persistent dilated small bowel in the central abdomen a 5.1 cm. Two drainage catheters overlie the pelvis. Advanced left hip osteoarthritis. IMPRESSION: Enteric contrast in the colon, however it is unclear if this is residual contrast that was seen on CT yesterday versus that administered for the small-bowel protocol. There is residual contrast in the stomach and persistent dilated small bowel in the central abdomen. Recommend 24 hour delayed film. Electronically Signed   By: Narda RutherfordMelanie  Sanford M.D.   On: 04/16/2019 23:43   Dg Abd Portable 1v-small Bowel Protocol-position Verification  Result Date: 04/16/2019 CLINICAL DATA:  NG tube placement. EXAM: PORTABLE ABDOMEN - 1 VIEW COMPARISON:  Abdominal CT 04/15/2019 FINDINGS: Enteric catheter projects over the expected location of gastric cardia. Persistent small bowel loop dilation with maximum diameter of 5.4 cm. No free intra-abdominal gas is seen. IMPRESSION: 1. Enteric catheter projects over the expected location of gastric cardia. 2. Persistent small bowel obstruction. Electronically Signed   By: Ted Mcalpineobrinka  Dimitrova M.D.   On: 04/16/2019 13:20    Anti-infectives: Anti-infectives (From admission, onward)   Start     Dose/Rate Route Frequency Ordered Stop   04/16/19  0600  cefoTEtan (CEFOTAN) 2 g in sodium chloride 0.9 % 100 mL IVPB     2 g 200 mL/hr over 30 Minutes Intravenous On call to O.R. 04/15/19 1505 04/17/19 0559   04/05/19 0600  piperacillin-tazobactam (ZOSYN) IVPB 3.375 g     3.375 g 12.5 mL/hr over 240 Minutes Intravenous Every 8 hours 04/05/19 0121     04/05/19 0000  piperacillin-tazobactam (ZOSYN) IVPB 3.375 g     3.375 g 100 mL/hr over 30 Minutes Intravenous  Once 04/04/19 2355 04/05/19 0119       Assessment/Plan GERD Osteoarthritis - takes meloxicam and tramadol at  baseline Recent right ankle surgery by Dr. Hewitt5/26/2020 Malnutrition - prealbumin <5, on TPN  Sigmoiddiverticulitis with abscesses SBO - s/p IR drain x2 placement 6/9, cultureswith multiple organisms present, none predominant. Drains per IR  -new CT reveals a SBO with transition point, but diverticulitis seems to be improving some. - Continue NGT and SBO protocol. AM film pending. Clinically improving but still nauseated. Will recheck later today.  - If she fails to improve over the next couple of days then she will need surgery.  - Mobilize as able for bowel function. She declined PT yesterday - Keep K >4 and Mg >2 for bowel function.  - IS  ID -zosyn 6/3>>, WBC 7,600 VTE -SCD,lovenox FEN -IVF, TPN,NPO, Mg 2.2, K 3.7 Foley -none Follow up -TBD    LOS: 12 days    Jillyn Ledger , [facility] Surgery 04/17/2019, 7:41 AM Pager: [phone]

## 2019-04-17 NOTE — Progress Notes (Signed)
PROGRESS NOTE    Molly Beck  VWU:[id number] DOB: 1966/06/06 DOA: 04/04/2019 PCP: Clementeen Graham, [name]     Brief Narrative:   Molly Beck is a 53 y.o. female with medical history significant of GERD, elective surgery of her right foot a week ago presenting to the hospital for evaluation of abdominal pain.  Patient states she had a reconstructive right foot surgery done a week ago.  States since then she has been constipated due to taking pain medications.  Her last bowel movement was prior to the surgery.  For the past few days she is having left lower quadrant abdominal pain which became worse for the past 24 hours.  The pain is sharp and excruciating.  States today she called her surgeon's office and was advised to take magnesium citrate for constipation.  She vomited soon after taking magnesium citrate.  Denies any fevers.  She continues to complain of abdominal pain despite receiving a dose of morphine and multiple doses of fentanyl in the ED. CT abdomen pelvis showing acute sigmoid colitis with small foci of adjacent gas that may be extra luminal and may indicate microperforation.  No abscess or drainable fluid collection. She was admitted for further treatment and care.  General surgery was consulted.  New events last 24 hours / Subjective: Patient with improved abdominal discomfort overnight following insertion of NG tube with significant output.  Repeat imaging studies suggest continued small bowel obstruction.  Intra-abdominal drains continue to drain well.  Does not appear to be progressing well, likely for surgery tomorrow.  Patient continues with fatigue and some depressive symptoms given prolonged hospital course and lack of progression.  Continues on TPN.  No other complaints at this time. Denies headache, no fever/chills/night sweats, no chest pain, palpitations, no shortness of breath, no cough/congestion.  No acute events overnight per nursing staff.  Assessment &  Plan:   Principal Problem:   Perforated sigmoid colon  Active Problems:   Colitis   Sepsis (HCC)   Hypokalemia   H/O foot surgery   Sepsis secondary to acute sigmoid colitis with microperforation Sepsis, present on admission Patient presenting to ED with progressive abdominal pain.  CT abdomen/pelvis notable for acute sigmoid colitis with adjacent gas likely secondary to microperforation.  Patient was initially started on IV Zosyn.  During her initial hospitalization, her abdominal pain continued not to improve with resultant fevers and elevated white count.  Repeat CT abdomen/pelvis reveals marked worsening in the pelvis with 2 abscesses.  Interventional radiology was consulted and placed two drains on 04/10/2019.  --General surgery/IR following, appreciate assistance --Output past 24h: Right abdomen 105 mL's, buttock 65 mL's --Blood culture 04/05/2019 no growth x5 days --Wound culture 04/10/2019: Abundant mixed organisms, none predominant --Continue Zosyn --NGT placement 04/16/2019 per general surgery; out past 24 hours, continue LWS --TPN, NS at 100 mL/hr given intolerance to diet --Continue to monitor drain output and BMs --Likely laparotomy with colostomy next 24 hours if no improvement with bowel decompression  Rash Maculopapular rash bilateral distribution on her back. Could be due to heat/sweat rash or contact from bedsheets.  She has tolerated IV Zosyn for the past week without any signs of allergic reaction.  This does not appear to be an reaction to Zosyn at this time.   --continue IV Benadryl and hydrocortisone cream prn.  --continue hygiene and change of bed sheets daily  Recent right foot surgery Currently in a cast, plans to follow-up with her orthopedic surgeon as an outpatient --PT  following --Continue nonweightbearing right lower extremity  Hypokalemia Potassium 3.7 today.   --Repeat electrolytes in a.m. to include magnesium   DVT prophylaxis: Lovenox Code  Status: Full Family Communication: None Disposition Plan: Continue inpatient hospitalization, on TPN, IV antibiotics, NGT to LWS, likely laparotomy with colostomy tomorrow if no resolution with SBO.   Consultants:   General surgery  IR  Procedures:   IR drain placement x 2 04/10/2019  PICC RUE 04/10/2019  Antimicrobials:  Anti-infectives (From admission, onward)   Start     Dose/Rate Route Frequency Ordered Stop   04/16/19 0600  cefoTEtan (CEFOTAN) 2 g in sodium chloride 0.9 % 100 mL IVPB     2 g 200 mL/hr over 30 Minutes Intravenous On call to O.R. 04/15/19 1505 04/17/19 0559   04/05/19 0600  piperacillin-tazobactam (ZOSYN) IVPB 3.375 g     3.375 g 12.5 mL/hr over 240 Minutes Intravenous Every 8 hours 04/05/19 0121     04/05/19 0000  piperacillin-tazobactam (ZOSYN) IVPB 3.375 g     3.375 g 100 mL/hr over 30 Minutes Intravenous  Once 04/04/19 2355 04/05/19 0119       Objective: Vitals:   04/16/19 2037 04/16/19 2100 04/17/19 0536 04/17/19 1050  BP:   (!) 134/92   Pulse: (!) 114 (!) 108 100   Resp:   18   Temp:   99.1 F (37.3 C)   TempSrc:   Oral   SpO2: 97%  97%   Weight:    81.5 kg  Height:        Intake/Output Summary (Last 24 hours) at 04/17/2019 1143 Last data filed at 04/17/2019 0600 Gross per 24 hour  Intake 3245.75 ml  Output 1336 ml  Net 1909.75 ml   Filed Weights   04/10/19 1059 04/16/19 1700 04/17/19 1050  Weight: 78.9 kg 79.7 kg 81.5 kg    Examination: General exam: Appears calm and comfortable  Respiratory system: Clear to auscultation. Respiratory effort normal. Cardiovascular system: S1 & S2 heard, RRR. No JVD, murmurs, rubs, gallops or clicks. No pedal edema. Gastrointestinal system: Abdomen is nondistended, soft and tender to palpation left lower quadrant and pelvic, bowel sounds present but very faint, drains noted, NG tube noted Central nervous system: Alert and oriented. No focal neurological deficits. Extremities: Symmetric 5 x 5  power. Skin: No current rashes, lesions or ulcerations Psychiatry: Judgement and insight appear normal. Mood & affect appropriate.    Data Reviewed: I have personally reviewed following labs and imaging studies  CBC: Recent Labs  Lab 04/11/19 0531  04/13/19 0327 04/14/19 0311 04/15/19 0321 04/16/19 0323 04/17/19 0430  WBC 9.2   < > 8.1 9.8 8.9 6.8 7.6  NEUTROABS 6.7  --   --   --   --  5.2  --   HGB 9.8*   < > 9.3* 9.4* 9.8* 9.4* 9.7*  HCT 29.4*   < > 28.0* 29.3* 30.0* 29.7* 30.0*  MCV 90.7   < > 91.2 94.2 94.3 95.2 95.2  PLT 318   < > 360 407* 455* 480* 534*   < > = values in this interval not displayed.   Basic Metabolic Panel: Recent Labs  Lab 04/11/19 0531 04/12/19 0424 04/13/19 0327 04/14/19 0311 04/15/19 0321 04/16/19 0323 04/17/19 0430  NA 136 136 135 136 139 140 141  K 3.0* 3.2* 4.1 4.4 3.9 4.1 3.7  CL 108 100 103 101 102 105 108  CO2 20* 26 25 26 27 28 22   GLUCOSE 131* 116* 128* 129*  119* 134* 132*  BUN 5* 7 11 16 18 19 14   CREATININE 0.45 0.31* 0.39* 0.46 0.58 0.53 0.47  CALCIUM 7.8* 7.5* 7.8* 8.2* 8.4* 8.3* 8.3*  MG 1.7 2.1 2.2 2.3  --  2.2 2.2  PHOS 2.1* 3.0 4.0  --   --  4.0  --    GFR: Estimated Creatinine Clearance: 84.9 mL/min (by C-G formula based on SCr of 0.47 mg/dL). Liver Function Tests: Recent Labs  Lab 04/11/19 0531 04/12/19 0424 04/16/19 0323  AST 18 19 18   ALT 20 20 27   ALKPHOS 72 74 72  BILITOT 0.6 0.4 0.3  PROT 5.6* 5.7* 6.1*  ALBUMIN 2.4* 2.4* 2.4*   No results for input(s): LIPASE, AMYLASE in the last 168 hours. No results for input(s): AMMONIA in the last 168 hours. Coagulation Profile: No results for input(s): INR, PROTIME in the last 168 hours. Cardiac Enzymes: No results for input(s): CKTOTAL, CKMB, CKMBINDEX, TROPONINI in the last 168 hours. BNP (last 3 results) No results for input(s): PROBNP in the last 8760 hours. HbA1C: Recent Labs    04/15/19 0321  HGBA1C 4.9   CBG: Recent Labs  Lab 04/16/19 0745  04/16/19 1102 04/16/19 1632 04/17/19 0657 04/17/19 1101  GLUCAP 116* 119* 109* 118* 100*   Lipid Profile: Recent Labs    04/16/19 0323  TRIG 54   Thyroid Function Tests: No results for input(s): TSH, T4TOTAL, FREET4, T3FREE, THYROIDAB in the last 72 hours. Anemia Panel: No results for input(s): VITAMINB12, FOLATE, FERRITIN, TIBC, IRON, RETICCTPCT in the last 72 hours. Sepsis Labs: No results for input(s): PROCALCITON, LATICACIDVEN in the last 168 hours.  Recent Results (from the past 240 hour(s))  Aerobic/Anaerobic Culture (surgical/deep wound)     Status: None   Collection Time: 04/10/19  2:05 PM   Specimen: Abscess  Result Value Ref Range Status   Specimen Description   Final    ABSCESS DIVERTICULAR Performed at [facility], [address] [place], [place] [address]    Special Requests   Final    Normal Performed at [facility], [address] [place], [place] [address]    Gram Stain   Final    ABUNDANT WBC PRESENT, PREDOMINANTLY PMN ABUNDANT GRAM NEGATIVE RODS ABUNDANT GRAM POSITIVE COCCI FEW GRAM VARIABLE ROD Performed at [facility], [address] [place], [place] [address]    Culture   Final    ABUNDANT MULTIPLE ORGANISMS PRESENT, NONE PREDOMINANT ABUNDANT MIXED ANAEROBIC FLORA PRESENT.  CALL LAB IF FURTHER IID REQUIRED.    Report Status 04/13/2019 FINAL  Final  Aerobic/Anaerobic Culture (surgical/deep wound)     Status: None   Collection Time: 04/10/19  2:06 PM   Specimen: Abscess  Result Value Ref Range Status   Specimen Description   Final    ABSCESS PELVIC Performed at [facility], [address] [place], [place] [address]    Special Requests   Final    Normal Performed at [facility], [address] [place], [place] [address]    Gram Stain   Final    ABUNDANT WBC PRESENT,BOTH PMN AND MONONUCLEAR ABUNDANT GRAM NEGATIVE RODS MODERATE GRAM POSITIVE  COCCI MODERATE GRAM VARIABLE ROD Performed at [facility], [address] [place], [place] [address]    Culture   Final    ABUNDANT MULTIPLE ORGANISMS PRESENT, NONE PREDOMINANT ABUNDANT MIXED ANAEROBIC FLORA PRESENT.  CALL LAB IF FURTHER IID REQUIRED.    Report Status  04/13/2019 FINAL  Final       Radiology Studies: Ct Abdomen Pelvis W Contrast  Result Date: 04/15/2019 CLINICAL DATA:  Severe lower abdominal pain. Unable to eat or drink. History of diverticulitis with recent abscess. Drain in place. EXAM: CT ABDOMEN AND PELVIS WITH CONTRAST TECHNIQUE: Multidetector CT imaging of the abdomen and pelvis was performed using the standard protocol following bolus administration of intravenous contrast. CONTRAST:  100mL OMNIPAQUE IOHEXOL 300 MG/ML SOLN, 30mL OMNIPAQUE IOHEXOL 300 MG/ML SOLN COMPARISON:  Procedures CT 04/10/2019. CT of the abdomen and pelvis 04/09/2019 FINDINGS: Lower chest: The lung bases are clear without focal nodule, mass, or airspace disease. Small pericardial effusion is similar to prior studies. No pleural effusions are present. Hepatobiliary: No focal liver abnormality is seen. No gallstones, gallbladder wall thickening, or biliary dilatation. Distension of the gallbladder is likely secondary to fasting. Pancreas: Unremarkable. No pancreatic ductal dilatation or surrounding inflammatory changes. Spleen: Normal in size without focal abnormality. Adrenals/Urinary Tract: Adrenal glands are normal bilaterally. Kidneys and ureters are unremarkable. There is no nodule or mass lesion. No stone is present. There is no obstruction. Stomach/Bowel: The stomach is moderately distended and fluid filled. There is marked distension of proximal small bowel loops measuring up to 3.8 cm in transverse diameter. Transition point is in the anatomic pelvis adjacent to the more anterior and superior abscess. The distal small bowel is collapsed. Contrast is present in the colon. Gas and stool are  present throughout the colon to the rectum. There is a small amount of contrast in the rectum. Inflammatory changes about the sigmoid colon are improving. Vascular/Lymphatic: No significant vascular calcifications are present. Subcentimeter lymph nodes are likely reactive. Reproductive: Uterus and bilateral adnexa are unremarkable. IUD is in place. Other: Anterior and posterior drainage catheters are stable in position. The anterior collection is collapsed. There is an adjacent collection which may be separate from the drain measuring 2.0 x 2.4 x 1.9 cm. The posterior drain is in place. Previously noted inferior pelvic collection is markedly reduced in size. This collection now measures 3.2 x 6.0 x 3.4 cm. There is still gas with a fluid collection. No other focal fluid collections are present. Musculoskeletal: Vertebral body heights and alignment are maintained. No focal lytic or blastic lesions are present. Small Schmorl's nodes are present. Bony pelvis is within normal limits. The hips are located. Severe degenerative changes are again noted in the left hip. More mild degenerative changes are present on the right. IMPRESSION: 1. Dilated small bowel with a transition point in the anatomic pelvis adjacent to the inflammation is consistent with small bowel obstruction. No discrete bowel injury is evident. 2. The anterior abscess cavity has collapsed. 3. Adjacent small fluid collection measures 2.0 x 2.4 x 1.9 cm. 4. Marked reduction in size of the more inferior pelvic collection, now measuring 3.2 x 6.0 x 3.4 cm. Pigtail drainage catheter remains in place. 5. Reduced inflammatory changes surrounding the sigmoid colon. Electronically Signed   By: Marin Robertshristopher  Mattern M.D.   On: 04/15/2019 15:26   Dg Abd Portable 1v  Result Date: 04/17/2019 CLINICAL DATA:  Small bowel obstruction. EXAM: PORTABLE ABDOMEN - 1 VIEW COMPARISON:  One-view abdomen 04/16/2019 FINDINGS: Dilated loops of small bowel in the upper abdomen is  similar the prior study. More distal loops demonstrate some decompression appear to the most recent x-ray. Surgical drains remain in place. No definite free air is present. Contrast is again noted in the colon. IMPRESSION: 1. Similar dilation of transverse loop of  small bowel in the upper abdomen. 2. The remainder of the small bowel has begun to decompressed. 3. Drainage catheters are stable. Electronically Signed   By: San Morelle M.D.   On: 04/17/2019 10:38   Dg Abd Portable 1v-small Bowel Obstruction Protocol-initial, 8 Hr Delay  Result Date: 04/16/2019 CLINICAL DATA:  Small bowel obstruction. 8 hour delayed film. EXAM: PORTABLE ABDOMEN - 1 VIEW COMPARISON:  CT yesterday. FINDINGS: Enteric contrast in the colon, however unclear if this contrast was from prior CT or administered for small bowel protocol. There is residual contrast in the stomach. Persistent dilated small bowel in the central abdomen a 5.1 cm. Two drainage catheters overlie the pelvis. Advanced left hip osteoarthritis. IMPRESSION: Enteric contrast in the colon, however it is unclear if this is residual contrast that was seen on CT yesterday versus that administered for the small-bowel protocol. There is residual contrast in the stomach and persistent dilated small bowel in the central abdomen. Recommend 24 hour delayed film. Electronically Signed   By: Keith Rake M.D.   On: 04/16/2019 23:43   Dg Abd Portable 1v-small Bowel Protocol-position Verification  Result Date: 04/16/2019 CLINICAL DATA:  NG tube placement. EXAM: PORTABLE ABDOMEN - 1 VIEW COMPARISON:  Abdominal CT 04/15/2019 FINDINGS: Enteric catheter projects over the expected location of gastric cardia. Persistent small bowel loop dilation with maximum diameter of 5.4 cm. No free intra-abdominal gas is seen. IMPRESSION: 1. Enteric catheter projects over the expected location of gastric cardia. 2. Persistent small bowel obstruction. Electronically Signed   By: Fidela Salisbury M.D.   On: 04/16/2019 13:20      Scheduled Meds:  enoxaparin (LOVENOX) injection  40 mg Subcutaneous Q24H   fluticasone  1 spray Each Nare Daily   pantoprazole (PROTONIX) IV  40 mg Intravenous QHS   sodium chloride flush  10 mL Intracatheter Q8H   sodium chloride flush  10-40 mL Intracatheter Q12H   Continuous Infusions:  sodium chloride Stopped (04/05/19 1756)   sodium chloride 10 mL/hr at 04/12/19 2206   sodium chloride 100 mL/hr at 04/17/19 0240   methocarbamol (ROBAXIN) IV 500 mg (04/17/19 0011)   piperacillin-tazobactam (ZOSYN)  IV 3.375 g (04/17/19 0534)   TPN ADULT (ION) 75 mL/hr at 04/16/19 1819   TPN ADULT (ION)       LOS: 12 days    Time spent: 29 minutes   Shaquel Josephson J [place] DO [facility] [email or web address] 04/17/2019, 11:43 AM

## 2019-04-17 NOTE — Progress Notes (Signed)
PHARMACY - ADULT TOTAL PARENTERAL NUTRITION CONSULT NOTE   Pharmacy Consult for TPN Indication: intolerance to enteral feedings, sigmoid diverticulitis, SBO  Patient Measurements: Height: 5\' 4"  (162.6 cm) Weight: 175 lb 11.3 oz (79.7 kg) IBW/kg (Calculated) : 54.7 TPN AdjBW (KG): 61 Body mass index is 30.16 kg/m.  Insulin Requirements: 0 units SSI last 24hr  Current Nutrition: NPO, TPN  IVF: NS at 170ml/hr  Central access:  PICC to be placed 6/9 TPN start date: 6/9  ASSESSMENT                                                                                                          HPI: 53 yo female with sigmoid diverticulitis with abscesses worsened since admission.  IR placed drain 6/9. Patient has been intolerant to enteral feedings for about a week so plan to place PICC line and start TPN per CCS.  Significant events:  6/15: plan for sigmoid colectomy with colostomy today  Today, 04/17/19  Glucose - CBGs at goal < 150, stable  Electrolytes - stable WNL except K 3.7 today (goal >4)  Renal - SCr 0.47 stable  LFTs - WNL  TGs - 91 (6/10), 54 (6/15)  Prealbumin - <5 (6/10), 8.3 (6/15)  NUTRITIONAL GOALS                                                                                             RD recs: Kcal/day: 2000-2240, protein/day: 95-105g  Custom TPN to provide 99g protein per day and 1915 kcal/day at goal rate of 75 ml/hr (96% of estimated needs per RD)  PLAN                                                                                                                         At 1800 today:  Continue custom TPN at goal rate of 75 ml/hr  TPN will contain standard electrolytes except: increase Na at 75, K increased to 90, and max acetate.   TPN to contain standard multivitamins daily and trace elements on MWF due to national shortage  Continue IVF at 161ml/hr  Discontinue CBG/SSI since has been well controlled & not requiring insulin.  TPN lab panels on  Mondays & Thursdays.  Junita PushMichelle Estefano Victory, PharmD, BCPS 04/17/2019 10:40 AM

## 2019-04-17 NOTE — Progress Notes (Signed)
This RN noticed that output from abdominal drain changed colors and consistency from yellow/greenish tan, thick fluid to bile green,thin fluid similar to color coming out of NG tube. Pt states minimal increase in pain, denies tenderness upon palpation. Interventional Radiologist PA notified and he observed color at bedside. MD [place] notified. No new orders placed at this time, Will continue to monitor.

## 2019-04-17 NOTE — Progress Notes (Signed)
Referring Physician(s): Gross,S  Supervising Physician: Irish LackYamagata, Glenn  Patient Status:  [facility]  Chief Complaint: Abdominal/pelvic pain,diverticular abscesses   Subjective: Pt states abd fullness somewhat better since NG placed- output 500 cc; still appears weak, occ dry heave/nausea; no BM; drain sites sl tender; color of ant drain output appears more bilious today, similar to NG   Allergies: Codeine  Medications: Prior to Admission medications   Medication Sig Start Date End Date Taking? Authorizing Provider  acetaminophen (TYLENOL) 325 MG tablet Take 650 mg by mouth every 6 (six) hours as needed (for back pain/pain.).   Yes [provider]  ALPRAZolam (XANAX) 0.25 MG tablet Take 0.25 mg by mouth 2 (two) times daily as needed for anxiety.   Yes [provider]  ASPIRIN ADULT LOW STRENGTH 81 MG EC tablet Take 81 mg by mouth daily. 03/27/19  Yes [provider]  cetirizine (ZYRTEC) 10 MG tablet Take 10 mg by mouth daily.   Yes [provider]  eletriptan (RELPAX) 40 MG tablet Take 40 mg by mouth as needed for migraine or headache. May repeat in 2 hours if headache persists or recurs.   Yes [provider]  fluticasone (FLONASE) 50 MCG/ACT nasal spray Place 1 spray into both nostrils daily.   Yes [provider]  Homeopathic Products [facility](SIMILASAN ALLERGY EYE RELIEF OP) Apply to eye.   Yes [provider]  ibuprofen (ADVIL,MOTRIN) 200 MG tablet Take 600 mg by mouth 2 (two) times daily as needed (for pain.).   Yes [provider]  LACTASE PO Take 5 mg by mouth daily as needed (for milk consumption).   Yes [provider]  meloxicam (MOBIC) 15 MG tablet Take 15 mg by mouth daily as needed for muscle spasms. 03/14/19  Yes [provider]  montelukast (SINGULAIR) 10 MG tablet Take 10 mg by mouth daily at 2 PM. 04/11/17  Yes [provider]  ondansetron (ZOFRAN) 4 MG tablet Take 4 mg by  mouth daily as needed for nausea. 03/27/19  Yes [provider]  traMADol (ULTRAM) 50 MG tablet Take 25-50 mg by mouth 3 (three) times daily as needed (for back pain.).   Yes [provider]  valACYclovir (VALTREX) 500 MG tablet Take 500 mg by mouth 2 (two) times daily as needed. For cold sores 03/24/17  Yes [provider]  oxyCODONE (OXY IR/ROXICODONE) 5 MG immediate release tablet Take 5 mg by mouth every 4 (four) hours as needed for pain. 03/27/19   [provider]     Vital Signs: BP 138/86 (BP Location: Left Arm)    Pulse 100    Temp 97.6 F (36.4 C) (Oral)    Resp (!) 22    Ht 5\' 4"  (1.626 m)    Wt 179 lb 10.8 oz (81.5 kg)    SpO2 98%    BMI 30.84 kg/m   Physical Exam central abd/left TG drains intact, outputs about 30 cc each feculent fluid, sites mildly tender  Imaging: Ct Abdomen Pelvis W Contrast  Result Date: 04/15/2019 CLINICAL DATA:  Severe lower abdominal pain. Unable to eat or drink. History of diverticulitis with recent abscess. Drain in place. EXAM: CT ABDOMEN AND PELVIS WITH CONTRAST TECHNIQUE: Multidetector CT imaging of the abdomen and pelvis was performed using the standard protocol following bolus administration of intravenous contrast. CONTRAST:  100mL OMNIPAQUE IOHEXOL 300 MG/ML SOLN, 30mL OMNIPAQUE IOHEXOL 300 MG/ML SOLN COMPARISON:  Procedures CT 04/10/2019. CT of the abdomen and pelvis  04/09/2019 FINDINGS: Lower chest: The lung bases are clear without focal nodule, mass, or airspace disease. Small pericardial effusion is similar to prior studies. No pleural effusions are present. Hepatobiliary: No focal liver abnormality is seen. No gallstones, gallbladder wall thickening, or biliary dilatation. Distension of the gallbladder is likely secondary to fasting. Pancreas: Unremarkable. No pancreatic ductal dilatation or surrounding inflammatory changes. Spleen: Normal in size without focal abnormality. Adrenals/Urinary Tract: Adrenal glands are  normal bilaterally. Kidneys and ureters are unremarkable. There is no nodule or mass lesion. No stone is present. There is no obstruction. Stomach/Bowel: The stomach is moderately distended and fluid filled. There is marked distension of proximal small bowel loops measuring up to 3.8 cm in transverse diameter. Transition point is in the anatomic pelvis adjacent to the more anterior and superior abscess. The distal small bowel is collapsed. Contrast is present in the colon. Gas and stool are present throughout the colon to the rectum. There is a small amount of contrast in the rectum. Inflammatory changes about the sigmoid colon are improving. Vascular/Lymphatic: No significant vascular calcifications are present. Subcentimeter lymph nodes are likely reactive. Reproductive: Uterus and bilateral adnexa are unremarkable. IUD is in place. Other: Anterior and posterior drainage catheters are stable in position. The anterior collection is collapsed. There is an adjacent collection which may be separate from the drain measuring 2.0 x 2.4 x 1.9 cm. The posterior drain is in place. Previously noted inferior pelvic collection is markedly reduced in size. This collection now measures 3.2 x 6.0 x 3.4 cm. There is still gas with a fluid collection. No other focal fluid collections are present. Musculoskeletal: Vertebral body heights and alignment are maintained. No focal lytic or blastic lesions are present. Small Schmorl's nodes are present. Bony pelvis is within normal limits. The hips are located. Severe degenerative changes are again noted in the left hip. More mild degenerative changes are present on the right. IMPRESSION: 1. Dilated small bowel with a transition point in the anatomic pelvis adjacent to the inflammation is consistent with small bowel obstruction. No discrete bowel injury is evident. 2. The anterior abscess cavity has collapsed. 3. Adjacent small fluid collection measures 2.0 x 2.4 x 1.9 cm. 4. Marked  reduction in size of the more inferior pelvic collection, now measuring 3.2 x 6.0 x 3.4 cm. Pigtail drainage catheter remains in place. 5. Reduced inflammatory changes surrounding the sigmoid colon. Electronically Signed   By: San Morelle M.D.   On: 04/15/2019 15:26   Dg Abd Portable 1v  Result Date: 04/17/2019 CLINICAL DATA:  Small bowel obstruction. EXAM: PORTABLE ABDOMEN - 1 VIEW COMPARISON:  One-view abdomen 04/16/2019 FINDINGS: Dilated loops of small bowel in the upper abdomen is similar the prior study. More distal loops demonstrate some decompression appear to the most recent x-ray. Surgical drains remain in place. No definite free air is present. Contrast is again noted in the colon. IMPRESSION: 1. Similar dilation of transverse loop of small bowel in the upper abdomen. 2. The remainder of the small bowel has begun to decompressed. 3. Drainage catheters are stable. Electronically Signed   By: San Morelle M.D.   On: 04/17/2019 10:38   Dg Abd Portable 1v-small Bowel Obstruction Protocol-initial, 8 Hr Delay  Result Date: 04/16/2019 CLINICAL DATA:  Small bowel obstruction. 8 hour delayed film. EXAM: PORTABLE ABDOMEN - 1 VIEW COMPARISON:  CT yesterday. FINDINGS: Enteric contrast in the colon, however unclear if this contrast was from prior CT or administered for small bowel protocol. There is  residual contrast in the stomach. Persistent dilated small bowel in the central abdomen a 5.1 cm. Two drainage catheters overlie the pelvis. Advanced left hip osteoarthritis. IMPRESSION: Enteric contrast in the colon, however it is unclear if this is residual contrast that was seen on CT yesterday versus that administered for the small-bowel protocol. There is residual contrast in the stomach and persistent dilated small bowel in the central abdomen. Recommend 24 hour delayed film. Electronically Signed   By: Narda RutherfordMelanie  Sanford M.D.   On: 04/16/2019 23:43   Dg Abd Portable 1v-small Bowel  Protocol-position Verification  Result Date: 04/16/2019 CLINICAL DATA:  NG tube placement. EXAM: PORTABLE ABDOMEN - 1 VIEW COMPARISON:  Abdominal CT 04/15/2019 FINDINGS: Enteric catheter projects over the expected location of gastric cardia. Persistent small bowel loop dilation with maximum diameter of 5.4 cm. No free intra-abdominal gas is seen. IMPRESSION: 1. Enteric catheter projects over the expected location of gastric cardia. 2. Persistent small bowel obstruction. Electronically Signed   By: Ted Mcalpineobrinka  Dimitrova M.D.   On: 04/16/2019 13:20    Labs:  CBC: Recent Labs    04/14/19 0311 04/15/19 0321 04/16/19 0323 04/17/19 0430  WBC 9.8 8.9 6.8 7.6  HGB 9.4* 9.8* 9.4* 9.7*  HCT 29.3* 30.0* 29.7* 30.0*  PLT 407* 455* 480* 534*    COAGS: Recent Labs    04/10/19 0456  INR 1.2    BMP: Recent Labs    04/14/19 0311 04/15/19 0321 04/16/19 0323 04/17/19 0430  NA 136 139 140 141  K 4.4 3.9 4.1 3.7  CL 101 102 105 108  CO2 26 27 28 22   GLUCOSE 129* 119* 134* 132*  BUN 16 18 19 14   CALCIUM 8.2* 8.4* 8.3* 8.3*  CREATININE 0.46 0.58 0.53 0.47  GFRNONAA >60 >60 >60 >60  GFRAA >60 >60 >60 >60    LIVER FUNCTION TESTS: Recent Labs    04/04/19 2100 04/11/19 0531 04/12/19 0424 04/16/19 0323  BILITOT 0.8 0.6 0.4 0.3  AST 20 18 19 18   ALT 18 20 20 27   ALKPHOS 101 72 74 72  PROT 7.7 5.6* 5.7* 6.1*  ALBUMIN 4.2 2.4* 2.4* 2.4*    Assessment and Plan: Patient with history of ruptured sigmoid diverticulitis with associated abscesses, status post drain placements x2 on6/9/20; afebrile; WBC nl; hgb 9.7, creat nl; abd film- dilat of TV loop of SB, remainder begun to decompress; CT 6/14 with collapsed ant abd abscess cavity, marked reduction in size of inferior pelvic abscess; further plans as outlined by [facility]- poss to OR 6/17   Electronically Signed: D. Jeananne RamaKevin Clydene Burack, [name] 04/17/2019, 3:48 PM   I spent a total of 15 minutes at the the patient's bedside AND on the patient's  hospital floor or unit, greater than 50% of which was counseling/coordinating care for abd/pelvic abscess drains    Patient ID: Molly Beck, female   DOB: 1966-10-31, 53 y.o.   MRN: [id number]

## 2019-04-18 ENCOUNTER — Encounter (HOSPITAL_COMMUNITY): Payer: Self-pay | Admitting: Anesthesiology

## 2019-04-18 ENCOUNTER — Inpatient Hospital Stay (HOSPITAL_COMMUNITY): Payer: PRIVATE HEALTH INSURANCE

## 2019-04-18 ENCOUNTER — Inpatient Hospital Stay (HOSPITAL_COMMUNITY): Payer: PRIVATE HEALTH INSURANCE | Admitting: Anesthesiology

## 2019-04-18 ENCOUNTER — Encounter (HOSPITAL_COMMUNITY): Admission: EM | Disposition: A | Discharge status: Home / Self Care | Payer: Self-pay | Source: Home / Self Care

## 2019-04-18 DIAGNOSIS — K56609 Unspecified intestinal obstruction, unspecified as to partial versus complete obstruction: Secondary | ICD-10-CM

## 2019-04-18 DIAGNOSIS — L02211 Cutaneous abscess of abdominal wall: Secondary | ICD-10-CM

## 2019-04-18 HISTORY — PX: COLON RESECTION: SHX5231

## 2019-04-18 HISTORY — PX: CYSTOSCOPY WITH STENT PLACEMENT: SHX5790

## 2019-04-18 LAB — CBC
HCT: 30.4 % — ABNORMAL LOW (ref 36.0–46.0)
Hemoglobin: 9.8 g/dL — ABNORMAL LOW (ref 12.0–15.0)
MCH: 30.6 pg (ref 26.0–34.0)
MCHC: 32.2 g/dL (ref 30.0–36.0)
MCV: 95 fL (ref 80.0–100.0)
Platelets: 540 10*3/uL — ABNORMAL HIGH (ref 150–400)
RBC: 3.2 MIL/uL — ABNORMAL LOW (ref 3.87–5.11)
RDW: 13.3 % (ref 11.5–15.5)
WBC: 6.7 10*3/uL (ref 4.0–10.5)
nRBC: 0 % (ref 0.0–0.2)

## 2019-04-18 LAB — BASIC METABOLIC PANEL
Anion gap: 9 (ref 5–15)
BUN: 13 mg/dL (ref 6–20)
CO2: 23 mmol/L (ref 22–32)
Calcium: 8.2 mg/dL — ABNORMAL LOW (ref 8.9–10.3)
Chloride: 108 mmol/L (ref 98–111)
Creatinine, Ser: 0.43 mg/dL — ABNORMAL LOW (ref 0.44–1.00)
GFR calc Af Amer: >60 mL/min (ref >60)
GFR calc non Af Amer: >60 mL/min (ref >60)
Glucose, Bld: 120 mg/dL — ABNORMAL HIGH (ref 70–99)
Potassium: 3.9 mmol/L (ref 3.5–5.1)
Sodium: 140 mmol/L (ref 135–145)

## 2019-04-18 LAB — MAGNESIUM: Magnesium: 2.2 mg/dL (ref 1.7–2.4)

## 2019-04-18 SURGERY — COLON RESECTION
Anesthesia: General | Site: Abdomen

## 2019-04-18 MED ORDER — KETOROLAC TROMETHAMINE 30 MG/ML IJ SOLN
30.0000 mg | Freq: Once | INTRAMUSCULAR | Status: AC
  Administered 2019-04-18: 30 mg via INTRAVENOUS
  Filled 2019-04-18: qty 1

## 2019-04-18 MED ORDER — LIDOCAINE 2% (20 MG/ML) 5 ML SYRINGE
INTRAMUSCULAR | Status: DC | PRN
  Administered 2019-04-18: 1 mg/kg/h via INTRAVENOUS

## 2019-04-18 MED ORDER — FENTANYL CITRATE (PF) 100 MCG/2ML IJ SOLN
INTRAMUSCULAR | Status: AC
  Filled 2019-04-18: qty 2

## 2019-04-18 MED ORDER — LACTATED RINGERS IV SOLN
INTRAVENOUS | Status: DC | PRN
  Administered 2019-04-18: 11:00:00 via INTRAVENOUS

## 2019-04-18 MED ORDER — HYDROMORPHONE HCL 1 MG/ML IJ SOLN
INTRAMUSCULAR | Status: AC
  Filled 2019-04-18: qty 1

## 2019-04-18 MED ORDER — LACTATED RINGERS IV SOLN
INTRAVENOUS | Status: DC | PRN
  Administered 2019-04-18: 09:00:00 via INTRAVENOUS

## 2019-04-18 MED ORDER — LIDOCAINE HCL 2 % IJ SOLN
INTRAMUSCULAR | Status: AC
  Filled 2019-04-18: qty 40

## 2019-04-18 MED ORDER — KETOROLAC TROMETHAMINE 30 MG/ML IJ SOLN
INTRAMUSCULAR | Status: AC
  Filled 2019-04-18: qty 1

## 2019-04-18 MED ORDER — PROMETHAZINE HCL 25 MG/ML IJ SOLN: 6.2500 mg | INTRAMUSCULAR | Status: DC | PRN

## 2019-04-18 MED ORDER — HYDROMORPHONE HCL 1 MG/ML IJ SOLN
0.2500 mg | INTRAMUSCULAR | Status: DC | PRN
  Administered 2019-04-18 (×5): 0.5 mg via INTRAVENOUS

## 2019-04-18 MED ORDER — PROPOFOL 10 MG/ML IV BOLUS
INTRAVENOUS | Status: DC | PRN
  Administered 2019-04-18: 150 mg via INTRAVENOUS

## 2019-04-18 MED ORDER — MIDAZOLAM HCL 5 MG/5ML IJ SOLN
INTRAMUSCULAR | Status: DC | PRN
  Administered 2019-04-18: 2 mg via INTRAVENOUS

## 2019-04-18 MED ORDER — ROCURONIUM BROMIDE 10 MG/ML (PF) SYRINGE
PREFILLED_SYRINGE | INTRAVENOUS | Status: DC | PRN
  Administered 2019-04-18: 10 mg via INTRAVENOUS
  Administered 2019-04-18: 50 mg via INTRAVENOUS
  Administered 2019-04-18: 10 mg via INTRAVENOUS
  Administered 2019-04-18: 20 mg via INTRAVENOUS

## 2019-04-18 MED ORDER — PHENYLEPHRINE HCL (PRESSORS) 10 MG/ML IV SOLN
INTRAVENOUS | Status: AC
  Filled 2019-04-18: qty 1

## 2019-04-18 MED ORDER — KETOROLAC TROMETHAMINE 30 MG/ML IJ SOLN
30.0000 mg | Freq: Once | INTRAMUSCULAR | Status: AC
  Administered 2019-04-18: 30 mg via INTRAVENOUS

## 2019-04-18 MED ORDER — SUGAMMADEX SODIUM 200 MG/2ML IV SOLN
INTRAVENOUS | Status: AC
  Filled 2019-04-18: qty 2

## 2019-04-18 MED ORDER — METHOCARBAMOL 500 MG IVPB - SIMPLE MED
INTRAVENOUS | Status: AC
  Administered 2019-04-18: 500 mg
  Filled 2019-04-18: qty 50

## 2019-04-18 MED ORDER — KETAMINE HCL 10 MG/ML IJ SOLN
INTRAMUSCULAR | Status: AC
  Filled 2019-04-18: qty 1

## 2019-04-18 MED ORDER — LACTATED RINGERS IV SOLN
INTRAVENOUS | Status: DC
  Administered 2019-04-18: 10:00:00 via INTRAVENOUS

## 2019-04-18 MED ORDER — MIDAZOLAM HCL 2 MG/2ML IJ SOLN
INTRAMUSCULAR | Status: AC
  Filled 2019-04-18: qty 2

## 2019-04-18 MED ORDER — SUGAMMADEX SODIUM 200 MG/2ML IV SOLN
INTRAVENOUS | Status: DC | PRN
  Administered 2019-04-18: 200 mg via INTRAVENOUS

## 2019-04-18 MED ORDER — HYDROMORPHONE HCL 1 MG/ML IJ SOLN
INTRAMUSCULAR | Status: AC
  Administered 2019-04-18: 0.5 mg via INTRAVENOUS
  Filled 2019-04-18: qty 1

## 2019-04-18 MED ORDER — MEPERIDINE HCL 50 MG/ML IJ SOLN: 6.2500 mg | INTRAMUSCULAR | Status: DC | PRN

## 2019-04-18 MED ORDER — ALBUMIN HUMAN 5 % IV SOLN
INTRAVENOUS | Status: DC | PRN
  Administered 2019-04-18: 11:00:00 via INTRAVENOUS

## 2019-04-18 MED ORDER — SUCCINYLCHOLINE CHLORIDE 200 MG/10ML IV SOSY
PREFILLED_SYRINGE | INTRAVENOUS | Status: DC | PRN
  Administered 2019-04-18: 100 mg via INTRAVENOUS

## 2019-04-18 MED ORDER — FENTANYL CITRATE (PF) 100 MCG/2ML IJ SOLN
100.0000 ug | Freq: Once | INTRAMUSCULAR | Status: AC
  Administered 2019-04-18: 100 ug via INTRAVENOUS

## 2019-04-18 MED ORDER — HYDROMORPHONE HCL 1 MG/ML IJ SOLN
INTRAMUSCULAR | Status: AC
  Administered 2019-04-18: 14:00:00 0.5 mg via INTRAVENOUS
  Filled 2019-04-18: qty 1

## 2019-04-18 MED ORDER — STERILE WATER FOR IRRIGATION IR SOLN
Status: DC | PRN
  Administered 2019-04-18: 1000 mL

## 2019-04-18 MED ORDER — LIDOCAINE 2% (20 MG/ML) 5 ML SYRINGE
INTRAMUSCULAR | Status: DC | PRN
  Administered 2019-04-18: 100 mg via INTRAVENOUS

## 2019-04-18 MED ORDER — TRAVASOL 10 % IV SOLN
INTRAVENOUS | Status: AC
  Administered 2019-04-18: 18:00:00 via INTRAVENOUS
  Filled 2019-04-18: qty 990

## 2019-04-18 MED ORDER — CHLORHEXIDINE GLUCONATE CLOTH 2 % EX PADS: 6.0000 | MEDICATED_PAD | Freq: Once | CUTANEOUS | Status: DC

## 2019-04-18 MED ORDER — KETOROLAC TROMETHAMINE 15 MG/ML IJ SOLN
15.0000 mg | Freq: Three times a day (TID) | INTRAMUSCULAR | Status: AC | PRN
  Administered 2019-04-19: 15 mg via INTRAVENOUS
  Filled 2019-04-18 (×2): qty 1

## 2019-04-18 MED ORDER — KETAMINE HCL 10 MG/ML IJ SOLN
INTRAMUSCULAR | Status: DC | PRN
  Administered 2019-04-18 (×3): 10 mg via INTRAVENOUS
  Administered 2019-04-18: 20 mg via INTRAVENOUS

## 2019-04-18 MED ORDER — ALBUMIN HUMAN 5 % IV SOLN
INTRAVENOUS | Status: AC
  Filled 2019-04-18: qty 250

## 2019-04-18 MED ORDER — HYDROMORPHONE HCL 1 MG/ML IJ SOLN
0.2500 mg | INTRAMUSCULAR | Status: DC | PRN
  Administered 2019-04-18 (×3): 0.5 mg via INTRAVENOUS

## 2019-04-18 MED ORDER — 0.9 % SODIUM CHLORIDE (POUR BTL) OPTIME
TOPICAL | Status: DC | PRN
  Administered 2019-04-18: 2000 mL

## 2019-04-18 MED ORDER — FENTANYL CITRATE (PF) 100 MCG/2ML IJ SOLN
INTRAMUSCULAR | Status: DC | PRN
  Administered 2019-04-18: 100 ug via INTRAVENOUS
  Administered 2019-04-18 (×2): 50 ug via INTRAVENOUS

## 2019-04-18 MED ORDER — PROPOFOL 10 MG/ML IV BOLUS
INTRAVENOUS | Status: AC
  Filled 2019-04-18: qty 40

## 2019-04-18 MED ORDER — BUPIVACAINE-EPINEPHRINE (PF) 0.25% -1:200000 IJ SOLN
INTRAMUSCULAR | Status: AC
  Filled 2019-04-18: qty 30

## 2019-04-18 MED ORDER — DEXAMETHASONE SODIUM PHOSPHATE 10 MG/ML IJ SOLN
INTRAMUSCULAR | Status: DC | PRN
  Administered 2019-04-18: 10 mg via INTRAVENOUS

## 2019-04-18 MED ORDER — ACETAMINOPHEN 10 MG/ML IV SOLN: 1000.0000 mg | Freq: Once | INTRAVENOUS | Status: DC | PRN

## 2019-04-18 SURGICAL SUPPLY — 77 items
ADH SKN CLS APL DERMABOND .7 (GAUZE/BANDAGES/DRESSINGS) ×2
APL PRP STRL LF DISP 70% ISPRP (MISCELLANEOUS) ×2
BAG URO CATCHER STRL LF (MISCELLANEOUS) ×4 IMPLANT
BLADE EXTENDED COATED 6.5IN (ELECTRODE) IMPLANT
CATH INTERMIT  6FR 70CM (CATHETERS) ×4 IMPLANT
CELLS DAT CNTRL 66122 CELL SVR (MISCELLANEOUS) ×2 IMPLANT
CHLORAPREP W/TINT 26 (MISCELLANEOUS) ×4 IMPLANT
CLOTH BEACON ORANGE TIMEOUT ST (SAFETY) ×4 IMPLANT
COUNTER NEEDLE 20 DBL MAG RED (NEEDLE) ×2 IMPLANT
COVER MAYO STAND STRL (DRAPES) ×12 IMPLANT
COVER SURGICAL LIGHT HANDLE (MISCELLANEOUS) ×2 IMPLANT
COVER WAND RF STERILE (DRAPES) IMPLANT
DECANTER SPIKE VIAL GLASS SM (MISCELLANEOUS) ×4 IMPLANT
DERMABOND ADVANCED (GAUZE/BANDAGES/DRESSINGS) ×2
DERMABOND ADVANCED .7 DNX12 (GAUZE/BANDAGES/DRESSINGS) ×2 IMPLANT
DRAIN CHANNEL 19F RND (DRAIN) ×2 IMPLANT
DRAPE LAPAROSCOPIC ABDOMINAL (DRAPES) ×4 IMPLANT
DRSG OPSITE POSTOP 4X10 (GAUZE/BANDAGES/DRESSINGS) IMPLANT
DRSG OPSITE POSTOP 4X6 (GAUZE/BANDAGES/DRESSINGS) ×2 IMPLANT
DRSG OPSITE POSTOP 4X8 (GAUZE/BANDAGES/DRESSINGS) IMPLANT
ELECT PENCIL ROCKER SW 15FT (MISCELLANEOUS) ×8 IMPLANT
ELECT REM PT RETURN 15FT ADLT (MISCELLANEOUS) ×4 IMPLANT
EVACUATOR 1/8 PVC DRAIN (DRAIN) ×2 IMPLANT
EVACUATOR DRAINAGE 10X20 100CC (DRAIN) IMPLANT
EVACUATOR SILICONE 100CC (DRAIN) ×4 IMPLANT
GAUZE SPONGE 4X4 12PLY STRL (GAUZE/BANDAGES/DRESSINGS) IMPLANT
GLOVE BIO SURGEON STRL SZ 6.5 (GLOVE) ×6 IMPLANT
GLOVE BIO SURGEONS STRL SZ 6.5 (GLOVE) ×2
GLOVE BIOGEL M STRL SZ7.5 (GLOVE) ×4 IMPLANT
GLOVE BIOGEL PI IND STRL 7.0 (GLOVE) ×4 IMPLANT
GLOVE BIOGEL PI INDICATOR 7.0 (GLOVE) ×4
GOWN STRL REUS W/TWL 2XL LVL3 (GOWN DISPOSABLE) ×8 IMPLANT
GOWN STRL REUS W/TWL LRG LVL3 (GOWN DISPOSABLE) ×8 IMPLANT
GOWN STRL REUS W/TWL XL LVL3 (GOWN DISPOSABLE) ×16 IMPLANT
GUIDEWIRE STR DUAL SENSOR (WIRE) ×4 IMPLANT
KIT TURNOVER KIT A (KITS) IMPLANT
LEGGING LITHOTOMY PAIR STRL (DRAPES) IMPLANT
MANIFOLD NEPTUNE II (INSTRUMENTS) ×4 IMPLANT
PACK COLON (CUSTOM PROCEDURE TRAY) ×4 IMPLANT
PACK CYSTO (CUSTOM PROCEDURE TRAY) ×4 IMPLANT
PAD POSITIONING PINK XL (MISCELLANEOUS) ×4 IMPLANT
PROTECTOR NERVE ULNAR (MISCELLANEOUS) ×4 IMPLANT
RELOAD PROXIMATE 75MM BLUE (ENDOMECHANICALS) ×8 IMPLANT
RELOAD STAPLE 75 3.8 BLU REG (ENDOMECHANICALS) IMPLANT
RETRACTOR WND ALEXIS 18 MED (MISCELLANEOUS) IMPLANT
RTRCTR WOUND ALEXIS 18CM MED (MISCELLANEOUS) ×4
SEALER TISSUE G2 STRG ARTC 35C (ENDOMECHANICALS) IMPLANT
SHEET LAVH (DRAPES) ×2 IMPLANT
SPONGE LAP 18X18 RF (DISPOSABLE) ×4 IMPLANT
STAPLER CUT CVD 40MM GREEN (STAPLE) ×2 IMPLANT
STAPLER CUT RELOAD GREEN (STAPLE) ×2 IMPLANT
STAPLER GUN LINEAR PROX 60 (STAPLE) ×2 IMPLANT
STAPLER PROXIMATE 75MM BLUE (STAPLE) ×2 IMPLANT
STAPLER VISISTAT (STAPLE) ×2 IMPLANT
STAPLER VISISTAT 35W (STAPLE) ×4 IMPLANT
STENT URET 6FRX24 CONTOUR (STENTS) ×4 IMPLANT
SURGILUBE 2OZ TUBE FLIPTOP (MISCELLANEOUS) ×4 IMPLANT
SUT ETHILON 2 0 PS N (SUTURE) ×2 IMPLANT
SUT NOVA NAB GS-21 0 18 T12 DT (SUTURE) ×8 IMPLANT
SUT PDS AB 1 CTX 36 (SUTURE) IMPLANT
SUT PDS AB 1 TP1 96 (SUTURE) IMPLANT
SUT PROLENE 2 0 KS (SUTURE) ×4 IMPLANT
SUT PROLENE 2 0 SH DA (SUTURE) ×2 IMPLANT
SUT SILK 2 0 (SUTURE) ×4
SUT SILK 2 0 SH CR/8 (SUTURE) ×4 IMPLANT
SUT SILK 2-0 18XBRD TIE 12 (SUTURE) ×2 IMPLANT
SUT SILK 3 0 (SUTURE) ×4
SUT SILK 3 0 SH CR/8 (SUTURE) ×4 IMPLANT
SUT SILK 3-0 18XBRD TIE 12 (SUTURE) ×2 IMPLANT
SUT VIC AB 2-0 SH 18 (SUTURE) ×4 IMPLANT
SUT VIC AB 4-0 PS2 27 (SUTURE) ×4 IMPLANT
TAPE UMBILICAL COTTON 1/8X30 (MISCELLANEOUS) ×2 IMPLANT
TOWEL OR NON WOVEN STRL DISP B (DISPOSABLE) ×4 IMPLANT
TRAY FOLEY MTR SLVR 16FR STAT (SET/KITS/TRAYS/PACK) ×2 IMPLANT
TUBING CONNECTING 10 (TUBING) ×3 IMPLANT
TUBING CONNECTING 10' (TUBING) ×1
TUBING UROLOGY SET (TUBING) IMPLANT

## 2019-04-18 NOTE — Progress Notes (Signed)
PT Cancellation Note  Patient Details Name: CLOTILDE LOTH MRN: [id number] DOB: 08/27/1966   Cancelled Treatment:    Reason Eval/Treat Not Completed: Patient at procedure or test/unavailable--scheduled for surgery today   Weston Anna, PT Acute Rehabilitation Services Pager: [phone] Office: [phone]

## 2019-04-18 NOTE — Progress Notes (Signed)
PROGRESS NOTE    Molly Beck  ZOX:[id number] DOB: [birth year] DOA: 04/04/2019 PCP: Clementeen GrahamScifres, [name], [name]  Brief Narrative:  Molly Beck a 53 y.o.femalewith medical history significant ofGERD, elective surgery of her right foot a week ago presenting to the hospital for evaluation of abdominal pain.Patient states she had areconstructive right foot surgery done a week ago. States since then she has been constipated due to taking pain medications. Her last bowel movement was prior to the surgery. For the past few days she is having left lower quadrant abdominal pain which became worse for the past 24 hours. The pain is sharp and excruciating. States today she called her surgeon's office and was advised to take magnesium citrate for constipation. She vomited soon after taking magnesium citrate. Denies any fevers. She continues to complain of abdominal pain despite receiving a dose ofmorphine and multiple doses of fentanyl in the ED. CT abdomen pelvis showing acute sigmoid colitis with small foci of adjacent gas that may be extra luminal and may indicate microperforation. No abscess or drainable fluid collection. She was admitted for further treatment and care.  General surgery was consulted.  **Interim History Patient with improved abdominal discomfort overnight following insertion of NG tube with significant output.  Repeat imaging studies suggest continued small bowel obstruction.  Intra-abdominal drains continue to drain well.    Patient is continued on TPN because of lack of progression and because of concern for bilious drainage she is going to be undergoing surgical intervention today and urology was consulted for bilateral stent placement and cystoscopy.  General surgery is currently recommending exploratory laparotomy with possible small bowel resection and possible sigmoid resection with ostomy and possible diverting ostomy.  Assessment & Plan:   Principal Problem:   Perforated sigmoid colon  Active Problems:   Colitis   Sepsis (HCC)   Hypokalemia   H/O foot surgery  Sepsis secondary to Acute Sigmoid Colitis with Microperforation complicated by 2 Absecesses  SBO Sepsis, present on admission -Patient presenting to ED with progressive abdominal pain.  CT abdomen/pelvis notable for acute sigmoid colitis with adjacent gas likely secondary to microperforation.   -Patient was initially started on IV Zosyn.  During her initial hospitalization, her abdominal pain continued not to improve with resultant fevers and elevated white count.  Repeat CT abdomen/pelvis reveals marked worsening in the pelvis with 2 abscesses.  Interventional radiology was consulted and placed two drains on 04/10/2019.  -General surgery/IR following, appreciate assistance -Output past 24h: Right abdomen 280 mL's, buttock 30 mL's -Blood culture 04/05/2019 no growth x5 days -Wound culture 04/10/2019: Abundant mixed organisms, none predominant -Continue IV Zosyn for now -NGT placement 04/16/2019 per general surgery; C/w LIWS -TPN, NS at 100 mL/hr given intolerance to diet -Continue to monitor drain output and BMs -Because of minimal improvement and now with green bile color in anterior abdominal drain, General Surgery recommending exploratory laparotomy with possible small bowel resection and possible sigmoid resection with ostomy and possible diverting ostomy  -Urology Consulted by General Surgery for Bilateral Renal Stents and Cystoscopy  -Last SARS-CoV-2 testing was negative on 04/05/2019 however this was repeated this morning -Currently getting TPN -KUB this AM showed "Improving small bowel obstruction. Contrast in the colon now reaches the rectum. Stable percutaneous drains in the pelvis."  -Continue supportive care and antiemetics  Rash -Maculopapular rash bilateral distribution on her back.  -Could be due to heat/sweat rash or contact from bedsheets.   -She has tolerated IV Zosyn for the  past week without  any signs of allergic reaction.  This does not appear to be an reaction to Zosyn at this time.   -Continue IV Benadryl and hydrocortisone cream prn.  -Continue hygiene and change of bed sheets daily  Recent right foot surgery and Tendon Repair -Currently in a cast, plans to follow-up with her orthopedic surgeon as an outpatient -PT following -Continue nonweightbearing right lower extremity  Hypokalemia -K+ was 3.9 this AM -Continue to Monitor and Replete as Necessary -Repeat CMP in AM  Normocytic Anmeia -Patient's Hgb/Hct was 9.8/30.4 -Check Anemia Panel in the AM -Continue to Monitor for S/Sx of Bleeding; Currently no overt Bleeding  -Repeat CBC in AM   Hyperglycemia -Likely reactive and in the setting of TPN; CBG's ranging from 100-118 -HbA1c was 4.9 on 04/14/2019 -CBG's ranging from   GERD -Continue with PPI IV with Pantoprazole 40 mg q24h  Anxiety -Continue with Lorazepam 0.5 mg IV every 6 as needed for Anxiety  Obesity -Estimated body mass index is 30.84 kg/m as calculated from the following:   Height as of this encounter: 5\' 4"  (1.626 m).   Weight as of this encounter: 81.5 kg. -Weight Loss and Dietary Counseling given   DVT prophylaxis: Enoxaparin 40 mg sq q24h Code Status: FULL CODE  Family Communication: No family present at bedside  Disposition Plan: Remain Inpatient as patient is going for Surgical Intervention    Consultants:   Interventional Radiology  General Surgery  Orthopedic Surgery   Urology   Procedures  IR drain placement x 2 04/10/2019  PICC RUE 04/10/2019  Surgical Intervention today and Cystoscopy   Antimicrobials:  Anti-infectives (From admission, onward)   Start     Dose/Rate Route Frequency Ordered Stop   04/16/19 0600  cefoTEtan (CEFOTAN) 2 g in sodium chloride 0.9 % 100 mL IVPB     2 g 200 mL/hr over 30 Minutes Intravenous On call to O.R. 04/15/19 1505 04/17/19 0559   04/05/19 0600  piperacillin-tazobactam  (ZOSYN) IVPB 3.375 g     3.375 g 12.5 mL/hr over 240 Minutes Intravenous Every 8 hours 04/05/19 0121     04/05/19 0000  piperacillin-tazobactam (ZOSYN) IVPB 3.375 g     3.375 g 100 mL/hr over 30 Minutes Intravenous  Once 04/04/19 2355 04/05/19 0119     Subjective: Seen and examined at bedside she was frustrated this morning because of her lack of progression and was about to go for surgical intervention.  No nausea or vomiting.  And still had NG tube with good output.  Abdominal drain with bilious colored fluid.  No other concerns or complaints at this time and understands she will warrant for surgical intervention later today.  Objective: Vitals:   04/17/19 1730 04/17/19 1957 04/18/19 0028 04/18/19 0454  BP: (!) 147/100 (!) 142/87 (!) 137/99 123/85  Pulse: 95 90 85 (!) 102  Resp: 19 20 14 14   Temp: 98.6 F (37 C) 98.6 F (37 C) 98.1 F (36.7 C) 98.9 F (37.2 C)  TempSrc: Oral Oral Oral Oral  SpO2: 100% 97% 99% 96%  Weight:      Height:        Intake/Output Summary (Last 24 hours) at 04/18/2019 0746 Last data filed at 04/18/2019 0200 Gross per 24 hour  Intake 3662.09 ml  Output 510 ml  Net 3152.09 ml   Filed Weights   04/10/19 1059 04/16/19 1700 04/17/19 1050  Weight: 78.9 kg 79.7 kg 81.5 kg   Examination: Physical Exam:  Constitutional: WN/WD slightly obese Caucasian female currently who  is frustrated and appears uncomfortable  Eyes: Lids and conjunctivae normal, sclerae anicteric  ENMT: External Ears, Nose appear normal. Grossly normal hearing. NGT in place hooked to suction Neck: Appears normal, supple, no cervical masses, normal ROM, no appreciable thyromegaly; no JVD Respiratory: Diminished to auscultation bilaterally, no wheezing, rales, rhonchi or crackles. Normal respiratory effort and patient is not tachypenic. No accessory muscle use.  Cardiovascular: RRR, no murmurs / rubs / gallops. S1 and S2 auscultated. Abdomen: Soft, mildly tender, non-distended. Has  anterior abdominal drain and one posterior. Bowel sounds diminished   GU: Deferred. Musculoskeletal: Right foot in cast Skin: No rashes, lesions, ulcers on a limited skin evaluation. No induration; Warm and dry.  Neurologic: CN 2-12 grossly intact with no focal deficits. Romberg sign and cerebellar reflexes not assessed.  Psychiatric: Normal judgment and insight. Alert and oriented x 3. Frustrated mood and depressed appearing. Slightly flat affect.   Data Reviewed: I have personally reviewed following labs and imaging studies  CBC: Recent Labs  Lab 04/14/19 0311 04/15/19 0321 04/16/19 0323 04/17/19 0430 04/18/19 0324  WBC 9.8 8.9 6.8 7.6 6.7  NEUTROABS  --   --  5.2  --   --   HGB 9.4* 9.8* 9.4* 9.7* 9.8*  HCT 29.3* 30.0* 29.7* 30.0* 30.4*  MCV 94.2 94.3 95.2 95.2 95.0  PLT 407* 455* 480* 534* 540*   Basic Metabolic Panel: Recent Labs  Lab 04/12/19 0424 04/13/19 0327 04/14/19 0311 04/15/19 0321 04/16/19 0323 04/17/19 0430 04/18/19 0324  NA 136 135 136 139 140 141 140  K 3.2* 4.1 4.4 3.9 4.1 3.7 3.9  CL 100 103 101 102 105 108 108  CO2 26 25 26 27 28 22 23   GLUCOSE 116* 128* 129* 119* 134* 132* 120*  BUN 7 11 16 18 19 14 13   CREATININE 0.31* 0.39* 0.46 0.58 0.53 0.47 0.43*  CALCIUM 7.5* 7.8* 8.2* 8.4* 8.3* 8.3* 8.2*  MG 2.1 2.2 2.3  --  2.2 2.2 2.2  PHOS 3.0 4.0  --   --  4.0  --   --    GFR: Estimated Creatinine Clearance: 84.9 mL/min (A) (by C-G formula based on SCr of 0.43 mg/dL (L)). Liver Function Tests: Recent Labs  Lab 04/12/19 0424 04/16/19 0323  AST 19 18  ALT 20 27  ALKPHOS 74 72  BILITOT 0.4 0.3  PROT 5.7* 6.1*  ALBUMIN 2.4* 2.4*   No results for input(s): LIPASE, AMYLASE in the last 168 hours. No results for input(s): AMMONIA in the last 168 hours. Coagulation Profile: No results for input(s): INR, PROTIME in the last 168 hours. Cardiac Enzymes: No results for input(s): CKTOTAL, CKMB, CKMBINDEX, TROPONINI in the last 168 hours. BNP (last 3  results) No results for input(s): PROBNP in the last 8760 hours. HbA1C: No results for input(s): HGBA1C in the last 72 hours. CBG: Recent Labs  Lab 04/16/19 1102 04/16/19 1632 04/17/19 0657 04/17/19 1101 04/17/19 1613  GLUCAP 119* 109* 118* 100* 114*   Lipid Profile: Recent Labs    04/16/19 0323  TRIG 54   Thyroid Function Tests: No results for input(s): TSH, T4TOTAL, FREET4, T3FREE, THYROIDAB in the last 72 hours. Anemia Panel: No results for input(s): VITAMINB12, FOLATE, FERRITIN, TIBC, IRON, RETICCTPCT in the last 72 hours. Sepsis Labs: No results for input(s): PROCALCITON, LATICACIDVEN in the last 168 hours.  Recent Results (from the past 240 hour(s))  Aerobic/Anaerobic Culture (surgical/deep wound)     Status: None   Collection Time: 04/10/19  2:05 PM  Specimen: Abscess  Result Value Ref Range Status   Specimen Description   Final    ABSCESS DIVERTICULAR Performed at [facility] [address], [place], [place] [address]    Special Requests   Final    Normal Performed at [facility], [place] [address] [place], [place] [address]    Gram Stain   Final    ABUNDANT WBC PRESENT, PREDOMINANTLY PMN ABUNDANT GRAM NEGATIVE RODS ABUNDANT GRAM POSITIVE COCCI FEW GRAM VARIABLE ROD Performed at [facility], [place] [address], [place], [place] [address]    Culture   Final    ABUNDANT MULTIPLE ORGANISMS PRESENT, NONE PREDOMINANT ABUNDANT MIXED ANAEROBIC FLORA PRESENT.  CALL LAB IF FURTHER IID REQUIRED.    Report Status 04/13/2019 FINAL  Final  Aerobic/Anaerobic Culture (surgical/deep wound)     Status: None   Collection Time: 04/10/19  2:06 PM   Specimen: Abscess  Result Value Ref Range Status   Specimen Description   Final    ABSCESS PELVIC Performed at [facility] [address], [place], [place] [address]    Special Requests   Final    Normal Performed at [facility], [place]  [address], [place], [place] [address]    Gram Stain   Final    ABUNDANT WBC PRESENT,BOTH PMN AND MONONUCLEAR ABUNDANT GRAM NEGATIVE RODS MODERATE GRAM POSITIVE COCCI MODERATE GRAM VARIABLE ROD Performed at [facility], [place] [address], [place], [place] [address]    Culture   Final    ABUNDANT MULTIPLE ORGANISMS PRESENT, NONE PREDOMINANT ABUNDANT MIXED ANAEROBIC FLORA PRESENT.  CALL LAB IF FURTHER IID REQUIRED.    Report Status 04/13/2019 FINAL  Final    Radiology Studies: Dg Abd Portable 1v  Result Date: 04/17/2019 CLINICAL DATA:  Small bowel obstruction. EXAM: PORTABLE ABDOMEN - 1 VIEW COMPARISON:  One-view abdomen 04/16/2019 FINDINGS: Dilated loops of small bowel in the upper abdomen is similar the prior study. More distal loops demonstrate some decompression appear to the most recent x-ray. Surgical drains remain in place. No definite free air is present. Contrast is again noted in the colon. IMPRESSION: 1. Similar dilation of transverse loop of small bowel in the upper abdomen. 2. The remainder of the small bowel has begun to decompressed. 3. Drainage catheters are stable. Electronically Signed   By: San Morelle M.D.   On: 04/17/2019 10:38   Dg Abd Portable 1v-small Bowel Obstruction Protocol-initial, 8 Hr Delay  Result Date: 04/16/2019 CLINICAL DATA:  Small bowel obstruction. 8 hour delayed film. EXAM: PORTABLE ABDOMEN - 1 VIEW COMPARISON:  CT yesterday. FINDINGS: Enteric contrast in the colon, however unclear if this contrast was from prior CT or administered for small bowel protocol. There is residual contrast in the stomach. Persistent dilated small bowel in the central abdomen a 5.1 cm. Two drainage catheters overlie the pelvis. Advanced left hip osteoarthritis. IMPRESSION: Enteric contrast in the colon, however it is unclear if this is residual contrast that was seen on CT yesterday versus that administered for the small-bowel protocol. There is residual contrast in the  stomach and persistent dilated small bowel in the central abdomen. Recommend 24 hour delayed film. Electronically Signed   By: Keith Rake M.D.   On: 04/16/2019 23:43   Dg Abd Portable 1v-small Bowel Protocol-position Verification  Result Date: 04/16/2019 CLINICAL DATA:  NG tube placement. EXAM: PORTABLE ABDOMEN - 1 VIEW COMPARISON:  Abdominal CT 04/15/2019 FINDINGS: Enteric catheter projects over the expected  location of gastric cardia. Persistent small bowel loop dilation with maximum diameter of 5.4 cm. No free intra-abdominal gas is seen. IMPRESSION: 1. Enteric catheter projects over the expected location of gastric cardia. 2. Persistent small bowel obstruction. Electronically Signed   By: Ted Mcalpine M.D.   On: 04/16/2019 13:20   Scheduled Meds:  enoxaparin (LOVENOX) injection  40 mg Subcutaneous Q24H   fluticasone  1 spray Each Nare Daily   pantoprazole (PROTONIX) IV  40 mg Intravenous QHS   sodium chloride flush  10 mL Intracatheter Q8H   sodium chloride flush  10-40 mL Intracatheter Q12H   Continuous Infusions:  sodium chloride Stopped (04/05/19 1756)   sodium chloride Stopped (04/17/19 1449)   sodium chloride 100 mL/hr at 04/18/19 0200   methocarbamol (ROBAXIN) IV 500 mg (04/17/19 0011)   piperacillin-tazobactam (ZOSYN)  IV 3.375 g (04/18/19 0543)   TPN ADULT (ION) 75 mL/hr at 04/18/19 0200    LOS: 13 days   Merlene Laughter, DO [facility] PAGER is on AMION  If 7PM-7AM, please contact night-coverage [email or web address] Password [facility] 04/18/2019, 7:46 AM

## 2019-04-18 NOTE — H&P (View-Only) (Signed)
Urology Consult  Referring physician: Dr Clovis PuA Thomas Reason for referral: Abdominal abscess  Chief Complaint:  Abdominal abscess  History of Present Illness:  The patient likely has a perforated diverticular abscess.   Urology was consulted for bilateral stents.  The CT scan was within normal limits in terms of her ureters and kidneys.  No GU surgery; no stones Occasional UTI normal LUTS  Modifying factors: There are no other modifying factors  Associated signs and symptoms: There are no other associated signs and symptoms Aggravating and relieving factors: There are no other aggravating or relieving factors Severity: Moderate Duration: Persistent    Past Medical History:  Diagnosis Date  . Allergy   . Anxiety   . GERD (gastroesophageal reflux disease)   . Headache    migraines  . Heartburn   . OA (osteoarthritis)    T/O  . Recurrent cold sores   . Tendon laceration    right  small finger  . Wears glasses    Past Surgical History:  Procedure Laterality Date  . I&D EXTREMITY Right 05/31/2017   Procedure: Right small finger irrigation and debridement and flexor tendon reconstruction;  Surgeon: Dominica SeverinGramig, William, MD;  Location: [facility] OR;  Service: Orthopedics;  Laterality: Right;  . KNEE ARTHROSCOPY     B/L  . REPLACEMENT TOTAL KNEE     B/L  . TENDON REPAIR     left thumb  . WISDOM TOOTH EXTRACTION      Medications: I have reviewed the patient's current medications. Allergies:  Allergies  Allergen Reactions  . Codeine Nausea Only    Family History  Problem Relation Age of Onset  . Macular degeneration Mother   . Pancreatic cancer Father   . Other Sister    Social History:  reports that she has quit smoking. She has never used smokeless tobacco. She reports current alcohol use. She reports that she does not use drugs.  ROS: All systems are reviewed and negative except as noted. Rest negative  Physical Exam:  Vital signs in last 24 hours: Temp:  [97.6 F (36.4  C)-98.9 F (37.2 C)] 98.9 F (37.2 C) (06/17 0454) Pulse Rate:  [85-102] 102 (06/17 0454) Resp:  [14-22] 14 (06/17 0454) BP: (123-147)/(85-100) 123/85 (06/17 0454) SpO2:  [96 %-100 %] 96 % (06/17 0454) Weight:  [81.5 kg] 81.5 kg (06/16 1050)  Cardiovascular: Skin warm; not flushed Respiratory: Breaths quiet; no shortness of breath Abdomen: No masses Neurological: Normal sensation to touch Musculoskeletal: Normal motor function arms and legs Lymphatics: No inguinal adenopathy Skin: No rashes Genitourinary:no acute distress  Laboratory Data:  Results for orders placed or performed during the hospital encounter of 04/04/19 (from the past 72 hour(s))  Glucose, capillary     Status: Abnormal   Collection Time: 04/15/19 11:49 AM  Result Value Ref Range   Glucose-Capillary 111 (H) 70 - 99 mg/dL  Glucose, capillary     Status: None   Collection Time: 04/15/19  4:14 PM  Result Value Ref Range   Glucose-Capillary 97 70 - 99 mg/dL  Comprehensive metabolic panel     Status: Abnormal   Collection Time: 04/16/19  3:23 AM  Result Value Ref Range   Sodium 140 135 - 145 mmol/L   Potassium 4.1 3.5 - 5.1 mmol/L   Chloride 105 98 - 111 mmol/L   CO2 28 22 - 32 mmol/L   Glucose, Bld 134 (H) 70 - 99 mg/dL   BUN 19 6 - 20 mg/dL   Creatinine, Ser 1.610.53 0.44 -  1.00 mg/dL   Calcium 8.3 (L) 8.9 - 10.3 mg/dL   Total Protein 6.1 (L) 6.5 - 8.1 g/dL   Albumin 2.4 (L) 3.5 - 5.0 g/dL   AST 18 15 - 41 U/L   ALT 27 0 - 44 U/L   Alkaline Phosphatase 72 38 - 126 U/L   Total Bilirubin 0.3 0.3 - 1.2 mg/dL   GFR calc non Af Amer >60 >60 mL/min   GFR calc Af Amer >60 >60 mL/min   Anion gap 7 5 - 15    Comment: Performed at [facility], [address] [place], [place] [address]  Magnesium     Status: None   Collection Time: 04/16/19  3:23 AM  Result Value Ref Range   Magnesium 2.2 1.7 - 2.4 mg/dL    Comment: Performed at [facility], [address]  [place], [place] [address]  Phosphorus     Status: None   Collection Time: 04/16/19  3:23 AM  Result Value Ref Range   Phosphorus 4.0 2.5 - 4.6 mg/dL    Comment: Performed at [facility], [address] [place], [place] [address]  CBC     Status: Abnormal   Collection Time: 04/16/19  3:23 AM  Result Value Ref Range   WBC 6.8 4.0 - 10.5 K/uL   RBC 3.12 (L) 3.87 - 5.11 MIL/uL   Hemoglobin 9.4 (L) 12.0 - 15.0 g/dL   HCT 40.329.7 (L) 47.436.0 - 25.946.0 %   MCV 95.2 80.0 - 100.0 fL   MCH 30.1 26.0 - 34.0 pg   MCHC 31.6 30.0 - 36.0 g/dL   RDW 56.314.1 87.511.5 - 64.315.5 %   Platelets 480 (H) 150 - 400 K/uL   nRBC 0.0 0.0 - 0.2 %    Comment: Performed at [facility], [address] [place], [place] [address]  Differential     Status: None   Collection Time: 04/16/19  3:23 AM  Result Value Ref Range   Neutrophils Relative % 78 %   Neutro Abs 5.2 1.7 - 7.7 K/uL   Lymphocytes Relative 11 %   Lymphs Abs 0.8 0.7 - 4.0 K/uL   Monocytes Relative 8 %   Monocytes Absolute 0.5 0.1 - 1.0 K/uL   Eosinophils Relative 3 %   Eosinophils Absolute 0.2 0.0 - 0.5 K/uL   Basophils Relative 0 %   Basophils Absolute 0.0 0.0 - 0.1 K/uL   Immature Granulocytes 0 %   Abs Immature Granulocytes 0.03 0.00 - 0.07 K/uL    Comment: Performed at [facility], [address] [place], [place] [address]  Triglycerides     Status: None   Collection Time: 04/16/19  3:23 AM  Result Value Ref Range   Triglycerides 54 <150 mg/dL    Comment: Performed at [facility], [address] [place], [place] [address]  Prealbumin     Status: Abnormal   Collection Time: 04/16/19  3:23 AM  Result Value Ref Range   Prealbumin 8.3 (L) 18 - 38 mg/dL    Comment: Performed at [facility], [address] [place], [place] [address]  Glucose, capillary     Status: Abnormal   Collection Time: 04/16/19  7:45 AM  Result Value Ref Range   Glucose-Capillary 116 (H)  70 - 99 mg/dL  Glucose, capillary     Status: Abnormal   Collection Time: 04/16/19 11:02 AM  Result Value Ref Range   Glucose-Capillary  119 (H) 70 - 99 mg/dL  Glucose, capillary     Status: Abnormal   Collection Time: 04/16/19  4:32 PM  Result Value Ref Range   Glucose-Capillary 109 (H) 70 - 99 mg/dL  CBC     Status: Abnormal   Collection Time: 04/17/19  4:30 AM  Result Value Ref Range   WBC 7.6 4.0 - 10.5 K/uL   RBC 3.15 (L) 3.87 - 5.11 MIL/uL   Hemoglobin 9.7 (L) 12.0 - 15.0 g/dL   HCT 30.0 (L) 36.0 - 46.0 %   MCV 95.2 80.0 - 100.0 fL   MCH 30.8 26.0 - 34.0 pg   MCHC 32.3 30.0 - 36.0 g/dL   RDW 13.6 11.5 - 15.5 %   Platelets 534 (H) 150 - 400 K/uL   nRBC 0.0 0.0 - 0.2 %    Comment: Performed at [facility], [place] [address] [place], [place] [address]  Basic metabolic panel     Status: Abnormal   Collection Time: 04/17/19  4:30 AM  Result Value Ref Range   Sodium 141 135 - 145 mmol/L   Potassium 3.7 3.5 - 5.1 mmol/L   Chloride 108 98 - 111 mmol/L   CO2 22 22 - 32 mmol/L   Glucose, Bld 132 (H) 70 - 99 mg/dL   BUN 14 6 - 20 mg/dL   Creatinine, Ser 0.47 0.44 - 1.00 mg/dL   Calcium 8.3 (L) 8.9 - 10.3 mg/dL   GFR calc non Af Amer >60 >60 mL/min   GFR calc Af Amer >60 >60 mL/min   Anion gap 11 5 - 15    Comment: Performed at [facility], [place] [address], [place], [place] [address]  Magnesium     Status: None   Collection Time: 04/17/19  4:30 AM  Result Value Ref Range   Magnesium 2.2 1.7 - 2.4 mg/dL    Comment: Performed at [facility], [place] [address] [place], [place] [address]  Glucose, capillary     Status: Abnormal   Collection Time: 04/17/19  6:57 AM  Result Value Ref Range   Glucose-Capillary 118 (H) 70 - 99 mg/dL  Glucose, capillary     Status: Abnormal   Collection Time: 04/17/19 11:01 AM  Result Value Ref Range   Glucose-Capillary 100 (H) 70 - 99 mg/dL  Glucose, capillary     Status: Abnormal    Collection Time: 04/17/19  4:13 PM  Result Value Ref Range   Glucose-Capillary 114 (H) 70 - 99 mg/dL  CBC     Status: Abnormal   Collection Time: 04/18/19  3:24 AM  Result Value Ref Range   WBC 6.7 4.0 - 10.5 K/uL   RBC 3.20 (L) 3.87 - 5.11 MIL/uL   Hemoglobin 9.8 (L) 12.0 - 15.0 g/dL   HCT 30.4 (L) 36.0 - 46.0 %   MCV 95.0 80.0 - 100.0 fL   MCH 30.6 26.0 - 34.0 pg   MCHC 32.2 30.0 - 36.0 g/dL   RDW 13.3 11.5 - 15.5 %   Platelets 540 (H) 150 - 400 K/uL   nRBC 0.0 0.0 - 0.2 %    Comment: Performed at [facility], [place] [address] [place], [place] [address]  Basic metabolic panel     Status: Abnormal   Collection Time: 04/18/19  3:24 AM  Result Value Ref Range   Sodium 140 135 - 145 mmol/L   Potassium 3.9 3.5 - 5.1 mmol/L   Chloride 108 98 -  111 mmol/L   CO2 23 22 - 32 mmol/L   Glucose, Bld 120 (H) 70 - 99 mg/dL   BUN 13 6 - 20 mg/dL   Creatinine, Ser 0.43 (L) 0.44 - 1.00 mg/dL   Calcium 8.2 (L) 8.9 - 10.3 mg/dL   GFR calc non Af Amer >60 >60 mL/min   GFR calc Af Amer >60 >60 mL/min   Anion gap 9 5 - 15    Comment: Performed at [facility], [address] [place], [place] [address]  Magnesium     Status: None   Collection Time: 04/18/19  3:24 AM  Result Value Ref Range   Magnesium 2.2 1.7 - 2.4 mg/dL    Comment: Performed at [facility], [address] [place], [place] [address]   Recent Results (from the past 240 hour(s))  Aerobic/Anaerobic Culture (surgical/deep wound)     Status: None   Collection Time: 04/10/19  2:05 PM   Specimen: Abscess  Result Value Ref Range Status   Specimen Description   Final    ABSCESS DIVERTICULAR Performed at [facility], [address] [place], [place] [address]    Special Requests   Final    Normal Performed at [facility], [address] [place], [place] [address]    Gram Stain   Final    ABUNDANT WBC PRESENT, PREDOMINANTLY  PMN ABUNDANT GRAM NEGATIVE RODS ABUNDANT GRAM POSITIVE COCCI FEW GRAM VARIABLE ROD Performed at [facility], [address] [place], [place] [address]    Culture   Final    ABUNDANT MULTIPLE ORGANISMS PRESENT, NONE PREDOMINANT ABUNDANT MIXED ANAEROBIC FLORA PRESENT.  CALL LAB IF FURTHER IID REQUIRED.    Report Status 04/13/2019 FINAL  Final  Aerobic/Anaerobic Culture (surgical/deep wound)     Status: None   Collection Time: 04/10/19  2:06 PM   Specimen: Abscess  Result Value Ref Range Status   Specimen Description   Final    ABSCESS PELVIC Performed at [facility], [address] [place], [place] [address]    Special Requests   Final    Normal Performed at [facility], [address] [place], [place] [address]    Gram Stain   Final    ABUNDANT WBC PRESENT,BOTH PMN AND MONONUCLEAR ABUNDANT GRAM NEGATIVE RODS MODERATE GRAM POSITIVE COCCI MODERATE GRAM VARIABLE ROD Performed at [facility], [address] [place],  [address]    Culture   Final    ABUNDANT MULTIPLE ORGANISMS PRESENT, NONE PREDOMINANT ABUNDANT MIXED ANAEROBIC FLORA PRESENT.  CALL LAB IF FURTHER IID REQUIRED.    Report Status 04/13/2019 FINAL  Final   Creatinine: Recent Labs    04/12/19 0424 04/13/19 0327 04/14/19 0311 04/15/19 0321 04/16/19 0323 04/17/19 0430 04/18/19 0324  CREATININE 0.31* 0.39* 0.46 0.58 0.53 0.47 0.43*    Xrays: See report/chart Reviewed; no hydro  Impression/Assessment:  Bilateral stents and retros and cysto discussed Pros, cons, risk, and sequelae discussed Remove in office  Plan:  As noted picture drawn  Harim Bi A Emilyanne Mcgough 04/18/2019, 8:26 AM     

## 2019-04-18 NOTE — Anesthesia Procedure Notes (Signed)
Procedure Name: Intubation Date/Time: 04/18/2019 10:57 AM Performed by: Lavina Hamman, CRNA Pre-anesthesia Checklist: Patient identified, Emergency Drugs available, Suction available, Patient being monitored and Timeout performed Patient Re-evaluated:Patient Re-evaluated prior to induction Oxygen Delivery Method: Circle system utilized Preoxygenation: Pre-oxygenation with 100% oxygen Induction Type: IV induction, Rapid sequence and Cricoid Pressure applied Laryngoscope Size: Mac and 3 Grade View: Grade I Tube type: Oral Tube size: 7.5 mm Number of attempts: 1 Airway Equipment and Method: Stylet Placement Confirmation: ETT inserted through vocal cords under direct vision,  positive ETCO2,  CO2 detector and breath sounds checked- equal and bilateral Secured at: 22 cm Tube secured with: Tape Dental Injury: Teeth and Oropharynx as per pre-operative assessment

## 2019-04-18 NOTE — Anesthesia Postprocedure Evaluation (Signed)
Anesthesia Post Note  Patient: Molly Beck  Procedure(s) Performed: EXPLORATORY LAPAROTOMY, HARTMAN'S RESECTION AND SMALL BOWEL RESECTION WITH COLOSTOMY (N/A Abdomen) CYSTOSCOPY WITH STENT PLACEMENT (Bilateral )     Patient location during evaluation: PACU Anesthesia Type: General Level of consciousness: sedated Pain management: pain level controlled Vital Signs Assessment: post-procedure vital signs reviewed and stable Respiratory status: spontaneous breathing Cardiovascular status: stable Postop Assessment: no apparent nausea or vomiting Anesthetic complications: no    Last Vitals:  Vitals:   04/18/19 1530 04/18/19 1545  BP: 118/84 122/87  Pulse: (!) 123 (!) 118  Resp: 14 10  Temp:  36.7 C  SpO2: 95% 94%    Last Pain:  Vitals:   04/18/19 1545  TempSrc:   PainSc: Asleep   Pain Goal: Patients Stated Pain Goal: 2 (04/18/19 0839)                 Huston Foley

## 2019-04-18 NOTE — Transfer of Care (Signed)
Immediate Anesthesia Transfer of Care Note  Patient: NUSAIBA GUALLPA  Procedure(s) Performed: Procedure(s): EXPLORATORY LAPAROTOMY, HARTMAN'S RESECTION AND SMALL BOWEL RESECTION WITH COLOSTOMY (N/A) CYSTOSCOPY WITH STENT PLACEMENT (Bilateral)  Patient Location: PACU  Anesthesia Type:General  Level of Consciousness:  sedated, patient cooperative and responds to stimulation  Airway & Oxygen Therapy:Patient Spontanous Breathing and Patient connected to face mask oxgen  Post-op Assessment:  Report given to PACU RN and Post -op Vital signs reviewed and stable  Post vital signs:  Reviewed and stable  Last Vitals:  Vitals:   04/18/19 0454 04/18/19 0945  BP: 123/85 (!) 108/94  Pulse: (!) 102 97  Resp: 14 17  Temp: 37.2 C 36.8 C  SpO2: 44% 034%    Complications: No apparent anesthesia complications

## 2019-04-18 NOTE — Op Note (Signed)
04/04/2019 - 04/18/2019  1:08 PM  PATIENT:  Molly Beck  53 y.o. female  Patient Care Team: Scifres, Peter Miniumorothy, [name] as PCP - General (Physician Assistant)  PRE-OPERATIVE DIAGNOSIS:  SMALL BOWEL OBSTRUCTION, PERFORATED DIVERTICULITIS  POST-OPERATIVE DIAGNOSIS:  PERFORATION OF SMALL BOWEL BY CT GUIDED DRAIN, PERFORATED DIVERTICULITIS  PROCEDURE:  EXPLORATORY LAPAROTOMY, HARTMAN'S PROCEDURE, SMALL BOWEL RESECTION CYSTOSCOPY WITH STENT PLACEMENT   Surgeon(s): Romie Leveehomas, Brixton Franko, MD Alfredo MartinezMacDiarmid, Scott, MD  ASSISTANT: Leary RocaMichael Maczis PA  ANESTHESIA:   general  EBL: 250ml Total I/O In: 1950 [I.V.:1700; IV Piggyback:250] Out: 250 [Blood:250]  DRAINS: (69F Blake drain) Jackson-Pratt drain(s) with closed bulb suction in the pelvis   SPECIMEN:  Source of Specimen:  Sigmoid colon  DISPOSITION OF SPECIMEN:  PATHOLOGY  COUNTS:  YES  PLAN OF CARE: Pt already admitted  PATIENT DISPOSITION:  PACU - hemodynamically stable.  INDICATION: 53 year old female with perforated diverticulitis status post CT-guided drain.  Her symptoms did not improve.  She developed a bowel obstruction that did not improve with NG decompression.  It was decided to perform an exploratory laparotomy.   OR FINDINGS: CT-guided drain in the small bowel.  Other CT-guided drain in the pelvis draining a purulent fluid collection contaminated with stool.  Distal sigmoid diverticulitis.  Small bowel obstruction due to inflammation of small bowel.  DESCRIPTION: the patient was identified in the preoperative holding area and taken to the OR where they were laid supine on the operating room table.  General anesthesia was induced without difficulty. SCDs were also noted to be in place prior to the initiation of anesthesia.  The patient was then prepped and draped in the usual sterile fashion.   A surgical timeout was performed indicating the correct patient, procedure, positioning and need for preoperative antibiotics.    Cystoscopy and ureter stent placement was performed by urology prior to the procedure for easier identification of the ureters.  Once this was completed a second surgical timeout was performed confirming the correct patient, procedure positioning and need for preoperative antibiotics.  I began by making a lower midline incision using a 10 blade scalpel.  This was carried down through subcutaneous tissues using electrocautery.  The fascia was incised at midline.  The peritoneum was entered bluntly.  I placed an Horticulturist, commercialAlexis wound protector.  I carefully mobilized the small bowel out of the pelvis.  There was a loop of small intestines that was intimately adherent to the sigmoid colon.  The anterior CT-guided drain could be seen entering into the small intestines on the inferior edge.  I freed the entire loop from the pelvis using sharp and blunt dissection.  This was then brought out of the abdomen.  A 75 mm GIA blue load stapler was used to transect the proximal and distal ends of this loop.  A LigaSure device was used to transect the mesentery.  The 2 ends of the remaining small bowel were reanastomosed using a GIA blue load 75 mm stapler and a 60 mm TA blue load stapler to close the common enterotomy channel.  The edges of this were oversewn using interrupted 3-0 silk suture.  The mesenteric defect was closed using a running 2-0 silk suture.  An anti-tension suture was placed also using a 3-0 silk suture.  This was then placed back into the abdomen and the small bowel was gently packed away from the pelvis.  The patient was placed in Trendelenburg.  I palpated the left ureter and stent and identified the edge of the sigmoid colon.  I was able to bluntly mobilize this and retracted laterally.  I incised along the peritoneal reflection on the left pelvic sidewall to allow for mobilization of the sigmoid out of the pelvis.  I then continue to bluntly mobilize the right side of the abdomen which contained a loop of  sigmoid colon.  The cecum was gently separated from this and packed out of the way.  Identified the right ureter also using palpation of the stent.  I dissected out the right pelvic sidewall and sigmoid colon using blunt dissection.  I entered into the pelvic fluid collection that was previously drained by the posterior CT-guided drain.  I encountered stool and purulent material.  This was aspirated away.  I was able to get down below the inflamed portion of the sigmoid colon and transect this with a contour stapler.  The proximal edge was also transected with a green load contour stapler.  I transected the mesentery using a LigaSure device and sent the specimen to pathology for further examination.  There appears to be a small amount of sigmoid colon that is still in the pelvis.  I did not feel that it was safe to mobilize any further down at this point.  I decided to place a 52 [place] Blake drain in the area of the previous CT-guided drain.  This was brought out through a right lower quadrant incision and secured into place with a 2-0 nylon suture.  The posterior CT-guided drain was then removed.  I placed a 2-0 Prolene suture on the staple line of the rectal stump and secured this to the peritoneum anteriorly.  I then bluntly mobilized the left colon off of the lateral sidewall.  I divided the remaining mesentery using the LigaSure device to allow for mobilization up to the abdominal wall.  I then made a small defect in the skin using electrocautery.  Dissection was carried down through subcutaneous tissues and the fascia was incised in a cruciate manner.  The rectus muscle was split and the peritoneum was entered using electrocautery.  I brought the remaining left colon through this defect to create the ostomy.  We then irrigated the abdomen and pelvis with 2 L of warm normal saline.  The omentum was brought down over the abdominal contents.  The [place] wound protector was removed.  We then switched to clean  gowns, gloves, instruments and drapes.  The fascia was closed using 2 running #1 PDS sutures.  The subcutaneous tissue was irrigated with normal saline and then closed with a running 2-0 Vicryl suture.  The skin was closed with staples and small wicks were placed between the staples for added wound drainage.  A honeycomb dressing was placed over the wound.  A dressing was placed over the JP drain.  The ostomy was then created using electrocautery to remove the staple line and 2-0 Vicryl sutures to secure the ostomy to the skin in standard Brooke fashion.  An ostomy appliance was then placed.  The patient was then awakened from anesthesia and sent to the postanesthesia care unit in stable condition.  All counts were correct per operating room staff.

## 2019-04-18 NOTE — Consult Note (Signed)
Urology Consult  Referring physician: Dr Clovis PuA Thomas Reason for referral: Abdominal abscess  Chief Complaint:  Abdominal abscess  History of Present Illness:  The patient likely has a perforated diverticular abscess.   Urology was consulted for bilateral stents.  The CT scan was within normal limits in terms of her ureters and kidneys.  No GU surgery; no stones Occasional UTI normal LUTS  Modifying factors: There are no other modifying factors  Associated signs and symptoms: There are no other associated signs and symptoms Aggravating and relieving factors: There are no other aggravating or relieving factors Severity: Moderate Duration: Persistent    Past Medical History:  Diagnosis Date  . Allergy   . Anxiety   . GERD (gastroesophageal reflux disease)   . Headache    migraines  . Heartburn   . OA (osteoarthritis)    T/O  . Recurrent cold sores   . Tendon laceration    right  small finger  . Wears glasses    Past Surgical History:  Procedure Laterality Date  . I&D EXTREMITY Right 05/31/2017   Procedure: Right small finger irrigation and debridement and flexor tendon reconstruction;  Surgeon: Dominica SeverinGramig, William, MD;  Location: [facility] OR;  Service: Orthopedics;  Laterality: Right;  . KNEE ARTHROSCOPY     B/L  . REPLACEMENT TOTAL KNEE     B/L  . TENDON REPAIR     left thumb  . WISDOM TOOTH EXTRACTION      Medications: I have reviewed the patient's current medications. Allergies:  Allergies  Allergen Reactions  . Codeine Nausea Only    Family History  Problem Relation Age of Onset  . Macular degeneration Mother   . Pancreatic cancer Father   . Other Sister    Social History:  reports that she has quit smoking. She has never used smokeless tobacco. She reports current alcohol use. She reports that she does not use drugs.  ROS: All systems are reviewed and negative except as noted. Rest negative  Physical Exam:  Vital signs in last 24 hours: Temp:  [97.6 F (36.4  C)-98.9 F (37.2 C)] 98.9 F (37.2 C) (06/17 0454) Pulse Rate:  [85-102] 102 (06/17 0454) Resp:  [14-22] 14 (06/17 0454) BP: (123-147)/(85-100) 123/85 (06/17 0454) SpO2:  [96 %-100 %] 96 % (06/17 0454) Weight:  [81.5 kg] 81.5 kg (06/16 1050)  Cardiovascular: Skin warm; not flushed Respiratory: Breaths quiet; no shortness of breath Abdomen: No masses Neurological: Normal sensation to touch Musculoskeletal: Normal motor function arms and legs Lymphatics: No inguinal adenopathy Skin: No rashes Genitourinary:no acute distress  Laboratory Data:  Results for orders placed or performed during the hospital encounter of 04/04/19 (from the past 72 hour(s))  Glucose, capillary     Status: Abnormal   Collection Time: 04/15/19 11:49 AM  Result Value Ref Range   Glucose-Capillary 111 (H) 70 - 99 mg/dL  Glucose, capillary     Status: None   Collection Time: 04/15/19  4:14 PM  Result Value Ref Range   Glucose-Capillary 97 70 - 99 mg/dL  Comprehensive metabolic panel     Status: Abnormal   Collection Time: 04/16/19  3:23 AM  Result Value Ref Range   Sodium 140 135 - 145 mmol/L   Potassium 4.1 3.5 - 5.1 mmol/L   Chloride 105 98 - 111 mmol/L   CO2 28 22 - 32 mmol/L   Glucose, Bld 134 (H) 70 - 99 mg/dL   BUN 19 6 - 20 mg/dL   Creatinine, Ser 1.610.53 0.44 -  1.00 mg/dL   Calcium 8.3 (L) 8.9 - 10.3 mg/dL   Total Protein 6.1 (L) 6.5 - 8.1 g/dL   Albumin 2.4 (L) 3.5 - 5.0 g/dL   AST 18 15 - 41 U/L   ALT 27 0 - 44 U/L   Alkaline Phosphatase 72 38 - 126 U/L   Total Bilirubin 0.3 0.3 - 1.2 mg/dL   GFR calc non Af Amer >60 >60 mL/min   GFR calc Af Amer >60 >60 mL/min   Anion gap 7 5 - 15    Comment: Performed at [facility], [address] [place], [place] [address]  Magnesium     Status: None   Collection Time: 04/16/19  3:23 AM  Result Value Ref Range   Magnesium 2.2 1.7 - 2.4 mg/dL    Comment: Performed at [facility], [address]  [place], [place] [address]  Phosphorus     Status: None   Collection Time: 04/16/19  3:23 AM  Result Value Ref Range   Phosphorus 4.0 2.5 - 4.6 mg/dL    Comment: Performed at [facility], [address] [place], [place] [address]  CBC     Status: Abnormal   Collection Time: 04/16/19  3:23 AM  Result Value Ref Range   WBC 6.8 4.0 - 10.5 K/uL   RBC 3.12 (L) 3.87 - 5.11 MIL/uL   Hemoglobin 9.4 (L) 12.0 - 15.0 g/dL   HCT 40.329.7 (L) 47.436.0 - 25.946.0 %   MCV 95.2 80.0 - 100.0 fL   MCH 30.1 26.0 - 34.0 pg   MCHC 31.6 30.0 - 36.0 g/dL   RDW 56.314.1 87.511.5 - 64.315.5 %   Platelets 480 (H) 150 - 400 K/uL   nRBC 0.0 0.0 - 0.2 %    Comment: Performed at [facility], [address] [place], [place] [address]  Differential     Status: None   Collection Time: 04/16/19  3:23 AM  Result Value Ref Range   Neutrophils Relative % 78 %   Neutro Abs 5.2 1.7 - 7.7 K/uL   Lymphocytes Relative 11 %   Lymphs Abs 0.8 0.7 - 4.0 K/uL   Monocytes Relative 8 %   Monocytes Absolute 0.5 0.1 - 1.0 K/uL   Eosinophils Relative 3 %   Eosinophils Absolute 0.2 0.0 - 0.5 K/uL   Basophils Relative 0 %   Basophils Absolute 0.0 0.0 - 0.1 K/uL   Immature Granulocytes 0 %   Abs Immature Granulocytes 0.03 0.00 - 0.07 K/uL    Comment: Performed at [facility], [address] [place], [place] [address]  Triglycerides     Status: None   Collection Time: 04/16/19  3:23 AM  Result Value Ref Range   Triglycerides 54 <150 mg/dL    Comment: Performed at [facility], [address] [place], [place] [address]  Prealbumin     Status: Abnormal   Collection Time: 04/16/19  3:23 AM  Result Value Ref Range   Prealbumin 8.3 (L) 18 - 38 mg/dL    Comment: Performed at [facility], [address] [place], [place] [address]  Glucose, capillary     Status: Abnormal   Collection Time: 04/16/19  7:45 AM  Result Value Ref Range   Glucose-Capillary 116 (H)  70 - 99 mg/dL  Glucose, capillary     Status: Abnormal   Collection Time: 04/16/19 11:02 AM  Result Value Ref Range   Glucose-Capillary  119 (H) 70 - 99 mg/dL  Glucose, capillary     Status: Abnormal   Collection Time: 04/16/19  4:32 PM  Result Value Ref Range   Glucose-Capillary 109 (H) 70 - 99 mg/dL  CBC     Status: Abnormal   Collection Time: 04/17/19  4:30 AM  Result Value Ref Range   WBC 7.6 4.0 - 10.5 K/uL   RBC 3.15 (L) 3.87 - 5.11 MIL/uL   Hemoglobin 9.7 (L) 12.0 - 15.0 g/dL   HCT 30.0 (L) 36.0 - 46.0 %   MCV 95.2 80.0 - 100.0 fL   MCH 30.8 26.0 - 34.0 pg   MCHC 32.3 30.0 - 36.0 g/dL   RDW 13.6 11.5 - 15.5 %   Platelets 534 (H) 150 - 400 K/uL   nRBC 0.0 0.0 - 0.2 %    Comment: Performed at [facility], [place] [address] [place], [place] [address]  Basic metabolic panel     Status: Abnormal   Collection Time: 04/17/19  4:30 AM  Result Value Ref Range   Sodium 141 135 - 145 mmol/L   Potassium 3.7 3.5 - 5.1 mmol/L   Chloride 108 98 - 111 mmol/L   CO2 22 22 - 32 mmol/L   Glucose, Bld 132 (H) 70 - 99 mg/dL   BUN 14 6 - 20 mg/dL   Creatinine, Ser 0.47 0.44 - 1.00 mg/dL   Calcium 8.3 (L) 8.9 - 10.3 mg/dL   GFR calc non Af Amer >60 >60 mL/min   GFR calc Af Amer >60 >60 mL/min   Anion gap 11 5 - 15    Comment: Performed at [facility], [place] [address], [place], [place] [address]  Magnesium     Status: None   Collection Time: 04/17/19  4:30 AM  Result Value Ref Range   Magnesium 2.2 1.7 - 2.4 mg/dL    Comment: Performed at [facility], [place] [address] [place], [place] [address]  Glucose, capillary     Status: Abnormal   Collection Time: 04/17/19  6:57 AM  Result Value Ref Range   Glucose-Capillary 118 (H) 70 - 99 mg/dL  Glucose, capillary     Status: Abnormal   Collection Time: 04/17/19 11:01 AM  Result Value Ref Range   Glucose-Capillary 100 (H) 70 - 99 mg/dL  Glucose, capillary     Status: Abnormal    Collection Time: 04/17/19  4:13 PM  Result Value Ref Range   Glucose-Capillary 114 (H) 70 - 99 mg/dL  CBC     Status: Abnormal   Collection Time: 04/18/19  3:24 AM  Result Value Ref Range   WBC 6.7 4.0 - 10.5 K/uL   RBC 3.20 (L) 3.87 - 5.11 MIL/uL   Hemoglobin 9.8 (L) 12.0 - 15.0 g/dL   HCT 30.4 (L) 36.0 - 46.0 %   MCV 95.0 80.0 - 100.0 fL   MCH 30.6 26.0 - 34.0 pg   MCHC 32.2 30.0 - 36.0 g/dL   RDW 13.3 11.5 - 15.5 %   Platelets 540 (H) 150 - 400 K/uL   nRBC 0.0 0.0 - 0.2 %    Comment: Performed at [facility], [place] [address] [place], [place] [address]  Basic metabolic panel     Status: Abnormal   Collection Time: 04/18/19  3:24 AM  Result Value Ref Range   Sodium 140 135 - 145 mmol/L   Potassium 3.9 3.5 - 5.1 mmol/L   Chloride 108 98 -  111 mmol/L   CO2 23 22 - 32 mmol/L   Glucose, Bld 120 (H) 70 - 99 mg/dL   BUN 13 6 - 20 mg/dL   Creatinine, Ser 1.610.43 (L) 0.44 - 1.00 mg/dL   Calcium 8.2 (L) 8.9 - 10.3 mg/dL   GFR calc non Af Amer >60 >60 mL/min   GFR calc Af Amer >60 >60 mL/min   Anion gap 9 5 - 15    Comment: Performed at [facility], [address] [place], [place] [address]  Magnesium     Status: None   Collection Time: 04/18/19  3:24 AM  Result Value Ref Range   Magnesium 2.2 1.7 - 2.4 mg/dL    Comment: Performed at [facility], [address] [place], [place] [address]   Recent Results (from the past 240 hour(s))  Aerobic/Anaerobic Culture (surgical/deep wound)     Status: None   Collection Time: 04/10/19  2:05 PM   Specimen: Abscess  Result Value Ref Range Status   Specimen Description   Final    ABSCESS DIVERTICULAR Performed at [facility], [address] [place], [place] [address]    Special Requests   Final    Normal Performed at [facility], [address] [place], [place] [address]    Gram Stain   Final    ABUNDANT WBC PRESENT, PREDOMINANTLY  PMN ABUNDANT GRAM NEGATIVE RODS ABUNDANT GRAM POSITIVE COCCI FEW GRAM VARIABLE ROD Performed at [facility], [address] [place], [place] [address]    Culture   Final    ABUNDANT MULTIPLE ORGANISMS PRESENT, NONE PREDOMINANT ABUNDANT MIXED ANAEROBIC FLORA PRESENT.  CALL LAB IF FURTHER IID REQUIRED.    Report Status 04/13/2019 FINAL  Final  Aerobic/Anaerobic Culture (surgical/deep wound)     Status: None   Collection Time: 04/10/19  2:06 PM   Specimen: Abscess  Result Value Ref Range Status   Specimen Description   Final    ABSCESS PELVIC Performed at [facility], [address] [place], [place] [address]    Special Requests   Final    Normal Performed at [facility], [address] [place], [place] [address]    Gram Stain   Final    ABUNDANT WBC PRESENT,BOTH PMN AND MONONUCLEAR ABUNDANT GRAM NEGATIVE RODS MODERATE GRAM POSITIVE COCCI MODERATE GRAM VARIABLE ROD Performed at [facility], [address] [place], [place] [address]    Culture   Final    ABUNDANT MULTIPLE ORGANISMS PRESENT, NONE PREDOMINANT ABUNDANT MIXED ANAEROBIC FLORA PRESENT.  CALL LAB IF FURTHER IID REQUIRED.    Report Status 04/13/2019 FINAL  Final   Creatinine: Recent Labs    04/12/19 0424 04/13/19 0327 04/14/19 0311 04/15/19 0321 04/16/19 0323 04/17/19 0430 04/18/19 0324  CREATININE 0.31* 0.39* 0.46 0.58 0.53 0.47 0.43*    Xrays: See report/chart Reviewed; no hydro  Impression/Assessment:  Bilateral stents and retros and cysto discussed Pros, cons, risk, and sequelae discussed Remove in office  Plan:  As noted picture drawn  Jerelle Virden A Nyaisha Simao 04/18/2019, 8:26 AM

## 2019-04-18 NOTE — Progress Notes (Signed)
PHARMACY - ADULT TOTAL PARENTERAL NUTRITION CONSULT NOTE   Pharmacy Consult for TPN Indication: intolerance to enteral feedings, sigmoid diverticulitis, SBO  Patient Measurements: Height: 5\' 4"  (162.6 cm) Weight: 179 lb 10.8 oz (81.5 kg) IBW/kg (Calculated) : 54.7 TPN AdjBW (KG): 61 Body mass index is 30.84 kg/m.  Current Nutrition: NPO, TPN  IVF: NS at 169ml/hr  Central access:  PICC to be placed 6/9 TPN start date: 6/9  ASSESSMENT                                                                                                          HPI: 53 yo female with sigmoid diverticulitis with abscesses worsened since admission.  IR placed drain 6/9. Patient has been intolerant to enteral feedings for about a week so plan to place PICC line and start TPN per CCS.  Significant events:  6/17: plan for sigmoid colectomy with colostomy today  Today, 04/18/19  Glucose - at goal < 150, CBGs & SSI d/c'd 6/16  Electrolytes - stable WNL except K 3.9 today (goal >4)  Renal - SCr 0.43 stable  LFTs - WNL  TGs - 91 (6/10), 54 (6/15)  Prealbumin - <5 (6/10), 8.3 (6/15)  NUTRITIONAL GOALS                                                                                             RD recs: Kcal/day: 2000-2240, protein/day: 95-105g  Custom TPN to provide 99g protein per day and 1915 kcal/day at goal rate of 75 ml/hr (96% of estimated needs per RD)  PLAN                                                                                                                         At 1800 today:  Continue custom TPN at goal rate of 75 ml/hr  TPN will contain standard electrolytes except: increase Na at 75, K at 90, and max acetate.   TPN to contain standard multivitamins daily and trace elements on MWF due to national shortage  Continue IVF at 157ml/hr  TPN lab panels on Mondays & Thursdays.  Netta Cedars, PharmD, BCPS 04/18/2019 8:00 AM

## 2019-04-18 NOTE — Interval H&P Note (Signed)
History and Physical Interval Note:  04/18/2019 10:18 AM  Molly Beck  has presented today for surgery, with the diagnosis of obstruction.  The various methods of treatment have been discussed with the patient and family. After consideration of risks, benefits and other options for treatment, the patient has consented to  Procedure(s): EXPLORATORY LAPAROTOMY POSSIBLE SIGMOID RESECTION POSSIBLE OSTOMY (N/A) CYSTOSCOPY WITH STENT PLACEMENT (Bilateral) as a surgical intervention.  The patient's history has been reviewed, patient examined, no change in status, stable for surgery.  I have reviewed the patient's chart and labs.  Questions were answered to the patient's satisfaction.     Auryn Paige A Hernan Turnage

## 2019-04-18 NOTE — Op Note (Signed)
Preoperative diagnosis: Pelvic and abdominal abscess Postoperative diagnosis: Pelvic and abdominal abscess Surgery: Cystoscopy bilateral retrograde ureterogram's and bilateral stents Surgeon: Dr. Nicki Reaper Caitland Porchia  The patient has the above diagnosis and consented the above procedure.  This was to help identify and protect ureters.  Preoperative antibiotics have been given.  Extra care was taken with leg positioning to minimize the risk of compartment syndrome and neuropathy and deep vein thrombosis  24 French cystoscope was utilized.  Bladder mucosa and trigone were normal.  Ureters were quite flat with a flat trigone.  I engage the left ureter with a sensor wire passed into almost the iliac vessels.  Next [place] open-ended ureteral catheter was passed at this level.  I did a gentle retrograde utilizing 5 cc of contrast.  There is no deviation of ureter.  No hydronephrosis or hydroureter.  Under fluoroscopic guidance the sensor wire was passed to the upper pole calyx  A 24 cm x 6 French stent well prepared with no string was easily passed under cystoscopic and microscopic guidance curling in the upper pole the left kidney and curling in the bladder.  X-ray taken  The identical procedure was done on the right side.  The identical size stent 24 x 6 was also placed.  The technique was identical to the above dictation.  Retrograde ureterogram; 4 6 cc of contrast was utilized.  No hydronephrosis.  No deviation of ureter.  Both stents are in excellent position at the end of the case.  Foley catheter was applied.  Should be followed as per protocol

## 2019-04-18 NOTE — Progress Notes (Addendum)
Subjective: CC: Abdominal pain Patient continued to have some abdominal pain and nausea throughout the night. NGT with bilious output. Anterior abdominal drain with bilious output since yesterday afternoon as well.   Objective: Vital signs in last 24 hours: Temp:  [97.6 F (36.4 C)-98.9 F (37.2 C)] 98.9 F (37.2 C) (06/17 0454) Pulse Rate:  [85-102] 102 (06/17 0454) Resp:  [14-22] 14 (06/17 0454) BP: (123-147)/(85-100) 123/85 (06/17 0454) SpO2:  [96 %-100 %] 96 % (06/17 0454) Weight:  [81.5 kg] 81.5 kg (06/16 1050) Last BM Date: 04/12/19  Intake/Output from previous day: 06/16 0701 - 06/17 0700 In: 3662.1 [I.V.:3483.1; IV Piggyback:179] Out: 510 [Emesis/NG output:200; Drains:310] Intake/Output this shift: No intake/output data recorded.  PE: Gen: Sleeping, awoken and appears awake and alert, NAD Lungs: Normal rate and effort Abd: Soft, mild distension, generalized tenderness without peritonitis, hypoactive bowel sounds. Both IR drains are in place. Right abdominal drain with bilious output. 200cc/24 hours. Buttock drain with fecal like fluid. 30cc/24 hours.  Msk: Right lower leg cast in place  Lab Results:  Recent Labs    04/17/19 0430 04/18/19 0324  WBC 7.6 6.7  HGB 9.7* 9.8*  HCT 30.0* 30.4*  PLT 534* 540*   BMET Recent Labs    04/17/19 0430 04/18/19 0324  NA 141 140  K 3.7 3.9  CL 108 108  CO2 22 23  GLUCOSE 132* 120*  BUN 14 13  CREATININE 0.47 0.43*  CALCIUM 8.3* 8.2*   PT/INR No results for input(s): LABPROT, INR in the last 72 hours. CMP     Component Value Date/Time   NA 140 04/18/2019 0324   K 3.9 04/18/2019 0324   CL 108 04/18/2019 0324   CO2 23 04/18/2019 0324   GLUCOSE 120 (H) 04/18/2019 0324   BUN 13 04/18/2019 0324   CREATININE 0.43 (L) 04/18/2019 0324   CALCIUM 8.2 (L) 04/18/2019 0324   PROT 6.1 (L) 04/16/2019 0323   ALBUMIN 2.4 (L) 04/16/2019 0323   AST 18 04/16/2019 0323   ALT 27 04/16/2019 0323   ALKPHOS 72 04/16/2019  0323   BILITOT 0.3 04/16/2019 0323   GFRNONAA >60 04/18/2019 0324   GFRAA >60 04/18/2019 0324   Lipase     Component Value Date/Time   LIPASE 25 09/12/2015 1615       Studies/Results: Dg Abd Portable 1v  Result Date: 04/17/2019 CLINICAL DATA:  Small bowel obstruction. EXAM: PORTABLE ABDOMEN - 1 VIEW COMPARISON:  One-view abdomen 04/16/2019 FINDINGS: Dilated loops of small bowel in the upper abdomen is similar the prior study. More distal loops demonstrate some decompression appear to the most recent x-ray. Surgical drains remain in place. No definite free air is present. Contrast is again noted in the colon. IMPRESSION: 1. Similar dilation of transverse loop of small bowel in the upper abdomen. 2. The remainder of the small bowel has begun to decompressed. 3. Drainage catheters are stable. Electronically Signed   By: Marin Robertshristopher  Mattern M.D.   On: 04/17/2019 10:38   Dg Abd Portable 1v-small Bowel Obstruction Protocol-initial, 8 Hr Delay  Result Date: 04/16/2019 CLINICAL DATA:  Small bowel obstruction. 8 hour delayed film. EXAM: PORTABLE ABDOMEN - 1 VIEW COMPARISON:  CT yesterday. FINDINGS: Enteric contrast in the colon, however unclear if this contrast was from prior CT or administered for small bowel protocol. There is residual contrast in the stomach. Persistent dilated small bowel in the central abdomen a 5.1 cm. Two drainage catheters overlie the pelvis. Advanced left  hip osteoarthritis. IMPRESSION: Enteric contrast in the colon, however it is unclear if this is residual contrast that was seen on CT yesterday versus that administered for the small-bowel protocol. There is residual contrast in the stomach and persistent dilated small bowel in the central abdomen. Recommend 24 hour delayed film. Electronically Signed   By: Keith Rake M.D.   On: 04/16/2019 23:43   Dg Abd Portable 1v-small Bowel Protocol-position Verification  Result Date: 04/16/2019 CLINICAL DATA:  NG tube placement.  EXAM: PORTABLE ABDOMEN - 1 VIEW COMPARISON:  Abdominal CT 04/15/2019 FINDINGS: Enteric catheter projects over the expected location of gastric cardia. Persistent small bowel loop dilation with maximum diameter of 5.4 cm. No free intra-abdominal gas is seen. IMPRESSION: 1. Enteric catheter projects over the expected location of gastric cardia. 2. Persistent small bowel obstruction. Electronically Signed   By: Fidela Salisbury M.D.   On: 04/16/2019 13:20    Anti-infectives: Anti-infectives (From admission, onward)   Start     Dose/Rate Route Frequency Ordered Stop   04/16/19 0600  cefoTEtan (CEFOTAN) 2 g in sodium chloride 0.9 % 100 mL IVPB     2 g 200 mL/hr over 30 Minutes Intravenous On call to O.R. 04/15/19 1505 04/17/19 0559   04/05/19 0600  piperacillin-tazobactam (ZOSYN) IVPB 3.375 g     3.375 g 12.5 mL/hr over 240 Minutes Intravenous Every 8 hours 04/05/19 0121     04/05/19 0000  piperacillin-tazobactam (ZOSYN) IVPB 3.375 g     3.375 g 100 mL/hr over 30 Minutes Intravenous  Once 04/04/19 2355 04/05/19 0119       Assessment/Plan GERD Osteoarthritis - takes meloxicam and tramadol at baseline Recent right ankle surgery by Dr. Hewitt5/26/2020 Malnutrition - prealbumin 8.3 on TPN  Sigmoiddiverticulitis with abscesses SBO - s/p IR drain x2 placement 6/9, cultureswith multiple organisms present, none predominant.  - CT 6/14 reveals a SBO with transition point, diverticulitis seemed to be improving  - Drain now with bilious output similar to NGT output concerning for perforation. Will plan for exploratory laparotomy today by Dr. Marcello Moores. Discussed with Dr. Matilde Sprang of Urology who will place b/l ureteral stents at the time of the procedure. Patients mother, Molly Beck was on the phone when we discussed this plan. Patient is in agreement and would like to proceed.   ID -Zosyn 6/3>> VTE -SCD,lovenox FEN -NPO, TPN Foley -none Follow up -TBD POC Molly Beck (Mother) [phone]   Molly Beck (Daughter) [phone]   LOS: 13 days    Molly Beck , [facility] 04/18/2019, 7:58 AM Pager: [phone]

## 2019-04-18 NOTE — Anesthesia Preprocedure Evaluation (Signed)
Anesthesia Evaluation  Patient identified by MRN, date of birth, ID band Patient awake    Reviewed: Allergy & Precautions, NPO status , Patient's Chart, lab work & pertinent test results  History of Anesthesia Complications Negative for: history of anesthetic complications  Airway Mallampati: I  TM Distance: >3 FB Neck ROM: Full    Dental no notable dental hx. (+) Teeth Intact   Pulmonary neg shortness of breath, neg sleep apnea, neg COPD, neg recent URI, former smoker,    Pulmonary exam normal breath sounds clear to auscultation       Cardiovascular negative cardio ROS Normal cardiovascular exam Rhythm:Regular Rate:Normal     Neuro/Psych  Headaches, neg Seizures Anxiety    GI/Hepatic Neg liver ROS, GERD  Medicated and Controlled,  Endo/Other  negative endocrine ROS  Renal/GU negative Renal ROS     Musculoskeletal  (+) Arthritis ,   Abdominal Normal abdominal exam  (+)   Peds  Hematology  (+) Blood dyscrasia, anemia ,   Anesthesia Other Findings   Reproductive/Obstetrics                             Anesthesia Physical  Anesthesia Plan  ASA: II  Anesthesia Plan: General   Post-op Pain Management:    Induction: Intravenous  PONV Risk Score and Plan: 4 or greater and Ondansetron and Dexamethasone  Airway Management Planned: Oral ETT  Additional Equipment: None  Intra-op Plan:   Post-operative Plan: Extubation in OR  Informed Consent: I have reviewed the patients History and Physical, chart, labs and discussed the procedure including the risks, benefits and alternatives for the proposed anesthesia with the patient or authorized representative who has indicated his/her understanding and acceptance.     Dental advisory given  Plan Discussed with: CRNA and Surgeon  Anesthesia Plan Comments:         Anesthesia Quick Evaluation

## 2019-04-19 ENCOUNTER — Encounter (HOSPITAL_COMMUNITY): Payer: Self-pay | Admitting: General Surgery

## 2019-04-19 LAB — COMPREHENSIVE METABOLIC PANEL
ALT: 45 U/L — ABNORMAL HIGH (ref 0–44)
ALT: 36 U/L (ref 0–44)
AST: 21 U/L (ref 15–41)
AST: 17 U/L (ref 15–41)
Albumin: 2.5 g/dL — ABNORMAL LOW (ref 3.5–5.0)
Albumin: 2.5 g/dL — ABNORMAL LOW (ref 3.5–5.0)
Alkaline Phosphatase: 84 U/L (ref 38–126)
Alkaline Phosphatase: 85 U/L (ref 38–126)
Anion gap: 8 (ref 5–15)
Anion gap: 7 (ref 5–15)
BUN: 22 mg/dL — ABNORMAL HIGH (ref 6–20)
BUN: 24 mg/dL — ABNORMAL HIGH (ref 6–20)
CO2: 25 mmol/L (ref 22–32)
CO2: 28 mmol/L (ref 22–32)
Calcium: 8.3 mg/dL — ABNORMAL LOW (ref 8.9–10.3)
Calcium: 8.1 mg/dL — ABNORMAL LOW (ref 8.9–10.3)
Chloride: 101 mmol/L (ref 98–111)
Chloride: 102 mmol/L (ref 98–111)
Creatinine, Ser: 0.61 mg/dL (ref 0.44–1.00)
Creatinine, Ser: 0.53 mg/dL (ref 0.44–1.00)
GFR calc Af Amer: >60 mL/min (ref >60)
GFR calc Af Amer: >60 mL/min (ref >60)
GFR calc non Af Amer: >60 mL/min (ref >60)
GFR calc non Af Amer: >60 mL/min (ref >60)
Glucose, Bld: 128 mg/dL — ABNORMAL HIGH (ref 70–99)
Glucose, Bld: 113 mg/dL — ABNORMAL HIGH (ref 70–99)
Potassium: 4.4 mmol/L (ref 3.5–5.1)
Potassium: 3.9 mmol/L (ref 3.5–5.1)
Sodium: 134 mmol/L — ABNORMAL LOW (ref 135–145)
Sodium: 137 mmol/L (ref 135–145)
Total Bilirubin: 0.8 mg/dL (ref 0.3–1.2)
Total Bilirubin: 0.6 mg/dL (ref 0.3–1.2)
Total Protein: 6.2 g/dL — ABNORMAL LOW (ref 6.5–8.1)
Total Protein: 6 g/dL — ABNORMAL LOW (ref 6.5–8.1)

## 2019-04-19 LAB — IRON AND TIBC
Iron: 16 ug/dL — ABNORMAL LOW (ref 28–170)
Saturation Ratios: 8 % — ABNORMAL LOW (ref 10.4–31.8)
TIBC: 205 ug/dL — ABNORMAL LOW (ref 250–450)
UIBC: 189 ug/dL

## 2019-04-19 LAB — CBC WITH DIFFERENTIAL/PLATELET
Abs Immature Granulocytes: 0.06 10*3/uL (ref 0.00–0.07)
Basophils Absolute: 0 10*3/uL (ref 0.0–0.1)
Basophils Relative: 0 %
Eosinophils Absolute: 0 10*3/uL (ref 0.0–0.5)
Eosinophils Relative: 0 %
HCT: 30.6 % — ABNORMAL LOW (ref 36.0–46.0)
Hemoglobin: 9.9 g/dL — ABNORMAL LOW (ref 12.0–15.0)
Immature Granulocytes: 1 %
Lymphocytes Relative: 13 %
Lymphs Abs: 1.5 10*3/uL (ref 0.7–4.0)
MCH: 30.7 pg (ref 26.0–34.0)
MCHC: 32.4 g/dL (ref 30.0–36.0)
MCV: 94.7 fL (ref 80.0–100.0)
Monocytes Absolute: 1 10*3/uL (ref 0.1–1.0)
Monocytes Relative: 8 %
Neutro Abs: 9.1 10*3/uL — ABNORMAL HIGH (ref 1.7–7.7)
Neutrophils Relative %: 78 %
Platelets: 584 10*3/uL — ABNORMAL HIGH (ref 150–400)
RBC: 3.23 MIL/uL — ABNORMAL LOW (ref 3.87–5.11)
RDW: 13.3 % (ref 11.5–15.5)
WBC: 11.6 10*3/uL — ABNORMAL HIGH (ref 4.0–10.5)
nRBC: 0 % (ref 0.0–0.2)

## 2019-04-19 LAB — NOVEL CORONAVIRUS, NAA (HOSP ORDER, SEND-OUT TO REF LAB; TAT 18-24 HRS): SARS-CoV-2, NAA: NOT DETECTED

## 2019-04-19 LAB — MAGNESIUM: Magnesium: 1.9 mg/dL (ref 1.7–2.4)

## 2019-04-19 LAB — VITAMIN B12: Vitamin B-12: 848 pg/mL (ref 180–914)

## 2019-04-19 LAB — RETICULOCYTES
Immature Retic Fract: 24.5 % — ABNORMAL HIGH (ref 2.3–15.9)
RBC.: 3.23 MIL/uL — ABNORMAL LOW (ref 3.87–5.11)
Retic Count, Absolute: 45.2 10*3/uL (ref 19.0–186.0)
Retic Ct Pct: 1.4 % (ref 0.4–3.1)

## 2019-04-19 LAB — FOLATE: Folate: 9.5 ng/mL (ref >5.9)

## 2019-04-19 LAB — PHOSPHORUS: Phosphorus: 3.3 mg/dL (ref 2.5–4.6)

## 2019-04-19 LAB — FERRITIN: Ferritin: 171 ng/mL (ref 11–307)

## 2019-04-19 MED ORDER — TRAVASOL 10 % IV SOLN
INTRAVENOUS | Status: AC
  Administered 2019-04-19: 18:00:00 via INTRAVENOUS
  Filled 2019-04-19: qty 990

## 2019-04-19 MED ORDER — HYDROMORPHONE HCL 1 MG/ML IJ SOLN: 1.0000 mg | INTRAMUSCULAR | Status: DC | PRN

## 2019-04-19 MED ORDER — HYDROMORPHONE 1 MG/ML IV SOLN
INTRAVENOUS | Status: DC
  Administered 2019-04-19: 30 mg via INTRAVENOUS
  Administered 2019-04-19: 6.1 mg via INTRAVENOUS
  Administered 2019-04-20: 30 mg via INTRAVENOUS
  Administered 2019-04-20: 4.1 mg via INTRAVENOUS
  Administered 2019-04-20: 2.4 mg via INTRAVENOUS
  Administered 2019-04-20: 3.3 mg via INTRAVENOUS
  Administered 2019-04-20: 0.3 mg via INTRAVENOUS
  Administered 2019-04-20: 5.7 mg via INTRAVENOUS
  Administered 2019-04-20: 0.3 mg via INTRAVENOUS
  Administered 2019-04-21: 2.1 mg via INTRAVENOUS
  Administered 2019-04-21: 1.5 mg via INTRAVENOUS
  Filled 2019-04-19 (×2): qty 30

## 2019-04-19 MED ORDER — SODIUM CHLORIDE 0.9% FLUSH: 9.0000 mL | INTRAVENOUS | Status: DC | PRN

## 2019-04-19 MED ORDER — NALOXONE HCL 0.4 MG/ML IJ SOLN: 0.4000 mg | INTRAMUSCULAR | Status: DC | PRN

## 2019-04-19 MED ORDER — HYDROMORPHONE HCL 1 MG/ML IJ SOLN
1.0000 mg | INTRAMUSCULAR | Status: DC | PRN
  Administered 2019-04-19 (×3): 2 mg via INTRAVENOUS
  Filled 2019-04-19 (×3): qty 2

## 2019-04-19 NOTE — Progress Notes (Signed)
Pts urine continues to be bright red in color. [place], [place] for Dr. Matilde Sprang notified of findings. No new orders at this time

## 2019-04-19 NOTE — Progress Notes (Signed)
PROGRESS NOTE    Molly Beck  ZOX:[id number] DOB: [birth year] DOA: 04/04/2019 PCP: Clementeen GrahamScifres, [name], [name]  Brief Narrative:  Molly Beck a 53 y.o.femalewith medical history significant ofGERD, elective surgery of her right foot a week ago presenting to the hospital for evaluation of abdominal pain.Patient states she had areconstructive right foot surgery done a week ago. States since then she has been constipated due to taking pain medications. Her last bowel movement was prior to the surgery. For the past few days she is having left lower quadrant abdominal pain which became worse for the past 24 hours. The pain is sharp and excruciating. States today she called her surgeon's office and was advised to take magnesium citrate for constipation. She vomited soon after taking magnesium citrate. Denies any fevers. She continues to complain of abdominal pain despite receiving a dose ofmorphine and multiple doses of fentanyl in the ED. CT abdomen pelvis showing acute sigmoid colitis with small foci of adjacent gas that may be extra luminal and may indicate microperforation. No abscess or drainable fluid collection. She was admitted for further treatment and care.  General surgery was consulted.  **Interim History Patient with improved abdominal discomfort overnight following insertion of NG tube with significant output.  Repeat imaging studies suggest continued small bowel obstruction.  Intra-abdominal drains continue to drain well.    Patient is continued on TPN because of lack of progression and because of concern for bilious drainage she underwent surgical intervention on 04/18/2019 and urology was consulted for bilateral stent placement and cystoscopy.  General surgery did exploratory laparotomy with small bowel resection and Hartman's procedure along with urology doing a cystoscopy with bilateral stent placement.  Today patient was in a lot of pain so surgery was admitted  Dilaudid PCA.  Patient was complaining of significant pressure in her bladder felt that she had a urinary tract infection however it turned out that her catheter was blocked and she is re-cath and 1 L was removed.  Urology recommending continuing Foley until general surgery feels fit to remove.  Assessment & Plan:   Principal Problem:   Perforated sigmoid colon  Active Problems:   Colitis   Sepsis (HCC)   Hypokalemia   H/O foot surgery  Sepsis secondary to Acute Sigmoid Colitis with Microperforation complicated by 2 Absecesses status post drain placement and now exploratory laparotomy with Hartman's procedure a small bowel resection along with cystoscopy and bilateral stent placement SBO Sepsis, present on admission  -Patient presenting to ED with progressive abdominal pain.  CT abdomen/pelvis notable for acute sigmoid colitis with adjacent gas likely secondary to microperforation.   -Patient was initially started on IV Zosyn.  During her initial hospitalization, her abdominal pain continued not to improve with resultant fevers and elevated white count.  Repeat CT abdomen/pelvis reveals marked worsening in the pelvis with 2 abscesses.  Interventional radiology was consulted and placed two drains on 04/10/2019.  -General surgery/IR following, appreciate assistance -Output past 24h: Right abdomen 280 mL's, buttock 30 mL's -Blood culture 04/05/2019 no growth x5 days -Wound culture 04/10/2019: Abundant mixed organisms, none predominant -Continue IV Zosyn for now -NGT placement 04/16/2019 per general surgery; C/w LIWS -TPN, NS at 100 mL/hr given intolerance to diet -Continue to monitor drain output and BMs -Because of minimal improvement and now with green bile color in anterior abdominal drain, General Surgery recommending exploratory laparotomy and patient underwent a Hartman's procedure and small bowel resection; she is postoperative day 1 today -Urology Consulted by General Surgery for  Bilateral  Renal Stents and Cystoscopy; this was placed prior to her surgery; unfortunately today her catheter got blocked and so she was re-catheter and got out 1 L with minimal blood.  Urology recommending continuing Foley until surgery is okay with this and general surgery recommending discontinuing Foley catheter tomorrow -Last SARS-CoV-2 testing was negative on 04/05/2019 however this was repeated this morning -Currently getting TPN -KUB yesterday AM showed "Improving small bowel obstruction. Contrast in the colon now reaches the rectum. Stable percutaneous drains in the pelvis."  -Continue supportive care and antiemetics -Pain control per general surgery as they adding a PCA with Dilaudid and continue Toradol for now  Rash -Maculopapular rash bilateral distribution on her back.  -Could be due to heat/sweat rash or contact from bedsheets.   -She has tolerated IV Zosyn for the past week without any signs of allergic reaction.  This does not appear to be an reaction to Zosyn at this time.   -Continue IV Benadryl and hydrocortisone cream prn.  -Continue hygiene and change of bed sheets daily  Recent right foot surgery and Tendon Repair -Currently in a cast, plans to follow-up with her orthopedic surgeon as an outpatient -PT following -Continue nonweightbearing right lower extremity  Hypokalemia -K+ was 3.9 this AM -Continue to Monitor and Replete as Necessary -Repeat CMP in AM  Normocytic Anmeia -Patient's Hgb/Hct stable and is now 9.9/30.6 -Anemia panel done and showed an iron level of 16, U IBC 189, TIBC of 205, saturation ratios of 8%, ferritin level 171, folate level 9.5, and vitamin B12 848 -Continue to Monitor for S/Sx of Bleeding; Currently no overt Bleeding  -Repeat CBC in AM   Hyperglycemia -Likely reactive and in the setting of TPN; CBG's ranging from 100-118 -HbA1c was 4.9 on 04/14/2019 -Glucose this a.m. on CMP was 128 -CBG's ranging from 100-118  GERD -Continue with PPI IV  with Pantoprazole 40 mg q24h  Anxiety -Continue with Lorazepam 0.5 mg IV every 6 as needed for Anxiety  Obesity -Estimated body mass index is 30.84 kg/m as calculated from the following:   Height as of this encounter: 5\' 4"  (1.626 m).   Weight as of this encounter: 81.5 kg. -Weight Loss and Dietary Counseling given   Leukocytosis -In the setting of surgery and reactive- -WBC went from 6.7 is now 11.6 -Continue to monitor for signs and symptoms of infection -Repeat CBC in a.m.  Abnormal ALT -Patient's ALT was 45 and ? If elevated in the setting of surgery -If consistently elevated or worsening will obtain right upper quadrant ultrasound as well as acute hepatitis panel -Continue monitor and trend repeat CMP in a.m.  Thrombocytosis   -Likely reactive in the setting of antibiotic use -Patient's platelet count has been elevating and is now 584 Plan continue monitor and trend -Repeat CBC in AM   DVT prophylaxis: Enoxaparin 40 mg sq q24h Code Status: FULL CODE  Family Communication: No family present at bedside  Disposition Plan: Remain Inpatient as patient is going for Surgical Intervention    Consultants:   Interventional Radiology  General Surgery  Orthopedic Surgery   Urology   Procedures  IR drain placement x 2 04/10/2019  PICC RUE 04/10/2019  Surgical Intervention today and Cystoscopy   Antimicrobials:  Anti-infectives (From admission, onward)   Start     Dose/Rate Route Frequency Ordered Stop   04/16/19 0600  cefoTEtan (CEFOTAN) 2 g in sodium chloride 0.9 % 100 mL IVPB     2 g 200 mL/hr over 30  Minutes Intravenous On call to O.R. 04/15/19 1505 04/17/19 0559   04/05/19 0600  piperacillin-tazobactam (ZOSYN) IVPB 3.375 g     3.375 g 12.5 mL/hr over 240 Minutes Intravenous Every 8 hours 04/05/19 0121     04/05/19 0000  piperacillin-tazobactam (ZOSYN) IVPB 3.375 g     3.375 g 100 mL/hr over 30 Minutes Intravenous  Once 04/04/19 2355 04/05/19 0119      Subjective: Seen and examined at bedside was very frustrated and complaining of a lot of pain today that was uncontrolled.  NG tube started leaking today and had soaked the bed.  Patient is also complaining some bladder pressure that felt like a urinary tract infection.  Patient's catheter was actually blocked and when she was recasted provided some relief.  She denied chest pain, lightheadedness or dizziness but stated that she felt "extremely terrible".  Objective: Vitals:   04/19/19 0945 04/19/19 1127 04/19/19 1408 04/19/19 1629  BP: 117/74  120/66   Pulse: (!) 118  96   Resp: 18 18 18 18   Temp: 98.2 F (36.8 C)  98 F (36.7 C)   TempSrc: Oral  Oral   SpO2: 100% 100% 100% 100%  Weight:      Height:        Intake/Output Summary (Last 24 hours) at 04/19/2019 1638 Last data filed at 04/19/2019 1313 Gross per 24 hour  Intake 1055.92 ml  Output 1345 ml  Net -289.08 ml   Filed Weights   04/10/19 1059 04/16/19 1700 04/17/19 1050  Weight: 78.9 kg 79.7 kg 81.5 kg   Examination: Physical Exam:  Constitutional: Well-nourished, well-developed slightly obese Caucasian female who appears very frustrated and visibly distraught as well as significantly uncomfortable in pain Eyes: Lids and conjunctive are normal.  Sclera anicteric ENMT: External ears and nose appear normal.  Grossly normal hearing.  Has NG tube in place which had been leaking Neck: Appears supple no JVD Respiratory: Diminished auscultation bilaterally no appreciable wheezing, rales, rhonchi.  Has a normal respiratory effort and she is not tachypneic or using any accessory muscles to breathe Cardiovascular: Slightly tachycardic rate but regular rhythm.  No appreciable murmurs, rubs, gallops Abdomen: Soft, tender to palpate.  Has an ostomy and midline honeycomb dressing GU: Deferred Musculoskeletal: Right foot is in a cast Skin: Skin is warm and dry no appreciable rashes or lesions on limited skin evaluation Neurologic:  Cranial nerves II through XII gross intact no appreciable focal deficits.  Romberg sign and cerebellar reflexes were not assessed Psychiatric: Patient is very frustrated and visibly distraught.  Has a normal judgment and insight.  She is awake, alert, oriented x3  Data Reviewed: I have personally reviewed following labs and imaging studies  CBC: Recent Labs  Lab 04/15/19 0321 04/16/19 0323 04/17/19 0430 04/18/19 0324 04/19/19 0412  WBC 8.9 6.8 7.6 6.7 11.6*  NEUTROABS  --  5.2  --   --  9.1*  HGB 9.8* 9.4* 9.7* 9.8* 9.9*  HCT 30.0* 29.7* 30.0* 30.4* 30.6*  MCV 94.3 95.2 95.2 95.0 94.7  PLT 455* 480* 534* 540* 584*   Basic Metabolic Panel: Recent Labs  Lab 04/13/19 0327 04/14/19 0311 04/15/19 0321 04/16/19 0323 04/17/19 0430 04/18/19 0324 04/19/19 0412  NA 135 136 139 140 141 140 134*  K 4.1 4.4 3.9 4.1 3.7 3.9 4.4  CL 103 101 102 105 108 108 101  CO2 25 26 27 28 22 23 25   GLUCOSE 128* 129* 119* 134* 132* 120* 128*  BUN 11 16 18  19 14 13  22*  CREATININE 0.39* 0.46 0.58 0.53 0.47 0.43* 0.61  CALCIUM 7.8* 8.2* 8.4* 8.3* 8.3* 8.2* 8.3*  MG 2.2 2.3  --  2.2 2.2 2.2 1.9  PHOS 4.0  --   --  4.0  --   --  3.3   GFR: Estimated Creatinine Clearance: 84.9 mL/min (by C-G formula based on SCr of 0.61 mg/dL). Liver Function Tests: Recent Labs  Lab 04/16/19 0323 04/19/19 0412  AST 18 21  ALT 27 45*  ALKPHOS 72 84  BILITOT 0.3 0.8  PROT 6.1* 6.2*  ALBUMIN 2.4* 2.5*   No results for input(s): LIPASE, AMYLASE in the last 168 hours. No results for input(s): AMMONIA in the last 168 hours. Coagulation Profile: No results for input(s): INR, PROTIME in the last 168 hours. Cardiac Enzymes: No results for input(s): CKTOTAL, CKMB, CKMBINDEX, TROPONINI in the last 168 hours. BNP (last 3 results) No results for input(s): PROBNP in the last 8760 hours. HbA1C: No results for input(s): HGBA1C in the last 72 hours. CBG: Recent Labs  Lab 04/16/19 1102 04/16/19 1632 04/17/19 0657  04/17/19 1101 04/17/19 1613  GLUCAP 119* 109* 118* 100* 114*   Lipid Profile: No results for input(s): CHOL, HDL, LDLCALC, TRIG, CHOLHDL, LDLDIRECT in the last 72 hours. Thyroid Function Tests: No results for input(s): TSH, T4TOTAL, FREET4, T3FREE, THYROIDAB in the last 72 hours. Anemia Panel: Recent Labs    04/19/19 0412  VITAMINB12 848  FOLATE 9.5  FERRITIN 171  TIBC 205*  IRON 16*  RETICCTPCT 1.4   Sepsis Labs: No results for input(s): PROCALCITON, LATICACIDVEN in the last 168 hours.  Recent Results (from the past 240 hour(s))  Aerobic/Anaerobic Culture (surgical/deep wound)     Status: None   Collection Time: 04/10/19  2:05 PM   Specimen: Abscess  Result Value Ref Range Status   Specimen Description   Final    ABSCESS DIVERTICULAR Performed at [facility], [address] [place], [place] [address]    Special Requests   Final    Normal Performed at [facility], [address] [place], [place] [address]    Gram Stain   Final    ABUNDANT WBC PRESENT, PREDOMINANTLY PMN ABUNDANT GRAM NEGATIVE RODS ABUNDANT GRAM POSITIVE COCCI FEW GRAM VARIABLE ROD Performed at [facility], [address] [place], [place] [address]    Culture   Final    ABUNDANT MULTIPLE ORGANISMS PRESENT, NONE PREDOMINANT ABUNDANT MIXED ANAEROBIC FLORA PRESENT.  CALL LAB IF FURTHER IID REQUIRED.    Report Status 04/13/2019 FINAL  Final  Aerobic/Anaerobic Culture (surgical/deep wound)     Status: None   Collection Time: 04/10/19  2:06 PM   Specimen: Abscess  Result Value Ref Range Status   Specimen Description   Final    ABSCESS PELVIC Performed at [facility], [address] [place], [place] [address]    Special Requests   Final    Normal Performed at [facility], [address] [place], [place] [address]    Gram Stain   Final    ABUNDANT WBC PRESENT,BOTH PMN AND MONONUCLEAR ABUNDANT GRAM NEGATIVE  RODS MODERATE GRAM POSITIVE COCCI MODERATE GRAM VARIABLE ROD Performed at [facility], [address] [place], [place] [address]    Culture   Final    ABUNDANT MULTIPLE ORGANISMS PRESENT, NONE PREDOMINANT ABUNDANT MIXED ANAEROBIC FLORA PRESENT.  CALL LAB IF FURTHER IID REQUIRED.    Report Status 04/13/2019  FINAL  Final  Novel Coronavirus, NAA (hospital order; send-out to ref lab)     Status: None   Collection Time: 04/18/19  8:45 AM   Specimen: Nasopharyngeal Swab; Respiratory  Result Value Ref Range Status   SARS-CoV-2, NAA NOT DETECTED NOT DETECTED Final    Comment: (NOTE) This test was developed and its performance characteristics determined by [employer]. This test has not been FDA cleared or approved. This test has been authorized by FDA under an Emergency Use Authorization (EUA). This test is only authorized for the duration of time the declaration that circumstances exist justifying the authorization of the emergency use of in vitro diagnostic tests for detection of SARS-CoV-2 virus and/or diagnosis of COVID-19 infection under section 564(b)(1) of the Act, 21 U.S.C. 161WRU-0(A)(5), unless the authorization is terminated or revoked sooner. When diagnostic testing is negative, the possibility of a false negative result should be considered in the context of a patient's recent exposures and the presence of clinical signs and symptoms consistent with COVID-19. An individual without symptoms of COVID-19 and who is not shedding SARS-CoV-2 virus would expect to have a negative (not detected) result in this assay. Performed  At: [facility] [address] [place], [place] [address] Jolene Schimke MD NW:[id number]    Coronavirus Source NASOPHARYNGEAL  Final    Comment: Performed at [facility], [address], [place], [place] [address]    Radiology Studies: Dg Abd Portable 1v  Result Date: 04/18/2019 CLINICAL DATA:  Small bowel  obstruction. EXAM: PORTABLE ABDOMEN - 1 VIEW COMPARISON:  April 17, 2019 FINDINGS: Two drains are seen in the pelvis, in stable position. Contrast is seen in the colon, extending into the rectum. Few mildly prominent loops of small bowel remain in the central abdomen measuring up to 3.3 cm. The more dilated loops superiorly is decreased in caliber. No free air, portal venous gas, or pneumatosis. IMPRESSION: Improving small bowel obstruction. Contrast in the colon now reaches the rectum. Stable percutaneous drains in the pelvis. Electronically Signed   By: Gerome Sam III M.D   On: 04/18/2019 08:12   Dg C-arm 1-60 Min-no Report  Result Date: 04/18/2019 Fluoroscopy was utilized by the requesting physician.  No radiographic interpretation.   Scheduled Meds:  enoxaparin (LOVENOX) injection  40 mg Subcutaneous Q24H   fluticasone  1 spray Each Nare Daily   HYDROmorphone   Intravenous Q4H   pantoprazole (PROTONIX) IV  40 mg Intravenous QHS   sodium chloride flush  10-40 mL Intracatheter Q12H   Continuous Infusions:  methocarbamol (ROBAXIN) IV 500 mg (04/19/19 0618)   piperacillin-tazobactam (ZOSYN)  IV 3.375 g (04/19/19 1349)   TPN ADULT (ION) 75 mL/hr at 04/18/19 1750   TPN ADULT (ION)      LOS: 14 days   Merlene Laughter, DO [facility] PAGER is on AMION  If 7PM-7AM, please contact night-coverage [email or web address] Password [facility] 04/19/2019, 4:38 PM

## 2019-04-19 NOTE — Progress Notes (Signed)
PHARMACY - ADULT TOTAL PARENTERAL NUTRITION CONSULT NOTE   Pharmacy Consult for TPN Indication: intolerance to enteral feedings, sigmoid diverticulitis, SBO  Patient Measurements: Height: 5\' 4"  (162.6 cm) Weight: 179 lb 10.8 oz (81.5 kg) IBW/kg (Calculated) : 54.7 TPN AdjBW (KG): 61 Body mass index is 30.84 kg/m.  Current Nutrition: NPO, TPN  IVF: none  Central access:  PICC to be placed 6/9 TPN start date: 6/9  ASSESSMENT                                                                                                          HPI: 53 yo female with sigmoid diverticulitis with abscesses worsened since admission.  IR placed drain 6/9. Patient has been intolerant to enteral feedings for about a week so plan to place PICC line and start TPN per CCS.  Significant events:  6/17: ex lap with small bowel resection & stent placement  Today, 04/19/19  Glucose - at goal < 150, CBGs & SSI d/c'd 6/16  Electrolytes - stable WNL except Na low at 134 (d/c'd NS IVF), Mag 1.9 (goal>2)  Renal - SCr 0.61 stable  LFTs - WNL  TGs - 91 (6/10), 54 (6/15)  Prealbumin - <5 (6/10), 8.3 (6/15)  NUTRITIONAL GOALS                                                                                             RD recs: Kcal/day: 2000-2240, protein/day: 95-105g  Custom TPN to provide 99g protein per day and 1915 kcal/day at goal rate of 75 ml/hr (96% of estimated needs per RD)  PLAN                                                                                                                         At 1800 today:  Continue custom TPN at goal rate of 75 ml/hr  TPN will contain standard electrolytes except: increase Na to 100, K at 90, Mag at 8 and max acetate.   TPN to contain standard multivitamins daily and trace elements on MWF due to national shortage  IVF per MD (currently none)  TPN lab panels on Mondays & Thursdays.  Check  [place] in am.   Netta Cedars, PharmD, BCPS 04/19/2019  8:32 AM

## 2019-04-19 NOTE — Consult Note (Signed)
[facility] Nurse ostomy consult note Consult received for new left sided colostomy created intraoperatively this morning. Will see tomorrow, 04/19/19 for initial pouch change, new stoma assessment and initiation of eduction.   [facility] nursing team will follow, and will remain available to this patient, the nursing, surgical and medical teams.   Thanks, Maudie Flakes, MSN, RN, [place], Arther Abbott  Pager# [phone]

## 2019-04-19 NOTE — Progress Notes (Signed)
1 Day Post-Op Hartman's resection and small bowel resection Subjective: Having a lot of pain this am.  Objective: Vital signs in last 24 hours: Temp:  [97.5 F (36.4 C)-98.9 F (37.2 C)] 98.3 F (36.8 C) (06/18 0416) Pulse Rate:  [96-126] 107 (06/18 0416) Resp:  [10-43] 18 (06/18 0206) BP: (92-137)/(67-94) 113/75 (06/18 0416) SpO2:  [94 %-100 %] 100 % (06/18 0416)   Intake/Output from previous day: 06/17 0701 - 06/18 0700 In: 3180.9 [I.V.:2706.9; IV Piggyback:399] Out: 2745 [Urine:2050; Emesis/NG output:175; Drains:270; Blood:250] Intake/Output this shift: No intake/output data recorded.   General appearance: alert and cooperative GI: soft, appropriately tender  Incision: no significant drainage  Lab Results:  Recent Labs    04/18/19 0324 04/19/19 0412  WBC 6.7 11.6*  HGB 9.8* 9.9*  HCT 30.4* 30.6*  PLT 540* 584*   BMET Recent Labs    04/18/19 0324 04/19/19 0412  NA 140 134*  K 3.9 4.4  CL 108 101  CO2 23 25  GLUCOSE 120* 128*  BUN 13 22*  CREATININE 0.43* 0.61  CALCIUM 8.2* 8.3*   PT/INR No results for input(s): LABPROT, INR in the last 72 hours. ABG No results for input(s): PHART, HCO3 in the last 72 hours.  Invalid input(s): PCO2, PO2  MEDS, Scheduled . enoxaparin (LOVENOX) injection  40 mg Subcutaneous Q24H  . fluticasone  1 spray Each Nare Daily  . pantoprazole (PROTONIX) IV  40 mg Intravenous QHS  . sodium chloride flush  10-40 mL Intracatheter Q12H    Studies/Results: Dg Abd Portable 1v  Result Date: 04/18/2019 CLINICAL DATA:  Small bowel obstruction. EXAM: PORTABLE ABDOMEN - 1 VIEW COMPARISON:  April 17, 2019 FINDINGS: Two drains are seen in the pelvis, in stable position. Contrast is seen in the colon, extending into the rectum. Few mildly prominent loops of small bowel remain in the central abdomen measuring up to 3.3 cm. The more dilated loops superiorly is decreased in caliber. No free air, portal venous gas, or pneumatosis. IMPRESSION:  Improving small bowel obstruction. Contrast in the colon now reaches the rectum. Stable percutaneous drains in the pelvis. Electronically Signed   By: Dorise Bullion III M.D   On: 04/18/2019 08:12   Dg C-arm 1-60 Min-no Report  Result Date: 04/18/2019 Fluoroscopy was utilized by the requesting physician.  No radiographic interpretation.    Assessment: s/p Procedure(s): EXPLORATORY LAPAROTOMY, HARTMAN'S RESECTION AND SMALL BOWEL RESECTION WITH COLOSTOMY CYSTOSCOPY WITH STENT PLACEMENT Patient Active Problem List   Diagnosis Date Noted  . Perforated sigmoid colon  04/09/2019  . Colitis 04/05/2019  . Sepsis ([place]) 04/05/2019  . Hypokalemia 04/05/2019  . H/O foot surgery 04/05/2019    Expected post op course  Plan: Cont NG  D/c foley tom Dilaudid PCA Cont Toradol   LOS: 14 days     .Rosario Adie, [facility], [place]   04/19/2019 9:10 AM

## 2019-04-19 NOTE — Progress Notes (Signed)
Catheter blocked-pain recath and got 1 liter Mild blood now OK Gave patient my contact She will call me for appointment when gets home Take out foley when surgery wishes

## 2019-04-19 NOTE — Progress Notes (Signed)
Pt complaining of severe bladder spasms. Small amount of blood urine in foley bag. Bladder scan shows greater than 800 in bladder. When I attempted to deflate balloon to adjust catheter found that balloon was already deflated and tip of catheter just slid out. Tip intact. Dr. Marcello Moores on the floor and she was notified of findings. New 4fr foley placed without difficulty. approx 1000 ml of red urine return in bag right away. Pt reports some pain relief after bladder emptied

## 2019-04-20 LAB — CBC WITH DIFFERENTIAL/PLATELET
Abs Immature Granulocytes: 0.05 10*3/uL (ref 0.00–0.07)
Basophils Absolute: 0.1 10*3/uL (ref 0.0–0.1)
Basophils Relative: 0 %
Eosinophils Absolute: 0.2 10*3/uL (ref 0.0–0.5)
Eosinophils Relative: 2 %
HCT: 27 % — ABNORMAL LOW (ref 36.0–46.0)
Hemoglobin: 8.6 g/dL — ABNORMAL LOW (ref 12.0–15.0)
Immature Granulocytes: 0 %
Lymphocytes Relative: 13 %
Lymphs Abs: 1.5 10*3/uL (ref 0.7–4.0)
MCH: 30.3 pg (ref 26.0–34.0)
MCHC: 31.9 g/dL (ref 30.0–36.0)
MCV: 95.1 fL (ref 80.0–100.0)
Monocytes Absolute: 1 10*3/uL (ref 0.1–1.0)
Monocytes Relative: 9 %
Neutro Abs: 8.5 10*3/uL — ABNORMAL HIGH (ref 1.7–7.7)
Neutrophils Relative %: 76 %
Platelets: 532 10*3/uL — ABNORMAL HIGH (ref 150–400)
RBC: 2.84 MIL/uL — ABNORMAL LOW (ref 3.87–5.11)
RDW: 13.6 % (ref 11.5–15.5)
WBC: 11.3 10*3/uL — ABNORMAL HIGH (ref 4.0–10.5)
nRBC: 0 % (ref 0.0–0.2)

## 2019-04-20 LAB — BASIC METABOLIC PANEL
Anion gap: 7 (ref 5–15)
BUN: 27 mg/dL — ABNORMAL HIGH (ref 6–20)
CO2: 29 mmol/L (ref 22–32)
Calcium: 8.2 mg/dL — ABNORMAL LOW (ref 8.9–10.3)
Chloride: 101 mmol/L (ref 98–111)
Creatinine, Ser: 0.65 mg/dL (ref 0.44–1.00)
GFR calc Af Amer: >60 mL/min (ref >60)
GFR calc non Af Amer: >60 mL/min (ref >60)
Glucose, Bld: 116 mg/dL — ABNORMAL HIGH (ref 70–99)
Potassium: 4 mmol/L (ref 3.5–5.1)
Sodium: 137 mmol/L (ref 135–145)

## 2019-04-20 LAB — MAGNESIUM: Magnesium: 2.1 mg/dL (ref 1.7–2.4)

## 2019-04-20 LAB — PHOSPHORUS: Phosphorus: 4.5 mg/dL (ref 2.5–4.6)

## 2019-04-20 MED ORDER — TRAVASOL 10 % IV SOLN
INTRAVENOUS | Status: AC
  Administered 2019-04-20: 18:00:00 via INTRAVENOUS
  Filled 2019-04-20: qty 990

## 2019-04-20 MED ORDER — LIP MEDEX EX OINT
TOPICAL_OINTMENT | CUTANEOUS | Status: AC
  Filled 2019-04-20: qty 7

## 2019-04-20 NOTE — Progress Notes (Signed)
Physical Therapy Treatment Patient Details Name: Molly Beck MRN: [id number] DOB: Aug 02, 1966 Today's Date: 04/20/2019    History of Present Illness Pt admitted with Colitis/sepsis and is s/p R foot reconstructive surgery. s/p placement of 2 drains 04/10/19. She is now s/p EXPLORATORY LAPAROTOMY, HARTMAN'S PROCEDURE, SMALL BOWEL RESECTION and CYSTOSCOPY WITH STENT PLACEMENT (04/18/19)    PT Comments    Pt with decreased mobility due to increased pain since procedure 6/17.  She was able to sit EOB, but unable to get up to recliner due to pain level and her NWB status.  Will continue to work towards improving her mobility.   Follow Up Recommendations  No PT follow up     Equipment Recommendations  None recommended by PT    Recommendations for Other Services       Precautions / Restrictions Precautions Precautions: Fall Precaution Comments: NG tube Restrictions Weight Bearing Restrictions: Yes RLE Weight Bearing: Non weight bearing    Mobility  Bed Mobility Overal bed mobility: Needs Assistance Bed Mobility: Rolling;Sidelying to Sit;Sit to Sidelying Rolling: Min guard Sidelying to sit: Min assist     Sit to sidelying: Min assist General bed mobility comments: MIN A for R LE while pt used pillow with one hand to brace abdomen for pain management  Transfers                 General transfer comment: Pt declined transfer to recliner.  Discussed possibility of using drop arm recliner to manage NWB status with pain, but she would like to hold off.  Ambulation/Gait                 Stairs             Wheelchair Mobility    Modified Rankin (Stroke Patients Only)       Balance                                            Cognition Arousal/Alertness: Awake/alert Behavior During Therapy: WFL for tasks assessed/performed Overall Cognitive Status: Within Functional Limits for tasks assessed                                         Exercises      General Comments General comments (skin integrity, edema, etc.): Pt education provided on importance of sitting upright and OOB.  Encouraged pt to continue working on sitting EOB and getting to recliner with staff.  Education provided on use of drop arm recliner to A with her transfer due to NWB and her pain level. Also encouraged use of incentive spirometer.      Pertinent Vitals/Pain Pain Assessment: Faces Faces Pain Scale: Hurts whole lot Pain Location: "my whole body" Pain Descriptors / Indicators: Aching;Sore Pain Intervention(s): Limited activity within patient's tolerance;Monitored during session;Repositioned;PCA encouraged    Home Living                      Prior Function            PT Goals (current goals can now be found in the care plan section) Acute Rehab PT Goals PT Goal Formulation: With patient Progress towards PT goals: Not progressing toward goals - comment(increased pain since surgery 6/17)    Frequency  Min 3X/week      PT Plan Current plan remains appropriate    Co-evaluation              AM-PAC PT "6 Clicks" Mobility   Outcome Measure  Help needed turning from your back to your side while in a flat bed without using bedrails?: None Help needed moving from lying on your back to sitting on the side of a flat bed without using bedrails?: A Little Help needed moving to and from a bed to a chair (including a wheelchair)?: A Little Help needed standing up from a chair using your arms (e.g., wheelchair or bedside chair)?: A Little Help needed to walk in hospital room?: A Lot Help needed climbing 3-5 steps with a railing? : A Lot 6 Click Score: 17    End of Session   Activity Tolerance: Patient limited by pain Patient left: in bed;with call bell/phone within reach Nurse Communication: Other (comment)(okayed session) PT Visit Diagnosis: Difficulty in walking, not elsewhere classified (R26.2)      Time: 6010-9323 PT Time Calculation (min) (ACUTE ONLY): 24 min  Charges:  $Therapeutic Activity: 23-37 mins                     Santiago Glad L. Tamala Julian, [place] Pager [phone] 04/20/2019    Galen Manila 04/20/2019, 12:11 PM

## 2019-04-20 NOTE — Progress Notes (Signed)
2 Days Post-Op Hartman's resection and small bowel resection Subjective: Pain better, had a lot of flank pain last night Objective: Vital signs in last 24 hours: Temp:  [98 F (36.7 C)-98.8 F (37.1 C)] 98.8 F (37.1 C) (06/19 0544) Pulse Rate:  [96-118] 108 (06/19 0544) Resp:  [12-18] 14 (06/19 0821) BP: (98-121)/(66-75) 121/75 (06/19 0544) SpO2:  [99 %-100 %] 100 % (06/19 0821)   Intake/Output from previous day: 06/18 0701 - 06/19 0700 In: 891.1 [I.V.:699.1; IV Piggyback:192] Out: 3730 [Urine:2950; Emesis/NG output:650; Drains:130] Intake/Output this shift: No intake/output data recorded.   General appearance: alert and cooperative GI: soft, appropriately tender  Ostomy: beefy red Incision: no significant drainage  Lab Results:  Recent Labs    04/19/19 0412 04/20/19 0408  WBC 11.6* 11.3*  HGB 9.9* 8.6*  HCT 30.6* 27.0*  PLT 584* 532*   BMET Recent Labs    04/19/19 1848 04/20/19 0408  NA 137 137  K 3.9 4.0  CL 102 101  CO2 28 29  GLUCOSE 113* 116*  BUN 24* 27*  CREATININE 0.53 0.65  CALCIUM 8.1* 8.2*   PT/INR No results for input(s): LABPROT, INR in the last 72 hours. ABG No results for input(s): PHART, HCO3 in the last 72 hours.  Invalid input(s): PCO2, PO2  MEDS, Scheduled . enoxaparin (LOVENOX) injection  40 mg Subcutaneous Q24H  . fluticasone  1 spray Each Nare Daily  . HYDROmorphone   Intravenous Q4H  . pantoprazole (PROTONIX) IV  40 mg Intravenous QHS  . sodium chloride flush  10-40 mL Intracatheter Q12H    Studies/Results: Dg C-arm 1-60 Min-no Report  Result Date: 04/18/2019 Fluoroscopy was utilized by the requesting physician.  No radiographic interpretation.    Assessment: s/p Procedure(s): EXPLORATORY LAPAROTOMY, HARTMAN'S RESECTION AND SMALL BOWEL RESECTION WITH COLOSTOMY CYSTOSCOPY WITH STENT PLACEMENT Patient Active Problem List   Diagnosis Date Noted  . Perforated sigmoid colon  04/09/2019  . Colitis 04/05/2019  . Sepsis  ([place]) 04/05/2019  . Hypokalemia 04/05/2019  . H/O foot surgery 04/05/2019    Expected post op course  Plan: Cont NG  D/c foley this evening Cont Dilaudid PCA Cont Toradol   LOS: 15 days     .Rosario Adie, [facility], [place]   04/20/2019 9:25 AM

## 2019-04-20 NOTE — Progress Notes (Signed)
PROGRESS NOTE    Molly Beck  ZOX:[id number] DOB: 1966-01-10 DOA: 04/04/2019 PCP: Clementeen GrahamScifres, [name], [name]  Brief Narrative:  Molly Beck a 53 y.o.femalewith medical history significant ofGERD, elective surgery of her right foot a week ago presenting to the hospital for evaluation of abdominal pain.Patient states she had areconstructive right foot surgery done a week ago. States since then she has been constipated due to taking pain medications. Her last bowel movement was prior to the surgery. For the past few days she is having left lower quadrant abdominal pain which became worse for the past 24 hours. The pain is sharp and excruciating. States today she called her surgeon's office and was advised to take magnesium citrate for constipation. She vomited soon after taking magnesium citrate. Denies any fevers. She continues to complain of abdominal pain despite receiving a dose ofmorphine and multiple doses of fentanyl in the ED. CT abdomen pelvis showing acute sigmoid colitis with small foci of adjacent gas that may be extra luminal and may indicate microperforation. No abscess or drainable fluid collection. She was admitted for further treatment and care.  General surgery was consulted.  **Interim History Patient with improved abdominal discomfort overnight following insertion of NG tube with significant output.  Repeat imaging studies suggest continued small bowel obstruction.  Intra-abdominal drains continue to drain well.    Patient is continued on TPN because of lack of progression and because of concern for bilious drainage she underwent surgical intervention on 04/18/2019 and urology was consulted for bilateral stent placement and cystoscopy.  General surgery did exploratory laparotomy with small bowel resection and Hartman's procedure along with urology doing a cystoscopy with bilateral stent placement.  Today patient was in a lot of pain so surgery was admitted  Dilaudid PCA.  Patient was complaining of significant pressure in her bladder felt that she had a urinary tract infection however it turned out that her catheter was blocked and she is re-cath and 1 L was removed.  Urology recommending continuing Foley until general surgery feels fit to remove and this will be removed tonight  Assessment & Plan:   Principal Problem:   Perforated sigmoid colon  Active Problems:   Colitis   Sepsis (HCC)   Hypokalemia   H/O foot surgery  Sepsis secondary to Acute Sigmoid Colitis with Microperforation complicated by 2 Absecesses status post drain placement and now exploratory laparotomy with Hartman's procedure a small bowel resection along with cystoscopy and bilateral stent placement SBO Sepsis, present on admission and physiology is improving -Patient presenting to ED with progressive abdominal pain.  CT abdomen/pelvis notable for acute sigmoid colitis with adjacent gas likely secondary to microperforation.   -Patient was initially started on IV Zosyn.  During her initial hospitalization, her abdominal pain continued not to improve with resultant fevers and elevated white count.  Repeat CT abdomen/pelvis reveals marked worsening in the pelvis with 2 abscesses.  Interventional radiology was consulted and placed two drains on 04/10/2019.  -General surgery/IR following, appreciate assistance -Output past 24h: Right abdomen 280 mL's, buttock 30 mL's -Blood culture 04/05/2019 no growth x5 days -Wound culture 04/10/2019: Abundant mixed organisms, none predominant -Continue IV Zosyn for now been getting IV antibiotics since June 3.  Will defer to general surgery want to stop antibiotics at this time -NGT placement 04/16/2019 per general surgery; C/w LIWS -TPN per pharmacy -Continue to monitor drain output and BMs -Because of minimal improvement and now with green bile color in anterior abdominal drain, General Surgery recommending exploratory  laparotomy and patient  underwent a Hartman's procedure and small bowel resection; she is postoperative day 2 today -Urology Consulted by General Surgery for Bilateral Renal Stents and Cystoscopy; this was placed prior to her surgery; unfortunately today her catheter got blocked and so she was re-catheter and got out 1 L with minimal blood.  Urology recommending continuing Foley until surgery is okay with this and general surgery recommending discontinuing Foley catheter tomorrow -Last SARS-CoV-2 testing was negative on 04/05/2019 however this was repeated this morning -Currently getting TPN at 75 mL's per hour -KUB on 04/18/2019 showed "Improving small bowel obstruction. Contrast in the colon now reaches the rectum. Stable percutaneous drains in the pelvis."  -Continue supportive care and antiemetics -Pain control per general surgery as they adding a PCA with Dilaudid and continue Toradol for now  Rash -Maculopapular rash bilateral distribution on her back.  -Could be due to heat/sweat rash or contact from bedsheets.   -She has tolerated IV Zosyn for the past week without any signs of allergic reaction.  This does not appear to be an reaction to Zosyn at this time.   -Continue IV Benadryl and hydrocortisone cream prn.  -Continue hygiene and change of bed sheets daily  Recent right foot surgery and Tendon Repair -Currently in a cast, plans to follow-up with her orthopedic surgeon as an outpatient -PT following -Continue nonweightbearing right lower extremity  Hypokalemia -K+ was 4.0 this AM -Continue to Monitor and Replete as Necessary -Repeat CMP in AM  Normocytic Anmeia  -Patient's Hgb/Hct stable and is now 9.9/30.6 -> 8.6/27.0 -Anemia panel done and showed an iron level of 16, U IBC 189, TIBC of 205, saturation ratios of 8%, ferritin level 171, folate level 9.5, and vitamin B12 848 -Continue to Monitor for S/Sx of Bleeding; Currently no overt Bleeding  -Repeat CBC in AM   Hyperglycemia -Likely reactive  and in the setting of TPN; CBG's ranging from 100-118 -HbA1c was 4.9 on 04/14/2019 -Glucose this a.m. on CMP was 128 -CBG's ranging from 100-118  GERD -Continue with PPI IV with Pantoprazole 40 mg q24h until  able to take p.o.  Anxiety -Continue with Lorazepam 0.5 mg IV every 6 as needed for Anxiety  Obesity -Estimated body mass index is 30.84 kg/m as calculated from the following:   Height as of this encounter: 5\' 4"  (1.626 m).   Weight as of this encounter: 81.5 kg. -Weight Loss and Dietary Counseling given   Leukocytosis -In the setting of surgery and reactive -WBC went from 6.7 -> 11.6 -> 11.3 -Continue to monitor for signs and symptoms of infection -Repeat CBC in a.m.  Abnormal ALT -Patient's ALT was 45 and ? If elevated in the setting of surgery; now trended down and is 36 -If consistently elevated or worsening will obtain right upper quadrant ultrasound as well as acute hepatitis panel -Continue monitor and trend repeat CMP in a.m.  Thrombocytosis   -Likely reactive in the setting of antibiotic use -Patient's platelet count has been elevating and and is now trending down from 584 is not 532 Plan continue monitor and trend -Repeat CBC in AM   DVT prophylaxis: Enoxaparin 40 mg sq q24h Code Status: FULL CODE  Family Communication: No family present at bedside  Disposition Plan: Remain Inpatient as patient is going for Surgical Intervention    Consultants:   Interventional Radiology  General Surgery  Orthopedic Surgery   Urology   Procedures  IR drain placement x 2 04/10/2019  PICC RUE 04/10/2019  Surgical  Intervention and Cystoscopy by Dr. Maisie Fus and MacDiarmid   Antimicrobials:  Anti-infectives (From admission, onward)   Start     Dose/Rate Route Frequency Ordered Stop   04/16/19 0600  cefoTEtan (CEFOTAN) 2 g in sodium chloride 0.9 % 100 mL IVPB     2 g 200 mL/hr over 30 Minutes Intravenous On call to O.R. 04/15/19 1505 04/17/19 0559   04/05/19 0600   piperacillin-tazobactam (ZOSYN) IVPB 3.375 g     3.375 g 12.5 mL/hr over 240 Minutes Intravenous Every 8 hours 04/05/19 0121     04/05/19 0000  piperacillin-tazobactam (ZOSYN) IVPB 3.375 g     3.375 g 100 mL/hr over 30 Minutes Intravenous  Once 04/04/19 2355 04/05/19 0119     Subjective: Seen and examined at bedside and pain was better controlled today but still appears uncomfortable.  States that she did not sleep very well last night as she cannot get comfortable but now is feeling better.  No chest pain, lightheadedness or dizziness.  Hoping that the Foley catheter is removed later this evening.  No nausea or vomiting no other concerns at this time..  Objective: Vitals:   04/20/19 0821 04/20/19 1200 04/20/19 1344 04/20/19 1518  BP:   122/76   Pulse:   (!) 109   Resp: Temp:   98.4 F (36.9 C)   TempSrc:   Oral   SpO2: 100% 100% 100% 100%  Weight:      Height:        Intake/Output Summary (Last 24 hours) at 04/20/2019 1859 Last data filed at 04/20/2019 1800 Gross per 24 hour  Intake 1846.47 ml  Output 3280 ml  Net -1433.53 ml   Filed Weights   04/10/19 1059 04/16/19 1700 04/17/19 1050  Weight: 78.9 kg 79.7 kg 81.5 kg   Examination: Physical Exam:  Constitutional: Well-nourished, well-developed slightly obese Caucasian female who appears frustrated again but not as much as yesterday and appears slightly uncomfortable still but not as much as yesterday Eyes: Lids and conjunctive are normal.  Sclera anicteric ENMT: External ears nose appear normal.  Grossly normal hearing.  Has a NG tube in place hooked to suction Neck: Appears supple no JVD Respiratory: Slightly diminished auscultation bilaterally no patient wheezing coarse rhonchi.  Patient not tachypneic wheezing excess muscle breathe Cardiovascular: Regular rate and rhythm.  No appreciable murmurs, rubs or gallops. Abdomen: Soft, tender to palpate has an ostomy, midline honeycomb dressing as well as a drain  placement GU: Deferred but has a Foley catheter in place currently Musculoskeletal: Right foot is in a cast and bandages Skin: Skin is warm and dry no appreciable rashes or lesions on to skin evaluation Neurologic: Cranial nerves II through XII grossly intact no appreciable focal deficits Psychiatric: Patient is slightly agitated but not as much as yesterday.  Has a normal judgment and insight patient is awake, alert, oriented  Data Reviewed: I have personally reviewed following labs and imaging studies  CBC: Recent Labs  Lab 04/16/19 0323 04/17/19 0430 04/18/19 0324 04/19/19 0412 04/20/19 0408  WBC 6.8 7.6 6.7 11.6* 11.3*  NEUTROABS 5.2  --   --  9.1* 8.5*  HGB 9.4* 9.7* 9.8* 9.9* 8.6*  HCT 29.7* 30.0* 30.4* 30.6* 27.0*  MCV 95.2 95.2 95.0 94.7 95.1  PLT 480* 534* 540* 584* 532*   Basic Metabolic Panel: Recent Labs  Lab 04/16/19 0323 04/17/19 0430 04/18/19 0324 04/19/19 0412 04/19/19 1848 04/20/19 0408  NA 140 141 140 134* 137 137  K 4.1 3.7 3.9 4.4 3.9 4.0  CL 105 108 108 101 102 101  CO2 28 22 23 25 28 29   GLUCOSE 134* 132* 120* 128* 113* 116*  BUN 19 14 13  22* 24* 27*  CREATININE 0.53 0.47 0.43* 0.61 0.53 0.65  CALCIUM 8.3* 8.3* 8.2* 8.3* 8.1* 8.2*  MG 2.2 2.2 2.2 1.9  --  2.1  PHOS 4.0  --   --  3.3  --  4.5   GFR: Estimated Creatinine Clearance: 84.9 mL/min (by C-G formula based on SCr of 0.65 mg/dL). Liver Function Tests: Recent Labs  Lab 04/16/19 0323 04/19/19 0412 04/19/19 1848  AST 18 21 17   ALT 27 45* 36  ALKPHOS 72 84 85  BILITOT 0.3 0.8 0.6  PROT 6.1* 6.2* 6.0*  ALBUMIN 2.4* 2.5* 2.5*   No results for input(s): LIPASE, AMYLASE in the last 168 hours. No results for input(s): AMMONIA in the last 168 hours. Coagulation Profile: No results for input(s): INR, PROTIME in the last 168 hours. Cardiac Enzymes: No results for input(s): CKTOTAL, CKMB, CKMBINDEX, TROPONINI in the last 168 hours. BNP (last 3 results) No results for input(s): PROBNP in  the last 8760 hours. HbA1C: No results for input(s): HGBA1C in the last 72 hours. CBG: Recent Labs  Lab 04/16/19 1102 04/16/19 1632 04/17/19 0657 04/17/19 1101 04/17/19 1613  GLUCAP 119* 109* 118* 100* 114*   Lipid Profile: No results for input(s): CHOL, HDL, LDLCALC, TRIG, CHOLHDL, LDLDIRECT in the last 72 hours. Thyroid Function Tests: No results for input(s): TSH, T4TOTAL, FREET4, T3FREE, THYROIDAB in the last 72 hours. Anemia Panel: Recent Labs    04/19/19 0412  VITAMINB12 848  FOLATE 9.5  FERRITIN 171  TIBC 205*  IRON 16*  RETICCTPCT 1.4   Sepsis Labs: No results for input(s): PROCALCITON, LATICACIDVEN in the last 168 hours.  Recent Results (from the past 240 hour(s))  Novel Coronavirus, NAA (hospital order; send-out to ref lab)     Status: None   Collection Time: 04/18/19  8:45 AM   Specimen: Nasopharyngeal Swab; Respiratory  Result Value Ref Range Status   SARS-CoV-2, NAA NOT DETECTED NOT DETECTED Final    Comment: (NOTE) This test was developed and its performance characteristics determined by [facility]. This test has not been FDA cleared or approved. This test has been authorized by FDA under an Emergency Use Authorization (EUA). This test is only authorized for the duration of time the declaration that circumstances exist justifying the authorization of the emergency use of in vitro diagnostic tests for detection of SARS-CoV-2 virus and/or diagnosis of COVID-19 infection under section 564(b)(1) of the Act, 21 U.S.C. 409WJX-9(J)(4360bbb-3(b)(1), unless the authorization is terminated or revoked sooner. When diagnostic testing is negative, the possibility of a false negative result should be considered in the context of a patient's recent exposures and the presence of clinical signs and symptoms consistent with COVID-19. An individual without symptoms of COVID-19 and who is not shedding SARS-CoV-2 virus would expect to have a negative (not detected) result in  this assay. Performed  At: [facility] [address] [place], [place] [id number] Jolene SchimkeNagendra Sanjai MD YQ:[id number]    Coronavirus Source NASOPHARYNGEAL  Final    Comment: Performed at [facility], [address] [place], [place] [address]    Radiology Studies: No results found. Scheduled Meds:  enoxaparin (LOVENOX) injection  40 mg Subcutaneous Q24H   fluticasone  1 spray Each Nare Daily   HYDROmorphone   Intravenous Q4H  lip balm       pantoprazole (PROTONIX) IV  40 mg Intravenous QHS   sodium chloride flush  10-40 mL Intracatheter Q12H   Continuous Infusions:  methocarbamol (ROBAXIN) IV Stopped (04/20/19 1407)   piperacillin-tazobactam (ZOSYN)  IV 12.5 mL/hr at 04/20/19 1800   TPN ADULT (ION) 75 mL/hr at 04/20/19 1800    LOS: 15 days   Merlene Laughtermair Latif Porshea Janowski, DO [facility] PAGER is on AMION  If 7PM-7AM, please contact night-coverage [email or web address] Password [email or web address] 04/20/2019, 6:59 PM

## 2019-04-20 NOTE — Progress Notes (Signed)
Called to assess bilateral side pain last night that settled done Not flank Non-specific and unlikely not related to stents Continue to follow

## 2019-04-20 NOTE — Progress Notes (Signed)
PHARMACY - ADULT TOTAL PARENTERAL NUTRITION CONSULT NOTE   Pharmacy Consult for TPN Indication: intolerance to enteral feedings, sigmoid diverticulitis, SBO  Patient Measurements: Height: 5\' 4"  (162.6 cm) Weight: 179 lb 10.8 oz (81.5 kg) IBW/kg (Calculated) : 54.7 TPN AdjBW (KG): 61 Body mass index is 30.84 kg/m.  Current Nutrition: NPO, TPN  IVF: none  Central access:  PICC to be placed 6/9 TPN start date: 6/9  ASSESSMENT                                                                                                          HPI: 53 yo female with sigmoid diverticulitis with abscesses worsened since admission.  IR placed drain 6/9. Patient has been intolerant to enteral feedings for about a week so plan to place PICC line and start TPN per [facility]CS.  Significant events:  6/17: ex lap with small bowel resection & stent placement  Today, 04/20/19  Glucose - at goal < 150, CBGs & SSI d/c'd 6/16  Electrolytes - stable WNL  Renal - SCr 0.65 stable  LFTs - WNL  TGs - 91 (6/10), 54 (6/15)  Prealbumin - <5 (6/10), 8.3 (6/15)  NUTRITIONAL GOALS                                                                                             RD recs: Kcal/day: 2000-2240, protein/day: 95-105g  Custom TPN to provide 99g protein per day and 1915 kcal/day at goal rate of 75 ml/hr (96% of estimated needs per RD)  PLAN                                                                                                                         At 1800 today:  Continue custom TPN at goal rate of 75 ml/hr  TPN will contain standard electrolytes except: Na at 100, K at 90, Mag at 8 and max acetate.   TPN to contain standard multivitamins daily and trace elements on MWF due to national shortage  IVF per MD (currently none)  TPN lab panels on Mondays & Thursdays.     Netta Cedars, PharmD, BCPS 04/20/2019 10:09 AM

## 2019-04-20 NOTE — Consult Note (Signed)
[place] Nurse ostomy consult note Stoma type/location: LMQ colostomy. Temporary. Stomal assessment/size: 1 and 1/2 inch, round, edematous, red, budded, os at center Peristomal assessment: intact.  Medial edge is at umbilical crease. Treatment options for stomal/peristomal skin: skin barrier ring Output: none Ostomy pouching: 2pc. 2 and 1/4 inch pouching system with skin barrier ring Education provided: Explained role of ostomy nurse and creation of stoma  Explained stoma characteristics (budded, flush, color, texture, care) Demonstrated pouch change (cutting new skin barrier, measuring stoma, cleaning peristomal skin and stoma, use of barrier ring) Discussed risk of peristomal hernia Patient provided with educational folder with book and 1-page teaching tips on pouch change procedure, skin barrier rings and skin care. Answered patient questions    Note: patient is NWB due to foot.  Enrolled patient in [place] program: No  WOC nursing team will not follow, but will remain available to this patient, the nursing and medical teams.  Please re-consult if needed. Thanks, Maudie Flakes, MSN, RN, [place], Arther Abbott  Pager# [phone]

## 2019-04-21 LAB — CBC
HCT: 27 % — ABNORMAL LOW (ref 36.0–46.0)
Hemoglobin: 8.6 g/dL — ABNORMAL LOW (ref 12.0–15.0)
MCH: 30.6 pg (ref 26.0–34.0)
MCHC: 31.9 g/dL (ref 30.0–36.0)
MCV: 96.1 fL (ref 80.0–100.0)
Platelets: 581 10*3/uL — ABNORMAL HIGH (ref 150–400)
RBC: 2.81 MIL/uL — ABNORMAL LOW (ref 3.87–5.11)
RDW: 13.8 % (ref 11.5–15.5)
WBC: 8.1 10*3/uL (ref 4.0–10.5)
nRBC: 0 % (ref 0.0–0.2)

## 2019-04-21 LAB — BASIC METABOLIC PANEL
Anion gap: 8 (ref 5–15)
BUN: 26 mg/dL — ABNORMAL HIGH (ref 6–20)
CO2: 30 mmol/L (ref 22–32)
Calcium: 8.5 mg/dL — ABNORMAL LOW (ref 8.9–10.3)
Chloride: 105 mmol/L (ref 98–111)
Creatinine, Ser: 0.57 mg/dL (ref 0.44–1.00)
GFR calc Af Amer: >60 mL/min (ref >60)
GFR calc non Af Amer: >60 mL/min (ref >60)
Glucose, Bld: 128 mg/dL — ABNORMAL HIGH (ref 70–99)
Potassium: 3.7 mmol/L (ref 3.5–5.1)
Sodium: 143 mmol/L (ref 135–145)

## 2019-04-21 LAB — MAGNESIUM: Magnesium: 2.1 mg/dL (ref 1.7–2.4)

## 2019-04-21 LAB — GLUCOSE, CAPILLARY
Glucose-Capillary: 102 mg/dL — ABNORMAL HIGH (ref 70–99)
Glucose-Capillary: 88 mg/dL (ref 70–99)

## 2019-04-21 MED ORDER — HYDROMORPHONE 1 MG/ML IV SOLN
INTRAVENOUS | Status: DC
  Administered 2019-04-21: 3.5 mg via INTRAVENOUS
  Administered 2019-04-21: 4.3 mg via INTRAVENOUS
  Administered 2019-04-21: 2 mg via INTRAVENOUS
  Administered 2019-04-21: 4 mg via INTRAVENOUS
  Administered 2019-04-22 (×2): 5 mg via INTRAVENOUS
  Administered 2019-04-22: 2.5 mg via INTRAVENOUS
  Administered 2019-04-22: 3 mg via INTRAVENOUS
  Administered 2019-04-22: 5.5 mg via INTRAVENOUS
  Administered 2019-04-22: 30 mg via INTRAVENOUS
  Administered 2019-04-23: 2.5 mg via INTRAVENOUS
  Administered 2019-04-23: 30 mg via INTRAVENOUS
  Administered 2019-04-23 (×2): 3 mg via INTRAVENOUS
  Administered 2019-04-23: 4 mg via INTRAVENOUS
  Administered 2019-04-23: 3.5 mg via INTRAVENOUS
  Administered 2019-04-24: 1.5 mg via INTRAVENOUS
  Administered 2019-04-24: 0.5 mL via INTRAVENOUS
  Administered 2019-04-24: 2.5 mL via INTRAVENOUS
  Administered 2019-04-24: 2 mg via INTRAVENOUS
  Administered 2019-04-24: 1 mg via INTRAVENOUS
  Administered 2019-04-24: 1 mL via INTRAVENOUS
  Administered 2019-04-24: 2 mg via INTRAVENOUS
  Administered 2019-04-25: 4.5 mg via INTRAVENOUS
  Administered 2019-04-25: 1.5 mg via INTRAVENOUS
  Administered 2019-04-25: 30 mg via INTRAVENOUS
  Administered 2019-04-25: 0.5 mg via INTRAVENOUS
  Administered 2019-04-26: 2 mg via INTRAVENOUS
  Administered 2019-04-26: 1 mg via INTRAVENOUS
  Filled 2019-04-21 (×3): qty 30

## 2019-04-21 MED ORDER — LORAZEPAM 2 MG/ML IJ SOLN
0.2500 mg | Freq: Four times a day (QID) | INTRAMUSCULAR | Status: DC | PRN
  Administered 2019-04-21: 23:00:00 0.25 mg via INTRAVENOUS
  Administered 2019-04-23 – 2019-04-24 (×3): 0.5 mg via INTRAVENOUS
  Filled 2019-04-21 (×5): qty 1

## 2019-04-21 MED ORDER — MAGIC MOUTHWASH
15.0000 mL | Freq: Three times a day (TID) | ORAL | Status: AC
  Administered 2019-04-21 (×3): 15 mL via ORAL
  Filled 2019-04-21 (×3): qty 15

## 2019-04-21 MED ORDER — LIP MEDEX EX OINT
1.0000 "application " | TOPICAL_OINTMENT | Freq: Two times a day (BID) | CUTANEOUS | Status: DC
  Administered 2019-04-21 – 2019-05-02 (×21): 1 via TOPICAL
  Filled 2019-04-21: qty 7

## 2019-04-21 MED ORDER — NALOXONE HCL 0.4 MG/ML IJ SOLN: 0.4000 mg | INTRAMUSCULAR | Status: DC | PRN

## 2019-04-21 MED ORDER — MAGIC MOUTHWASH
15.0000 mL | Freq: Four times a day (QID) | ORAL | Status: DC | PRN
  Filled 2019-04-21: qty 15

## 2019-04-21 MED ORDER — SODIUM CHLORIDE 0.9% FLUSH: 9.0000 mL | INTRAVENOUS | Status: DC | PRN

## 2019-04-21 MED ORDER — METHOCARBAMOL 1000 MG/10ML IJ SOLN
1000.0000 mg | Freq: Three times a day (TID) | INTRAVENOUS | Status: DC
  Administered 2019-04-21 – 2019-04-30 (×26): 1000 mg via INTRAVENOUS
  Filled 2019-04-21 (×28): qty 10

## 2019-04-21 MED ORDER — KETOROLAC TROMETHAMINE 15 MG/ML IJ SOLN
15.0000 mg | Freq: Three times a day (TID) | INTRAMUSCULAR | Status: DC
  Administered 2019-04-21 – 2019-04-22 (×3): 15 mg via INTRAVENOUS
  Filled 2019-04-21 (×3): qty 1

## 2019-04-21 MED ORDER — HYDROMORPHONE 1 MG/ML IV SOLN: INTRAVENOUS | Status: DC

## 2019-04-21 MED ORDER — HYDROMORPHONE HCL 1 MG/ML IJ SOLN
1.0000 mg | INTRAMUSCULAR | Status: AC | PRN
  Administered 2019-04-21 (×2): 2 mg via INTRAVENOUS
  Filled 2019-04-21 (×3): qty 2

## 2019-04-21 MED ORDER — ONDANSETRON HCL 4 MG/2ML IJ SOLN: 4.0000 mg | Freq: Four times a day (QID) | INTRAMUSCULAR | Status: DC | PRN

## 2019-04-21 MED ORDER — TRAVASOL 10 % IV SOLN
INTRAVENOUS | Status: AC
  Administered 2019-04-21: 19:00:00 via INTRAVENOUS
  Filled 2019-04-21: qty 990

## 2019-04-21 NOTE — Progress Notes (Signed)
Patient ambulated from bed into hallway and back to room; 20 feet with RW and SBA x2. Patient weak and fatigued on return to room but is sitting up in chair now. Encouraged to continue PCA use to control pain. Will continue to monitor.

## 2019-04-21 NOTE — Progress Notes (Signed)
Molly ReiningCarol T Kilgore [id number] May 25, 1966  CARE TEAM:  PCP: Clementeen GrahamScifres, [name], [name]  Outpatient Care Team: Patient Care Team: Scifres, Peter Miniumorothy, [name] as PCP - General (Physician Assistant)  Inpatient Treatment Team: Treatment Team: Attending Provider: Montez Moritacs, Md, MD; Rounding Team: Montez Moritacs, Md, MD; Registered Nurse: Terie Purserixon, Maria, RN; Rounding Team: Massie Kluverriadhosp, Wl3, MD; Technician: Renda RollsHenson, Christie, NT; Registered Nurse: Teryl LucyWilfong, Felicia L, RN; Consulting Physician: Merlene LaughterSheikh, Omair Latif, DO; WOC Nurse: Teressa LowerMcNichol, Bonney AidLaurie L, RN; Consulting Physician: Alfredo MartinezMacDiarmid, Scott, MD; Technician: Kela MillinSeminario, Franco A, NT; Registered Nurse: Quentin CornwallMichaux, Deanna, RN   Problem List:   Principal Problem:   Perforated sigmoid colon  Active Problems:   Colitis   Sepsis (HCC)   Hypokalemia   H/O foot surgery   3 Days Post-Op  04/18/2019  POST-OPERATIVE DIAGNOSIS:  PERFORATION OF SMALL BOWEL BY CT GUIDED DRAIN, PERFORATED DIVERTICULITIS  PROCEDURE:  EXPLORATORY LAPAROTOMY, HARTMAN'S PROCEDURE, SMALL BOWEL RESECTION CYSTOSCOPY WITH STENT PLACEMENT  OR FINDINGS: CT-guided drain in the small bowel.  Other CT-guided drain in the pelvis draining a purulent fluid collection contaminated with stool.  Distal sigmoid diverticulitis.  Small bowel obstruction due to inflammation of small bowel.   Surgeon(s): Romie Leveehomas, Alicia, MD Alfredo MartinezMacDiarmid, Scott, MD  Assessment  Stabilizing status post percutaneous drainage and optimal bowel resection with colostomy for complicated perforated diverticulitis and small bowel erosion  [facility](Hospital Stay = 16 days)  Plan:  -Nasogastric tube decompression for prolonged ileus related to perforated colon and small bowel -IV antibiotics for 5 days postop, then reevaluate -Surgical drain -TNA for nutrition -Pain control.  Inadequate.  Increase dose of Dilaudid on PCA while extending interval.  Scheduled Robaxin and Toradol for 48 hours then reevaluate.  Patient usually on chronic NSAIDs and  narcotics -Ureteral stenting per urology.  Most likely remove in future.  Urinating with Foley catheter out -Anxiolysis.  IV Ativan as needed instead of usual oral Xanax with ileus -VTE prophylaxis- SCDs, etc -mobilize as tolerated to help recovery  25 minutes spent in review, evaluation, examination, counseling, and coordination of care.  More than 50% of that time was spent in counseling.  04/21/2019    Subjective: (Chief complaint)  Still with significant pain especially in central abdomen.  Urinating with Foley catheter out.  Denies nausea.  Objective:  Vital signs:  Vitals:   04/20/19 2303 04/21/19 0050 04/21/19 0358 04/21/19 0608  BP:  111/67  117/76  Pulse:  (!) 120  (!) 101  Resp: 16 16 13 15   Temp:  98.6 F (37 C)  98.4 F (36.9 C)  TempSrc:  Oral  Oral  SpO2: 100% 100% 99% 99%  Weight:      Height:        Last BM Date: (unsure- colostomy placed 04-18-19)  Intake/Output   Yesterday:  06/19 0701 - 06/20 0700 In: 1758.8 [I.V.:1478.9; IV Piggyback:279.9] Out: 3040 [Urine:2350; Emesis/NG output:600; Drains:90] This shift:  No intake/output data recorded.  Bowel function:  Flatus: No  BM:  No  Drain: Serosanguinous   Physical Exam:  General: Pt awake/alert/oriented x4 in mild acute distress Eyes: PERRL, normal EOM.  Sclera clear.  No icterus Neuro: CN II-XII intact w/o focal sensory/motor deficits. Lymph: No head/neck/groin lymphadenopathy Psych:  No delerium/psychosis/paranoia HENT: Normocephalic, Mucus membranes moist.  No thrush Neck: Supple, No tracheal deviation Chest: No chest wall pain w good excursion CV:  Pulses intact.  Regular rhythm MS: Normal AROM mjr joints.  No obvious deformity  Abdomen: Somewhat firm.  Mildy distended.  Tenderness at Central abdomen along incision.  Dressings clean dry and intact.  No cellulitis.  No evidence of peritonitis.  No incarcerated hernias.  Ext:   No deformity.  No mjr edema.  No cyanosis Skin: No  petechiae / purpura  Results:   Cultures: Recent Results (from the past 720 hour(s))  SARS Coronavirus 2 (CEPHEID - Performed in [facility] hospital lab), Hosp Order     Status: None   Collection Time: 04/05/19 12:14 AM   Specimen: Nasopharyngeal Swab  Result Value Ref Range Status   SARS Coronavirus 2 NEGATIVE NEGATIVE Final    Comment: (NOTE) If result is NEGATIVE SARS-CoV-2 target nucleic acids are NOT DETECTED. The SARS-CoV-2 RNA is generally detectable in upper and lower  respiratory specimens during the acute phase of infection. The lowest  concentration of SARS-CoV-2 viral copies this assay can detect is 250  copies / mL. A negative result does not preclude SARS-CoV-2 infection  and should not be used as the sole basis for treatment or other  patient management decisions.  A negative result may occur with  improper specimen collection / handling, submission of specimen other  than nasopharyngeal swab, presence of viral mutation(s) within the  areas targeted by this assay, and inadequate number of viral copies  (<250 copies / mL). A negative result must be combined with clinical  observations, patient history, and epidemiological information. If result is POSITIVE SARS-CoV-2 target nucleic acids are DETECTED. The SARS-CoV-2 RNA is generally detectable in upper and lower  respiratory specimens dur ing the acute phase of infection.  Positive  results are indicative of active infection with SARS-CoV-2.  Clinical  correlation with patient history and other diagnostic information is  necessary to determine patient infection status.  Positive results do  not rule out bacterial infection or co-infection with other viruses. If result is PRESUMPTIVE POSTIVE SARS-CoV-2 nucleic acids MAY BE PRESENT.   A presumptive positive result was obtained on the submitted specimen  and confirmed on repeat testing.  While 2019 novel coronavirus  (SARS-CoV-2) nucleic acids may be present in the  submitted sample  additional confirmatory testing may be necessary for epidemiological  and / or clinical management purposes  to differentiate between  SARS-CoV-2 and other Sarbecovirus currently known to infect humans.  If clinically indicated additional testing with an alternate test  methodology [phone]) is advised. The SARS-CoV-2 RNA is generally  detectable in upper and lower respiratory sp ecimens during the acute  phase of infection. The expected result is Negative. Fact Sheet for Patients:  BoilerBrush.com.cy Fact Sheet for Healthcare Providers: https://pope.com/ This test is not yet approved or cleared by the [place] FDA and has been authorized for detection and/or diagnosis of SARS-CoV-2 by FDA under an Emergency Use Authorization (EUA).  This EUA will remain in effect (meaning this test can be used) for the duration of the COVID-19 declaration under Section 564(b)(1) of the Act, 21 U.S.C. section 360bbb-3(b)(1), unless the authorization is terminated or revoked sooner. Performed at [facility], [address], [place], [place] [address]   Culture, blood (routine x 2)     Status: None   Collection Time: 04/05/19  1:10 AM   Specimen: BLOOD RIGHT HAND  Result Value Ref Range Status   Specimen Description   Final    BLOOD RIGHT HAND Performed at [facility], [address] [place], [place] [address]    Special Requests   Final    BOTTLES DRAWN AEROBIC AND ANAEROBIC Blood Culture results may not be optimal  due to an excessive volume of blood received in culture bottles Performed at [facility], [address] [place], [place] [address]    Culture   Final    NO GROWTH 5 DAYS Performed at [facility], [address] [place], [place] [address]    Report Status 04/10/2019 FINAL  Final  Culture, blood (routine x 2)     Status: None   Collection Time: 04/05/19  2:53  AM   Specimen: BLOOD  Result Value Ref Range Status   Specimen Description   Final    BLOOD RIGHT ANTECUBITAL Performed at [facility], [address] [place], [place] [address]    Special Requests   Final    BOTTLES DRAWN AEROBIC AND ANAEROBIC Blood Culture results may not be optimal due to an excessive volume of blood received in culture bottles Performed at [facility], [address] [place], [place] [address]    Culture   Final    NO GROWTH 5 DAYS Performed at [facility], [address] [place], [place] [address]    Report Status 04/10/2019 FINAL  Final  Aerobic/Anaerobic Culture (surgical/deep wound)     Status: None   Collection Time: 04/10/19  2:05 PM   Specimen: Abscess  Result Value Ref Range Status   Specimen Description   Final    ABSCESS DIVERTICULAR Performed at [facility], [address] [place], [place] [address]    Special Requests   Final    Normal Performed at [facility], [address] [place], [place] [address]    Gram Stain   Final    ABUNDANT WBC PRESENT, PREDOMINANTLY PMN ABUNDANT GRAM NEGATIVE RODS ABUNDANT GRAM POSITIVE COCCI FEW GRAM VARIABLE ROD Performed at [facility], [address] [place], [place] [address]    Culture   Final    ABUNDANT MULTIPLE ORGANISMS PRESENT, NONE PREDOMINANT ABUNDANT MIXED ANAEROBIC FLORA PRESENT.  CALL LAB IF FURTHER IID REQUIRED.    Report Status 04/13/2019 FINAL  Final  Aerobic/Anaerobic Culture (surgical/deep wound)     Status: None   Collection Time: 04/10/19  2:06 PM   Specimen: Abscess  Result Value Ref Range Status   Specimen Description   Final    ABSCESS PELVIC Performed at [facility], [address] [place], [place] [address]    Special Requests   Final    Normal Performed at [facility], [address] [place], [place] [address]    Gram Stain   Final     ABUNDANT WBC PRESENT,BOTH PMN AND MONONUCLEAR ABUNDANT GRAM NEGATIVE RODS MODERATE GRAM POSITIVE COCCI MODERATE GRAM VARIABLE ROD Performed at [facility], [address] [place], [place] [address]    Culture   Final    ABUNDANT MULTIPLE ORGANISMS PRESENT, NONE PREDOMINANT ABUNDANT MIXED ANAEROBIC FLORA PRESENT.  CALL LAB IF FURTHER IID REQUIRED.    Report Status 04/13/2019 FINAL  Final  Novel Coronavirus, NAA (hospital order; send-out to ref lab)     Status: None   Collection Time: 04/18/19  8:45 AM   Specimen: Nasopharyngeal Swab; Respiratory  Result Value Ref Range Status   SARS-CoV-2, NAA NOT DETECTED NOT DETECTED Final    Comment: (NOTE) This test was developed and its performance characteristics determined by [facility]. This test has not been FDA cleared or approved. This test has been authorized by FDA under an Emergency Use Authorization (EUA).  This test is only authorized for the duration of time the declaration that circumstances exist justifying the authorization of the emergency use of in vitro diagnostic tests for detection of SARS-CoV-2 virus and/or diagnosis of COVID-19 infection under section 564(b)(1) of the Act, 21 U.S.C. 161WRU-0(A)(5360bbb-3(b)(1), unless the authorization is terminated or revoked sooner. When diagnostic testing is negative, the possibility of a false negative result should be considered in the context of a patient's recent exposures and the presence of clinical signs and symptoms consistent with COVID-19. An individual without symptoms of COVID-19 and who is not shedding SARS-CoV-2 virus would expect to have a negative (not detected) result in this assay. Performed  At: [facility] [address] [place], [place] [id number] Jolene SchimkeNagendra Sanjai MD NW:[id number]:[phone]    Coronavirus Source NASOPHARYNGEAL  Final    Comment: Performed at [facility], [address] [place], [place] [address]    Labs: Results for  orders placed or performed during the hospital encounter of 04/04/19 (from the past 48 hour(s))  Comprehensive metabolic panel     Status: Abnormal   Collection Time: 04/19/19  6:48 PM  Result Value Ref Range   Sodium 137 135 - 145 mmol/L   Potassium 3.9 3.5 - 5.1 mmol/L   Chloride 102 98 - 111 mmol/L   CO2 28 22 - 32 mmol/L   Glucose, Bld 113 (H) 70 - 99 mg/dL   BUN 24 (H) 6 - 20 mg/dL   Creatinine, Ser 9.620.53 0.44 - 1.00 mg/dL   Calcium 8.1 (L) 8.9 - 10.3 mg/dL   Total Protein 6.0 (L) 6.5 - 8.1 g/dL   Albumin 2.5 (L) 3.5 - 5.0 g/dL   AST 17 15 - 41 U/L   ALT 36 0 - 44 U/L   Alkaline Phosphatase 85 38 - 126 U/L   Total Bilirubin 0.6 0.3 - 1.2 mg/dL   GFR calc non Af Amer >60 >60 mL/min   GFR calc Af Amer >60 >60 mL/min   Anion gap 7 5 - 15    Comment: Performed at [facility], [address] [place], [place] [address]  Basic metabolic panel     Status: Abnormal   Collection Time: 04/20/19  4:08 AM  Result Value Ref Range   Sodium 137 135 - 145 mmol/L   Potassium 4.0 3.5 - 5.1 mmol/L   Chloride 101 98 - 111 mmol/L   CO2 29 22 - 32 mmol/L   Glucose, Bld 116 (H) 70 - 99 mg/dL   BUN 27 (H) 6 - 20 mg/dL   Creatinine, Ser 1.320.65 0.44 - 1.00 mg/dL   Calcium 8.2 (L) 8.9 - 10.3 mg/dL   GFR calc non Af Amer >60 >60 mL/min   GFR calc Af Amer >60 >60 mL/min   Anion gap 7 5 - 15    Comment: Performed at [facility], [address] [place], [place] [address]  Magnesium     Status: None   Collection Time: 04/20/19  4:08 AM  Result Value Ref Range   Magnesium 2.1 1.7 - 2.4 mg/dL    Comment: Performed at [facility], [address] [place], [place] [address]  CBC with Differential/Platelet     Status: Abnormal   Collection Time: 04/20/19  4:08 AM  Result Value Ref Range   WBC 11.3 (H) 4.0 - 10.5 K/uL   RBC 2.84 (L) 3.87 - 5.11 MIL/uL   Hemoglobin 8.6 (L) 12.0 - 15.0 g/dL   HCT  27.0 (L) 36.0 - 46.0 %   MCV 95.1 80.0 - 100.0 fL    MCH 30.3 26.0 - 34.0 pg   MCHC 31.9 30.0 - 36.0 g/dL   RDW 13.6 11.5 - 15.5 %   Platelets 532 (H) 150 - 400 K/uL   nRBC 0.0 0.0 - 0.2 %   Neutrophils Relative % 76 %   Neutro Abs 8.5 (H) 1.7 - 7.7 K/uL   Lymphocytes Relative 13 %   Lymphs Abs 1.5 0.7 - 4.0 K/uL   Monocytes Relative 9 %   Monocytes Absolute 1.0 0.1 - 1.0 K/uL   Eosinophils Relative 2 %   Eosinophils Absolute 0.2 0.0 - 0.5 K/uL   Basophils Relative 0 %   Basophils Absolute 0.1 0.0 - 0.1 K/uL   Immature Granulocytes 0 %   Abs Immature Granulocytes 0.05 0.00 - 0.07 K/uL    Comment: Performed at [facility], [place] [address], [place], [place] [address]  Phosphorus     Status: None   Collection Time: 04/20/19  4:08 AM  Result Value Ref Range   Phosphorus 4.5 2.5 - 4.6 mg/dL    Comment: Performed at [facility], [place] [address], [place], [place] [address]    Imaging / Studies: No results found.  Medications / Allergies: per chart  Antibiotics: Anti-infectives (From admission, onward)   Start     Dose/Rate Route Frequency Ordered Stop   04/16/19 0600  cefoTEtan (CEFOTAN) 2 g in sodium chloride 0.9 % 100 mL IVPB     2 g 200 mL/hr over 30 Minutes Intravenous On call to O.R. 04/15/19 1505 04/17/19 0559   04/05/19 0600  piperacillin-tazobactam (ZOSYN) IVPB 3.375 g     3.375 g 12.5 mL/hr over 240 Minutes Intravenous Every 8 hours 04/05/19 0121     04/05/19 0000  piperacillin-tazobactam (ZOSYN) IVPB 3.375 g     3.375 g 100 mL/hr over 30 Minutes Intravenous  Once 04/04/19 2355 04/05/19 0119        Note: Portions of this report may have been transcribed using voice recognition software. Every effort was made to ensure accuracy; however, inadvertent computerized transcription errors may be present.   Any transcriptional errors that result from this process are unintentional.     Adin Hector, MD, FACS, MASCRS Gastrointestinal and Minimally Invasive Surgery    [address], [place],  [address] [phone] Main / Paging [phone] Fax

## 2019-04-21 NOTE — Progress Notes (Signed)
PHARMACY - ADULT TOTAL PARENTERAL NUTRITION CONSULT NOTE   Pharmacy Consult for TPN Indication: intolerance to enteral feedings, sigmoid diverticulitis, SBO  Patient Measurements: Height: 5\' 4"  (162.6 cm) Weight: 179 lb 10.8 oz (81.5 kg) IBW/kg (Calculated) : 54.7 TPN AdjBW (KG): 61 Body mass index is 30.84 kg/m.  Current Nutrition: NPO, TPN  IVF: none  Central access:  PICC to be placed 6/9 TPN start date: 6/9  ASSESSMENT                                                                                                          HPI: 53 yo female with sigmoid diverticulitis with abscesses worsened since admission.  IR placed drain 6/9. Patient has been intolerant to enteral feedings for about a week so plan to place PICC line and start TPN per CCS.  Significant events:  6/17: ex lap with small bowel resection & stent placement  Today, 04/21/19  Glucose - at goal < 150, CBGs & SSI d/c'd 6/16  Electrolytes - stable WNL (6/19)  Renal - SCr 0.65 stable  LFTs - WNL  TGs - 91 (6/10), 54 (6/15)  Prealbumin - <5 (6/10), 8.3 (6/15)  NUTRITIONAL GOALS                                                                                             RD recs: Kcal/day: 2000-2240, protein/day: 95-105g  Custom TPN to provide 99g protein per day and 1915 kcal/day at goal rate of 75 ml/hr (96% of estimated needs per RD)  PLAN                                                                                                                         At 1800 today:  Continue custom TPN at goal rate of 75 ml/hr  TPN will contain standard electrolytes except: Na at 100, K at 90, Mag at 8 and max acetate.   TPN to contain standard multivitamins daily and trace elements on MWF due to national shortage  IVF per MD (currently none)  TPN lab panels on Mondays & Thursdays.     Netta Cedars, PharmD, BCPS 04/21/2019 9:45 AM

## 2019-04-21 NOTE — Progress Notes (Signed)
PROGRESS NOTE    Molly Beck  AVW:[id number] DOB: [birth year] DOA: 04/04/2019 PCP: Clementeen GrahamScifres, [name], [name]  Brief Narrative:  Molly Beck a 53 y.o.femalewith medical history significant ofGERD, elective surgery of her right foot a week ago presenting to the hospital for evaluation of abdominal pain.Patient states she had areconstructive right foot surgery done a week ago. States since then she has been constipated due to taking pain medications. Her last bowel movement was prior to the surgery. For the past few days she is having left lower quadrant abdominal pain which became worse for the past 24 hours. The pain is sharp and excruciating. States today she called her surgeon's office and was advised to take magnesium citrate for constipation. She vomited soon after taking magnesium citrate. Denies any fevers. She continues to complain of abdominal pain despite receiving a dose ofmorphine and multiple doses of fentanyl in the ED. CT abdomen pelvis showing acute sigmoid colitis with small foci of adjacent gas that may be extra luminal and may indicate microperforation. No abscess or drainable fluid collection. She was admitted for further treatment and care.  General surgery was consulted.  **Interim History Patient with improved abdominal discomfort overnight following insertion of NG tube with significant output.  Repeat imaging studies suggest continued small bowel obstruction.  Intra-abdominal drains continue to drain well.    Patient is continued on TPN because of lack of progression and because of concern for bilious drainage she underwent surgical intervention on 04/18/2019 and urology was consulted for bilateral stent placement and cystoscopy.  General surgery did exploratory laparotomy with small bowel resection and Hartman's procedure along with urology doing a cystoscopy with bilateral stent placement.  Today patient was in a lot of pain so surgery was admitted  Dilaudid PCA.  Patient was complaining of significant pressure in her bladder felt that she had a urinary tract infection however it turned out that her catheter was blocked and she is re-cath and 1 L was removed.    Foley catheter was removed last night and pain medications are being adjusted and increased today.  Patient going to be ambulating later today.  Assessment & Plan:   Principal Problem:   Perforated sigmoid colon  Active Problems:   Colitis   Sepsis (HCC)   Hypokalemia   H/O foot surgery  Sepsis secondary to Acute Sigmoid Colitis with Microperforation complicated by 2 Absecesses status post drain placement and now exploratory laparotomy with Hartman's procedure a small bowel resection along with cystoscopy and bilateral stent placement SBO Sepsis, present on admission and physiology is improving -Patient presenting to ED with progressive abdominal pain.  CT abdomen/pelvis notable for acute sigmoid colitis with adjacent gas likely secondary to microperforation.   -Patient was initially started on IV Zosyn.  During her initial hospitalization, her abdominal pain continued not to improve with resultant fevers and elevated white count.  Repeat CT abdomen/pelvis reveals marked worsening in the pelvis with 2 abscesses.  Interventional radiology was consulted and placed two drains on 04/10/2019.  -General surgery/IR following, appreciate assistance -Output past 24h: Right abdomen 280 mL's, buttock 30 mL's -Blood culture 04/05/2019 no growth x5 days -Wound culture 04/10/2019: Abundant mixed organisms, none predominant -Continue IV Zosyn for now been getting IV antibiotics since June 3.  Will defer to general surgery want to stop antibiotics at this time -NGT placement 04/16/2019 per general surgery; C/w LIWS -TPN per pharmacy -Continue to monitor drain output and BMs -Because of minimal improvement and now with green bile color  in anterior abdominal drain, General Surgery recommending  exploratory laparotomy and patient underwent a Hartman's procedure and small bowel resection; she is postoperative day 2 today -Urology Consulted by General Surgery for Bilateral Renal Stents and Cystoscopy; this was placed prior to her surgery; unfortunately today her catheter got blocked and so she was re-catheter and got out 1 L with minimal blood.  Urology recommending continuing Foley until surgery is okay with this and general surgery recommending discontinuing Foley catheter tomorrow -Last SARS-CoV-2 testing was negative on 04/05/2019 however this was repeated this morning -Currently getting TPN at 75 mL's per hour -KUB on 04/18/2019 showed "Improving small bowel obstruction. Contrast in the colon now reaches the rectum. Stable percutaneous drains in the pelvis."  -Continue supportive care and antiemetics -Pain control per general surgery as they adding a PCA with Dilaudid and continue Toradol for now; PCA medications have been increased for better pain control  Rash -Maculopapular rash bilateral distribution on her back.  -Could be due to heat/sweat rash or contact from bedsheets.   -She has tolerated IV Zosyn for the past week without any signs of allergic reaction.  This does not appear to be an reaction to Zosyn at this time.   -Continue IV Benadryl and hydrocortisone cream prn.  -Continue hygiene and change of bed sheets daily  Recent right foot surgery and Tendon Repair -Currently in a cast, plans to follow-up with her orthopedic surgeon as an outpatient -PT following -Continue nonweightbearing right lower extremity  Hypokalemia -K+ was 4.0 this AM -Continue to Monitor and Replete as Necessary -Repeat CMP in AM  Normocytic Anmeia  -Patient's Hgb/Hct stable and is now 9.9/30.6 -> 8.6/27.0  -Anemia panel done and showed an iron level of 16, U IBC 189, TIBC of 205, saturation ratios of 8%, ferritin level 171, folate level 9.5, and vitamin B12 848 -Continue to Monitor for S/Sx of  Bleeding; Currently no overt Bleeding  -Repeat CBC in AM   Hyperglycemia -Likely reactive and in the setting of TPN; CBG's ranging from 100-118 -HbA1c was 4.9 on 04/14/2019 -Glucose this a.m. on CMP was 128 -CBG's ranging from 100-118  GERD -Continue with PPI IV with Pantoprazole 40 mg q24h until  able to take p.o.  Anxiety -Continue with Lorazepam 0.5 mg IV every 6 as needed for Anxiety  Obesity -Estimated body mass index is 30.84 kg/m as calculated from the following:   Height as of this encounter:  (1.626 m).   Weight as of this encounter: 81.5 kg. -Weight Loss and Dietary Counseling given   Leukocytosis -In the setting of surgery and reactive -WBC went from 6.7 -> 11.6 -> 11.3 -> 8.1 -Continue to monitor for signs and symptoms of infection -Repeat CBC in a.m.  Abnormal ALT -Patient's ALT was 45 and ? If elevated in the setting of surgery; now trended down and is 36 and has not been repeated now -If consistently elevated or worsening will obtain right upper quadrant ultrasound as well as acute hepatitis panel -Continue monitor and trend repeat CMP in a.m.  Thrombocytosis   -Likely reactive in the setting of antibiotic use -Platelet count is now 581,000 Plan continue monitor and trend -Repeat CBC in AM   DVT prophylaxis: Enoxaparin 40 mg sq q24h Code Status: FULL CODE  Family Communication: No family present at bedside  Disposition Plan: Remain Inpatient as patient is going for Surgical Intervention    Consultants:   Interventional Radiology  General Surgery  Orthopedic Surgery   Urology  Procedures  IR drain placement x 2 04/10/2019  PICC RUE 04/10/2019  Surgical Intervention and Cystoscopy by Dr. Maisie Fushomas and MacDiarmid   Antimicrobials:  Anti-infectives (From admission, onward)   Start     Dose/Rate Route Frequency Ordered Stop   04/16/19 0600  cefoTEtan (CEFOTAN) 2 g in sodium chloride 0.9 % 100 mL IVPB     2 g 200 mL/hr over 30 Minutes  Intravenous On call to O.R. 04/15/19 1505 04/17/19 0559   04/05/19 0600  piperacillin-tazobactam (ZOSYN) IVPB 3.375 g     3.375 g 12.5 mL/hr over 240 Minutes Intravenous Every 8 hours 04/05/19 0121 04/23/19 0900   04/05/19 0000  piperacillin-tazobactam (ZOSYN) IVPB 3.375 g     3.375 g 100 mL/hr over 30 Minutes Intravenous  Once 04/04/19 2355 04/05/19 0119     Subjective: Seen and examined at bedside and she had a better mood today.  Hoping to ambulate but felt that her pain is still uncontrolled.  Did not sleep as well last night.  No nausea or vomiting.  No other concerns or plans at this time..  Objective: Vitals:   04/21/19 1208 04/21/19 1418 04/21/19 1550 04/21/19 1802  BP:  127/80  131/83  Pulse:  (!) 102  (!) 116  Resp: 10 (!) 9 10 18   Temp:  98.9 F (37.2 C)  98.4 F (36.9 C)  TempSrc:  Oral  Oral  SpO2: 100%  100% 100%  Weight:      Height:        Intake/Output Summary (Last 24 hours) at 04/21/2019 1911 Last data filed at 04/21/2019 1800 Gross per 24 hour  Intake 2035.25 ml  Output 1840 ml  Net 195.25 ml   Filed Weights   04/10/19 1059 04/16/19 1700 04/17/19 1050  Weight: 78.9 kg 79.7 kg 81.5 kg   Examination: Physical Exam:  Constitutional: Well-nourished, well-developed slightly obese Caucasian female who appears better and is wanting to ambulate. Respiratory: Slight diminished auscultation bilaterally no appreciable wheezing, rales, rhonchi Cardiovascular: Regular rate and rhythm.  No appreciable murmurs, rubs, gallops Abdomen: Soft, tender to palpate and has an ostomy midline honeycomb dressing as well as a drain in place GU: Deferred and Foley catheter is been removed Neurologic: Cranial nerves II through XII gross intact no appreciable focal deficits Psychiatric: Not as agitated today.  Has a normal judgment and insight.  She is awake, alert, oriented  Data Reviewed: I have personally reviewed following labs and imaging studies  CBC: Recent Labs  Lab  04/16/19 0323 04/17/19 0430 04/18/19 0324 04/19/19 0412 04/20/19 0408 04/21/19 1039  WBC 6.8 7.6 6.7 11.6* 11.3* 8.1  NEUTROABS 5.2  --   --  9.1* 8.5*  --   HGB 9.4* 9.7* 9.8* 9.9* 8.6* 8.6*  HCT 29.7* 30.0* 30.4* 30.6* 27.0* 27.0*  MCV 95.2 95.2 95.0 94.7 95.1 96.1  PLT 480* 534* 540* 584* 532* 581*   Basic Metabolic Panel: Recent Labs  Lab 04/16/19 0323 04/17/19 0430 04/18/19 0324 04/19/19 0412 04/19/19 1848 04/20/19 0408 04/21/19 1039  NA 140 141 140 134* 137 137 143  K 4.1 3.7 3.9 4.4 3.9 4.0 3.7  CL 105 108 108 101 102 101 105  CO2 28 22 23 25 28 29 30   GLUCOSE 134* 132* 120* 128* 113* 116* 128*  BUN 19 14 13  22* 24* 27* 26*  CREATININE 0.53 0.47 0.43* 0.61 0.53 0.65 0.57  CALCIUM 8.3* 8.3* 8.2* 8.3* 8.1* 8.2* 8.5*  MG 2.2 2.2 2.2 1.9  --  2.1  2.1  PHOS 4.0  --   --  3.3  --  4.5  --    GFR: Estimated Creatinine Clearance: 84.9 mL/min (by C-G formula based on SCr of 0.57 mg/dL). Liver Function Tests: Recent Labs  Lab 04/16/19 0323 04/19/19 0412 04/19/19 1848  AST 18 21 17   ALT 27 45* 36  ALKPHOS 72 84 85  BILITOT 0.3 0.8 0.6  PROT 6.1* 6.2* 6.0*  ALBUMIN 2.4* 2.5* 2.5*   No results for input(s): LIPASE, AMYLASE in the last 168 hours. No results for input(s): AMMONIA in the last 168 hours. Coagulation Profile: No results for input(s): INR, PROTIME in the last 168 hours. Cardiac Enzymes: No results for input(s): CKTOTAL, CKMB, CKMBINDEX, TROPONINI in the last 168 hours. BNP (last 3 results) No results for input(s): PROBNP in the last 8760 hours. HbA1C: No results for input(s): HGBA1C in the last 72 hours. CBG: Recent Labs  Lab 04/16/19 1102 04/16/19 1632 04/17/19 0657 04/17/19 1101 04/17/19 1613  GLUCAP 119* 109* 118* 100* 114*   Lipid Profile: No results for input(s): CHOL, HDL, LDLCALC, TRIG, CHOLHDL, LDLDIRECT in the last 72 hours. Thyroid Function Tests: No results for input(s): TSH, T4TOTAL, FREET4, T3FREE, THYROIDAB in the last 72  hours. Anemia Panel: Recent Labs    04/19/19 0412  VITAMINB12 848  FOLATE 9.5  FERRITIN 171  TIBC 205*  IRON 16*  RETICCTPCT 1.4   Sepsis Labs: No results for input(s): PROCALCITON, LATICACIDVEN in the last 168 hours.  Recent Results (from the past 240 hour(s))  Novel Coronavirus, NAA (hospital order; send-out to ref lab)     Status: None   Collection Time: 04/18/19  8:45 AM   Specimen: Nasopharyngeal Swab; Respiratory  Result Value Ref Range Status   SARS-CoV-2, NAA NOT DETECTED NOT DETECTED Final    Comment: (NOTE) This test was developed and its performance characteristics determined by [employer]. This test has not been FDA cleared or approved. This test has been authorized by FDA under an Emergency Use Authorization (EUA). This test is only authorized for the duration of time the declaration that circumstances exist justifying the authorization of the emergency use of in vitro diagnostic tests for detection of SARS-CoV-2 virus and/or diagnosis of COVID-19 infection under section 564(b)(1) of the Act, 21 U.S.C. 355DDU-2(G)(2), unless the authorization is terminated or revoked sooner. When diagnostic testing is negative, the possibility of a false negative result should be considered in the context of a patient's recent exposures and the presence of clinical signs and symptoms consistent with COVID-19. An individual without symptoms of COVID-19 and who is not shedding SARS-CoV-2 virus would expect to have a negative (not detected) result in this assay. Performed  At: [facility] [place], [place] [id number] Rush Farmer MD SE:[id number]    [place]  Final    Comment: Performed at [facility] [address], [place], [place] [address]    Radiology Studies: No results found. Scheduled Meds: . enoxaparin (LOVENOX) injection  40 mg Subcutaneous Q24H  . fluticasone  1 spray Each Nare Daily   . HYDROmorphone   Intravenous Q4H  . ketorolac  15 mg Intravenous Q8H  . lip balm  1 application Topical BID  . magic mouthwash  15 mL Oral TID  . pantoprazole (PROTONIX) IV  40 mg Intravenous QHS  . sodium chloride flush  10-40 mL Intracatheter Q12H   Continuous Infusions: . methocarbamol (ROBAXIN) IV Stopped (04/21/19 1238)  . piperacillin-tazobactam (ZOSYN)  IV  Stopped (04/21/19 1754)  . TPN ADULT (ION) 75 mL/hr at 04/21/19 1846    LOS: 16 days   Merlene Laughtermair Latif , DO [facility] PAGER is on AMION  If 7PM-7AM, please contact night-coverage [email or web address] Password [email or web address] 04/21/2019, 7:11 PM

## 2019-04-21 NOTE — Plan of Care (Signed)
Patient lying in bed this morning; complaining of pain 5-6/10 when lying still in bed. When up complains of pain 9-10/10. Will continue to monitor.

## 2019-04-21 NOTE — Progress Notes (Signed)
Patient complaining of pain 6-7/10; has not used very much on PCA in last 4 hours. Encouraged to continue to use PCA to control pain; administered 2mg  IV Dilaudid to achieve adequate pain control. JP drain stripped; tubing free of clots. NG tube flushed with air bolus; both ports. Honeycomb dressing removed; wicking removed; area clean and dry; closed w/ staples. No drainage. Has been OOB to chair for 2+ hours earlier today; now back in bed. Discussed need to get up and ambulate with RW and assist; pt states as soon as pain under control again she will be ready to ambulate. Will continue to monitor.

## 2019-04-22 LAB — BASIC METABOLIC PANEL
Anion gap: 6 (ref 5–15)
BUN: 36 mg/dL — ABNORMAL HIGH (ref 6–20)
CO2: 34 mmol/L — ABNORMAL HIGH (ref 22–32)
Calcium: 8.6 mg/dL — ABNORMAL LOW (ref 8.9–10.3)
Chloride: 104 mmol/L (ref 98–111)
Creatinine, Ser: 0.82 mg/dL (ref 0.44–1.00)
GFR calc Af Amer: >60 mL/min (ref >60)
GFR calc non Af Amer: >60 mL/min (ref >60)
Glucose, Bld: 118 mg/dL — ABNORMAL HIGH (ref 70–99)
Potassium: 3.6 mmol/L (ref 3.5–5.1)
Sodium: 144 mmol/L (ref 135–145)

## 2019-04-22 LAB — CBC
HCT: 27.1 % — ABNORMAL LOW (ref 36.0–46.0)
Hemoglobin: 8.5 g/dL — ABNORMAL LOW (ref 12.0–15.0)
MCH: 30.4 pg (ref 26.0–34.0)
MCHC: 31.4 g/dL (ref 30.0–36.0)
MCV: 96.8 fL (ref 80.0–100.0)
Platelets: 615 10*3/uL — ABNORMAL HIGH (ref 150–400)
RBC: 2.8 MIL/uL — ABNORMAL LOW (ref 3.87–5.11)
RDW: 13.7 % (ref 11.5–15.5)
WBC: 7.8 10*3/uL (ref 4.0–10.5)
nRBC: 0 % (ref 0.0–0.2)

## 2019-04-22 LAB — GLUCOSE, CAPILLARY
Glucose-Capillary: 98 mg/dL (ref 70–99)
Glucose-Capillary: 102 mg/dL — ABNORMAL HIGH (ref 70–99)
Glucose-Capillary: 94 mg/dL (ref 70–99)
Glucose-Capillary: 88 mg/dL (ref 70–99)
Glucose-Capillary: 108 mg/dL — ABNORMAL HIGH (ref 70–99)

## 2019-04-22 MED ORDER — SODIUM CHLORIDE 0.9 % IV SOLN
500.0000 mg | Freq: Once | INTRAVENOUS | Status: AC
  Administered 2019-04-22: 500 mg via INTRAVENOUS
  Filled 2019-04-22: qty 10

## 2019-04-22 MED ORDER — TRAVASOL 10 % IV SOLN
INTRAVENOUS | Status: AC
  Administered 2019-04-22: 19:00:00 via INTRAVENOUS
  Filled 2019-04-22: qty 990

## 2019-04-22 MED ORDER — KETOROLAC TROMETHAMINE 15 MG/ML IJ SOLN: 15.0000 mg | Freq: Four times a day (QID) | INTRAMUSCULAR | Status: DC

## 2019-04-22 MED ORDER — KETOROLAC TROMETHAMINE 15 MG/ML IJ SOLN: 30.0000 mg | Freq: Three times a day (TID) | INTRAMUSCULAR | Status: DC

## 2019-04-22 MED ORDER — SODIUM CHLORIDE 0.9 % IV SOLN
25.0000 mg | Freq: Once | INTRAVENOUS | Status: AC
  Administered 2019-04-22: 25 mg via INTRAVENOUS
  Filled 2019-04-22: qty 0.5

## 2019-04-22 MED ORDER — KETOROLAC TROMETHAMINE 15 MG/ML IJ SOLN
30.0000 mg | Freq: Three times a day (TID) | INTRAMUSCULAR | Status: AC
  Administered 2019-04-22 – 2019-04-24 (×6): 30 mg via INTRAVENOUS
  Filled 2019-04-22 (×8): qty 2

## 2019-04-22 NOTE — Progress Notes (Signed)
PHARMACY - ADULT TOTAL PARENTERAL NUTRITION CONSULT NOTE   Pharmacy Consult for TPN Indication: intolerance to enteral feedings, sigmoid diverticulitis, SBO  Patient Measurements: Height: 5\' 4"  (162.6 cm) Weight: 179 lb 10.8 oz (81.5 kg) IBW/kg (Calculated) : 54.7 TPN AdjBW (KG): 61 Body mass index is 30.84 kg/m.  Current Nutrition: NPO, TPN  IVF: none  Central access:  PICC to be placed 6/9 TPN start date: 6/9  ASSESSMENT                                                                                                          HPI: 53 yo female with sigmoid diverticulitis with abscesses worsened since admission.  IR placed drain 6/9. Patient has been intolerant to enteral feedings for about a week so plan to place PICC line and start TPN per CCS.  Significant events:  6/17: ex lap with small bowel resection & stent placement  Today, 04/22/19  Glucose - at goal < 150, CBGs & SSI d/c'd 6/16  Electrolytes - stable WNL except CO2 now increased 118  Renal - SCr 0.82 stable  LFTs - WNL  TGs - 91 (6/10), 54 (6/15)  Prealbumin - <5 (6/10), 8.3 (6/15)  NUTRITIONAL GOALS                                                                                             RD recs: Kcal/day: 2000-2240, protein/day: 95-105g  Custom TPN to provide 99g protein per day and 1915 kcal/day at goal rate of 75 ml/hr (96% of estimated needs per RD)  PLAN                                                                                                                         At 1800 today:  Continue custom TPN at goal rate of 75 ml/hr  TPN will contain standard electrolytes except: Na at 100, K at 90, Mag at 8 and change RC:VELFYBO ratio to 1:2.   TPN to contain standard multivitamins daily and trace elements on MWF due to national shortage  IVF per MD (currently none)  TPN lab panels on Mondays & Thursdays.     Netta Cedars,  PharmD, BCPS 04/22/2019 10:54 AM

## 2019-04-22 NOTE — Progress Notes (Signed)
PROGRESS NOTE    Molly Beck  ZOX:[id number] DOB: 04/20/1966 DOA: 04/04/2019 PCP: Clementeen GrahamScifres, [name], [name]  Brief Narrative:  Molly CandleCarol T Liberatoreis a 53 y.o.femalewith medical history significant ofGERD, elective surgery of her right foot a week ago presenting to the hospital for evaluation of abdominal pain.Patient states she had areconstructive right foot surgery done a week ago. States since then she has been constipated due to taking pain medications. Her last bowel movement was prior to the surgery. For the past few days she is having left lower quadrant abdominal pain which became worse for the past 24 hours. The pain is sharp and excruciating. States today she called her surgeon's office and was advised to take magnesium citrate for constipation. She vomited soon after taking magnesium citrate. Denies any fevers. She continues to complain of abdominal pain despite receiving a dose ofmorphine and multiple doses of fentanyl in the ED. CT abdomen pelvis showing acute sigmoid colitis with small foci of adjacent gas that may be extra luminal and may indicate microperforation. No abscess or drainable fluid collection. She was admitted for further treatment and care.  General surgery was consulted.  **Interim History Patient with improved abdominal discomfort overnight following insertion of NG tube with significant output.  Repeat imaging studies suggest continued small bowel obstruction.  Intra-abdominal drains continue to drain well.    Patient is continued on TPN because of lack of progression and because of concern for bilious drainage she underwent surgical intervention on 04/18/2019 and urology was consulted for bilateral stent placement and cystoscopy.  General surgery did exploratory laparotomy with small bowel resection and Hartman's procedure along with urology doing a cystoscopy with bilateral stent placement.  Today patient was in a lot of pain so surgery was admitted  Dilaudid PCA.  Patient was complaining of significant pressure in her bladder felt that she had a urinary tract infection however it turned out that her catheter was blocked and she is re-cath and 1 L was removed.    Foley catheter was removed the night before last and pain medications are being adjusted and increased.  Patient undergoing NG tube clamping today and has been ambulating the halls.  Better controlled with the PCA Dilaudid.  Surgery recommending IV antibiotics for 5 more days currently and then reevaluating.  Will likely need to have her ureteral stenting removed eventually.  Continue TNA nutrition.  Assessment & Plan:   Principal Problem:   Perforated sigmoid colon  Active Problems:   Colitis   Sepsis (HCC)   Hypokalemia   H/O foot surgery  Sepsis secondary to Acute Sigmoid Colitis with Microperforation complicated by 2 Absecesses status post drain placement and now exploratory laparotomy with Hartman's procedure a small bowel resection along with cystoscopy and bilateral stent placement SBO Sepsis, present on admission and physiology is improving -Patient presenting to ED with progressive abdominal pain.  CT abdomen/pelvis notable for acute sigmoid colitis with adjacent gas likely secondary to microperforation.   -Patient was initially started on IV Zosyn.  During her initial hospitalization, her abdominal pain continued not to improve with resultant fevers and elevated white count.  Repeat CT abdomen/pelvis reveals marked worsening in the pelvis with 2 abscesses.  Interventional radiology was consulted and placed two drains on 04/10/2019.  -General surgery/IR following, appreciate assistance -Output past 24h: Right abdomen 280 mL's, buttock 30 mL's -Blood culture 04/05/2019 no growth x5 days -Wound culture 04/10/2019: Abundant mixed organisms, none predominant -Continue IV Zosyn for now been getting IV antibiotics since June 3.  Will defer to general surgery want to stop antibiotics  at this time recommend IV antibiotics for 5 days postoperatively and then reevaluating -NGT placement 04/16/2019 per general surgery; C/w LIWS -TPN per pharmacy -Continue to monitor drain output and BMs -Because of minimal improvement and now with green bile color in anterior abdominal drain, General Surgery recommending exploratory laparotomy and patient underwent a Hartman's procedure and small bowel resection; she is postoperative day 4 today -Urology Consulted by General Surgery for Bilateral Renal Stents and Cystoscopy; Foley catheter was replaced and now has been removed and will need her ureteral stenting removed in the outpatient setting -Last SARS-CoV-2 testing was negative on 04/05/2019 however this was repeated this morning -Currently getting TPN at 75 mL's per hour -KUB on 04/18/2019 showed "Improving small bowel obstruction. Contrast in the colon now reaches the rectum. Stable percutaneous drains in the pelvis."  -Continue supportive care and antiemetics -Pain control per general surgery as they adding a PCA with Dilaudid and continue Toradol for now; PCA medications have been increased for better pain control  Rash -Maculopapular rash bilateral distribution on her back.  -Could be due to heat/sweat rash or contact from bedsheets.   -She has tolerated IV Zosyn for the past week without any signs of allergic reaction.  This does not appear to be an reaction to Zosyn at this time.   -Continue IV Benadryl and hydrocortisone cream prn.  -Continue hygiene and change of bed sheets daily  Recent right foot surgery and Tendon Repair -Currently in a cast, plans to follow-up with her orthopedic surgeon as an outpatient -PT following -Continue nonweightbearing right lower extremity  Hypokalemia -K+ was 3.6 this AM -Continue to Monitor and Replete as Necessary -Repeat CMP in AM  Normocytic Anmeia  -Patient's Hgb/Hct stable and is now 9.9/30.6 -> 8.6/27.0 -> 8.5/27.1 -Anemia panel done  and showed an iron level of 16, U IBC 189, TIBC of 205, saturation ratios of 8%, ferritin level 171, folate level 9.5, and vitamin B12 848 -Continue to Monitor for S/Sx of Bleeding; Currently no overt Bleeding  -Repeat CBC in AM   Hyperglycemia -Likely reactive and in the setting of TPN; CBG's ranging from 100-118 -HbA1c was 4.9 on 04/14/2019 -Glucose this a.m. on CMP was 128 -CBG's ranging from 100-118  GERD -Continue with PPI IV with Pantoprazole 40 mg q24h until  able to take p.o.  Anxiety -Continue with Lorazepam 0.5 mg IV every 6 as needed for Anxiety  Obesity -Estimated body mass index is 30.84 kg/m as calculated from the following:   Height as of this encounter: 5\' 4"  (1.626 m).   Weight as of this encounter: 81.5 kg. -Weight Loss and Dietary Counseling given   Leukocytosis -In the setting of surgery and reactive -WBC went from 6.7 -> 11.6 -> 11.3 -> 8.1 -> 7.8 -Continue to monitor for signs and symptoms of infection -Repeat CBC in a.m.  Abnormal ALT -Patient's ALT was 45 and ? If elevated in the setting of surgery; now trended down and is 36 and has not been repeated now -If consistently elevated or worsening will obtain right upper quadrant ultrasound as well as acute hepatitis panel -Continue monitor and trend repeat CMP in a.m.  Thrombocytosis   -Likely reactive in the setting of antibiotic use -Platelet count is now 615,000 -Continue monitor and trend -Repeat CBC in AM   DVT prophylaxis: Enoxaparin 40 mg sq q24h Code Status: FULL CODE  Family Communication: No family present at bedside  Disposition Plan: Remain  Inpatient as patient is going for Surgical Intervention    Consultants:   Interventional Radiology  General Surgery  Orthopedic Surgery   Urology   Procedures  IR drain placement x 2 04/10/2019  PICC RUE 04/10/2019  Surgical Intervention and Cystoscopy by Dr. Maisie Fus and MacDiarmid   Antimicrobials:  Anti-infectives (From admission,  onward)   Start     Dose/Rate Route Frequency Ordered Stop   04/16/19 0600  cefoTEtan (CEFOTAN) 2 g in sodium chloride 0.9 % 100 mL IVPB     2 g 200 mL/hr over 30 Minutes Intravenous On call to O.R. 04/15/19 1505 04/17/19 0559   04/05/19 0600  piperacillin-tazobactam (ZOSYN) IVPB 3.375 g     3.375 g 12.5 mL/hr over 240 Minutes Intravenous Every 8 hours 04/05/19 0121 04/23/19 0900   04/05/19 0000  piperacillin-tazobactam (ZOSYN) IVPB 3.375 g     3.375 g 100 mL/hr over 30 Minutes Intravenous  Once 04/04/19 2355 04/05/19 0119     Subjective: Seen and examined at bedside and was feeling better and hopeful for her NG tube to be removed.  Denied any chest pain, lightheadedness or dizziness.  Ambulated the halls.  States that she did not eat much sleep last night.  No other concerns or complaints at this time  Objective: Vitals:   04/22/19 0843 04/22/19 1121 04/22/19 1237 04/22/19 1555  BP:  109/84  123/79  Pulse:  (!) 121  (!) 109  Resp: Temp:  98.3 F (36.8 C)  98.6 F (37 C)  TempSrc:  Oral  Oral  SpO2: 100% 99% 99% 92%  Weight:      Height:        Intake/Output Summary (Last 24 hours) at 04/22/2019 1642 Last data filed at 04/22/2019 1400 Gross per 24 hour  Intake 2011.9 ml  Output 1850 ml  Net 161.9 ml   Filed Weights   04/10/19 1059 04/16/19 1700 04/17/19 1050  Weight: 78.9 kg 79.7 kg 81.5 kg   Examination: Physical Exam:  Constitutional: Well-nourished, well-developed slightly obese Caucasian female currently who is improving and had just come back from ambulating and sitting in a chair bedside Respiratory: Mildly diminished to auscultation bilaterally no appreciable wheezing, rales, rhonchi but does have a nasal cannula and for her PCA pump Cardiovascular: Regular rate and rhythm.  No appreciable murmurs, rubs or gallops Abdomen: Soft, somewhat tender to palpation.  Had an ostomy with a midline honeycomb dressing that is covered as well as a drain in  place GU: Deferred and Foley catheter has been removed now Neurologic: Cranial nerves II to XII gross intact no appreciable focal deficit Psychiatric: Irritated or frustrated today.  Has a normal judgment and insight.  She is awake, alert, oriented  Data Reviewed: I have personally reviewed following labs and imaging studies  CBC: Recent Labs  Lab 04/16/19 0323  04/18/19 0324 04/19/19 0412 04/20/19 0408 04/21/19 1039 04/22/19 0319  WBC 6.8   < > 6.7 11.6* 11.3* 8.1 7.8  NEUTROABS 5.2  --   --  9.1* 8.5*  --   --   HGB 9.4*   < > 9.8* 9.9* 8.6* 8.6* 8.5*  HCT 29.7*   < > 30.4* 30.6* 27.0* 27.0* 27.1*  MCV 95.2   < > 95.0 94.7 95.1 96.1 96.8  PLT 480*   < > 540* 584* 532* 581* 615*   < > = values in this interval not displayed.   Basic Metabolic Panel: Recent Labs  Lab 04/16/19 0323  04/17/19 0430 04/18/19 0324 04/19/19 0412 04/19/19 1848 04/20/19 0408 04/21/19 1039 04/22/19 0319  NA 140 141 140 134* 137 137 143 144  K 4.1 3.7 3.9 4.4 3.9 4.0 3.7 3.6  CL 105 108 108 101 102 101 105 104  CO2 28 22 23 25 28 29 30  34*  GLUCOSE 134* 132* 120* 128* 113* 116* 128* 118*  BUN 19 14 13  22* 24* 27* 26* 36*  CREATININE 0.53 0.47 0.43* 0.61 0.53 0.65 0.57 0.82  CALCIUM 8.3* 8.3* 8.2* 8.3* 8.1* 8.2* 8.5* 8.6*  MG 2.2 2.2 2.2 1.9  --  2.1 2.1  --   PHOS 4.0  --   --  3.3  --  4.5  --   --    GFR: Estimated Creatinine Clearance: 82.9 mL/min (by C-G formula based on SCr of 0.82 mg/dL). Liver Function Tests: Recent Labs  Lab 04/16/19 0323 04/19/19 0412 04/19/19 1848  AST 18 21 17   ALT 27 45* 36  ALKPHOS 72 84 85  BILITOT 0.3 0.8 0.6  PROT 6.1* 6.2* 6.0*  ALBUMIN 2.4* 2.5* 2.5*   No results for input(s): LIPASE, AMYLASE in the last 168 hours. No results for input(s): AMMONIA in the last 168 hours. Coagulation Profile: No results for input(s): INR, PROTIME in the last 168 hours. Cardiac Enzymes: No results for input(s): CKTOTAL, CKMB, CKMBINDEX, TROPONINI in the last 168  hours. BNP (last 3 results) No results for input(s): PROBNP in the last 8760 hours. HbA1C: No results for input(s): HGBA1C in the last 72 hours. CBG: Recent Labs  Lab 04/21/19 2329 04/22/19 0329 04/22/19 0718 04/22/19 1116 04/22/19 1550  GLUCAP 88 98 102* 94 88   Lipid Profile: No results for input(s): CHOL, HDL, LDLCALC, TRIG, CHOLHDL, LDLDIRECT in the last 72 hours. Thyroid Function Tests: No results for input(s): TSH, T4TOTAL, FREET4, T3FREE, THYROIDAB in the last 72 hours. Anemia Panel: No results for input(s): VITAMINB12, FOLATE, FERRITIN, TIBC, IRON, RETICCTPCT in the last 72 hours. Sepsis Labs: No results for input(s): PROCALCITON, LATICACIDVEN in the last 168 hours.  Recent Results (from the past 240 hour(s))  Novel Coronavirus, NAA (hospital order; send-out to ref lab)     Status: None   Collection Time: 04/18/19  8:45 AM   Specimen: Nasopharyngeal Swab; Respiratory  Result Value Ref Range Status   SARS-CoV-2, NAA NOT DETECTED NOT DETECTED Final    Comment: (NOTE) This test was developed and its performance characteristics determined by [employer]. This test has not been FDA cleared or approved. This test has been authorized by FDA under an Emergency Use Authorization (EUA). This test is only authorized for the duration of time the declaration that circumstances exist justifying the authorization of the emergency use of in vitro diagnostic tests for detection of SARS-CoV-2 virus and/or diagnosis of COVID-19 infection under section 564(b)(1) of the Act, 21 U.S.C. 759FMB-8(G)(6), unless the authorization is terminated or revoked sooner. When diagnostic testing is negative, the possibility of a false negative result should be considered in the context of a patient's recent exposures and the presence of clinical signs and symptoms consistent with COVID-19. An individual without symptoms of COVID-19 and who is not shedding SARS-CoV-2 virus would expect  to have a negative (not detected) result in this assay. Performed  At: [facility] [place], [place] [id number] Rush Farmer MD XB:[id number]    [place]  Final    Comment: Performed at [facility] [address] [place], [place] [address]  Radiology Studies: No results found. Scheduled Meds:  enoxaparin (LOVENOX) injection  40 mg Subcutaneous Q24H   fluticasone  1 spray Each Nare Daily   HYDROmorphone   Intravenous Q4H   ketorolac  30 mg Intravenous Q8H   lip balm  1 application Topical BID   pantoprazole (PROTONIX) IV  40 mg Intravenous QHS   sodium chloride flush  10-40 mL Intracatheter Q12H   Continuous Infusions:  methocarbamol (ROBAXIN) IV Stopped (04/22/19 [id number])   piperacillin-tazobactam (ZOSYN)  IV Stopped (04/22/19 1049)   TPN ADULT (ION) 75 mL/hr at 04/22/19 1400   TPN ADULT (ION)      LOS: 17 days   Merlene Laughtermair Latif Kaleena Corrow, DO [facility] PAGER is on AMION  If 7PM-7AM, please contact night-coverage [email or web address] Password [email or web address] 04/22/2019, 4:42 PM

## 2019-04-22 NOTE — Progress Notes (Signed)
Ambulated from bed into hall with return to chair; 25 feet total w/ RW and 2 SBA. Tolerated well. Pain controlled with PCA. Plan to sit up in chair until lunch then ambulate again prior to returning to bed.

## 2019-04-22 NOTE — Progress Notes (Signed)
Has been up in chair for 3 hours. NG tube clamped x3 hours as well. No nausea. Ambulated 25 ft in hall w/ RW and 1 person SBA; tolerated well. Pain controlled w/ PCA. Patient return to bed after ambulation. NG to suction. Encourage PCA use for continued ability to ambulate and tolerate sitting in chair. Will continue to monitor.

## 2019-04-22 NOTE — Progress Notes (Signed)
Molly Beck [id number] 1966-04-11  CARE TEAM:  PCP: Maude Leriche, [name]  Outpatient Care Team: Patient Care Team: Scifres, Durel Salts as PCP - General (Physician Assistant)  Inpatient Treatment Team: Treatment Team: Attending Provider: Edison Pace, Md, MD; Rounding Team: Edison Pace, Md, MD; Registered Nurse: Drucilla Chalet, RN; Rounding Team: Garner Gavel, MD; Technician: Candie Mile, NT; Registered Nurse: Eli Hose, RN; Consulting Physician: Kerney Elbe, DO; Williams Nurse: McNichol, Rudi Heap, RN; Consulting Physician: Bjorn Loser, MD; Technician: Sharren Bridge, NT; Registered Nurse: Petra Kuba, RN; Registered Nurse: Johna Sheriff, RN   Problem List:   Principal Problem:   Perforated sigmoid colon  Active Problems:   Colitis   Sepsis ([place])   Hypokalemia   H/O foot surgery   4 Days Post-Op  04/18/2019  POST-OPERATIVE DIAGNOSIS:  PERFORATION OF SMALL BOWEL BY CT GUIDED DRAIN, PERFORATED DIVERTICULITIS  PROCEDURE:  EXPLORATORY LAPAROTOMY, HARTMAN'S PROCEDURE, SMALL BOWEL RESECTION CYSTOSCOPY WITH STENT PLACEMENT  OR FINDINGS: CT-guided drain in the small bowel.  Other CT-guided drain in the pelvis draining a purulent fluid collection contaminated with stool.  Distal sigmoid diverticulitis.  Small bowel obstruction due to inflammation of small bowel.   Surgeon(s): Leighton Ruff, MD Bjorn Loser, MD  Assessment  Stabilizing status post percutaneous drainage and optimal bowel resection with colostomy for complicated perforated diverticulitis and small bowel erosion  [facility] Stay = 17 days)  Plan:  -Nasogastric tube decompression for prolonged ileus related to perforated colon and small bowel  -IV antibiotics for 5 days postop, then reevaluate  -Surgical drain  -TNA for nutrition  -Pain control.  Better but still struggling to mobilize.  Continue Dilaudid PCA.  Scheduled Robaxin and Toradol for 48 hours then reevaluate.   Increase Toradol dose and she feels that helps the most.  Follow hemoglobin creatinine on Toradol.  Keep it to 48 hours.  Patient usually on chronic NSAIDs and narcotics  -Ureteral stenting per urology.  Most likely remove in future.  Urinating with Foley catheter out  -Anxiolysis.  IV Ativan as needed instead of usual oral Xanax with ileus  -VTE prophylaxis- SCDs, etc  -mobilize as tolerated to help recovery  -Follow-up on pathology.  Diverticulitis presumptive diagnosis  25 minutes spent in review, evaluation, examination, counseling, and coordination of care.  More than 50% of that time was spent in counseling.  04/22/2019    Subjective: (Chief complaint)  Still has occasional sharp pain but better controlled overall.  Was not using PCA much yesterday but using it more this morning and feeling better.  She feels Toradol helps the most.  Able to get up to chair and at least get to the doorway.  Objective:  Vital signs:  Vitals:   04/22/19 0300 04/22/19 0332 04/22/19 0333 04/22/19 0742  BP:  96/67 107/74 114/72  Pulse:  (!) 115 (!) 105 100  Resp: 14 16  18   Temp:  98.7 F (37.1 C)  98.4 F (36.9 C)  TempSrc:  Oral  Oral  SpO2: 99% 100%  100%  Weight:      Height:        Last BM Date: (unsure- colostomy placed 04-18-19)  Intake/Output   Yesterday:  06/20 0701 - 06/21 0700 In: 2125 [I.V.:1808.1; IV Piggyback:316.9] Out: 1560 [Urine:1100; Emesis/NG output:300; Drains:160] This shift:  No intake/output data recorded.  Bowel function:  Flatus: No  BM:  No  Drain: Serosanguinous   Physical Exam:  General: Pt awake/alert/oriented x4 in no acute distress Eyes: PERRL, normal EOM.  Sclera clear.  No icterus Neuro: CN II-XII intact w/o focal sensory/motor deficits. Lymph: No head/neck/groin lymphadenopathy Psych:  No delerium/psychosis/paranoia HENT: Normocephalic, Mucus membranes moist.  No thrush Neck: Supple, No tracheal deviation Chest: No chest wall pain  w good excursion CV:  Pulses intact.  Regular rhythm MS: Normal AROM mjr joints.  No obvious deformity  Abdomen: Soft.  Mildy distended.  Tenderness at Central abdomen along incision.  Incision clean dry and intact.  Left-sided colostomy pink.  No gas or stool.  No cellulitis.  No evidence of peritonitis.  No incarcerated hernias.  Ext:   No deformity.  No mjr edema.  No cyanosis Skin: No petechiae / purpura  Results:   Cultures: Recent Results (from the past 720 hour(s))  SARS Coronavirus 2 (CEPHEID - Performed in [facility] hospital lab), Hosp Order     Status: None   Collection Time: 04/05/19 12:14 AM   Specimen: Nasopharyngeal Swab  Result Value Ref Range Status   SARS Coronavirus 2 NEGATIVE NEGATIVE Final    Comment: (NOTE) If result is NEGATIVE SARS-CoV-2 target nucleic acids are NOT DETECTED. The SARS-CoV-2 RNA is generally detectable in upper and lower  respiratory specimens during the acute phase of infection. The lowest  concentration of SARS-CoV-2 viral copies this assay can detect is 250  copies / mL. A negative result does not preclude SARS-CoV-2 infection  and should not be used as the sole basis for treatment or other  patient management decisions.  A negative result may occur with  improper specimen collection / handling, submission of specimen other  than nasopharyngeal swab, presence of viral mutation(s) within the  areas targeted by this assay, and inadequate number of viral copies  (<250 copies / mL). A negative result must be combined with clinical  observations, patient history, and epidemiological information. If result is POSITIVE SARS-CoV-2 target nucleic acids are DETECTED. The SARS-CoV-2 RNA is generally detectable in upper and lower  respiratory specimens dur ing the acute phase of infection.  Positive  results are indicative of active infection with SARS-CoV-2.  Clinical  correlation with patient history and other diagnostic information is   necessary to determine patient infection status.  Positive results do  not rule out bacterial infection or co-infection with other viruses. If result is PRESUMPTIVE POSTIVE SARS-CoV-2 nucleic acids MAY BE PRESENT.   A presumptive positive result was obtained on the submitted specimen  and confirmed on repeat testing.  While 2019 novel coronavirus  (SARS-CoV-2) nucleic acids may be present in the submitted sample  additional confirmatory testing may be necessary for epidemiological  and / or clinical management purposes  to differentiate between  SARS-CoV-2 and other Sarbecovirus currently known to infect humans.  If clinically indicated additional testing with an alternate test  methodology [phone]([id number]) is advised. The SARS-CoV-2 RNA is generally  detectable in upper and lower respiratory sp ecimens during the acute  phase of infection. The expected result is Negative. Fact Sheet for Patients:  BoilerBrush.com.cyhttps://www.fda.gov/media/136312/download Fact Sheet for Healthcare Providers: https://pope.com/https://www.fda.gov/media/136313/download This test is not yet approved or cleared by the [place] FDA and has been authorized for detection and/or diagnosis of SARS-CoV-2 by FDA under an Emergency Use Authorization (EUA).  This EUA will remain in effect (meaning this test can be used) for the duration of the COVID-19 declaration under Section 564(b)(1) of the Act, 21 U.S.C. section 360bbb-3(b)(1), unless the authorization is terminated or revoked sooner. Performed at [facility], [address] [place], [place] [address]  Culture, blood (routine x 2)     Status: None   Collection Time: 04/05/19  1:10 AM   Specimen: BLOOD RIGHT HAND  Result Value Ref Range Status   Specimen Description   Final    BLOOD RIGHT HAND Performed at [facility], [address] [place], [place] [address]    Special Requests   Final    BOTTLES DRAWN AEROBIC AND ANAEROBIC Blood Culture results may not be  optimal due to an excessive volume of blood received in culture bottles Performed at [facility], [address] [place], [place] [address]    Culture   Final    NO GROWTH 5 DAYS Performed at [facility], [address] [place], [place] [address]    Report Status 04/10/2019 FINAL  Final  Culture, blood (routine x 2)     Status: None   Collection Time: 04/05/19  2:53 AM   Specimen: BLOOD  Result Value Ref Range Status   Specimen Description   Final    BLOOD RIGHT ANTECUBITAL Performed at [facility], [address] [place], [place] [address]    Special Requests   Final    BOTTLES DRAWN AEROBIC AND ANAEROBIC Blood Culture results may not be optimal due to an excessive volume of blood received in culture bottles Performed at [facility], [address] [place], [place] [address]    Culture   Final    NO GROWTH 5 DAYS Performed at [facility], [address] [place], [place] [address]    Report Status 04/10/2019 FINAL  Final  Aerobic/Anaerobic Culture (surgical/deep wound)     Status: None   Collection Time: 04/10/19  2:05 PM   Specimen: Abscess  Result Value Ref Range Status   Specimen Description   Final    ABSCESS DIVERTICULAR Performed at [facility], [address] [place], [place] [address]    Special Requests   Final    Normal Performed at [facility], [address] [place], [place] [address]    Gram Stain   Final    ABUNDANT WBC PRESENT, PREDOMINANTLY PMN ABUNDANT GRAM NEGATIVE RODS ABUNDANT GRAM POSITIVE COCCI FEW GRAM VARIABLE ROD Performed at [facility], [address] [place], [place] [address]    Culture   Final    ABUNDANT MULTIPLE ORGANISMS PRESENT, NONE PREDOMINANT ABUNDANT MIXED ANAEROBIC FLORA PRESENT.  CALL LAB IF FURTHER IID REQUIRED.    Report Status 04/13/2019 FINAL  Final  Aerobic/Anaerobic Culture (surgical/deep wound)      Status: None   Collection Time: 04/10/19  2:06 PM   Specimen: Abscess  Result Value Ref Range Status   Specimen Description   Final    ABSCESS PELVIC Performed at [facility], [address] [place], [place] [address]    Special Requests   Final    Normal Performed at [facility], [address] [place], [place] [address]    Gram Stain   Final    ABUNDANT WBC PRESENT,BOTH PMN AND MONONUCLEAR ABUNDANT GRAM NEGATIVE RODS MODERATE GRAM POSITIVE COCCI MODERATE GRAM VARIABLE ROD Performed at [facility], [address] [place], [place] [address]    Culture   Final    ABUNDANT MULTIPLE ORGANISMS PRESENT, NONE PREDOMINANT ABUNDANT MIXED ANAEROBIC FLORA PRESENT.  CALL LAB IF FURTHER IID REQUIRED.    Report Status 04/13/2019 FINAL  Final  Novel Coronavirus, NAA (  hospital order; send-out to ref lab)     Status: None   Collection Time: 04/18/19  8:45 AM   Specimen: Nasopharyngeal Swab; Respiratory  Result Value Ref Range Status   SARS-CoV-2, NAA NOT DETECTED NOT DETECTED Final    Comment: (NOTE) This test was developed and its performance characteristics determined by [facility]. This test has not been FDA cleared or approved. This test has been authorized by FDA under an Emergency Use Authorization (EUA). This test is only authorized for the duration of time the declaration that circumstances exist justifying the authorization of the emergency use of in vitro diagnostic tests for detection of SARS-CoV-2 virus and/or diagnosis of COVID-19 infection under section 564(b)(1) of the Act, 21 U.S.C. 161WRU-0(A)(5360bbb-3(b)(1), unless the authorization is terminated or revoked sooner. When diagnostic testing is negative, the possibility of a false negative result should be considered in the context of a patient's recent exposures and the presence of clinical signs and symptoms consistent with COVID-19. An individual without symptoms of  COVID-19 and who is not shedding SARS-CoV-2 virus would expect to have a negative (not detected) result in this assay. Performed  At: [facility] [place] [address] [place], [place] [id number] Jolene SchimkeNagendra Sanjai MD NW:[phone]    Coronavirus Source NASOPHARYNGEAL  Final    Comment: Performed at [facility], [address] [place], [place] [address]    Labs: Results for orders placed or performed during the hospital encounter of 04/04/19 (from the past 48 hour(s))  Basic metabolic panel     Status: Abnormal   Collection Time: 04/21/19 10:39 AM  Result Value Ref Range   Sodium 143 135 - 145 mmol/L   Potassium 3.7 3.5 - 5.1 mmol/L   Chloride 105 98 - 111 mmol/L   CO2 30 22 - 32 mmol/L   Glucose, Bld 128 (H) 70 - 99 mg/dL   BUN 26 (H) 6 - 20 mg/dL   Creatinine, Ser 9.620.57 0.44 - 1.00 mg/dL   Calcium 8.5 (L) 8.9 - 10.3 mg/dL   GFR calc non Af Amer >60 >60 mL/min   GFR calc Af Amer >60 >60 mL/min   Anion gap 8 5 - 15    Comment: Performed at [facility], [address] [place], [place] [address]  Magnesium     Status: None   Collection Time: 04/21/19 10:39 AM  Result Value Ref Range   Magnesium 2.1 1.7 - 2.4 mg/dL    Comment: Performed at [facility], [address] [place], [place] [address]  CBC     Status: Abnormal   Collection Time: 04/21/19 10:39 AM  Result Value Ref Range   WBC 8.1 4.0 - 10.5 K/uL   RBC 2.81 (L) 3.87 - 5.11 MIL/uL   Hemoglobin 8.6 (L) 12.0 - 15.0 g/dL   HCT 01.027.0 (L) 27.236.0 - 53.646.0 %   MCV 96.1 80.0 - 100.0 fL   MCH 30.6 26.0 - 34.0 pg   MCHC 31.9 30.0 - 36.0 g/dL   RDW 64.413.8 03.411.5 - 74.215.5 %   Platelets 581 (H) 150 - 400 K/uL   nRBC 0.0 0.0 - 0.2 %    Comment: Performed at [facility], [address] [place], [place] [address]  Glucose, capillary     Status: Abnormal   Collection Time: 04/21/19  8:16 PM  Result Value Ref Range   Glucose-Capillary 102 (H) 70 - 99 mg/dL   Glucose, capillary     Status: None  Collection Time: 04/21/19 11:29 PM  Result Value Ref Range   Glucose-Capillary 88 70 - 99 mg/dL  CBC     Status: Abnormal   Collection Time: 04/22/19  3:19 AM  Result Value Ref Range   WBC 7.8 4.0 - 10.5 K/uL   RBC 2.80 (L) 3.87 - 5.11 MIL/uL   Hemoglobin 8.5 (L) 12.0 - 15.0 g/dL   HCT 16.1 (L) 09.6 - 04.5 %   MCV 96.8 80.0 - 100.0 fL   MCH 30.4 26.0 - 34.0 pg   MCHC 31.4 30.0 - 36.0 g/dL   RDW 40.9 81.1 - 91.4 %   Platelets 615 (H) 150 - 400 K/uL   nRBC 0.0 0.0 - 0.2 %    Comment: Performed at [facility], [address] [place], [place] [address]  Basic metabolic panel     Status: Abnormal   Collection Time: 04/22/19  3:19 AM  Result Value Ref Range   Sodium 144 135 - 145 mmol/L   Potassium 3.6 3.5 - 5.1 mmol/L   Chloride 104 98 - 111 mmol/L   CO2 34 (H) 22 - 32 mmol/L   Glucose, Bld 118 (H) 70 - 99 mg/dL   BUN 36 (H) 6 - 20 mg/dL   Creatinine, Ser 6.21 0.44 - 1.00 mg/dL   Calcium 8.6 (L) 8.9 - 10.3 mg/dL   GFR calc non Af Amer >60 >60 mL/min   GFR calc Af Amer >60 >60 mL/min   Anion gap 6 5 - 15    Comment: Performed at [facility], [address] [place], [place] [address]  Glucose, capillary     Status: None   Collection Time: 04/22/19  3:29 AM  Result Value Ref Range   Glucose-Capillary 98 70 - 99 mg/dL  Glucose, capillary     Status: Abnormal   Collection Time: 04/22/19  7:18 AM  Result Value Ref Range   Glucose-Capillary 102 (H) 70 - 99 mg/dL   Comment 1 Notify RN    Comment 2 Document in Chart     Imaging / Studies: No results found.  Medications / Allergies: per chart  Antibiotics: Anti-infectives (From admission, onward)   Start     Dose/Rate Route Frequency Ordered Stop   04/16/19 0600  cefoTEtan (CEFOTAN) 2 g in sodium chloride 0.9 % 100 mL IVPB     2 g 200 mL/hr over 30 Minutes Intravenous On call to O.R. 04/15/19 1505 04/17/19 0559   04/05/19 0600   piperacillin-tazobactam (ZOSYN) IVPB 3.375 g     3.375 g 12.5 mL/hr over 240 Minutes Intravenous Every 8 hours 04/05/19 0121 04/23/19 0900   04/05/19 0000  piperacillin-tazobactam (ZOSYN) IVPB 3.375 g     3.375 g 100 mL/hr over 30 Minutes Intravenous  Once 04/04/19 2355 04/05/19 0119        Note: Portions of this report may have been transcribed using voice recognition software. Every effort was made to ensure accuracy; however, inadvertent computerized transcription errors may be present.   Any transcriptional errors that result from this process are unintentional.     Ardeth Sportsman, MD, FACS, MASCRS Gastrointestinal and Minimally Invasive Surgery    [address], [address] [place], [place] [address] [phone] Main / Paging [phone] Fax

## 2019-04-22 NOTE — Progress Notes (Signed)
Ambulated 43ft in hall with RW and 1 person SBA. Tolerated fairly well; return to chair. Goal is to sit up in chair for several hours, then ambulate again prior to return to bed. PCA encouraged in order to control pain and maintain OOB/ambulation. Will continue to monitor.

## 2019-04-22 NOTE — Plan of Care (Signed)
Patient lying in bed this morning; agreeable to getting oob and ambulating throughout the day. Pain controlled with PCA; encouraged continued use to control pain and allow ambulation and oob. Will continue to monitor.

## 2019-04-23 LAB — CBC
HCT: 25.3 % — ABNORMAL LOW (ref 36.0–46.0)
Hemoglobin: 7.9 g/dL — ABNORMAL LOW (ref 12.0–15.0)
MCH: 30.7 pg (ref 26.0–34.0)
MCHC: 31.2 g/dL (ref 30.0–36.0)
MCV: 98.4 fL (ref 80.0–100.0)
Platelets: 538 10*3/uL — ABNORMAL HIGH (ref 150–400)
RBC: 2.57 MIL/uL — ABNORMAL LOW (ref 3.87–5.11)
RDW: 13.7 % (ref 11.5–15.5)
WBC: 5.7 10*3/uL (ref 4.0–10.5)
nRBC: 0 % (ref 0.0–0.2)

## 2019-04-23 LAB — DIFFERENTIAL
Abs Immature Granulocytes: 0.02 10*3/uL (ref 0.00–0.07)
Basophils Absolute: 0 10*3/uL (ref 0.0–0.1)
Basophils Relative: 1 %
Eosinophils Absolute: 0.2 10*3/uL (ref 0.0–0.5)
Eosinophils Relative: 4 %
Immature Granulocytes: 0 %
Lymphocytes Relative: 18 %
Lymphs Abs: 1 10*3/uL (ref 0.7–4.0)
Monocytes Absolute: 0.5 10*3/uL (ref 0.1–1.0)
Monocytes Relative: 8 %
Neutro Abs: 4 10*3/uL (ref 1.7–7.7)
Neutrophils Relative %: 69 %

## 2019-04-23 LAB — COMPREHENSIVE METABOLIC PANEL
ALT: 26 U/L (ref 0–44)
AST: 20 U/L (ref 15–41)
Albumin: 2.4 g/dL — ABNORMAL LOW (ref 3.5–5.0)
Alkaline Phosphatase: 105 U/L (ref 38–126)
Anion gap: 9 (ref 5–15)
BUN: 33 mg/dL — ABNORMAL HIGH (ref 6–20)
CO2: 29 mmol/L (ref 22–32)
Calcium: 8.6 mg/dL — ABNORMAL LOW (ref 8.9–10.3)
Chloride: 108 mmol/L (ref 98–111)
Creatinine, Ser: 0.64 mg/dL (ref 0.44–1.00)
GFR calc Af Amer: >60 mL/min (ref >60)
GFR calc non Af Amer: >60 mL/min (ref >60)
Glucose, Bld: 115 mg/dL — ABNORMAL HIGH (ref 70–99)
Potassium: 3.5 mmol/L (ref 3.5–5.1)
Sodium: 146 mmol/L — ABNORMAL HIGH (ref 135–145)
Total Bilirubin: 1 mg/dL (ref 0.3–1.2)
Total Protein: 6.1 g/dL — ABNORMAL LOW (ref 6.5–8.1)

## 2019-04-23 LAB — MAGNESIUM: Magnesium: 2 mg/dL (ref 1.7–2.4)

## 2019-04-23 LAB — PHOSPHORUS: Phosphorus: 3.9 mg/dL (ref 2.5–4.6)

## 2019-04-23 LAB — PREALBUMIN: Prealbumin: 15.2 mg/dL — ABNORMAL LOW (ref 18–38)

## 2019-04-23 LAB — TRIGLYCERIDES: Triglycerides: 97 mg/dL (ref <150)

## 2019-04-23 LAB — GLUCOSE, CAPILLARY: Glucose-Capillary: 89 mg/dL (ref 70–99)

## 2019-04-23 MED ORDER — PIPERACILLIN-TAZOBACTAM 3.375 G IVPB
3.3750 g | Freq: Three times a day (TID) | INTRAVENOUS | Status: AC
  Administered 2019-04-23: 3.375 g via INTRAVENOUS
  Filled 2019-04-23: qty 50

## 2019-04-23 MED ORDER — POTASSIUM CHLORIDE 10 MEQ/50ML IV SOLN
10.0000 meq | INTRAVENOUS | Status: AC
  Administered 2019-04-23 (×4): 10 meq via INTRAVENOUS
  Filled 2019-04-23 (×4): qty 50

## 2019-04-23 MED ORDER — TRAVASOL 10 % IV SOLN
INTRAVENOUS | Status: AC
  Administered 2019-04-23: 19:00:00 via INTRAVENOUS
  Filled 2019-04-23: qty 990

## 2019-04-23 NOTE — Consult Note (Addendum)
[place] Nurse ostomy consult note Stoma type/location: LMQ colostomy. Temporary. Stomal assessment/size: 1 and 1/2 inch, round, edematous, red, budded, os at center Peristomal assessment: intact.  Medial edge is at umbilical crease. Treatment options for stomal/peristomal skin: skin barrier ring Output: scant amt tan liquid Ostomy pouching: 2pc. 2 and 1/4 inch pouching system with skin barrier ring Education provided: Explained role of ostomy nurse and creation of stoma  Explained stoma characteristics (budded, flush, color, texture, care) Demonstrated pouch change (cutting new skin barrier, measuring stoma, cleaning peristomal skin and stoma, use of barrier ring) Discussed risk of peristomal hernia 5 sets of supplies left at the bedside for staff nurse use. Pt was able to open and close velcro to empty and assisted with pouch application process.   Answered patient questions, reviewed pouching procedures and ordering supplies after discharge.   Enrolled patient in [place] program: Yes WOC will perform another teaching session tomorrow at 1000 as requested with patient's daughter present. Julien Girt MSN, RN, [place], [place], [place]

## 2019-04-23 NOTE — Progress Notes (Signed)
5 Days Post-Op  Subjective: CC: Abdominal pain Patient reports her pain is much better since having the IV Toradol and Robaxin added yesterday. Still using PCA. Reports she was able to get up and walk from her door to the end of the hall twice this morning. She feels she would be able to walk more if she was able to use the scooter that she had at home in the halls (do to her right leg cast). She notes no N/V. Did okay while NGT was clamped and was walking in the hall. Unsure of flatus through ostomy, thinks maybe "a little" yesterday. Had colostomy bag changed this morning. No air or stool in bag prior to change or currently. Family to come in tomorrow at 10am for teaching session. She has not been using her IS.  Was instructed on using this, and reports she will start to do so today. Not wearing left SCD at night but will start to do so.   Objective: Vital signs in last 24 hours: Temp:  [98.1 F (36.7 C)-98.6 F (37 C)] 98.1 F (36.7 C) (06/21 2107) Pulse Rate:  [108-121] 108 (06/21 2325) Resp:  [10-20] 14 (06/22 0816) BP: (109-123)/(79-85) 111/85 (06/21 2107) SpO2:  [92 %-100 %] 97 % (06/22 0811) FiO2 (%):  [97 %] 97 % (06/22 0811) Last BM Date: (ostomy placed 6/17; scant liquid output)  Intake/Output from previous day: 06/21 0701 - 06/22 0700 In: 2253.6 [P.O.:60; I.V.:1658.8; IV Piggyback:534.9] Out: 1650 [Urine:1500; Emesis/NG output:150] Intake/Output this shift: Total I/O In: 356 [P.O.:60; I.V.:263.9; IV Piggyback:32] Out: 900 [Urine:900]  PE: Gen: Awake and alert, NAD Heart: Regular Lungs: CTA b/l, normal effort and rate. Pulling 1500 on IS Abd: Soft, ND, appropriately tender around midline incision. Colostomy bag in place, decompressed without air or stool in bag. This was just changed this morning. Hypoactive bowel sounds. NG tube in place on LIWS. 150cc recorded overnight, bilious. JP drain with SS fluid.  Msk: Right leg cast. Left lower leg without edema or calf  tenderness.   Lab Results:  Recent Labs    04/22/19 0319 04/23/19 0339  WBC 7.8 5.7  HGB 8.5* 7.9*  HCT 27.1* 25.3*  PLT 615* 538*   BMET Recent Labs    04/22/19 0319 04/23/19 0339  NA 144 146*  K 3.6 3.5  CL 104 108  CO2 34* 29  GLUCOSE 118* 115*  BUN 36* 33*  CREATININE 0.82 0.64  CALCIUM 8.6* 8.6*   PT/INR No results for input(s): LABPROT, INR in the last 72 hours. CMP     Component Value Date/Time   NA 146 (H) 04/23/2019 0339   K 3.5 04/23/2019 0339   CL 108 04/23/2019 0339   CO2 29 04/23/2019 0339   GLUCOSE 115 (H) 04/23/2019 0339   BUN 33 (H) 04/23/2019 0339   CREATININE 0.64 04/23/2019 0339   CALCIUM 8.6 (L) 04/23/2019 0339   PROT 6.1 (L) 04/23/2019 0339   ALBUMIN 2.4 (L) 04/23/2019 0339   AST 20 04/23/2019 0339   ALT 26 04/23/2019 0339   ALKPHOS 105 04/23/2019 0339   BILITOT 1.0 04/23/2019 0339   GFRNONAA >60 04/23/2019 0339   GFRAA >60 04/23/2019 0339   Lipase     Component Value Date/Time   LIPASE 25 09/12/2015 1615       Studies/Results: No results found.  Anti-infectives: Anti-infectives (From admission, onward)   Start     Dose/Rate Route Frequency Ordered Stop   04/23/19 0200  piperacillin-tazobactam (ZOSYN) IVPB 3.375  g     3.375 g 12.5 mL/hr over 240 Minutes Intravenous Every 8 hours 04/23/19 0144 04/23/19 0832   04/16/19 0600  cefoTEtan (CEFOTAN) 2 g in sodium chloride 0.9 % 100 mL IVPB     2 g 200 mL/hr over 30 Minutes Intravenous On call to O.R. 04/15/19 1505 04/17/19 0559   04/05/19 0600  piperacillin-tazobactam (ZOSYN) IVPB 3.375 g  Status:  Discontinued     3.375 g 12.5 mL/hr over 240 Minutes Intravenous Every 8 hours 04/05/19 0121 04/23/19 0144   04/05/19 0000  piperacillin-tazobactam (ZOSYN) IVPB 3.375 g     3.375 g 100 mL/hr over 30 Minutes Intravenous  Once 04/04/19 2355 04/05/19 0119       Assessment/Plan  GERD  Osteoarthritis - takes meloxicam and tramadol at baseline  Recent R flatfoot reconstruction and  lapidus bunion correction surgery by Dr. Hewitt5/26/2020 - per note on 6/15 "patient is fine to stay in her post-op splint until she is D/C'd from the hospital" & "stitches can stay in without causing any type of difficulty in regards to her wound healing"  Protein Calorie Malnutrition - prealbumin <5 (6/10) -> 8.3 (6/15) -> 12.3 (6/22), on TPN  Anemia - Hgb 7.9 (monitor)  PERFORATION OF SMALL BOWEL BY CT GUIDED DRAIN PERFORATED DIVERTICULITIS -s/p Exploratory laparotomy, Hartman's procedure, small bowel resection - Dr. Maisie Fushomas - 04/18/2019 - POD #5  - Cystoscopy with Stent placement - Dr. Sherron MondayMacDiarmid - 6/17 - Foley out, voiding on own without difficulty. Ureteral stents per Urology.  - Post-operative ileus. Continue NGT and await bowel function - Pain much improved. Continue Dilaudid PCA, PRN Toradol and Robaxin. Can add IV Tylenol if pain increases.  - Continue TPN for nutrition. Pre-albumin up to 12.3 (6/22) - Continue surgical drain.  - Mobilizing more, continue to encourage. PT. Will discuss if able to bring scooter from home.  - Encourage IS use.  - Will d/c abx after today's doses are complete (5 days of post-operative abx). WBC 5.7 Afebrile.  - Surgical pathology benign. Diverticular disease with transmural defect. One benign lymph node, serosal adhesions, margins histologically viable, no evidence of malignancy   FEN - NPO, TPN, K 3.5, Mg 2.0 VTE - SCDs, Lovenox, Mobilize as able.  ID - Zosyn for 5 days post-op (6/17 - 6/22) Foley - Removed Follow-Up: Dr. Maisie Fushomas. Dr. Sherron MondayMacDiarmid (Urology) POC - Patient's daughters were on facetime while I was in the room    LOS: 18 days    Jacinto HalimMichael M Wellington Winegarden , [facility] 04/23/2019, 10:16 AM Pager: [phone]

## 2019-04-23 NOTE — Progress Notes (Signed)
PT Cancellation Note  Patient Details Name: PRINCELLA JASKIEWICZ MRN: [id number] DOB: 20-Jan-1966   Cancelled Treatment:    Reason Eval/Treat Not Completed: Other (comment) - Pt mobilizing with RN, has already ambulated hallway distance x3 today. PT to check back.   Julien Girt, PT Acute Rehabilitation Services Pager [phone]  Office [phone]   Roxine Caddy D Elonda Husky 04/23/2019, 11:31 AM

## 2019-04-23 NOTE — Progress Notes (Signed)
PROGRESS NOTE    Molly Beck  VPX:[id number] DOB: 20-Dec-1965 DOA: 04/04/2019 PCP: Clementeen GrahamScifres, [name], Beck  Brief Narrative:  Molly Beck a 53 y.o.femalewith medical history significant ofGERD, elective surgery of her right foot a week ago presenting to the hospital for evaluation of abdominal pain.Patient states she had areconstructive right foot surgery done a week ago. States since then she has been constipated due to taking pain medications. Her last bowel movement was prior to the surgery. For the past few days she is having left lower quadrant abdominal pain which became worse for the past 24 hours. The pain is sharp and excruciating. States today she called her surgeon's office and was advised to take magnesium citrate for constipation. She vomited soon after taking magnesium citrate. Denies any fevers. She continues to complain of abdominal pain despite receiving a dose ofmorphine and multiple doses of fentanyl in the ED. CT abdomen pelvis showing acute sigmoid colitis with small foci of adjacent gas that may be extra luminal and may indicate microperforation. No abscess or drainable fluid collection. She was admitted for further treatment and care.  General surgery was consulted.  **Interim History Patient with improved abdominal discomfort overnight following insertion of NG tube with significant output.  Repeat imaging studies suggest continued small bowel obstruction.  Intra-abdominal drains continue to drain well.    Patient is continued on TPN because of lack of progression and because of concern for bilious drainage she underwent surgical intervention on 04/18/2019 and urology was consulted for bilateral stent placement and cystoscopy.  General surgery did exploratory laparotomy with small bowel resection and Hartman's procedure along with urology doing a cystoscopy with bilateral stent placement.  Today patient was in a lot of pain so surgery was admitted  Dilaudid PCA.  Patient was complaining of significant pressure in her bladder felt that she had a urinary tract infection however it turned out that her catheter was blocked and she is re-cath and 1 L was removed.    Foley catheter was removed and pain medications are being adjusted and increased.  Patient undergoing NG tube clamping and has been ambulating the halls.  Better controlled with the PCA Dilaudid.  Surgery recommending IV antibiotics for 5 days total Post-op after Surgical Intervention (Today).  Will likely need to have her ureteral stenting removed eventually.  Continue TNA nutrition for now until bowels are improved continue to mobilize.  Her surgery can add IV Tylenol pain increased  Assessment & Plan:   Principal Problem:   Perforated sigmoid colon  Active Problems:   Colitis   Sepsis (HCC)   Hypokalemia   H/O foot surgery  Sepsis secondary to Acute Sigmoid Colitis with Microperforation complicated by 2 Absecesses status post drain placement and now exploratory laparotomy with Hartman's procedure a small bowel resection along with cystoscopy and bilateral stent placement SBO Sepsis, present on admission and physiology is improving -Patient presenting to ED with progressive abdominal pain.  CT abdomen/pelvis notable for acute sigmoid colitis with adjacent gas likely secondary to microperforation.   -Patient was initially started on IV Zosyn.  During her initial hospitalization, her abdominal pain continued not to improve with resultant fevers and elevated white count.  Repeat CT abdomen/pelvis reveals marked worsening in the pelvis with 2 abscesses.  Interventional radiology was consulted and placed two drains on 04/10/2019.  -General surgery/IR following, appreciate assistance -Blood culture 04/05/2019 no growth x5 days -Wound culture 04/10/2019: Abundant mixed organisms, none predominant -IV Zosyn to be discontinued today  -NGT  placement 04/16/2019 per general surgery and they are  trying to clamp and see if they can be removed but there are recommending continuing NG tube and await bowel function return -TPN per pharmacy -Continue to monitor drain output and BMs -Because of minimal improvement and now with green bile color in anterior abdominal drain, General Surgery recommending exploratory laparotomy and patient underwent a Hartman's procedure and small bowel resection; she is postoperative day 4 today -Urology Consulted by General Surgery for Bilateral Renal Stents and Cystoscopy; Foley catheter was replaced and now has been removed and will need her ureteral stenting removed in the outpatient setting -Last SARS-CoV-2 testing was negative on 04/05/2019 however this was repeated this morning -Currently getting TPN at 75 mL's per hour -KUB on 04/18/2019 showed "Improving small bowel obstruction. Contrast in the colon now reaches the rectum. Stable percutaneous drains in the pelvis."  -Continue supportive care and antiemetics -Pain control per general surgery as they adding a PCA with Dilaudid and continue Toradol for now; PCA medications have been increased for better pain control  Rash -Maculopapular rash bilateral distribution on her back.  -Could be due to heat/sweat rash or contact from bedsheets.   -She has tolerated IV Zosyn for the past week without any signs of allergic reaction.  This does not appear to be an reaction to Zosyn at this time.   -Continue IV Benadryl and hydrocortisone cream prn.  -Continue hygiene and change of bed sheets daily  Recent right foot surgery and Tendon Repair -Currently in a cast, plans to follow-up with her orthopedic surgeon as an outpatient -PT following -Continue nonweightbearing right lower extremity  Hypokalemia -K+ was 3.5 this AM -Continue to Monitor and Replete as Necessary but likely will be replete in the TPN -Repeat CMP in AM  Normocytic Anmeia  -Patient's Hgb/Hct stable and is now 9.9/30.6 -> 8.6/27.0 -> 8.5/27.1  -> 7.9/25.3 -Anemia panel done and showed an iron level of 16, U IBC 189, TIBC of 205, saturation ratios of 8%, ferritin level 171, folate level 9.5, and vitamin B12 848 -Continue to Monitor for S/Sx of Bleeding; Currently no overt Bleeding  -Repeat CBC in AM   Hyperglycemia -Likely reactive and in the setting of TPN; CBG's ranging from 100-118 -HbA1c was 4.9 on 04/14/2019 -Glucose this a.m. on CMP was 128 -CBG's ranging from 100-118  GERD -Continue with PPI IV with Pantoprazole 40 mg q24h until  able to take p.o.  Anxiety -Continue with Lorazepam 0.5 mg IV every 6 as needed for Anxiety  Obesity -Estimated body mass index is 30.84 kg/m as calculated from the following:   Height as of this encounter: 5\' 4"  (1.626 m).   Weight as of this encounter: 81.5 kg. -Weight Loss and Dietary Counseling given   Leukocytosis -In the setting of surgery and reactive -WBC went from 6.7 -> 11.6 -> 11.3 -> 8.1 -> 7.8 -> 5.7 -Continue to monitor for signs and symptoms of infection -Repeat CBC in a.m.  Abnormal ALT -Patient's ALT was 45 and ? If elevated in the setting of surgery; now trended down and is 26 -If consistently elevated or worsening will obtain right upper quadrant ultrasound as well as acute hepatitis panel -Continue monitor and trend repeat CMP in a.m.  Thrombocytosis   -Likely reactive in the setting of antibiotic use -Platelet count is now 538,000 -Continue monitor and trend -Repeat CBC in AM   Hypernatremia -Patient sodium level this morning was 146 -In the setting of TPN administration -Continue monitor and  trend and repeat sodium level in a.m.  DVT prophylaxis: Enoxaparin 40 mg sq q24h Code Status: FULL CODE  Family Communication: No family present at bedside  Disposition Plan: Remain Inpatient as patient is going for Surgical Intervention    Consultants:   Interventional Radiology  General Surgery  Orthopedic Surgery   Urology   Procedures  IR drain  placement x 2 04/10/2019  PICC RUE 04/10/2019  Surgical Intervention and Cystoscopy by Dr. Maisie Fushomas and MacDiarmid   Antimicrobials:  Anti-infectives (From admission, onward)   Start     Dose/Rate Route Frequency Ordered Stop   04/23/19 0200  piperacillin-tazobactam (ZOSYN) IVPB 3.375 g     3.375 g 12.5 mL/hr over 240 Minutes Intravenous Every 8 hours 04/23/19 0144 04/23/19 0832   04/16/19 0600  cefoTEtan (CEFOTAN) 2 g in sodium chloride 0.9 % 100 mL IVPB     2 g 200 mL/hr over 30 Minutes Intravenous On call to O.R. 04/15/19 1505 04/17/19 0559   04/05/19 0600  piperacillin-tazobactam (ZOSYN) IVPB 3.375 g  Status:  Discontinued     3.375 g 12.5 mL/hr over 240 Minutes Intravenous Every 8 hours 04/05/19 0121 04/23/19 0144   04/05/19 0000  piperacillin-tazobactam (ZOSYN) IVPB 3.375 g     3.375 g 100 mL/hr over 30 Minutes Intravenous  Once 04/04/19 2355 04/05/19 0119     Subjective: Seen and examined at bedside and states that she did not sleep very well last night but states that she is feeling better has ambulated the halls 3 times already.  NG tube has been taken off to suction but is still remains in place.  Patient felt as if she had a little bit of gas in her bag yesterday but none today.  No nausea or vomiting.  Still feels a little tired but overall feeling better and is not as frustrated.   Objective: Vitals:   04/23/19 0816 04/23/19 1113 04/23/19 1325 04/23/19 1348  BP:   (!) 120/93   Pulse:   (!) 119 100  Resp: 14 17 17    Temp:   (!) 97.5 F (36.4 C)   TempSrc:   Oral   SpO2:  96% 100%   Weight:      Height:        Intake/Output Summary (Last 24 hours) at 04/23/2019 1402 Last data filed at 04/23/2019 1328 Gross per 24 hour  Intake 2298.98 ml  Output 2650 ml  Net -351.02 ml   Filed Weights   04/10/19 1059 04/16/19 1700 04/17/19 1050  Weight: 78.9 kg 79.7 kg 81.5 kg   Examination: Physical Exam:  Constitutional: Well-nourished, well-developed slightly obese Caucasian  female currently who is improving and sitting on chair bedside Respiratory: Diminished auscultation bilaterally no appreciable wheezing, rales, rhonchi.  Patient is not tachypneic or using any accessory muscles to breathe Cardiovascular: Regular rate and rhythm.  No appreciable murmurs, rubs, gallops Abdomen: Soft, tender to palpation.  Still has an ostomy with midline honeycomb dressing for her surgical scar as well as a drain in place GU: Deferred Neurologic: Cranial nerves II through XII grossly intact no appreciable focal deficits Psychiatric: Pleasant mood and affect today.  Intact judgment.  Not irritated or frustrated  Data Reviewed: I have personally reviewed following labs and imaging studies  CBC: Recent Labs  Lab 04/19/19 0412 04/20/19 0408 04/21/19 1039 04/22/19 0319 04/23/19 0339  WBC 11.6* 11.3* 8.1 7.8 5.7  NEUTROABS 9.1* 8.5*  --   --  4.0  HGB 9.9* 8.6* 8.6* 8.5* 7.9*  HCT 30.6* 27.0* 27.0* 27.1* 25.3*  MCV 94.7 95.1 96.1 96.8 98.4  PLT 584* 532* 581* 615* 538*   Basic Metabolic Panel: Recent Labs  Lab 04/18/19 0324 04/19/19 0412 04/19/19 1848 04/20/19 0408 04/21/19 1039 04/22/19 0319 04/23/19 0339  NA 140 134* 137 137 143 144 146*  K 3.9 4.4 3.9 4.0 3.7 3.6 3.5  CL 108 101 102 101 105 104 108  CO2 34* 29  GLUCOSE 120* 128* 113* 116* 128* 118* 115*  BUN 13 22* 24* 27* 26* 36* 33*  CREATININE 0.43* 0.61 0.53 0.65 0.57 0.82 0.64  CALCIUM 8.2* 8.3* 8.1* 8.2* 8.5* 8.6* 8.6*  MG 2.2 1.9  --  2.1 2.1  --  2.0  PHOS  --  3.3  --  4.5  --   --  3.9   GFR: Estimated Creatinine Clearance: 84.9 mL/min (by C-G formula based on SCr of 0.64 mg/dL). Liver Function Tests: Recent Labs  Lab 04/19/19 0412 04/19/19 1848 04/23/19 0339  AST ALT 45* 36 26  ALKPHOS 84 85 105  BILITOT 0.8 0.6 1.0  PROT 6.2* 6.0* 6.1*  ALBUMIN 2.5* 2.5* 2.4*   No results for input(s): LIPASE, AMYLASE in the last 168 hours. No results for input(s): AMMONIA  in the last 168 hours. Coagulation Profile: No results for input(s): INR, PROTIME in the last 168 hours. Cardiac Enzymes: No results for input(s): CKTOTAL, CKMB, CKMBINDEX, TROPONINI in the last 168 hours. BNP (last 3 results) No results for input(s): PROBNP in the last 8760 hours. HbA1C: No results for input(s): HGBA1C in the last 72 hours. CBG: Recent Labs  Lab 04/22/19 0718 04/22/19 1116 04/22/19 1550 04/22/19 2127 04/23/19 0111  GLUCAP 102* 94 88 108* 89   Lipid Profile: Recent Labs    04/23/19 0339  TRIG 97   Thyroid Function Tests: No results for input(s): TSH, T4TOTAL, FREET4, T3FREE, THYROIDAB in the last 72 hours. Anemia Panel: No results for input(s): VITAMINB12, FOLATE, FERRITIN, TIBC, IRON, RETICCTPCT in the last 72 hours. Sepsis Labs: No results for input(s): PROCALCITON, LATICACIDVEN in the last 168 hours.  Recent Results (from the past 240 hour(s))  Novel Coronavirus, NAA (hospital order; send-out to ref lab)     Status: None   Collection Time: 04/18/19  8:45 AM   Specimen: Nasopharyngeal Swab; Respiratory  Result Value Ref Range Status   SARS-CoV-2, NAA NOT DETECTED NOT DETECTED Final    Comment: (NOTE) This test was developed and its performance characteristics determined by [employer]. This test has not been FDA cleared or approved. This test has been authorized by FDA under an Emergency Use Authorization (EUA). This test is only authorized for the duration of time the declaration that circumstances exist justifying the authorization of the emergency use of in vitro diagnostic tests for detection of SARS-CoV-2 virus and/or diagnosis of COVID-19 infection under section 564(b)(1) of the Act, 21 U.S.C. 161WRU-0(A)(5), unless the authorization is terminated or revoked sooner. When diagnostic testing is negative, the possibility of a false negative result should be considered in the context of a patient's recent exposures and the presence of  clinical signs and symptoms consistent with COVID-19. An individual without symptoms of COVID-19 and who is not shedding SARS-CoV-2 virus would expect to have a negative (not detected) result in this assay. Performed  At: [facility] [address] [place], [place] [id number] Jolene Schimke MD NW:[id number]    Coronavirus Source NASOPHARYNGEAL  Final  Comment: Performed at [facility], [address] [place], [place] [id number]    Radiology Studies: No results found. Scheduled Meds:  enoxaparin (LOVENOX) injection  40 mg Subcutaneous Q24H   fluticasone  1 spray Each Nare Daily   HYDROmorphone   Intravenous Q4H   ketorolac  30 mg Intravenous Q8H   lip balm  1 application Topical BID   pantoprazole (PROTONIX) IV  40 mg Intravenous QHS   sodium chloride flush  10-40 mL Intracatheter Q12H   Continuous Infusions:  methocarbamol (ROBAXIN) IV 1,000 mg (04/23/19 0920)   potassium chloride 10 mEq (04/23/19 1301)   TPN ADULT (ION) 75 mL/hr at 04/22/19 1838   TPN ADULT (ION)      LOS: 18 days   Merlene Laughtermair Latif Juanelle Trueheart, DO [facility] PAGER is on AMION  If 7PM-7AM, please contact night-coverage [email or web address] Password [email or web address] 04/23/2019, 2:02 PM

## 2019-04-23 NOTE — Progress Notes (Addendum)
PHARMACY - ADULT TOTAL PARENTERAL NUTRITION CONSULT NOTE   Pharmacy Consult for TPN Indication: intolerance to enteral feedings, sigmoid diverticulitis, SBO  Patient Measurements: Height: 5\' 4"  (162.6 cm) Weight: 179 lb 10.8 oz (81.5 kg) IBW/kg (Calculated) : 54.7 TPN AdjBW (KG): 61 Body mass index is 30.84 kg/m.  Current Nutrition: NPO, TPN  IVF: none  Central access:  PICC placed 6/9 TPN start date: 6/9  ASSESSMENT                                                                                                          HPI: 53 yo female with sigmoid diverticulitis with abscesses worsened since admission.  IR placed drain 6/9. Patient has been intolerant to enteral feedings for about a week so plan to place PICC line and start TPN per CCS.  Significant events:  6/17: ex lap with small bowel resection & stent placement  Today, 04/23/19  Glucose - at goal < 150, CBGs & SSI d/c'd 6/16  Electrolytes - Na now elevated, K low, otherwise WNL  Renal - SCr, bicarb, UOP WNL, BUN:SCr elevated but now trending down  LFTs - WNL  TGs - stable WNL  Prealbumin - low but improved, most recent 15.2  NUTRITIONAL GOALS                                                                                             RD recs: Kcal/day: 2000-2240, protein/day: 95-105g  Custom TPN to provide 99g protein per day and 1915 kcal/day at goal rate of 75 ml/hr (96% of estimated needs per RD)  PLAN                                                                                                                         KCl 10 mEq IV x 4  At 1800 today:  Continue custom TPN at goal rate of 75 ml/hr  Reduce Na, increase K in TPN, other lytes unchanged; Cl:Ac = 1:1  TPN will contain standard electrolytes except: Na at 100, K at 90, Mag at 8 and change LH:TDSKAJG ratio to 1:2.   TPN to contain standard multivitamins daily and trace elements on  MWF due to national shortage  IVF per MD (currently  none)  TPN lab panels on Mondays & Thursdays  Bmet tomorrow  Bernadene Personrew Jakory Matsuo, PharmD, BCPS [phone] 04/23/2019, 10:35 AM

## 2019-04-23 NOTE — Progress Notes (Signed)
Nutrition Follow-up  INTERVENTION:   -TPN per Pharmacy -Recommend new weight (last recorded 6/16)  NUTRITION DIAGNOSIS:   Inadequate oral intake related to inability to eat as evidenced by NPO status.  Ongoing.  GOAL:   Patient will meet greater than or equal to 90% of their needs  Meeting with TPN.  MONITOR:   Diet advancement, Labs, Weight trends, Other (Comment)(TPN regimen)  ASSESSMENT:   53 y.o. female with medical history significant of GERD. She had elective reconstructive surgery of R foot a week ago. She presented to the ED for evaluation of abdominal pain. Patient reported constipation d/t pain medications since surgery. She was experiencing LLQ abdominal pain for several days which became worse in the 24 hours PTA; sharp, excruciating. CT abdomen/pelvis showed acute sigmoid colitis with small foci of adjacent gas that may be extra-luminal and may indicate micro-perforation. No abscess or drainable fluid collection at the time of admission. General surgery was consulted.  6/17: s/p  Exploratory laparotomy, Hartman's procedure, small bowel resection  **RD working remotely**  Patient continues to be NPO. NGT still in place, output x 24 hours: 450 ml. Continues to receive TPN at 75 ml/hr, providing 1915 kcal and 99g protein.   No new weights have been recorded since 6/16.  Medications reviewed.  Labs reviewed: CBGs: 89-108 Elevated Na  Diet Order:   Diet Order            Diet NPO time specified Except for: Ice Chips  Diet effective now              EDUCATION NEEDS:   No education needs have been identified at this time  Skin:  Skin Assessment: Reviewed RN Assessment  Last BM:  6/4  Height:   Ht Readings from Last 1 Encounters:  04/05/19 5\' 4"  (1.626 m)    Weight:   Wt Readings from Last 1 Encounters:  04/17/19 81.5 kg    Ideal Body Weight:  54.5 kg  BMI:  Body mass index is 30.84 kg/m.  Estimated Nutritional Needs:   Kcal:  2000-2240  kcal  Protein:  95-105 grams  Fluid:  >/= 2 L/day  Clayton Bibles, MS, RD, LDN [place] Dietitian Pager: [phone] After Hours Pager: [phone]

## 2019-04-24 LAB — CBC WITH DIFFERENTIAL/PLATELET
Abs Immature Granulocytes: 0.02 10*3/uL (ref 0.00–0.07)
Basophils Absolute: 0.1 10*3/uL (ref 0.0–0.1)
Basophils Relative: 1 %
Eosinophils Absolute: 0.2 10*3/uL (ref 0.0–0.5)
Eosinophils Relative: 3 %
HCT: 25.2 % — ABNORMAL LOW (ref 36.0–46.0)
Hemoglobin: 7.9 g/dL — ABNORMAL LOW (ref 12.0–15.0)
Immature Granulocytes: 0 %
Lymphocytes Relative: 15 %
Lymphs Abs: 0.9 10*3/uL (ref 0.7–4.0)
MCH: 30.7 pg (ref 26.0–34.0)
MCHC: 31.3 g/dL (ref 30.0–36.0)
MCV: 98.1 fL (ref 80.0–100.0)
Monocytes Absolute: 0.4 10*3/uL (ref 0.1–1.0)
Monocytes Relative: 7 %
Neutro Abs: 4.2 10*3/uL (ref 1.7–7.7)
Neutrophils Relative %: 74 %
Platelets: 513 10*3/uL — ABNORMAL HIGH (ref 150–400)
RBC: 2.57 MIL/uL — ABNORMAL LOW (ref 3.87–5.11)
RDW: 13.5 % (ref 11.5–15.5)
WBC: 5.8 10*3/uL (ref 4.0–10.5)
nRBC: 0 % (ref 0.0–0.2)

## 2019-04-24 LAB — COMPREHENSIVE METABOLIC PANEL
ALT: 30 U/L (ref 0–44)
AST: 22 U/L (ref 15–41)
Albumin: 2.5 g/dL — ABNORMAL LOW (ref 3.5–5.0)
Alkaline Phosphatase: 117 U/L (ref 38–126)
Anion gap: 8 (ref 5–15)
BUN: 28 mg/dL — ABNORMAL HIGH (ref 6–20)
CO2: 24 mmol/L (ref 22–32)
Calcium: 8.7 mg/dL — ABNORMAL LOW (ref 8.9–10.3)
Chloride: 113 mmol/L — ABNORMAL HIGH (ref 98–111)
Creatinine, Ser: 0.53 mg/dL (ref 0.44–1.00)
GFR calc Af Amer: >60 mL/min (ref >60)
GFR calc non Af Amer: >60 mL/min (ref >60)
Glucose, Bld: 118 mg/dL — ABNORMAL HIGH (ref 70–99)
Potassium: 3.9 mmol/L (ref 3.5–5.1)
Sodium: 145 mmol/L (ref 135–145)
Total Bilirubin: 0.6 mg/dL (ref 0.3–1.2)
Total Protein: 6.3 g/dL — ABNORMAL LOW (ref 6.5–8.1)

## 2019-04-24 LAB — MAGNESIUM: Magnesium: 2 mg/dL (ref 1.7–2.4)

## 2019-04-24 LAB — PHOSPHORUS: Phosphorus: 3.4 mg/dL (ref 2.5–4.6)

## 2019-04-24 MED ORDER — TRAVASOL 10 % IV SOLN
INTRAVENOUS | Status: AC
  Administered 2019-04-24: 18:00:00 via INTRAVENOUS
  Filled 2019-04-24: qty 990

## 2019-04-24 MED ORDER — KETOROLAC TROMETHAMINE 15 MG/ML IJ SOLN
30.0000 mg | Freq: Three times a day (TID) | INTRAMUSCULAR | Status: AC
  Administered 2019-04-24 – 2019-04-26 (×6): 30 mg via INTRAVENOUS
  Filled 2019-04-24 (×6): qty 2

## 2019-04-24 NOTE — Progress Notes (Signed)
PHARMACY - ADULT TOTAL PARENTERAL NUTRITION CONSULT NOTE   Pharmacy Consult for TPN Indication: intolerance to enteral feedings, sigmoid diverticulitis, SBO  Patient Measurements: Height: 5\' 4"  (162.6 cm) Weight: 179 lb 10.8 oz (81.5 kg) IBW/kg (Calculated) : 54.7 TPN AdjBW (KG): 61 Body mass index is 30.84 kg/m.  Current Nutrition: NPO, TPN  IVF: none  Central access:  PICC placed 6/9 TPN start date: 6/9  ASSESSMENT                                                                                                          HPI: 53 yo female with sigmoid diverticulitis with abscesses worsened since admission.  IR placed drain 6/9. Patient has been intolerant to enteral feedings for about a week so plan to place PICC line and start TPN per [facility].  Significant events:  6/17: ex lap with small bowel resection & stent placement  Today, 04/24/19  Glucose - at goal < 150, CBGs & SSI d/c'd 6/16; SBGs WNL  Electrolytes - Na, K improved after adjusting TPN yesterday; Cl now elevated; phos trending down, Mg, Ca others stable WNL  Renal - SCr, bicarb, UOP excellent; BUN:SCr elevated but now trending down  I/O - NG OP had improved but now increased again  LFTs - WNL  TGs - stable WNL  Prealbumin - low but improved, most recent 15.2  NUTRITIONAL GOALS                                                                                             RD recs: Kcal/day: 2000-2240, protein/day: 95-105g  Custom TPN to provide 99g protein per day and 1915 kcal/day at goal rate of 75 ml/hr (96% of estimated needs per RD)  PLAN                                                                                                                           At 1800 today:  Continue custom TPN at goal rate of 75 ml/hr  No changes to electrolytes from yesterday; Cl:Ac = 1:2  TPN to contain standard multivitamins daily and trace elements on MWF due to national shortage  IVF  per MD (currently none)  TPN  lab panels on Mondays & Thursdays  Bmet, Phos tomorrow  Bernadene Personrew Collins Dimaria, PharmD, BCPS [phone] 04/24/2019, 10:01 AM

## 2019-04-24 NOTE — Progress Notes (Signed)
PROGRESS NOTE    Molly Beck  WUJ:[id number] DOB: 11/26/1965 DOA: 04/04/2019 PCP: Clementeen Graham, [name]  Brief Narrative:  Molly SCHWEGEL a 53 y.o.femalewith medical history significant ofGERD, elective surgery of her right foot a week ago presenting to the hospital for evaluation of abdominal pain.Patient states she had areconstructive right foot surgery done a week ago. States since then she has been constipated due to taking pain medications. Her last bowel movement was prior to the surgery. For the past few days she is having left lower quadrant abdominal pain which became worse for the past 24 hours. The pain is sharp and excruciating. States today she called her surgeon's office and was advised to take magnesium citrate for constipation. She vomited soon after taking magnesium citrate. Denies any fevers. She continues to complain of abdominal pain despite receiving a dose ofmorphine and multiple doses of fentanyl in the ED. CT abdomen pelvis showing acute sigmoid colitis with small foci of adjacent gas that may be extra luminal and may indicate microperforation. No abscess or drainable fluid collection. She was admitted for further treatment and care.  General surgery was consulted.  **Interim History Patient with improved abdominal discomfort overnight following insertion of NG tube with significant output.  Repeat imaging studies suggest continued small bowel obstruction.  Intra-abdominal drains continue to drain well.    Patient is continued on TPN because of lack of progression and because of concern for bilious drainage she underwent surgical intervention on 04/18/2019 and urology was consulted for bilateral stent placement and cystoscopy.  General surgery did exploratory laparotomy with small bowel resection and Hartman's procedure along with urology doing a cystoscopy with bilateral stent placement.  Today patient was in a lot of pain so surgery was admitted  Dilaudid PCA.  Patient was complaining of significant pressure in her bladder felt that she had a urinary tract infection however it turned out that her catheter was blocked and she is re-cath and 1 L was removed.    Foley catheter was removed and pain medications are being adjusted and increased.  Patient undergoing NG tube clamping and has been ambulating the halls.  Better controlled with the PCA Dilaudid.  Surgery recommending IV antibiotics for 5 days total Post-op after Surgical Intervention (Today).  Will likely need to have her ureteral stenting removed eventually.  Continue TNA nutrition for now until bowels are improved continue to mobilize.  Her surgery can add IV Tylenol pain increased  Assessment & Plan:   Principal Problem:   Perforated sigmoid colon  Active Problems:   Colitis   Sepsis (HCC)   Hypokalemia   H/O foot surgery  Sepsis secondary to Acute Sigmoid Colitis with Microperforation complicated by 2 Absecesses status post drain placement and now exploratory laparotomy with Hartman's procedure a small bowel resection along with cystoscopy and bilateral stent placement SBO Sepsis, present on admission and physiology is improving -Patient presenting to ED with progressive abdominal pain.  CT abdomen/pelvis notable for acute sigmoid colitis with adjacent gas likely secondary to microperforation.   -Patient was initially started on IV Zosyn.  During her initial hospitalization, her abdominal pain continued not to improve with resultant fevers and elevated white count.  Repeat CT abdomen/pelvis reveals marked worsening in the pelvis with 2 abscesses.  Interventional radiology was consulted and placed two drains on 04/10/2019.  -General surgery/IR following, appreciate assistance -Blood culture 04/05/2019 no growth x5 days -Wound culture 04/10/2019: Abundant mixed organisms, none predominant -IV Zosyn now Discontinued by General Surgery  -  NGT placement 04/16/2019 per general surgery and  they are trying to clamp and see if they can be removed but there are recommending continuing NG tube and await bowel function return -TPN per pharmacy -Continue to monitor drain output and BMs -Because of minimal improvement and now with green bile color in anterior abdominal drain, General Surgery recommending exploratory laparotomy and patient underwent a Hartman's procedure and small bowel resection; she is postoperative day 4 today -Urology Consulted by General Surgery for Bilateral Renal Stents and Cystoscopy; Foley catheter was replaced and now has been removed and will need her ureteral stenting removed in the outpatient setting -Last SARS-CoV-2 testing was negative on 04/05/2019 however this was repeated prior to Surgery  -Currently getting TPN at 75 mL's per hour -KUB on 04/18/2019 showed "Improving small bowel obstruction. Contrast in the colon now reaches the rectum. Stable percutaneous drains in the pelvis."  -Continue supportive care and antiemetics -Pain control per general surgery as they adding a PCA with Dilaudid and continue Toradol for now; PCA medications have been increased for better pain control  Rash -Maculopapular rash bilateral distribution on her back.  -Could be due to heat/sweat rash or contact from bedsheets.   -She has tolerated IV Zosyn for the past week without any signs of allergic reaction.  This does not appear to be an reaction to Zosyn at this time.   -Continue IV Benadryl and hydrocortisone cream prn.  -Continue hygiene and change of bed sheets daily  Recent right foot surgery and Tendon Repair -Currently in a cast, plans to follow-up with her orthopedic surgeon as an outpatient -PT following -Continue nonweightbearing right lower extremity  Hypokalemia -K+ was 3.9 this AM -Continue to Monitor and Replete as Necessary but likely will be replete in the TPN -Repeat CMP in AM  Normocytic Anmeia  -Patient's Hgb/Hct stable and is now 9.9/30.6 ->  8.6/27.0 -> 8.5/27.1 -> 7.9/25.3 -> 7.9/25.2 -Anemia panel done and showed an iron level of 16, U IBC 189, TIBC of 205, saturation ratios of 8%, ferritin level 171, folate level 9.5, and vitamin B12 848 -Continue to Monitor for S/Sx of Bleeding; Currently no overt Bleeding  -Repeat CBC in AM   Hyperglycemia -Likely reactive and in the setting of TPN; CBG's ranging from 100-118 -HbA1c was 4.9 on 04/14/2019 -Glucose this a.m. on CMP was 128 -CBG's ranging from 88-108  GERD -Continue with PPI IV with Pantoprazole 40 mg q24h until  able to take p.o.  Anxiety -Continue with Lorazepam 0.5 mg IV every 6 as needed for Anxiety  Obesity -Estimated body mass index is 30.84 kg/m as calculated from the following:   Height as of this encounter: 5\' 4"  (1.626 m).   Weight as of this encounter: 81.5 kg. -Weight Loss and Dietary Counseling given   Leukocytosis -In the setting of surgery and reactive -WBC improved and now 5.8 -Continue to monitor for signs and symptoms of infection -Repeat CBC in a.m.  Abnormal ALT -Patient's ALT was 45 and ? If elevated in the setting of surgery; now trended down and is 30 -If consistently elevated or worsening will obtain right upper quadrant ultrasound as well as acute hepatitis panel -Continue monitor and trend repeat CMP in a.m.  Thrombocytosis   -Likely reactive in the setting of antibiotic use -Platelet count is trending down and now 513,000 -Continue monitor and trend -Repeat CBC in AM   Hypernatremia, improved -Patient sodium level this morning was 145 -In the setting of TPN administration -Continue monitor and  trend and repeat sodium level in a.m.  DVT prophylaxis: Enoxaparin 40 mg sq q24h Code Status: FULL CODE  Family Communication: No family present at bedside  Disposition Plan: Remain Inpatient as patient is going for Surgical Intervention   Consultants:   Interventional Radiology  General Surgery  Orthopedic Surgery    Urology   Procedures  IR drain placement x 2 04/10/2019  PICC RUE 04/10/2019  Surgical Intervention and Cystoscopy by Dr. Maisie Fushomas and MacDiarmid   Antimicrobials:  Anti-infectives (From admission, onward)   Start     Dose/Rate Route Frequency Ordered Stop   04/23/19 0200  piperacillin-tazobactam (ZOSYN) IVPB 3.375 g     3.375 g 12.5 mL/hr over 240 Minutes Intravenous Every 8 hours 04/23/19 0144 04/23/19 0832   04/16/19 0600  cefoTEtan (CEFOTAN) 2 g in sodium chloride 0.9 % 100 mL IVPB     2 g 200 mL/hr over 30 Minutes Intravenous On call to O.R. 04/15/19 1505 04/17/19 0559   04/05/19 0600  piperacillin-tazobactam (ZOSYN) IVPB 3.375 g  Status:  Discontinued     3.375 g 12.5 mL/hr over 240 Minutes Intravenous Every 8 hours 04/05/19 0121 04/23/19 0144   04/05/19 0000  piperacillin-tazobactam (ZOSYN) IVPB 3.375 g     3.375 g 100 mL/hr over 30 Minutes Intravenous  Once 04/04/19 2355 04/05/19 0119     Subjective: Seen and examined and states that she had an okay night.  Has not been up to ambulate yet.  NG tube is now hooked back to suction.  No other concerns or complaints at this time but has been more anxious.  Objective: Vitals:   04/24/19 0638 04/24/19 0836 04/24/19 1056 04/24/19 1144  BP: (!) 134/95  (!) 119/92   Pulse: (!) 105  (!) 125   Resp: 15 14 10 14   Temp: 99.1 F (37.3 C)  98.4 F (36.9 C)   TempSrc: Oral  Oral   SpO2: 96% 99% 100% 100%  Weight:      Height:        Intake/Output Summary (Last 24 hours) at 04/24/2019 1149 Last data filed at 04/24/2019 1007 Gross per 24 hour  Intake 2582.29 ml  Output 3310 ml  Net -727.71 ml   Filed Weights   04/10/19 1059 04/16/19 1700 04/17/19 1050  Weight: 78.9 kg 79.7 kg 81.5 kg   Examination: Physical Exam:  Constitutional: Well-nourished, well-developed overweight Caucasian female currently no acute distress sitting talking with her daughter. Respiratory: Slight diminished auscultation bilaterally no appreciable  wheezing cortical rhonchi.  Patient is not tachypneic or using any accessory muscles of breathing Cardiovascular: Regular rate and rhythm. Abdomen: Soft, mildly tender.  Has a surgical drain as well as an ostomy and a midline incision GU: Deferred Neurologic: Cranial nerves II through XII grossly intact no appreciable focal deficits Psychiatric: Pleasant mood and affect.  Intact judgment and insight.  Patient is awake, alert, oriented x3  Data Reviewed: I have personally reviewed following labs and imaging studies  CBC: Recent Labs  Lab 04/19/19 0412 04/20/19 0408 04/21/19 1039 04/22/19 0319 04/23/19 0339 04/24/19 0436  WBC 11.6* 11.3* 8.1 7.8 5.7 5.8  NEUTROABS 9.1* 8.5*  --   --  4.0 4.2  HGB 9.9* 8.6* 8.6* 8.5* 7.9* 7.9*  HCT 30.6* 27.0* 27.0* 27.1* 25.3* 25.2*  MCV 94.7 95.1 96.1 96.8 98.4 98.1  PLT 584* 532* 581* 615* 538* 513*   Basic Metabolic Panel: Recent Labs  Lab 04/19/19 0412  04/20/19 0408 04/21/19 1039 04/22/19 0319 04/23/19 [id number] 04/24/19 [id number]  NA 134*   < > 137 143 144 146* 145  K 4.4   < > 4.0 3.7 3.6 3.5 3.9  CL 101   < > 101 105 104 108 113*  CO2 25   < > 29 30 34* 29 24  GLUCOSE 128*   < > 116* 128* 118* 115* 118*  BUN 22*   < > 27* 26* 36* 33* 28*  CREATININE 0.61   < > 0.65 0.57 0.82 0.64 0.53  CALCIUM 8.3*   < > 8.2* 8.5* 8.6* 8.6* 8.7*  MG 1.9  --  2.1 2.1  --  2.0 2.0  PHOS 3.3  --  4.5  --   --  3.9 3.4   < > = values in this interval not displayed.   GFR: Estimated Creatinine Clearance: 84.9 mL/min (by C-G formula based on SCr of 0.53 mg/dL). Liver Function Tests: Recent Labs  Lab 04/19/19 0412 04/19/19 1848 04/23/19 0339 04/24/19 0436  AST 21 17 20 22   ALT 45* 36 26 30  ALKPHOS 84 85 105 117  BILITOT 0.8 0.6 1.0 0.6  PROT 6.2* 6.0* 6.1* 6.3*  ALBUMIN 2.5* 2.5* 2.4* 2.5*   No results for input(s): LIPASE, AMYLASE in the last 168 hours. No results for input(s): AMMONIA in the last 168 hours. Coagulation Profile: No results for  input(s): INR, PROTIME in the last 168 hours. Cardiac Enzymes: No results for input(s): CKTOTAL, CKMB, CKMBINDEX, TROPONINI in the last 168 hours. BNP (last 3 results) No results for input(s): PROBNP in the last 8760 hours. HbA1C: No results for input(s): HGBA1C in the last 72 hours. CBG: Recent Labs  Lab 04/22/19 0718 04/22/19 1116 04/22/19 1550 04/22/19 2127 04/23/19 0111  GLUCAP 102* 94 88 108* 89   Lipid Profile: Recent Labs    04/23/19 0339  TRIG 97   Thyroid Function Tests: No results for input(s): TSH, T4TOTAL, FREET4, T3FREE, THYROIDAB in the last 72 hours. Anemia Panel: No results for input(s): VITAMINB12, FOLATE, FERRITIN, TIBC, IRON, RETICCTPCT in the last 72 hours. Sepsis Labs: No results for input(s): PROCALCITON, LATICACIDVEN in the last 168 hours.  Recent Results (from the past 240 hour(s))  Novel Coronavirus, NAA (hospital order; send-out to ref lab)     Status: None   Collection Time: 04/18/19  8:45 AM   Specimen: Nasopharyngeal Swab; Respiratory  Result Value Ref Range Status   SARS-CoV-2, NAA NOT DETECTED NOT DETECTED Final    Comment: (NOTE) This test was developed and its performance characteristics determined by [employer]. This test has not been FDA cleared or approved. This test has been authorized by FDA under an Emergency Use Authorization (EUA). This test is only authorized for the duration of time the declaration that circumstances exist justifying the authorization of the emergency use of in vitro diagnostic tests for detection of SARS-CoV-2 virus and/or diagnosis of COVID-19 infection under section 564(b)(1) of the Act, 21 U.S.C. 093ATF-5(D)(3), unless the authorization is terminated or revoked sooner. When diagnostic testing is negative, the possibility of a false negative result should be considered in the context of a patient's recent exposures and the presence of clinical signs and symptoms consistent with COVID-19. An  individual without symptoms of COVID-19 and who is not shedding SARS-CoV-2 virus would expect to have a negative (not detected) result in this assay. Performed  At: [facility] [address] [place], [place] [id number] Rush Farmer MD WC:[id number]    [place]  Final    Comment: Performed at  [facility], [address] [place], [place] [address]    Radiology Studies: No results found. Scheduled Meds:  enoxaparin (LOVENOX) injection  40 mg Subcutaneous Q24H   fluticasone  1 spray Each Nare Daily   HYDROmorphone   Intravenous Q4H   ketorolac  30 mg Intravenous Q8H   lip balm  1 application Topical BID   pantoprazole (PROTONIX) IV  40 mg Intravenous QHS   sodium chloride flush  10-40 mL Intracatheter Q12H   Continuous Infusions:  methocarbamol (ROBAXIN) IV 1,000 mg (04/24/19 0839)   TPN ADULT (ION) 75 mL/hr at 04/23/19 1831   TPN ADULT (ION)      LOS: 19 days   Merlene Laughtermair Latif Sheddrick Lattanzio, DO [facility] PAGER is on AMION  If 7PM-7AM, please contact night-coverage [email or web address] Password [email or web address] 04/24/2019, 11:49 AM

## 2019-04-24 NOTE — Consult Note (Signed)
[facility] Nurse ostomy consultnote Stoma type/location:LMQ colostomy. Temporary. Stomal assessment/size:1 and 1/2 inch, round, edematous, red, budded, os at center Peristomal assessment:intact. Medial edge is at umbilical crease. Treatment options for stomal/peristomal skin:skin barrier ring Output: scant amt tan liquid Ostomy pouching: 2pc.2 and 1/4 inch pouching system with skin barrier ring Education provided:Explained role of ostomy nurse and creation of stoma with patient and daughter present for teaching session Explained stoma characteristics (budded, flush, color, texture, care) Demonstrated pouch change (cutting new skin barrier, measuring stoma, cleaning peristomal skin and stoma, use of barrier ring) 5 sets of supplies left at the bedside for staff nurse use. Pt and daughter were able to open and close velcro to empty and assisted with pouch application process.   Answered patient questions, reviewed pouching procedures and ordering supplies after discharge. Enrolled patient in [facility] program: Yes [facility] team will continue to follow. Julien Girt MSN, RN, [facility], [place], [place]

## 2019-04-24 NOTE — Progress Notes (Addendum)
6 Days Post-Op  Subjective: CC: Anxiety Patient reports she had a bad night. Was very anxious about having her daughter Debe Coder seeing her "like this" when she comes this morning for a colostomy bag change teaching. She just received some Ativan.   Patient reports abdominal pain has improved slightly. Feels pain medication regimen is adequate. No nausea. NGT with 900cc output. No air or stool in colostomy bag. Denies flatus. Has been getting up and mobilizing in halls. Daughter is going to bring scooter in today.   Objective: Vital signs in last 24 hours: Temp:  [97.5 F (36.4 C)-99.1 F (37.3 C)] 99.1 F (37.3 C) (06/23 3149) Pulse Rate:  [100-119] 105 (06/23 0638) Resp:  [12-17] 14 (06/23 0836) BP: (120-138)/(84-95) 134/95 (06/23 0638) SpO2:  [96 %-100 %] 99 % (06/23 0836) FiO2 (%):  [99 %-100 %] 100 % (06/23 0836) Last BM Date: (ostomy placed 6/17; scant liquid output)  Intake/Output from previous day: 06/22 0701 - 06/23 0700 In: 2519.5 [P.O.:420; I.V.:1720.7; IV Piggyback:378.9] Out: 7026 [Urine:3450; Emesis/NG output:900; Drains:30] Intake/Output this shift: Total I/O In: -  Out: 300 [Urine:300]  PE: Gen: Awake and alert, NAD Heart: Regular Lungs: CTA b/l, normal effort and rate.  Abd: Soft, ND, appropriately tender around midline incision. There is mild erythema between staples 3-4 (top down) that appear reactive rather than infectious. Colostomy bag in place, decompressed without air or stool in bag. Stoma is red, budded with os at center. Hypoactive bowel sounds. NG tube in place on LIWS. 900cc recorded overnight, bilious. JP drain with SS fluid. 30cc/24 hours. Msk: Right leg cast. Left lower leg without edema or calf tenderness.   Lab Results:  Recent Labs    04/23/19 0339 04/24/19 0436  WBC 5.7 5.8  HGB 7.9* 7.9*  HCT 25.3* 25.2*  PLT 538* 513*   BMET Recent Labs    04/23/19 0339 04/24/19 0436  NA 146* 145  K 3.5 3.9  CL 108 113*  CO2 29 24   GLUCOSE 115* 118*  BUN 33* 28*  CREATININE 0.64 0.53  CALCIUM 8.6* 8.7*   PT/INR No results for input(s): LABPROT, INR in the last 72 hours. CMP     Component Value Date/Time   NA 145 04/24/2019 0436   K 3.9 04/24/2019 0436   CL 113 (H) 04/24/2019 0436   CO2 24 04/24/2019 0436   GLUCOSE 118 (H) 04/24/2019 0436   BUN 28 (H) 04/24/2019 0436   CREATININE 0.53 04/24/2019 0436   CALCIUM 8.7 (L) 04/24/2019 0436   PROT 6.3 (L) 04/24/2019 0436   ALBUMIN 2.5 (L) 04/24/2019 0436   AST 22 04/24/2019 0436   ALT 30 04/24/2019 0436   ALKPHOS 117 04/24/2019 0436   BILITOT 0.6 04/24/2019 0436   GFRNONAA >60 04/24/2019 0436   GFRAA >60 04/24/2019 0436   Lipase     Component Value Date/Time   LIPASE 25 09/12/2015 1615       Studies/Results: No results found.  Anti-infectives: Anti-infectives (From admission, onward)   Start     Dose/Rate Route Frequency Ordered Stop   04/23/19 0200  piperacillin-tazobactam (ZOSYN) IVPB 3.375 g     3.375 g 12.5 mL/hr over 240 Minutes Intravenous Every 8 hours 04/23/19 0144 04/23/19 0832   04/16/19 0600  cefoTEtan (CEFOTAN) 2 g in sodium chloride 0.9 % 100 mL IVPB     2 g 200 mL/hr over 30 Minutes Intravenous On call to O.R. 04/15/19 1505 04/17/19 0559   04/05/19 0600  piperacillin-tazobactam (ZOSYN)  IVPB 3.375 g  Status:  Discontinued     3.375 g 12.5 mL/hr over 240 Minutes Intravenous Every 8 hours 04/05/19 0121 04/23/19 0144   04/05/19 0000  piperacillin-tazobactam (ZOSYN) IVPB 3.375 g     3.375 g 100 mL/hr over 30 Minutes Intravenous  Once 04/04/19 2355 04/05/19 0119       Assessment/Plan  GERD  Osteoarthritis - takes meloxicam and tramadol at baseline  Recent R flatfoot reconstruction and lapidus bunion correction surgery by Dr. Hewitt5/26/2020 - per note on 6/15 "patient is fine to stay in her post-op splint until she is D/C'd from the hospital" & "stitches can stay in without causing any type of difficulty in regards to her wound  healing"  Protein Calorie Malnutrition - prealbumin <5 (6/10) -> 8.3 (6/15) -> 12.3 (6/22), on TPN  Anemia - Hgb 7.9 & stable (monitor)  Tachycardia - HR 100-119 last 24 hours. It appears it has been between 90-120's since admission. No EKG on file. Will get EKG.   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 Maisie Fushomas - 04/18/2019 - POD #6 - Cystoscopy with Stent placement - Dr. Sherron MondayMacDiarmid - 6/17 - Foley out, voiding on own without difficulty. Ureteral stents per Urology.  - Post-operative ileus. Continue NGT and await bowel function - Pain much improved. Continue Dilaudid PCA, Toradol and Robaxin. Can add IV Tylenol if pain increases.  - Continue TPN for nutrition. Pre-albumin up to 12.3 (6/22) - Continue surgical drain.  - Mobilizing more, continue to encourage. PT. Daughter to bring in scooter today - Encourage IS use.  - Completed 5 day course of post-op Zosyn - Surgical pathology benign. Diverticular disease with transmural defect. One benign lymph node, serosal adhesions, margins histologically viable, no evidence of malignancy   FEN - NPO, TPN, K 3.9, Mg 2.0 VTE - SCDs, Lovenox, Mobilize as able.  ID - Completed 5 day course of post-op Zosyn (6/17 - 6/22), afebrile, WBC wnl Foley - Removed Follow-Up: Dr. Maisie Fushomas. Dr. Sherron MondayMacDiarmid (Urology)   LOS: 19 days    Jacinto HalimMichael M Maczis , [facility] 04/24/2019, 9:28 AM Pager: [phone]

## 2019-04-25 DIAGNOSIS — Z0189 Encounter for other specified special examinations: Secondary | ICD-10-CM

## 2019-04-25 DIAGNOSIS — K56609 Unspecified intestinal obstruction, unspecified as to partial versus complete obstruction: Secondary | ICD-10-CM

## 2019-04-25 DIAGNOSIS — R188 Other ascites: Secondary | ICD-10-CM

## 2019-04-25 LAB — BASIC METABOLIC PANEL
Anion gap: 8 (ref 5–15)
BUN: 32 mg/dL — ABNORMAL HIGH (ref 6–20)
CO2: 23 mmol/L (ref 22–32)
Calcium: 8.6 mg/dL — ABNORMAL LOW (ref 8.9–10.3)
Chloride: 114 mmol/L — ABNORMAL HIGH (ref 98–111)
Creatinine, Ser: 0.47 mg/dL (ref 0.44–1.00)
GFR calc Af Amer: >60 mL/min (ref >60)
GFR calc non Af Amer: >60 mL/min (ref >60)
Glucose, Bld: 122 mg/dL — ABNORMAL HIGH (ref 70–99)
Potassium: 3.7 mmol/L (ref 3.5–5.1)
Sodium: 145 mmol/L (ref 135–145)

## 2019-04-25 LAB — URINALYSIS, ROUTINE W REFLEX MICROSCOPIC
Bilirubin Urine: NEGATIVE
Glucose, UA: 50 mg/dL — AB
Ketones, ur: NEGATIVE mg/dL
Nitrite: NEGATIVE
Protein, ur: 100 mg/dL — AB
RBC / HPF: >50 RBC/hpf — ABNORMAL HIGH (ref 0–5)
Specific Gravity, Urine: 1.018 (ref 1.005–1.030)
pH: 8 (ref 5.0–8.0)

## 2019-04-25 LAB — PHOSPHORUS: Phosphorus: 3.4 mg/dL (ref 2.5–4.6)

## 2019-04-25 MED ORDER — LORAZEPAM 2 MG/ML IJ SOLN
0.5000 mg | Freq: Four times a day (QID) | INTRAMUSCULAR | Status: DC | PRN
  Administered 2019-04-25 – 2019-04-27 (×3): 1 mg via INTRAVENOUS
  Filled 2019-04-25 (×2): qty 1

## 2019-04-25 MED ORDER — METOCLOPRAMIDE HCL 5 MG/ML IJ SOLN
5.0000 mg | Freq: Four times a day (QID) | INTRAMUSCULAR | Status: DC | PRN
  Administered 2019-04-25 – 2019-04-26 (×3): 5 mg via INTRAVENOUS
  Filled 2019-04-25 (×3): qty 2

## 2019-04-25 MED ORDER — TRAVASOL 10 % IV SOLN
INTRAVENOUS | Status: AC
  Administered 2019-04-25: 18:00:00 via INTRAVENOUS
  Filled 2019-04-25: qty 990

## 2019-04-25 MED ORDER — DIPHENHYDRAMINE HCL 25 MG PO CAPS
25.0000 mg | ORAL_CAPSULE | Freq: Once | ORAL | Status: DC
  Filled 2019-04-25: qty 1

## 2019-04-25 MED ORDER — POTASSIUM CHLORIDE 10 MEQ/50ML IV SOLN
10.0000 meq | INTRAVENOUS | Status: AC
  Administered 2019-04-25 (×3): 10 meq via INTRAVENOUS
  Filled 2019-04-25 (×3): qty 50

## 2019-04-25 MED ORDER — DIPHENHYDRAMINE HCL 50 MG/ML IJ SOLN
25.0000 mg | Freq: Four times a day (QID) | INTRAMUSCULAR | Status: DC | PRN
  Administered 2019-04-25 – 2019-04-26 (×3): 25 mg via INTRAVENOUS
  Filled 2019-04-25 (×3): qty 1

## 2019-04-25 NOTE — Consult Note (Addendum)
[place] Nurse ostomy follow up Stoma type/location: LMQ colostomy Stomal assessment/size: measured yesterday Peristomal assessment: not seen today Treatment options for stomal/peristomal skin: skin barrier ring Output: none (trace air and no stool) Ostomy pouching: 2pc. 2 and 1/4 inch pouching system with skin barrier ring applied yesterday with daughter (who is a CNA) remains intact. No pouch change indicated until at least Friday and perhaps Monday if ileus persists.  Reglan started today. Education provided: None today. Patient has expressed increased anxiety to [facility] PA Alferd Apa; she is attempting to see if a few periodic visits could be made by her daughter for emotional support. She is uncomfortable in her chair today and I will provide a pressure redistribution chair pad for comfort. Enrolled patient in [place] Start Discharge program: Yes, previously  Note:  Patient would benefit from [facility] support following discharge. If you agree, please order.  [place] nursing team will follow for routine pouch changes, reinforcement of ostomy teaching and support. We will remain available to this patient, the nursing and medical teams.   Thanks, Maudie Flakes, MSN, RN, [place], Arther Abbott  Pager# [phone]

## 2019-04-25 NOTE — Progress Notes (Addendum)
7 Days Post-Op  Subjective: CC: Anxiety Patient reports that she has been feeling anxious. She is very tearful this morning. Enjoyed seeing her daughter yesterday. Asking if family can visit more often. I explained visitor restrictions but told her I would check. She notes that her abdomen is sore, midline where the staples are located. This is constant, worse with activity and mild. She denies any nausea. NG with 675 out overnight. No flatus from ostomy. She denies air or stool in her colostomy bag. She did get her scooter yesterday and mobilized in the halls. Using her IS. She also notes some dysuria and hematuria that began yesterday. Urinal bottle urine appears dark, maroon color.   Objective: Vital signs in last 24 hours: Temp:  [97.8 F (36.6 C)-98.8 F (37.1 C)] 98.7 F (37.1 C) (06/24 0509) Pulse Rate:  [99-125] 110 (06/24 0510) Resp:  [10-18] 16 (06/24 0600) BP: (119-129)/(83-94) 129/90 (06/24 0509) SpO2:  [97 %-100 %] 99 % (06/24 0600) FiO2 (%):  [97 %-100 %] 97 % (06/23 1510) Weight:  [80.7 kg] 80.7 kg (06/23 1233) Last BM Date: (ostomy placed 6/17; scant liquid output)  Intake/Output from previous day: 06/23 0701 - 06/24 0700 In: 2353.3 [P.O.:480; I.V.:1723.3; IV Piggyback:150] Out: 2380 [Urine:1700; Emesis/NG output:675; Drains:5] Intake/Output this shift: Total I/O In: -  Out: 350 [Emesis/NG output:350]  PE: Gen: Awake and alert, NAD Heart: Regular Lungs: CTA b/l, normal effort and rate.  Abd: Soft, ND, appropriately tender around midline incision. Midline would with staples in place and appear c/d/i without signs of infection. Colostomy bag in place, decompressed without air or stool in bag. Stoma is red, budded with os at center. More bowel sounds today. NG tube in place on LIWS. 675cc recorded/24 hours, bilious. JP drain with SS fluid. 5cc/24 hours. Tubing was stripped with some return following Msk: Right leg cast. Left lower leg without edema or calf  tenderness. Skin: Rash on back appears to have resolved. No rash. Warm and dry.   Lab Results:  Recent Labs    04/23/19 0339 04/24/19 0436  WBC 5.7 5.8  HGB 7.9* 7.9*  HCT 25.3* 25.2*  PLT 538* 513*   BMET Recent Labs    04/24/19 0436 04/25/19 0420  NA 145 145  K 3.9 3.7  CL 113* 114*  CO2 24 23  GLUCOSE 118* 122*  BUN 28* 32*  CREATININE 0.53 0.47  CALCIUM 8.7* 8.6*   PT/INR No results for input(s): LABPROT, INR in the last 72 hours. CMP     Component Value Date/Time   NA 145 04/25/2019 0420   K 3.7 04/25/2019 0420   CL 114 (H) 04/25/2019 0420   CO2 23 04/25/2019 0420   GLUCOSE 122 (H) 04/25/2019 0420   BUN 32 (H) 04/25/2019 0420   CREATININE 0.47 04/25/2019 0420   CALCIUM 8.6 (L) 04/25/2019 0420   PROT 6.3 (L) 04/24/2019 0436   ALBUMIN 2.5 (L) 04/24/2019 0436   AST 22 04/24/2019 0436   ALT 30 04/24/2019 0436   ALKPHOS 117 04/24/2019 0436   BILITOT 0.6 04/24/2019 0436   GFRNONAA >60 04/25/2019 0420   GFRAA >60 04/25/2019 0420   Lipase     Component Value Date/Time   LIPASE 25 09/12/2015 1615       Studies/Results: No results found.  Anti-infectives: Anti-infectives (From admission, onward)   Start     Dose/Rate Route Frequency Ordered Stop   04/23/19 0200  piperacillin-tazobactam (ZOSYN) IVPB 3.375 g     3.375 g  12.5 mL/hr over 240 Minutes Intravenous Every 8 hours 04/23/19 0144 04/23/19 0832   04/16/19 0600  cefoTEtan (CEFOTAN) 2 g in sodium chloride 0.9 % 100 mL IVPB     2 g 200 mL/hr over 30 Minutes Intravenous On call to O.R. 04/15/19 1505 04/17/19 0559   04/05/19 0600  piperacillin-tazobactam (ZOSYN) IVPB 3.375 g  Status:  Discontinued     3.375 g 12.5 mL/hr over 240 Minutes Intravenous Every 8 hours 04/05/19 0121 04/23/19 0144   04/05/19 0000  piperacillin-tazobactam (ZOSYN) IVPB 3.375 g     3.375 g 100 mL/hr over 30 Minutes Intravenous  Once 04/04/19 2355 04/05/19 0119       Assessment/Plan   GERD Osteoarthritis - takes  meloxicam and tramadol at baseline RecentR flatfoot reconstruction and lapidus bunion correctionsurgery by Dr. Hewitt5/26/2020- per note on 6/15 "patient is fine to stay in her post-op splint until she is D/C'd from the hospital" & "stitches can stay in without causing any type of difficulty in regards to her wound healing" Protein Calorie Malnutrition - prealbumin <5(6/10) -> 8.3 (6/15) -> 12.3 (6/22), on TPN Anemia -Hgb 7.9 & stable (monitor)   Tachycardia - HR ~100's since admission. EKG 6/23 with sinus tachycardia. No hypotension or hypoxia. Denies SOB.   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 Marcello Moores - 04/18/2019 - POD #7 - Cystoscopy with Stent placement - Dr. Matilde Sprang - 6/17 - Foley out, voiding on own without difficulty. Ureteral stents per Urology. Will check UA for hematuria and dysuria. - Post-operative ileus. Continue NGT and await bowel function. Will add Reglan IV q6hrs prn to stimulate bowel function. This is to be given w/ Benadryl to decrease risk for Tardive Dyskinesia.  - Pain much improved. Continue Dilaudid PCA, Toradol and Robaxin. Can add IV Tylenol if pain increases.  - Continue TPN for nutrition. Pre-albumin up to 12.3 (6/22) - Continue surgical drain.  - Mobilizing more, continue to encourage. PT. Daughter brought scooter today - Encourage IS use.  - Completed 5 day course of post-op Zosyn - Surgical pathology benign. Diverticular disease with transmural defect. One benign lymph node, serosal adhesions, margins histologically viable, no evidence of malignancy  FEN -NPO, TPN, K 3.7,  VTE -SCDs, Lovenox, Mobilize as able. ID -Completed 5 day course of post-op Zosyn (6/17 - 6/22), afebrile, WBC wnl Foley - Removed Follow-Up: Dr. Marcello Moores. Dr. Matilde Sprang (Urology)   LOS: 20 days    Jillyn Ledger , [facility] 04/25/2019,  8:16 AM Pager: [phone]

## 2019-04-25 NOTE — Progress Notes (Signed)
PROGRESS NOTE    FRANCHON Beck  ZOX:[id number] DOB: 08-04-1966 DOA: 04/04/2019 PCP: Clementeen Graham, [name]   Brief Narrative:  Molly Beck a 53 y.o.femalewith medical history significant ofGERD, elective surgery of her right foot a week ago presenting to the hospital for evaluation of abdominal pain.Patient states she had areconstructive right foot surgery done a week ago. States since then she has been constipated due to taking pain medications. Her last bowel movement was prior to the surgery. For the past few days she is having left lower quadrant abdominal pain which became worse for the past 24 hours. The pain is sharp and excruciating. States today she called her surgeon's office and was advised to take magnesium citrate for constipation. She vomited soon after taking magnesium citrate. Denies any fevers. She continues to complain of abdominal pain despite receiving a dose ofmorphine and multiple doses of fentanyl in the ED. CT abdomen pelvis showing acute sigmoid colitis with small foci of adjacent gas that may be extra luminal and may indicate microperforation. No abscess or drainable fluid collection. She was admitted for further treatment and care. General surgery was consulted.  **Interim History Patient with improved abdominal discomfort overnight following insertion of NG tube with significant output. Repeat imaging studies suggest continued small bowel obstruction. Intra-abdominal drains continue to drain well.   Patient is continued on TPN because of lack of progression and because of concern for bilious drainage she underwent surgical intervention on 04/18/2019 and urology was consulted for bilateral stent placement and cystoscopy.  General surgery did exploratory laparotomy with small bowel resection and Hartman's procedure along with urology doing a cystoscopy with bilateral stent placement.  Today patient was in a lot of pain so surgery was admitted  Dilaudid PCA.  Patient was complaining of significant pressure in her bladder felt that she had a urinary tract infection however it turned out that her catheter was blocked and she is re-cath and 1 L was removed.    Foley catheter was removed and pain medications are being adjusted and increased.  Patient undergoing NG tube clamping and has been ambulating the halls.  Better controlled with the PCA Dilaudid.  Surgery recommending IV antibiotics for 5 days total Post-op after Surgical Intervention (Today).  Will likely need to have her ureteral stenting removed eventually.  Continue TNA nutrition for now until bowels are improved continue to mobilize.  Her surgery can add IV Tylenol pain increased    Assessment & Plan:   Principal Problem:   Perforated sigmoid colon  Active Problems:   Colitis   Sepsis (HCC)   Hypokalemia   H/O foot surgery   Sepsis secondary to Acute Sigmoid Colitis with Microperforation complicated by 2 Absecesses status post drain placement and now exploratory laparotomy with Hartman's procedure a small bowel resection along with cystoscopy and bilateral stent placement SBO Sepsis, present on admission and physiology is improving -Patient presenting to ED with progressive abdominal pain. CT abdomen/pelvis notable for acute sigmoid colitis with adjacent gas likely secondary to microperforation.  -Patient was initially started on IV Zosyn. During her initial hospitalization, her abdominal pain continued not to improve with resultant fevers and elevated white count. Repeat CT abdomen/pelvis reveals marked worsening in the pelvis with 2 abscesses. Interventional radiology was consulted and placed two drains on 04/10/2019.  -General surgery/IR following, appreciate assistance -Blood culture 04/05/2019 no growth x5 days -Wound culture 04/10/2019: Abundant mixed organisms, none predominant -IV Zosyn now Discontinued by General Surgery  -NGT placement6/15/2020per general  surgery  and they are trying to clamp and see if they can be removed but there are recommending continuing NG tube and await bowel function return -TPN per pharmacy -Continue to monitor drain output and BMs -Because of minimal improvement and now with green bile color in anterior abdominal drain, General Surgery recommending exploratory laparotomy and patient underwent a Hartman's procedure and small bowel resection; she is postoperative day 4 today -Urology Consulted by General Surgery for Bilateral Renal Stents and Cystoscopy; Foley catheter was replaced and now has been removed and will need her ureteral stenting removed in the outpatient setting -Last SARS-CoV-2 testing was negative on 04/05/2019 however this was repeated prior to Surgery  -Currently getting TPN at 75 mL's per hour -KUB on 04/18/2019 showed "Improving small bowel obstruction. Contrast in the colon now reaches the rectum. Stable percutaneous drains in the pelvis."  -Continue supportive care and antiemetics -Pain control per general surgery   Rash -Maculopapular rash bilateral distribution on her back, mod improvemernt.  -likley due to heat/sweat rash or contact from bedsheets.  -She has tolerated IV Zosyn for the past week without any signs of allergic reaction. I agree, this does not appear to be an reaction to Zosyn at this time.  -Continue IV Benadryl and hydrocortisone cream prn.  -Continue hygiene and change of bed sheets daily  Recent right foot surgery and Tendon Repair -Currently in a cast, plans to follow-up with her orthopedic surgeon as an outpatient -PT following -Continue nonweightbearing right lower extremity  Hypokalemia -K+ was 3.7 this AM -Continue to Monitor- pt is on TPN -Repeat CMP in AM  Normocytic Anmeia  -Patient's Hgb/Hct stable and is STABLE, WILL CONTINUE TO MONITOR -Anemia panel done and showed an iron level of 16, U IBC 189, TIBC of 205, saturation ratios of 8%, ferritin level 171,  folate level 9.5, and vitamin B12 848  Hyperglycemia -Likely reactive and in the setting of TPN; CBG's ranging from 100-118 -HbA1c was 4.9 on 04/14/2019 -Glucose this a.m. on CMP was 128 -CBG's ranging from 88-108  GERD -Continue with PPI IV with Pantoprazole 40 mg q24h until  able to take p.o.  Anxiety -Continue with Lorazepam 0.5 mg IV every 6 as needed for Anxiety  Obesity -Estimated body mass index is 30.84 kg/m as calculated from the following:  -Weight Loss and Dietary Counseling given   Leukocytosis -In the setting of surgery and reactive -WBC improved and now 5.8 -Continue to monitor for signs and symptoms of infection-none evident currently -Repeat CBC in a.m.  Abnormal ALT -resolved  Thrombocytosis   -Likely reactive, currently 513 and trending down -Monitor with CBC  Hypernatremia, improved -Patient sodium level this morning was 145 -In the setting of TPN administration -Continue monitor and trend and repeat sodium level in a.m.  DVT prophylaxis: Lovenox SQ  Code Status: full    Code Status Orders  (From admission, onward)         Start     Ordered   04/05/19 0100  Full code  Continuous     04/05/19 0101        Code Status History    This patient has a current code status but no historical code status.   Advance Care Planning Activity     Family Communication: None today Disposition Plan:   Per primary team Consults called: None today Admission status: Inpatient   Consultants:   Interventional radiology, general surgery, orthopedic surgery, urology  Procedures:  Ct Abdomen Pelvis W Contrast  Result Date: 04/15/2019  CLINICAL DATA:  Severe lower abdominal pain. Unable to eat or drink. History of diverticulitis with recent abscess. Drain in place. EXAM: CT ABDOMEN AND PELVIS WITH CONTRAST TECHNIQUE: Multidetector CT imaging of the abdomen and pelvis was performed using the standard protocol following bolus administration of  intravenous contrast. CONTRAST:  OMNIPAQUE IOHEXOL 300 MG/ML SOLN, 30mL OMNIPAQUE IOHEXOL 300 MG/ML SOLN COMPARISON:  Procedures CT 04/10/2019. CT of the abdomen and pelvis 04/09/2019 FINDINGS: Lower chest: The lung bases are clear without focal nodule, mass, or airspace disease. Small pericardial effusion is similar to prior studies. No pleural effusions are present. Hepatobiliary: No focal liver abnormality is seen. No gallstones, gallbladder wall thickening, or biliary dilatation. Distension of the gallbladder is likely secondary to fasting. Pancreas: Unremarkable. No pancreatic ductal dilatation or surrounding inflammatory changes. Spleen: Normal in size without focal abnormality. Adrenals/Urinary Tract: Adrenal glands are normal bilaterally. Kidneys and ureters are unremarkable. There is no nodule or mass lesion. No stone is present. There is no obstruction. Stomach/Bowel: The stomach is moderately distended and fluid filled. There is marked distension of proximal small bowel loops measuring up to 3.8 cm in transverse diameter. Transition point is in the anatomic pelvis adjacent to the more anterior and superior abscess. The distal small bowel is collapsed. Contrast is present in the colon. Gas and stool are present throughout the colon to the rectum. There is a small amount of contrast in the rectum. Inflammatory changes about the sigmoid colon are improving. Vascular/Lymphatic: No significant vascular calcifications are present. Subcentimeter lymph nodes are likely reactive. Reproductive: Uterus and bilateral adnexa are unremarkable. IUD is in place. Other: Anterior and posterior drainage catheters are stable in position. The anterior collection is collapsed. There is an adjacent collection which may be separate from the drain measuring 2.0 x 2.4 x 1.9 cm. The posterior drain is in place. Previously noted inferior pelvic collection is markedly reduced in size. This collection now measures 3.2 x 6.0 x  3.4 cm. There is still gas with a fluid collection. No other focal fluid collections are present. Musculoskeletal: Vertebral body heights and alignment are maintained. No focal lytic or blastic lesions are present. Small Schmorl's nodes are present. Bony pelvis is within normal limits. The hips are located. Severe degenerative changes are again noted in the left hip. More mild degenerative changes are present on the right. IMPRESSION: 1. Dilated small bowel with a transition point in the anatomic pelvis adjacent to the inflammation is consistent with small bowel obstruction. No discrete bowel injury is evident. 2. The anterior abscess cavity has collapsed. 3. Adjacent small fluid collection measures 2.0 x 2.4 x 1.9 cm. 4. Marked reduction in size of the more inferior pelvic collection, now measuring 3.2 x 6.0 x 3.4 cm. Pigtail drainage catheter remains in place. 5. Reduced inflammatory changes surrounding the sigmoid colon. Electronically Signed   By: Marin Roberts M.D.   On: 04/15/2019 15:26   Ct Abdomen Pelvis W Contrast  Result Date: 04/09/2019 CLINICAL DATA:  Elevated white blood cell count and patient with abdominal pain for 1 week. EXAM: CT ABDOMEN AND PELVIS WITH CONTRAST TECHNIQUE: Multidetector CT imaging of the abdomen and pelvis was performed using the standard protocol following bolus administration of intravenous contrast. CONTRAST:  100 mL OMNIPAQUE IOHEXOL 300 MG/ML  SOLN COMPARISON:  CT abdomen pelvis 04/04/2019. FINDINGS: Lower chest: Heart size is normal. No pleural or pericardial effusion. Minimal right basilar atelectasis noted. Hepatobiliary: The gallbladder is distended but otherwise unremarkable. The liver and biliary tree  appear normal. Pancreas: Unremarkable. No pancreatic ductal dilatation or surrounding inflammatory changes. Spleen: Normal in size without focal abnormality. Adrenals/Urinary Tract: Small cyst lower pole right kidney incidentally noted. The kidneys otherwise appear  normal. Ureters and urinary bladder are normal in appearance. Stomach/Bowel: Extensive inflammatory change about the sigmoid colon has markedly worsened. There is an air and fluid collection posterior and to the right of the uterus which measures approximately 6.5 cm transverse by 9 cm AP by 5 cm craniocaudal consistent with an abscess. A second air and fluid collection consistent with abscess more superiorly in the central pelvis measures 4 cm AP by 3.5 cm transverse by 7.5 cm craniocaudal. This collection is contains predominantly gas and it overlies a loop of small bowel which appears inflamed. Presacral edema is identified. The walls of the sigmoid colon are markedly thickened inflamed. The colon otherwise appears normal. The stomach, small bowel and appendix are normal in appearance. Vascular/Lymphatic: No significant vascular findings are present. No enlarged abdominal or pelvic lymph nodes. Reproductive: Uterus and bilateral adnexa are unremarkable. IUD noted. Other: None. Musculoskeletal: No acute or focal bony abnormality. Severe left hip osteoarthritis noted. IMPRESSION: Marked worsening findings in the pelvis most consistent with acute sigmoid diverticulitis with 2 new abscesses identified as described above. These results were called by telephone at the time of interpretation on 04/09/2019 at 2:03 pm to [facility] Who verbally acknowledged these results. Electronically Signed   By: Inge Rise M.D.   On: 04/09/2019 14:07   Ct Abdomen Pelvis W Contrast  Result Date: 04/04/2019 CLINICAL DATA:  Abdominal pain and decreased bowel movement since surgery EXAM: CT ABDOMEN AND PELVIS WITH CONTRAST TECHNIQUE: Multidetector CT imaging of the abdomen and pelvis was performed using the standard protocol following bolus administration of intravenous contrast. CONTRAST:  120mL OMNIPAQUE IOHEXOL 300 MG/ML  SOLN COMPARISON:  None. FINDINGS: LOWER CHEST: There is no basilar pleural or apical  pericardial effusion. HEPATOBILIARY: The hepatic contours and density are normal. There is no intra- or extrahepatic biliary dilatation. The gallbladder is normal. PANCREAS: The pancreatic parenchymal contours are normal and there is no ductal dilatation. There is no peripancreatic fluid collection. SPLEEN: Normal. ADRENALS/URINARY TRACT: --Adrenal glands: Normal. --Right kidney/ureter: No hydronephrosis, nephroureterolithiasis, perinephric stranding or solid renal mass. --Left kidney/ureter: No hydronephrosis, nephroureterolithiasis, perinephric stranding or solid renal mass. --Urinary bladder: Normal for degree of distention STOMACH/BOWEL: --Stomach/Duodenum: There is no hiatal hernia or other gastric abnormality. The duodenal course and caliber are normal. --Small bowel: No dilatation or inflammation. --Colon: There is mild inflammation of the sigmoid colon. There are small foci of gas along the anterior contour of the distal sigmoid colon that may indicate microperforation (coronal images 43, 46, 47). Large amount of colonic stool. --Appendix: Not clearly visualized. VASCULAR/LYMPHATIC: Normal course and caliber of the major abdominal vessels. No abdominal or pelvic lymphadenopathy. REPRODUCTIVE: There is a T-shaped contraceptive device within the uterus. MUSCULOSKELETAL. No bony spinal canal stenosis or focal osseous abnormality. OTHER: None. IMPRESSION: Acute sigmoid colitis with small foci of adjacent gas that may be extraluminal. This may indicate micro perforation. No abscess or drainable fluid collection. Electronically Signed   By: Ulyses Jarred M.D.   On: 04/04/2019 23:32   Dg Abd Portable 1v  Result Date: 04/18/2019 CLINICAL DATA:  Small bowel obstruction. EXAM: PORTABLE ABDOMEN - 1 VIEW COMPARISON:  April 17, 2019 FINDINGS: Two drains are seen in the pelvis, in stable position. Contrast is seen in the colon, extending into the rectum. Few mildly prominent  loops of small bowel remain in the central  abdomen measuring up to 3.3 cm. The more dilated loops superiorly is decreased in caliber. No free air, portal venous gas, or pneumatosis. IMPRESSION: Improving small bowel obstruction. Contrast in the colon now reaches the rectum. Stable percutaneous drains in the pelvis. Electronically Signed   By: Gerome Samavid  Williams III M.D   On: 04/18/2019 08:12   Dg Abd Portable 1v  Result Date: 04/17/2019 CLINICAL DATA:  Small bowel obstruction. EXAM: PORTABLE ABDOMEN - 1 VIEW COMPARISON:  One-view abdomen 04/16/2019 FINDINGS: Dilated loops of small bowel in the upper abdomen is similar the prior study. More distal loops demonstrate some decompression appear to the most recent x-ray. Surgical drains remain in place. No definite free air is present. Contrast is again noted in the colon. IMPRESSION: 1. Similar dilation of transverse loop of small bowel in the upper abdomen. 2. The remainder of the small bowel has begun to decompressed. 3. Drainage catheters are stable. Electronically Signed   By: Marin Robertshristopher  Mattern M.D.   On: 04/17/2019 10:38   Dg Abd Portable 1v-small Bowel Obstruction Protocol-initial, 8 Hr Delay  Result Date: 04/16/2019 CLINICAL DATA:  Small bowel obstruction. 8 hour delayed film. EXAM: PORTABLE ABDOMEN - 1 VIEW COMPARISON:  CT yesterday. FINDINGS: Enteric contrast in the colon, however unclear if this contrast was from prior CT or administered for small bowel protocol. There is residual contrast in the stomach. Persistent dilated small bowel in the central abdomen a 5.1 cm. Two drainage catheters overlie the pelvis. Advanced left hip osteoarthritis. IMPRESSION: Enteric contrast in the colon, however it is unclear if this is residual contrast that was seen on CT yesterday versus that administered for the small-bowel protocol. There is residual contrast in the stomach and persistent dilated small bowel in the central abdomen. Recommend 24 hour delayed film. Electronically Signed   By: Narda RutherfordMelanie  Sanford  M.D.   On: 04/16/2019 23:43   Dg Abd Portable 1v-small Bowel Protocol-position Verification  Result Date: 04/16/2019 CLINICAL DATA:  NG tube placement. EXAM: PORTABLE ABDOMEN - 1 VIEW COMPARISON:  Abdominal CT 04/15/2019 FINDINGS: Enteric catheter projects over the expected location of gastric cardia. Persistent small bowel loop dilation with maximum diameter of 5.4 cm. No free intra-abdominal gas is seen. IMPRESSION: 1. Enteric catheter projects over the expected location of gastric cardia. 2. Persistent small bowel obstruction. Electronically Signed   By: Ted Mcalpineobrinka  Dimitrova M.D.   On: 04/16/2019 13:20   Dg C-arm 1-60 Min-no Report  Result Date: 04/18/2019 Fluoroscopy was utilized by the requesting physician.  No radiographic interpretation.   Ct Image Guided Drainage By Percutaneous Catheter  Result Date: 04/10/2019 CLINICAL DATA:  Sigmoid diverticulitis with rupture and abscesses in the central upper pelvis and deep lower posterior pelvis. EXAM: 1. CT GUIDED CATHETER DRAINAGE OF PERITONEAL DIVERTICULAR ABSCESS 2. CT-GUIDED CATHETER DRAINAGE OF PELVIC PERITONEAL ABSCESS ANESTHESIA/SEDATION: 4.0 mg IV Versed 100 mcg IV Fentanyl Total Moderate Sedation Time:  52 minutes The patient's level of consciousness and physiologic status were continuously monitored during the procedure by Radiology nursing. PROCEDURE: The procedure, risks, benefits, and alternatives were explained to the patient. Questions regarding the procedure were encouraged and answered. The patient understands and consents to the procedure. A time out was performed prior to initiating the procedure. The anterior abdominal wall followed by the right transgluteal region were prepped with chlorhexidine in a sterile fashion, and a sterile drape was applied covering the operative field. A sterile gown and sterile gloves were used for  the procedure. Local anesthesia was provided with 1% Lidocaine. An 18 gauge trocar needle was advanced from a  right anterior approach to the level of a sigmoid diverticular abscess. After confirming needle tip position, aspiration was performed. A guidewire was advanced and the needle removed. The tract was dilated over the wire. A 10 French percutaneous drainage catheter was then advanced over the wire and formed. Catheter position was confirmed by CT. Additional fluid sample was aspirated from the drain. The drainage catheter was then flushed with saline and attached to a suction bulb. A Prolene retention suture was applied at the skin exit site. The patient was then placed in a prone position and from a right transgluteal approach, an 18 gauge trocar needle was advanced to the level a second posterior lower pelvic abscess abscess. After confirming needle tip position, aspiration was performed. A guidewire was advanced and the needle removed. The tract was dilated over the wire. A 10 French percutaneous drainage catheter was then advanced over the wire and formed. Catheter position was confirmed by CT. Additional fluid sample was aspirated from the drain. The drainage catheter was then flushed with saline and attached to a suction bulb. A Prolene retention suture was applied at the skin exit site. COMPLICATIONS: None FINDINGS: There was an anterior approach available to the level of the central lower abdominal sigmoid diverticular abscess. Aspiration yielded feculent material. Additional aspiration via a drainage catheter yielded purulent and feculent fluid. A 10 French drain was formed in the abscess cavity. Persistent posterior pelvic abscess was able to be accessed from a right transgluteal approach. Aspiration yielded feculent and purulent fluid. A 10 French drain was formed in the abscess. IMPRESSION: CT-guided percutaneous drainage of 2 separate peritoneal abscess collections related to ruptured sigmoid diverticulitis. A 10 French drain was placed from an anterior approach into the sigmoid diverticular abscess. A  second 10 French drain was placed from a posterior approach into the posterior deep pelvic abscess. Both drains were attached to suction bulbs. Electronically Signed   By: Irish Lack M.D.   On: 04/10/2019 16:41   Ct Image Guided Drainage By Percutaneous Catheter  Result Date: 04/10/2019 CLINICAL DATA:  Sigmoid diverticulitis with rupture and abscesses in the central upper pelvis and deep lower posterior pelvis. EXAM: 1. CT GUIDED CATHETER DRAINAGE OF PERITONEAL DIVERTICULAR ABSCESS 2. CT-GUIDED CATHETER DRAINAGE OF PELVIC PERITONEAL ABSCESS ANESTHESIA/SEDATION: 4.0 mg IV Versed 100 mcg IV Fentanyl Total Moderate Sedation Time:  52 minutes The patient's level of consciousness and physiologic status were continuously monitored during the procedure by Radiology nursing. PROCEDURE: The procedure, risks, benefits, and alternatives were explained to the patient. Questions regarding the procedure were encouraged and answered. The patient understands and consents to the procedure. A time out was performed prior to initiating the procedure. The anterior abdominal wall followed by the right transgluteal region were prepped with chlorhexidine in a sterile fashion, and a sterile drape was applied covering the operative field. A sterile gown and sterile gloves were used for the procedure. Local anesthesia was provided with 1% Lidocaine. An 18 gauge trocar needle was advanced from a right anterior approach to the level of a sigmoid diverticular abscess. After confirming needle tip position, aspiration was performed. A guidewire was advanced and the needle removed. The tract was dilated over the wire. A 10 French percutaneous drainage catheter was then advanced over the wire and formed. Catheter position was confirmed by CT. Additional fluid sample was aspirated from the drain. The drainage catheter was then  flushed with saline and attached to a suction bulb. A Prolene retention suture was applied at the skin exit site.  The patient was then placed in a prone position and from a right transgluteal approach, an 18 gauge trocar needle was advanced to the level a second posterior lower pelvic abscess abscess. After confirming needle tip position, aspiration was performed. A guidewire was advanced and the needle removed. The tract was dilated over the wire. A 10 French percutaneous drainage catheter was then advanced over the wire and formed. Catheter position was confirmed by CT. Additional fluid sample was aspirated from the drain. The drainage catheter was then flushed with saline and attached to a suction bulb. A Prolene retention suture was applied at the skin exit site. COMPLICATIONS: None FINDINGS: There was an anterior approach available to the level of the central lower abdominal sigmoid diverticular abscess. Aspiration yielded feculent material. Additional aspiration via a drainage catheter yielded purulent and feculent fluid. A 10 French drain was formed in the abscess cavity. Persistent posterior pelvic abscess was able to be accessed from a right transgluteal approach. Aspiration yielded feculent and purulent fluid. A 10 French drain was formed in the abscess. IMPRESSION: CT-guided percutaneous drainage of 2 separate peritoneal abscess collections related to ruptured sigmoid diverticulitis. A 10 French drain was placed from an anterior approach into the sigmoid diverticular abscess. A second 10 French drain was placed from a posterior approach into the posterior deep pelvic abscess. Both drains were attached to suction bulbs. Electronically Signed   By: Irish Lack M.D.   On: 04/10/2019 16:41   [place] Ekg Site Rite  Result Date: 04/10/2019 If Site Rite image not attached, placement could not be confirmed due to current cardiac rhythm.    Antimicrobials:   None currently   Subjective: Patient doing well sitting in bedside chair Ported received teaching on ostomy management  Objective: Vitals:   04/25/19  0510 04/25/19 0600 04/25/19 0859 04/25/19 1147  BP:   122/82   Pulse: (!) 110  (!) 107   Resp:  Temp:   97.7 F (36.5 C)   TempSrc:   Oral   SpO2: 99% 99% 100% 100%  Weight:      Height:        Intake/Output Summary (Last 24 hours) at 04/25/2019 1308 Last data filed at 04/25/2019 1237 Gross per 24 hour  Intake 2242.9 ml  Output 3150 ml  Net -907.1 ml   Filed Weights   04/16/19 1700 04/17/19 1050 04/24/19 1233  Weight: 79.7 kg 81.5 kg 80.7 kg    Examination:  General exam: Appears calm and comfortable  Respiratory system: Clear to auscultation. Respiratory effort normal. Cardiovascular system: S1 & S2 heard, RRR. No JVD, murmurs, rubs, gallops or clicks. No pedal edema. Gastrointestinal system: Ostomy left lower quadrant no evidence of bleeding no output currently, pink Central nervous system: Alert and oriented. No focal neurological deficits. Extremities: Warm well perfused, no edema. Skin: No rashes, lesions or ulcers, surgical site as above Psychiatry: Judgement and insight appear normal. Mood & affect appropriate.     Data Reviewed: I have personally reviewed following labs and imaging studies  CBC: Recent Labs  Lab 04/19/19 0412 04/20/19 0408 04/21/19 1039 04/22/19 0319 04/23/19 0339 04/24/19 0436  WBC 11.6* 11.3* 8.1 7.8 5.7 5.8  NEUTROABS 9.1* 8.5*  --   --  4.0 4.2  HGB 9.9* 8.6* 8.6* 8.5* 7.9* 7.9*  HCT 30.6* 27.0* 27.0* 27.1* 25.3* 25.2*  MCV 94.7  95.1 96.1 96.8 98.4 98.1  PLT 584* 532* 581* 615* 538* 513*   Basic Metabolic Panel: Recent Labs  Lab 04/19/19 0412  04/20/19 0408 04/21/19 1039 04/22/19 0319 04/23/19 0339 04/24/19 0436 04/25/19 0420  NA 134*   < > 137 143 144 146* 145 145  K 4.4   < > 4.0 3.7 3.6 3.5 3.9 3.7  CL 101   < > 101 105 104 108 113* 114*  CO2 25   < > 29 30 34* 29 24 23   GLUCOSE 128*   < > 116* 128* 118* 115* 118* 122*  BUN 22*   < > 27* 26* 36* 33* 28* 32*  CREATININE 0.61   < > 0.65 0.57 0.82 0.64 0.53  0.47  CALCIUM 8.3*   < > 8.2* 8.5* 8.6* 8.6* 8.7* 8.6*  MG 1.9  --  2.1 2.1  --  2.0 2.0  --   PHOS 3.3  --  4.5  --   --  3.9 3.4 3.4   < > = values in this interval not displayed.   GFR: Estimated Creatinine Clearance: 84.5 mL/min (by C-G formula based on SCr of 0.47 mg/dL). Liver Function Tests: Recent Labs  Lab 04/19/19 0412 04/19/19 1848 04/23/19 0339 04/24/19 0436  AST 21 17 20 22   ALT 45* 36 26 30  ALKPHOS 84 85 105 117  BILITOT 0.8 0.6 1.0 0.6  PROT 6.2* 6.0* 6.1* 6.3*  ALBUMIN 2.5* 2.5* 2.4* 2.5*   No results for input(s): LIPASE, AMYLASE in the last 168 hours. No results for input(s): AMMONIA in the last 168 hours. Coagulation Profile: No results for input(s): INR, PROTIME in the last 168 hours. Cardiac Enzymes: No results for input(s): CKTOTAL, CKMB, CKMBINDEX, TROPONINI in the last 168 hours. BNP (last 3 results) No results for input(s): PROBNP in the last 8760 hours. HbA1C: No results for input(s): HGBA1C in the last 72 hours. CBG: Recent Labs  Lab 04/22/19 0718 04/22/19 1116 04/22/19 1550 04/22/19 2127 04/23/19 0111  GLUCAP 102* 94 88 108* 89   Lipid Profile: Recent Labs    04/23/19 0339  TRIG 97   Thyroid Function Tests: No results for input(s): TSH, T4TOTAL, FREET4, T3FREE, THYROIDAB in the last 72 hours. Anemia Panel: No results for input(s): VITAMINB12, FOLATE, FERRITIN, TIBC, IRON, RETICCTPCT in the last 72 hours. Sepsis Labs: No results for input(s): PROCALCITON, LATICACIDVEN in the last 168 hours.  Recent Results (from the past 240 hour(s))  Novel Coronavirus, NAA (hospital order; send-out to ref lab)     Status: None   Collection Time: 04/18/19  8:45 AM   Specimen: Nasopharyngeal Swab; Respiratory  Result Value Ref Range Status   SARS-CoV-2, NAA NOT DETECTED NOT DETECTED Final    Comment: (NOTE) This test was developed and its performance characteristics determined by [facility]. This test has not been FDA cleared or  approved. This test has been authorized by FDA under an Emergency Use Authorization (EUA). This test is only authorized for the duration of time the declaration that circumstances exist justifying the authorization of the emergency use of in vitro diagnostic tests for detection of SARS-CoV-2 virus and/or diagnosis of COVID-19 infection under section 564(b)(1) of the Act, 21 U.S.C. 865HQI-6(N)(6360bbb-3(b)(1), unless the authorization is terminated or revoked sooner. When diagnostic testing is negative, the possibility of a false negative result should be considered in the context of a patient's recent exposures and the presence of clinical signs and symptoms consistent with COVID-19. An individual without symptoms of  COVID-19 and who is not shedding SARS-CoV-2 virus would expect to have a negative (not detected) result in this assay. Performed  At: [facility] [address] [place], [place] [id number] Jolene Schimke MD UU:[id number]    Coronavirus Source NASOPHARYNGEAL  Final    Comment: Performed at [facility], [address], [place], [place] [address]         Radiology Studies: No results found.      Scheduled Meds:  diphenhydrAMINE  25 mg Oral Once   enoxaparin (LOVENOX) injection  40 mg Subcutaneous Q24H   fluticasone  1 spray Each Nare Daily   HYDROmorphone   Intravenous Q4H   ketorolac  30 mg Intravenous Q8H   lip balm  1 application Topical BID   pantoprazole (PROTONIX) IV  40 mg Intravenous QHS   sodium chloride flush  10-40 mL Intracatheter Q12H   Continuous Infusions:  methocarbamol (ROBAXIN) IV 1,000 mg (04/25/19 1146)   TPN ADULT (ION) 75 mL/hr at 04/24/19 1732   TPN ADULT (ION)       LOS: 20 days    Time spent: 35 min    Burke Keels, MD [facility]  If 7PM-7AM, please contact night-coverage  04/25/2019, 1:08 PM

## 2019-04-25 NOTE — Progress Notes (Signed)
Physical Therapy Treatment Patient Details Name: Molly ReiningCarol T Frey MRN: [id number] DOB: 14-Nov-1965 Today's Date: 04/25/2019    History of Present Illness Pt admitted with Colitis/sepsis and is s/p R foot reconstructive surgery. s/p placement of 2 drains 04/10/19. She is now s/p EXPLORATORY LAPAROTOMY, HARTMAN'S PROCEDURE, SMALL BOWEL RESECTION and CYSTOSCOPY WITH STENT PLACEMENT (04/18/19)    PT Comments    Pt ambulated with her knee scooter in hallway.  Pt appeared very fatigued and anxious upon return to recliner.  RN present in room.  Pt would like PT to continue checking on her mobility.  Pt has been using a long sheet to wrap around abdomen for "pressure" to decrease pain with mobility.  Requested RN check on order for abdominal binder to better assist pt.   Follow Up Recommendations  No PT follow up     Equipment Recommendations  None recommended by PT    Recommendations for Other Services       Precautions / Restrictions Precautions Precautions: Fall Precaution Comments: NG tube Restrictions Weight Bearing Restrictions: Yes RLE Weight Bearing: Non weight bearing    Mobility  Bed Mobility               General bed mobility comments: pt up in recliner on arrival  Transfers Overall transfer level: Needs assistance Equipment used: None Transfers: Sit to/from Stand Sit to Stand: Min guard         General transfer comment: min/guard for safety, knee scooter in front of pt, pt uses armrests to self assist  Ambulation/Gait Ambulation/Gait assistance: Min guard Gait Distance (Feet): 400 Feet Assistive device: (knee scooter)       General Gait Details: pt utilized knee scooter from home, fatigues quickly and required 4 standing rest breaks, pt also with tremulous UEs (?anxiety, pain meds); elevated RR upon returning to room   Stairs             Wheelchair Mobility    Modified Rankin (Stroke Patients Only)       Balance                                            Cognition Arousal/Alertness: Awake/alert Behavior During Therapy: WFL for tasks assessed/performed;Anxious Overall Cognitive Status: Within Functional Limits for tasks assessed                                        Exercises      General Comments        Pertinent Vitals/Pain Pain Assessment: Faces Faces Pain Scale: Hurts even more Pain Location: abdomen Pain Descriptors / Indicators: Aching;Sore Pain Intervention(s): Repositioned;PCA encouraged;Monitored during session    Home Living                      Prior Function            PT Goals (current goals can now be found in the care plan section) Acute Rehab PT Goals PT Goal Formulation: With patient Time For Goal Achievement: 05/09/19 Potential to Achieve Goals: Good Progress towards PT goals: Progressing toward goals    Frequency    Min 3X/week      PT Plan Current plan remains appropriate    Co-evaluation  AM-PAC PT "6 Clicks" Mobility   Outcome Measure  Help needed turning from your back to your side while in a flat bed without using bedrails?: A Little Help needed moving from lying on your back to sitting on the side of a flat bed without using bedrails?: A Little Help needed moving to and from a bed to a chair (including a wheelchair)?: A Little Help needed standing up from a chair using your arms (e.g., wheelchair or bedside chair)?: A Little Help needed to walk in hospital room?: A Little Help needed climbing 3-5 steps with a railing? : A Lot 6 Click Score: 17    End of Session   Activity Tolerance: Patient tolerated treatment well Patient left: in chair;with nursing/sitter in room;with call bell/phone within reach   PT Visit Diagnosis: Difficulty in walking, not elsewhere classified (R26.2)     Time: 9179-1505 PT Time Calculation (min) (ACUTE ONLY): 18 min  Charges:  $Gait Training: 8-22 mins                     Carmelia Bake, PT, DPT Acute Rehabilitation Services Office: [phone] Pager: [phone]   Trena Platt 04/25/2019, 1:05 PM

## 2019-04-25 NOTE — Progress Notes (Signed)
PHARMACY - ADULT TOTAL PARENTERAL NUTRITION CONSULT NOTE   Pharmacy Consult for TPN Indication: intolerance to enteral feedings, sigmoid diverticulitis, SBO  Patient Measurements: Height: 5\' 4"  (162.6 cm) Weight: 177 lb 14.6 oz (80.7 kg) IBW/kg (Calculated) : 54.7 TPN AdjBW (KG): 61 Body mass index is 30.54 kg/m.  Current Nutrition: NPO, TPN  IVF: none  Central access:  PICC placed 6/9 TPN start date: 6/9  ASSESSMENT                                                                                                          HPI: 53 yo female with sigmoid diverticulitis with abscesses worsened since admission.  IR placed drain 6/9. Patient has been intolerant to enteral feedings for about a week so plan to place PICC line and start TPN per CCS.  Significant events:  6/17: ex lap with small bowel resection & stent placement  Today, 04/25/19  Glucose - at goal < 150, CBGs & SSI d/c'd 6/16; SBGs WNL  Electrolytes - WNL except Cl slightly elevated and; K trending down  Renal - SCr stable WNL, bicarb trending down, UOP adequate; BUN elevated (did receive decadron postop)  I/O - NG OP significantly increased yesterday, > 1L  LFTs - WNL  TGs - stable WNL  Prealbumin - low but improved, most recent 15.2  NUTRITIONAL GOALS                                                                                             RD recs: Kcal/day: 2000-2240, protein/day: 95-105g  Custom TPN to provide 99g protein per day and 1915 kcal/day at goal rate of 75 ml/hr (96% of estimated needs per RD)  PLAN                                                                                                                          KCl 10 mEq IV x 3  At 1800 today:  Continue custom TPN at goal rate of 75 ml/hr  No changes to electrolytes from yesterday; Cl:Ac = max Ac  Requiring max K in TPN just to maintain; will replete outside TPN  TPN to contain  standard multivitamins daily and trace elements on  MWF due to national shortage  IVF per MD (currently none)  TPN lab panels on Mondays & Thursdays  Bernadene Personrew Lupita Rosales, PharmD, BCPS [phone] 04/25/2019, 6:55 AM

## 2019-04-26 LAB — COMPREHENSIVE METABOLIC PANEL
ALT: 90 U/L — ABNORMAL HIGH (ref 0–44)
AST: 61 U/L — ABNORMAL HIGH (ref 15–41)
Albumin: 2.6 g/dL — ABNORMAL LOW (ref 3.5–5.0)
Alkaline Phosphatase: 145 U/L — ABNORMAL HIGH (ref 38–126)
Anion gap: 9 (ref 5–15)
BUN: 30 mg/dL — ABNORMAL HIGH (ref 6–20)
CO2: 24 mmol/L (ref 22–32)
Calcium: 8.6 mg/dL — ABNORMAL LOW (ref 8.9–10.3)
Chloride: 113 mmol/L — ABNORMAL HIGH (ref 98–111)
Creatinine, Ser: 0.54 mg/dL (ref 0.44–1.00)
GFR calc Af Amer: >60 mL/min (ref >60)
GFR calc non Af Amer: >60 mL/min (ref >60)
Glucose, Bld: 117 mg/dL — ABNORMAL HIGH (ref 70–99)
Potassium: 3.8 mmol/L (ref 3.5–5.1)
Sodium: 146 mmol/L — ABNORMAL HIGH (ref 135–145)
Total Bilirubin: 0.4 mg/dL (ref 0.3–1.2)
Total Protein: 6.3 g/dL — ABNORMAL LOW (ref 6.5–8.1)

## 2019-04-26 LAB — CBC WITH DIFFERENTIAL/PLATELET
Abs Immature Granulocytes: 0.03 10*3/uL (ref 0.00–0.07)
Basophils Absolute: 0.1 10*3/uL (ref 0.0–0.1)
Basophils Relative: 1 %
Eosinophils Absolute: 0.1 10*3/uL (ref 0.0–0.5)
Eosinophils Relative: 2 %
HCT: 26.1 % — ABNORMAL LOW (ref 36.0–46.0)
Hemoglobin: 8.5 g/dL — ABNORMAL LOW (ref 12.0–15.0)
Immature Granulocytes: 0 %
Lymphocytes Relative: 13 %
Lymphs Abs: 0.9 10*3/uL (ref 0.7–4.0)
MCH: 33.5 pg (ref 26.0–34.0)
MCHC: 32.6 g/dL (ref 30.0–36.0)
MCV: 102.8 fL — ABNORMAL HIGH (ref 80.0–100.0)
Monocytes Absolute: 0.4 10*3/uL (ref 0.1–1.0)
Monocytes Relative: 6 %
Neutro Abs: 5.4 10*3/uL (ref 1.7–7.7)
Neutrophils Relative %: 78 %
Platelets: 490 10*3/uL — ABNORMAL HIGH (ref 150–400)
RBC: 2.54 MIL/uL — ABNORMAL LOW (ref 3.87–5.11)
RDW: 14.1 % (ref 11.5–15.5)
WBC: 6.9 10*3/uL (ref 4.0–10.5)
nRBC: 0.3 % — ABNORMAL HIGH (ref 0.0–0.2)

## 2019-04-26 LAB — MAGNESIUM: Magnesium: 2 mg/dL (ref 1.7–2.4)

## 2019-04-26 LAB — PHOSPHORUS: Phosphorus: 3.9 mg/dL (ref 2.5–4.6)

## 2019-04-26 MED ORDER — HYDROMORPHONE HCL 1 MG/ML IJ SOLN
1.0000 mg | INTRAMUSCULAR | Status: DC | PRN
  Administered 2019-04-26 – 2019-04-28 (×5): 1 mg via INTRAVENOUS
  Filled 2019-04-26 (×6): qty 1

## 2019-04-26 MED ORDER — TRAVASOL 10 % IV SOLN
INTRAVENOUS | Status: AC
  Administered 2019-04-26: 18:00:00 via INTRAVENOUS
  Filled 2019-04-26: qty 990

## 2019-04-26 MED ORDER — POTASSIUM CHLORIDE 10 MEQ/50ML IV SOLN
10.0000 meq | INTRAVENOUS | Status: AC
  Administered 2019-04-26 (×3): 10 meq via INTRAVENOUS
  Filled 2019-04-26 (×3): qty 50

## 2019-04-26 NOTE — Progress Notes (Signed)
PHARMACY - ADULT TOTAL PARENTERAL NUTRITION CONSULT NOTE   Pharmacy Consult for TPN Indication: intolerance to enteral feedings, sigmoid diverticulitis, SBO  Patient Measurements: Height: 5\' 4"  (162.6 cm) Weight: 177 lb 14.6 oz (80.7 kg) IBW/kg (Calculated) : 54.7 TPN AdjBW (KG): 61 Body mass index is 30.54 kg/m.  Current Nutrition: NPO, TPN IVF: none Central access:  PICC placed 6/9 TPN start date: 6/9  ASSESSMENT                                                                                                          HPI: 53 yo female with sigmoid diverticulitis with abscesses worsened since admission.  IR placed drain 6/9. Patient has been intolerant to enteral feedings for about a week so plan to place PICC line and start TPN per CCS.  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  Today, 04/26/19  Glucose - at goal < 150, CBGs & SSI d/c'd 6/16; SBGs WNL  Electrolytes - K improved slightly with repletion yesterday, Na, Cl slightly elevated, others stable WNL  Renal - SCr stable WNL, bicarb trending up, UOP excellent; BUN elevated (did receive decadron postop)  I/O - NG output improved yesterday  LFTs - enzymes all slightly elevated, previously WNL  TGs - stable WNL  Prealbumin - low but improved, most recent 15.2  NUTRITIONAL GOALS                                                                                             RD recs: Kcal/day: 2000-2240, protein/day: 95-105g  Custom TPN to provide 99g protein per day and 1915 kcal/day at goal rate of 75 ml/hr (96% of estimated needs per RD)  PLAN                                                                                                                          Repeat KCl 10 mEq IV x 3  At 1800 today:  Continue custom TPN at goal rate of 75 ml/hr  Electrolytes: decrease Na, others unchanged; Cl:Ac = max Ac  Requiring max K in TPN just to maintain; will replete outside  TPN  TPN to contain standard multivitamins daily and trace elements on MWF due to national shortage  IVF per MD (currently none)  TPN lab panels on Mondays & Thursdays  CMET tomorrow  Bernadene Personrew Andrez Lieurance, PharmD, BCPS [phone] 04/26/2019, 7:55 AM

## 2019-04-26 NOTE — Progress Notes (Addendum)
8 Days Post-Op  Subjective: CC: Patient reports having small amount of flatus through ostomy as well as some stool. No nausea. 725 out of NG tube yesterday but appears clear and she notes she has been eatign lots of ice chips/water. Pain controlled but notes more pain around midline incision since yesterday. She still feels very anxious and is scared that she is getting depressed. Still having some dysuria and hematuria. Mobilizing and using IS.   Objective: Vital signs in last 24 hours: Temp:  [98.3 F (36.8 C)-99.1 F (37.3 C)] 98.3 F (36.8 C) (06/25 0851) Pulse Rate:  [99-130] 109 (06/25 0851) Resp:  [12-17] 17 (06/25 0851) BP: (117-127)/(82-87) 117/84 (06/25 0851) SpO2:  [93 %-100 %] 99 % (06/25 0851) FiO2 (%):  [100 %] 100 % (06/24 1549) Last BM Date: (ostomy placed 6/17; scant liquid output)  Intake/Output from previous day: 06/24 0701 - 06/25 0700 In: 2339.4 [P.O.:600; I.V.:1489.4; IV Piggyback:250] Out: 3430 [Urine:2700; Emesis/NG output:725; Drains:5] Intake/Output this shift: No intake/output data recorded.  PE: Gen: Awake and alert, NAD Heart: Tachycardic  Lungs: CTA b/l, normal effort and rate.  Abd: Soft, ND, tender around midline incision with surrounding cellulitis as seen below.Midline wound staples were removed x 8 and wound was opened. Cultures were obtained. Mainly bloody discharge ? some purulence tinged blood. Colostomy bag in place, stool at stoma site and small amount of air. More bowel sounds today. NG tube in place on LIWS.725cc recorded/24 hours, clear/yellow. This was clamped JP drain with SS fluid.5cc/24 hours. Tubing was stripped with some return following Msk: Right leg cast. Left lower leg without edema or calf tenderness. Skin: Rash on back appears to have resolved. No rash. Warm and dry.         Lab Results:  Recent Labs    04/24/19 0436  WBC 5.8  HGB 7.9*  HCT 25.2*  PLT 513*   BMET Recent Labs    04/25/19 0420  04/26/19 0336  NA 145 146*  K 3.7 3.8  CL 114* 113*  CO2 23 24  GLUCOSE 122* 117*  BUN 32* 30*  CREATININE 0.47 0.54  CALCIUM 8.6* 8.6*   PT/INR No results for input(s): LABPROT, INR in the last 72 hours. CMP     Component Value Date/Time   NA 146 (H) 04/26/2019 0336   K 3.8 04/26/2019 0336   CL 113 (H) 04/26/2019 0336   CO2 24 04/26/2019 0336   GLUCOSE 117 (H) 04/26/2019 0336   BUN 30 (H) 04/26/2019 0336   CREATININE 0.54 04/26/2019 0336   CALCIUM 8.6 (L) 04/26/2019 0336   PROT 6.3 (L) 04/26/2019 0336   ALBUMIN 2.6 (L) 04/26/2019 0336   AST 61 (H) 04/26/2019 0336   ALT 90 (H) 04/26/2019 0336   ALKPHOS 145 (H) 04/26/2019 0336   BILITOT 0.4 04/26/2019 0336   GFRNONAA >60 04/26/2019 0336   GFRAA >60 04/26/2019 0336   Lipase     Component Value Date/Time   LIPASE 25 09/12/2015 1615       Studies/Results: No results found.  Anti-infectives: Anti-infectives (From admission, onward)   Start     Dose/Rate Route Frequency Ordered Stop   04/23/19 0200  piperacillin-tazobactam (ZOSYN) IVPB 3.375 g     3.375 g 12.5 mL/hr over 240 Minutes Intravenous Every 8 hours 04/23/19 0144 04/23/19 0832   04/16/19 0600  cefoTEtan (CEFOTAN) 2 g in sodium chloride 0.9 % 100 mL IVPB     2 g 200 mL/hr over 30 Minutes Intravenous  On call to O.R. 04/15/19 1505 04/17/19 0559   04/05/19 0600  piperacillin-tazobactam (ZOSYN) IVPB 3.375 g  Status:  Discontinued     3.375 g 12.5 mL/hr over 240 Minutes Intravenous Every 8 hours 04/05/19 0121 04/23/19 0144   04/05/19 0000  piperacillin-tazobactam (ZOSYN) IVPB 3.375 g     3.375 g 100 mL/hr over 30 Minutes Intravenous  Once 04/04/19 2355 04/05/19 0119       Assessment/Plan  GERD Osteoarthritis - takes meloxicam and tramadol at baseline RecentR flatfoot reconstruction and lapidus bunion correctionsurgery by Dr. Hewitt5/26/2020- per note on 6/15 "patient is fine to stay in her post-op splint until she is D/C'd from the hospital" &  "stitches can stay in without causing any type of difficulty in regards to her wound healing" Protein Calorie Malnutrition - prealbumin <5(6/10) -> 8.3 (6/15) -> 12.3 (6/22), on TPN Anemia -Hgb 7.9& stable(monitor)  Tachycardia - HR ~100's since admission. EKG 6/23 with sinus tachycardia. No hypotension or hypoxia. Denies SOB.   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 Marcello Moores - 04/18/2019 - POD #8 - Cystoscopy with Stent placement - Dr. Matilde Sprang - 6/17 - Foley out, voiding on own. Ureteral stents per Urology. UA without definitive UTI. Urine cx ordered.  - Now will some bowel function. Clamp NG tube and sips of clears. Continue Reglan IV q6hrs prn to stimulate bowel function. This is to be given w/ Benadryl to decrease risk for Tardive Dyskinesia.  - Continue Dilaudid PCA, Toradol and Robaxin. Can add IV Tylenol if pain increases.  - Continue TPN for nutrition. Pre-albumin up to 12.3 (6/22). LFT's up this morning. Monitor - Continue surgical drain.  - Mobilizing more, continue to encourage. PT.Daughter brought scooter - Encourage IS use.  -Completed 5 day course of post-op Zosyn - Surgical pathology benign. Diverticular disease with transmural defect. One benign lymph node, serosal adhesions, margins histologically viable, no evidence of malignancy - Wound infection noted. This was opened and culture was sent down to lab. WTD BID.   FEN -Sips of clears, TPN, K 3.8  VTE -SCDs, Lovenox, Mobilize as able. ID -Completed 5 day course of post-op Zosyn(6/17 - 6/22), afebrile, will discuss with MD about abx for wound infection  Foley - Removed Follow-Up: Dr. Marcello Moores. Dr. Matilde Sprang (Urology)   LOS: 21 days    Jillyn Ledger , [facility] Surgery 04/26/2019, 9:55 AM Pager: [phone]

## 2019-04-26 NOTE — Progress Notes (Addendum)
PROGRESS NOTE    Molly ReiningCarol T Schipani  NFA:[id number]:[id number] DOB: December 11, 1965 DOA: 04/04/2019 PCP: Orbie HurstScifres, [name], [name]   Molly BilletCarol T Liberatoreis a 53 y.o.femalewith medical history significant ofGERD, elective surgery of her right foot a week ago presenting to the hospital for evaluation of abdominal pain.Patient states she had areconstructive right foot surgery done a week ago. States since then she has been constipated due to taking pain medications. Her last bowel movement was prior to the surgery. For the past few days she is having left lower quadrant abdominal pain which became worse for the past 24 hours. The pain is sharp and excruciating. States today she called her surgeon's office and was advised to take magnesium citrate for constipation. She vomited soon after taking magnesium citrate. Denies any fevers. She continues to complain of abdominal pain despite receiving a dose ofmorphine and multiple doses of fentanyl in the ED. CT abdomen pelvis showing acute sigmoid colitis with small foci of adjacent gas that may be extra luminal and may indicate microperforation. No abscess or drainable fluid collection. She was admitted for further treatment and care. General surgery was consulted.  **Interim History Patient with improved abdominal discomfort overnight following insertion of NG tube with significant output. Repeat imaging studies suggest continued small bowel obstruction. Intra-abdominal drains continue to drain well. Patient is continued on TPN because of lack of progression and because of concern for bilious drainage she underwent surgical intervention on 04/18/2019 and urology was consulted for bilateral stent placement and cystoscopy. General surgery did exploratory laparotomy with small bowel resection and Hartman's procedure along with urology doing a cystoscopy with bilateral stent placement. Today patient was in a lot of pain so surgery was admitted Dilaudid PCA. Patient  was complaining of significant pressure in her bladder felt that she had a urinary tract infection however it turned out that her catheter was blocked and she is re-cath and 1 L was removed.   Foley catheter was removed and pain medications are being adjusted and increased. Patient undergoing NG tube clamping and has been ambulating the halls. Better controlled with the PCA Dilaudid. Surgery recommending IV antibiotics for 5 days total Post-op after Surgical Intervention (Today). Will likely need to have her ureteral stenting removed eventually. Continue TNA nutrition for now until bowels are improved continue to mobilize. Her surgery can add IV Tylenol pain increased   Assessment & Plan:   Principal Problem:   Perforated sigmoid colon  Active Problems:   Colitis   Sepsis (HCC)   Hypokalemia   H/O foot surgery   Intraabdominal fluid collection   Encounter for imaging study to confirm nasogastric (NG) tube placement   Small bowel obstruction (HCC)   SBO (small bowel obstruction) (HCC)   Sepsis secondary to Acute Sigmoid Colitis with Microperforation complicated by 2 Absecesses status post drain placement and now exploratory laparotomy with Hartman's procedure a small bowel resection along with cystoscopy and bilateral stent placement SBO Sepsis, present on admission and physiology is improving -Patient presented to ED with progressive abdominal pain. CT abdomen/pelvis notable for acute sigmoid colitis with adjacent gas likely secondary to microperforation.  -Patient was initially started on IV Zosyn. During her initial hospitalization, her abdominal pain continued not to improve with resultant fevers and elevated white count. Repeat CT abdomen/pelvis reveals marked worsening in the pelvis with 2 abscesses. Interventional radiology was consulted and placed two drains on 04/10/2019.  -General surgery/IR saw in consultation -Wound culture 04/10/2019: Abundant mixed organisms, none  predominant -IV Zosynnow Discontinued by General  Surgery -NGT placement6/15/2020per general surgery clamping today 6/25 to determine if they can be removed,  -TPN per pharmacy -Continue to monitor drain output and BMs -Because of minimal improvement and now with green bile color in anterior abdominal drain, General Surgery did exploratory laparotomy and patient underwent a Hartman's procedure and small bowel resection; she is postoperative day 16 today -Urology Consulted by General Surgery for Bilateral Renal Stents and Cystoscopy; Foley catheter was replaced and now has been removed and will need her ureteral stenting removed in the outpatient setting -Last SARS-CoV-2 testing was negative on 04/05/2019 however this wasrepeated prior to Surgery -Currently getting TPN at 75 mL's per hour -KUB on 04/18/2019 showed "Improving small bowel obstruction. Contrast in the colon now reaches the rectum. Stable percutaneous drains in the pelvis."  -Continue supportive care and antiemetics -Pain control per general surgery  -Abx added today after mild perioperative wound erythema  Rash -Maculopapular rash bilateral distribution on her back, mod improvemernt.  -likley due to heat/sweat rash or contact from bedsheets.  -Continue IV Benadryl and hydrocortisone cream prn.  -Continue hygiene and change of bed sheets daily  Recent right foot surgery and Tendon Repair -Currently in a cast, plans to follow-up with her orthopedic surgeon as an outpatient -PT following -Continue nonweightbearing right lower extremity  Hypokalemia -K+ was 3.8this AM -Continue to Monitor- pt is on TPN -Repeat CMP in AM  Normocytic Anmeia -Patient's Hgb/Hct stable and is STABLE, WILL CONTINUE TO MONITOR -Anemia panel done and showed an iron level of 16, U IBC 189, TIBC of 205, saturation ratios of 8%, ferritin level 171, folate level 9.5, and vitamin B12 848  Hyperglycemia -Likely reactive and in the setting of  TPN; CBG's ranging from 100-118 -HbA1c was 4.9 on 04/14/2019 -Glucose this a.m. on CMP was 128 -CBG's ranging from88-108  GERD -Continue with PPI IV with Pantoprazole 40 mg q24h until able to take p.o.  Anxiety -Continue with Lorazepam 0.5 mg IV every 6 as needed for Anxiety  Obesity -Estimated body mass index is 30.84 kg/m as calculated from the following: -Weight Loss and Dietary Counseling given   Leukocytosis -In the setting of surgery and reactive -WBCimproved and now 5.8 -Continue to monitor for signs and symptoms of infection-none evident currently -Repeat CBC in a.m.  Abnormal ALT -mild bump, tb nl, will follow  Thrombocytosis  -Likely reactive, currently 513 and trending down -Monitor with CBC  Hypernatremia, improved -Patient sodium level this morning was 145 -In the setting of TPN administration -Continue monitor and trend and repeat sodium level in a.m.  DVT prophylaxis: Lovenox SQ  Code Status: Full    Code Status Orders  (From admission, onward)         Start     Ordered   04/05/19 0100  Full code  Continuous     04/05/19 0101        Code Status History    This patient has a current code status but no historical code status.   Advance Care Planning Activity     Family Communication: per primary Disposition Plan:   per primary Consults called: None Admission status: Inpatient   Consultants:   [facility], general surgery, orthopedic surgery, urology  Procedures:  Ct Abdomen Pelvis W Contrast  Result Date: 04/15/2019 CLINICAL DATA:  Severe lower abdominal pain. Unable to eat or drink. History of diverticulitis with recent abscess. Drain in place. EXAM: CT ABDOMEN AND PELVIS WITH CONTRAST TECHNIQUE: Multidetector CT imaging of the abdomen and pelvis was  performed using the standard protocol following bolus administration of intravenous contrast. CONTRAST:  100mL OMNIPAQUE IOHEXOL 300 MG/ML SOLN, 30mL OMNIPAQUE IOHEXOL  300 MG/ML SOLN COMPARISON:  Procedures CT 04/10/2019. CT of the abdomen and pelvis 04/09/2019 FINDINGS: Lower chest: The lung bases are clear without focal nodule, mass, or airspace disease. Small pericardial effusion is similar to prior studies. No pleural effusions are present. Hepatobiliary: No focal liver abnormality is seen. No gallstones, gallbladder wall thickening, or biliary dilatation. Distension of the gallbladder is likely secondary to fasting. Pancreas: Unremarkable. No pancreatic ductal dilatation or surrounding inflammatory changes. Spleen: Normal in size without focal abnormality. Adrenals/Urinary Tract: Adrenal glands are normal bilaterally. Kidneys and ureters are unremarkable. There is no nodule or mass lesion. No stone is present. There is no obstruction. Stomach/Bowel: The stomach is moderately distended and fluid filled. There is marked distension of proximal small bowel loops measuring up to 3.8 cm in transverse diameter. Transition point is in the anatomic pelvis adjacent to the more anterior and superior abscess. The distal small bowel is collapsed. Contrast is present in the colon. Gas and stool are present throughout the colon to the rectum. There is a small amount of contrast in the rectum. Inflammatory changes about the sigmoid colon are improving. Vascular/Lymphatic: No significant vascular calcifications are present. Subcentimeter lymph nodes are likely reactive. Reproductive: Uterus and bilateral adnexa are unremarkable. IUD is in place. Other: Anterior and posterior drainage catheters are stable in position. The anterior collection is collapsed. There is an adjacent collection which may be separate from the drain measuring 2.0 x 2.4 x 1.9 cm. The posterior drain is in place. Previously noted inferior pelvic collection is markedly reduced in size. This collection now measures 3.2 x 6.0 x 3.4 cm. There is still gas with a fluid collection. No other focal fluid collections are present.  Musculoskeletal: Vertebral body heights and alignment are maintained. No focal lytic or blastic lesions are present. Small Schmorl's nodes are present. Bony pelvis is within normal limits. The hips are located. Severe degenerative changes are again noted in the left hip. More mild degenerative changes are present on the right. IMPRESSION: 1. Dilated small bowel with a transition point in the anatomic pelvis adjacent to the inflammation is consistent with small bowel obstruction. No discrete bowel injury is evident. 2. The anterior abscess cavity has collapsed. 3. Adjacent small fluid collection measures 2.0 x 2.4 x 1.9 cm. 4. Marked reduction in size of the more inferior pelvic collection, now measuring 3.2 x 6.0 x 3.4 cm. Pigtail drainage catheter remains in place. 5. Reduced inflammatory changes surrounding the sigmoid colon. Electronically Signed   By: Marin Robertshristopher  Mattern M.D.   On: 04/15/2019 15:26   Ct Abdomen Pelvis W Contrast  Result Date: 04/09/2019 CLINICAL DATA:  Elevated white blood cell count and patient with abdominal pain for 1 week. EXAM: CT ABDOMEN AND PELVIS WITH CONTRAST TECHNIQUE: Multidetector CT imaging of the abdomen and pelvis was performed using the standard protocol following bolus administration of intravenous contrast. CONTRAST:  100 mL OMNIPAQUE IOHEXOL 300 MG/ML  SOLN COMPARISON:  CT abdomen pelvis 04/04/2019. FINDINGS: Lower chest: Heart size is normal. No pleural or pericardial effusion. Minimal right basilar atelectasis noted. Hepatobiliary: The gallbladder is distended but otherwise unremarkable. The liver and biliary tree appear normal. Pancreas: Unremarkable. No pancreatic ductal dilatation or surrounding inflammatory changes. Spleen: Normal in size without focal abnormality. Adrenals/Urinary Tract: Small cyst lower pole right kidney incidentally noted. The kidneys otherwise appear normal. Ureters and urinary bladder  are normal in appearance. Stomach/Bowel: Extensive  inflammatory change about the sigmoid colon has markedly worsened. There is an air and fluid collection posterior and to the right of the uterus which measures approximately 6.5 cm transverse by 9 cm AP by 5 cm craniocaudal consistent with an abscess. A second air and fluid collection consistent with abscess more superiorly in the central pelvis measures 4 cm AP by 3.5 cm transverse by 7.5 cm craniocaudal. This collection is contains predominantly gas and it overlies a loop of small bowel which appears inflamed. Presacral edema is identified. The walls of the sigmoid colon are markedly thickened inflamed. The colon otherwise appears normal. The stomach, small bowel and appendix are normal in appearance. Vascular/Lymphatic: No significant vascular findings are present. No enlarged abdominal or pelvic lymph nodes. Reproductive: Uterus and bilateral adnexa are unremarkable. IUD noted. Other: None. Musculoskeletal: No acute or focal bony abnormality. Severe left hip osteoarthritis noted. IMPRESSION: Marked worsening findings in the pelvis most consistent with acute sigmoid diverticulitis with 2 new abscesses identified as described above. These results were called by telephone at the time of interpretation on 04/09/2019 at 2:03 pm to [facility] Who verbally acknowledged these results. Electronically Signed   By: Drusilla Kanner M.D.   On: 04/09/2019 14:07   Ct Abdomen Pelvis W Contrast  Result Date: 04/04/2019 CLINICAL DATA:  Abdominal pain and decreased bowel movement since surgery EXAM: CT ABDOMEN AND PELVIS WITH CONTRAST TECHNIQUE: Multidetector CT imaging of the abdomen and pelvis was performed using the standard protocol following bolus administration of intravenous contrast. CONTRAST:  OMNIPAQUE IOHEXOL 300 MG/ML  SOLN COMPARISON:  None. FINDINGS: LOWER CHEST: There is no basilar pleural or apical pericardial effusion. HEPATOBILIARY: The hepatic contours and density are normal. There is no  intra- or extrahepatic biliary dilatation. The gallbladder is normal. PANCREAS: The pancreatic parenchymal contours are normal and there is no ductal dilatation. There is no peripancreatic fluid collection. SPLEEN: Normal. ADRENALS/URINARY TRACT: --Adrenal glands: Normal. --Right kidney/ureter: No hydronephrosis, nephroureterolithiasis, perinephric stranding or solid renal mass. --Left kidney/ureter: No hydronephrosis, nephroureterolithiasis, perinephric stranding or solid renal mass. --Urinary bladder: Normal for degree of distention STOMACH/BOWEL: --Stomach/Duodenum: There is no hiatal hernia or other gastric abnormality. The duodenal course and caliber are normal. --Small bowel: No dilatation or inflammation. --Colon: There is mild inflammation of the sigmoid colon. There are small foci of gas along the anterior contour of the distal sigmoid colon that may indicate microperforation (coronal images 43, 46, 47). Large amount of colonic stool. --Appendix: Not clearly visualized. VASCULAR/LYMPHATIC: Normal course and caliber of the major abdominal vessels. No abdominal or pelvic lymphadenopathy. REPRODUCTIVE: There is a T-shaped contraceptive device within the uterus. MUSCULOSKELETAL. No bony spinal canal stenosis or focal osseous abnormality. OTHER: None. IMPRESSION: Acute sigmoid colitis with small foci of adjacent gas that may be extraluminal. This may indicate micro perforation. No abscess or drainable fluid collection. Electronically Signed   By: Deatra Robinson M.D.   On: 04/04/2019 23:32   Dg Abd Portable 1v  Result Date: 04/18/2019 CLINICAL DATA:  Small bowel obstruction. EXAM: PORTABLE ABDOMEN - 1 VIEW COMPARISON:  April 17, 2019 FINDINGS: Two drains are seen in the pelvis, in stable position. Contrast is seen in the colon, extending into the rectum. Few mildly prominent loops of small bowel remain in the central abdomen measuring up to 3.3 cm. The more dilated loops superiorly is decreased in caliber. No  free air, portal venous gas, or pneumatosis. IMPRESSION: Improving small bowel obstruction. Contrast  in the colon now reaches the rectum. Stable percutaneous drains in the pelvis. Electronically Signed   By: Gerome Samavid  Williams III M.D   On: 04/18/2019 08:12   Dg Abd Portable 1v  Result Date: 04/17/2019 CLINICAL DATA:  Small bowel obstruction. EXAM: PORTABLE ABDOMEN - 1 VIEW COMPARISON:  One-view abdomen 04/16/2019 FINDINGS: Dilated loops of small bowel in the upper abdomen is similar the prior study. More distal loops demonstrate some decompression appear to the most recent x-ray. Surgical drains remain in place. No definite free air is present. Contrast is again noted in the colon. IMPRESSION: 1. Similar dilation of transverse loop of small bowel in the upper abdomen. 2. The remainder of the small bowel has begun to decompressed. 3. Drainage catheters are stable. Electronically Signed   By: Marin Robertshristopher  Mattern M.D.   On: 04/17/2019 10:38   Dg Abd Portable 1v-small Bowel Obstruction Protocol-initial, 8 Hr Delay  Result Date: 04/16/2019 CLINICAL DATA:  Small bowel obstruction. 8 hour delayed film. EXAM: PORTABLE ABDOMEN - 1 VIEW COMPARISON:  CT yesterday. FINDINGS: Enteric contrast in the colon, however unclear if this contrast was from prior CT or administered for small bowel protocol. There is residual contrast in the stomach. Persistent dilated small bowel in the central abdomen a 5.1 cm. Two drainage catheters overlie the pelvis. Advanced left hip osteoarthritis. IMPRESSION: Enteric contrast in the colon, however it is unclear if this is residual contrast that was seen on CT yesterday versus that administered for the small-bowel protocol. There is residual contrast in the stomach and persistent dilated small bowel in the central abdomen. Recommend 24 hour delayed film. Electronically Signed   By: Narda RutherfordMelanie  Sanford M.D.   On: 04/16/2019 23:43   Dg Abd Portable 1v-small Bowel Protocol-position  Verification  Result Date: 04/16/2019 CLINICAL DATA:  NG tube placement. EXAM: PORTABLE ABDOMEN - 1 VIEW COMPARISON:  Abdominal CT 04/15/2019 FINDINGS: Enteric catheter projects over the expected location of gastric cardia. Persistent small bowel loop dilation with maximum diameter of 5.4 cm. No free intra-abdominal gas is seen. IMPRESSION: 1. Enteric catheter projects over the expected location of gastric cardia. 2. Persistent small bowel obstruction. Electronically Signed   By: Ted Mcalpineobrinka  Dimitrova M.D.   On: 04/16/2019 13:20   Dg C-arm 1-60 Min-no Report  Result Date: 04/18/2019 Fluoroscopy was utilized by the requesting physician.  No radiographic interpretation.   Ct Image Guided Drainage By Percutaneous Catheter  Result Date: 04/10/2019 CLINICAL DATA:  Sigmoid diverticulitis with rupture and abscesses in the central upper pelvis and deep lower posterior pelvis. EXAM: 1. CT GUIDED CATHETER DRAINAGE OF PERITONEAL DIVERTICULAR ABSCESS 2. CT-GUIDED CATHETER DRAINAGE OF PELVIC PERITONEAL ABSCESS ANESTHESIA/SEDATION: 4.0 mg IV Versed 100 mcg IV Fentanyl Total Moderate Sedation Time:  52 minutes The patient's level of consciousness and physiologic status were continuously monitored during the procedure by Radiology nursing. PROCEDURE: The procedure, risks, benefits, and alternatives were explained to the patient. Questions regarding the procedure were encouraged and answered. The patient understands and consents to the procedure. A time out was performed prior to initiating the procedure. The anterior abdominal wall followed by the right transgluteal region were prepped with chlorhexidine in a sterile fashion, and a sterile drape was applied covering the operative field. A sterile gown and sterile gloves were used for the procedure. Local anesthesia was provided with 1% Lidocaine. An 18 gauge trocar needle was advanced from a right anterior approach to the level of a sigmoid diverticular abscess. After  confirming needle tip position, aspiration was performed.  A guidewire was advanced and the needle removed. The tract was dilated over the wire. A 10 French percutaneous drainage catheter was then advanced over the wire and formed. Catheter position was confirmed by CT. Additional fluid sample was aspirated from the drain. The drainage catheter was then flushed with saline and attached to a suction bulb. A Prolene retention suture was applied at the skin exit site. The patient was then placed in a prone position and from a right transgluteal approach, an 18 gauge trocar needle was advanced to the level a second posterior lower pelvic abscess abscess. After confirming needle tip position, aspiration was performed. A guidewire was advanced and the needle removed. The tract was dilated over the wire. A 10 French percutaneous drainage catheter was then advanced over the wire and formed. Catheter position was confirmed by CT. Additional fluid sample was aspirated from the drain. The drainage catheter was then flushed with saline and attached to a suction bulb. A Prolene retention suture was applied at the skin exit site. COMPLICATIONS: None FINDINGS: There was an anterior approach available to the level of the central lower abdominal sigmoid diverticular abscess. Aspiration yielded feculent material. Additional aspiration via a drainage catheter yielded purulent and feculent fluid. A 10 French drain was formed in the abscess cavity. Persistent posterior pelvic abscess was able to be accessed from a right transgluteal approach. Aspiration yielded feculent and purulent fluid. A 10 French drain was formed in the abscess. IMPRESSION: CT-guided percutaneous drainage of 2 separate peritoneal abscess collections related to ruptured sigmoid diverticulitis. A 10 French drain was placed from an anterior approach into the sigmoid diverticular abscess. A second 10 French drain was placed from a posterior approach into the posterior  deep pelvic abscess. Both drains were attached to suction bulbs. Electronically Signed   By: Irish Lack M.D.   On: 04/10/2019 16:41   Ct Image Guided Drainage By Percutaneous Catheter  Result Date: 04/10/2019 CLINICAL DATA:  Sigmoid diverticulitis with rupture and abscesses in the central upper pelvis and deep lower posterior pelvis. EXAM: 1. CT GUIDED CATHETER DRAINAGE OF PERITONEAL DIVERTICULAR ABSCESS 2. CT-GUIDED CATHETER DRAINAGE OF PELVIC PERITONEAL ABSCESS ANESTHESIA/SEDATION: 4.0 mg IV Versed 100 mcg IV Fentanyl Total Moderate Sedation Time:  52 minutes The patient's level of consciousness and physiologic status were continuously monitored during the procedure by Radiology nursing. PROCEDURE: The procedure, risks, benefits, and alternatives were explained to the patient. Questions regarding the procedure were encouraged and answered. The patient understands and consents to the procedure. A time out was performed prior to initiating the procedure. The anterior abdominal wall followed by the right transgluteal region were prepped with chlorhexidine in a sterile fashion, and a sterile drape was applied covering the operative field. A sterile gown and sterile gloves were used for the procedure. Local anesthesia was provided with 1% Lidocaine. An 18 gauge trocar needle was advanced from a right anterior approach to the level of a sigmoid diverticular abscess. After confirming needle tip position, aspiration was performed. A guidewire was advanced and the needle removed. The tract was dilated over the wire. A 10 French percutaneous drainage catheter was then advanced over the wire and formed. Catheter position was confirmed by CT. Additional fluid sample was aspirated from the drain. The drainage catheter was then flushed with saline and attached to a suction bulb. A Prolene retention suture was applied at the skin exit site. The patient was then placed in a prone position and from a right transgluteal  approach, an 74  gauge trocar needle was advanced to the level a second posterior lower pelvic abscess abscess. After confirming needle tip position, aspiration was performed. A guidewire was advanced and the needle removed. The tract was dilated over the wire. A 10 French percutaneous drainage catheter was then advanced over the wire and formed. Catheter position was confirmed by CT. Additional fluid sample was aspirated from the drain. The drainage catheter was then flushed with saline and attached to a suction bulb. A Prolene retention suture was applied at the skin exit site. COMPLICATIONS: None FINDINGS: There was an anterior approach available to the level of the central lower abdominal sigmoid diverticular abscess. Aspiration yielded feculent material. Additional aspiration via a drainage catheter yielded purulent and feculent fluid. A 10 French drain was formed in the abscess cavity. Persistent posterior pelvic abscess was able to be accessed from a right transgluteal approach. Aspiration yielded feculent and purulent fluid. A 10 French drain was formed in the abscess. IMPRESSION: CT-guided percutaneous drainage of 2 separate peritoneal abscess collections related to ruptured sigmoid diverticulitis. A 10 French drain was placed from an anterior approach into the sigmoid diverticular abscess. A second 10 French drain was placed from a posterior approach into the posterior deep pelvic abscess. Both drains were attached to suction bulbs. Electronically Signed   By: Irish Lack M.D.   On: 04/10/2019 16:41   [place] Ekg Site Rite  Result Date: 04/10/2019 If Site Rite image not attached, placement could not be confirmed due to current cardiac rhythm.    Antimicrobials:   None currently    Subjective: Patient doing well in bedside chair Surgery also here taken down wound to evaluate, staples removed Patient reported continued teaching on ostomy management  Objective: Vitals:   04/26/19 0829  04/26/19 0851 04/26/19 1150 04/26/19 1311  BP:  117/84  130/88  Pulse:  (!) 109  (!) 110  Resp: Temp:  98.3 F (36.8 C)  98.3 F (36.8 C)  TempSrc:  Oral  Oral  SpO2: 97% 99%  100%  Weight:      Height:        Intake/Output Summary (Last 24 hours) at 04/26/2019 1334 Last data filed at 04/26/2019 1051 Gross per 24 hour  Intake 1971.06 ml  Output 2880 ml  Net -908.94 ml   Filed Weights   04/16/19 1700 04/17/19 1050 04/24/19 1233  Weight: 79.7 kg 81.5 kg 80.7 kg    Examination:  General exam: Appears calm and comfortable  Respiratory system: Clear to auscultation. Respiratory effort normal. Cardiovascular system: S1 & S2 heard, RRR. No JVD, murmurs, rubs, gallops or clicks. No pedal edema. Gastrointestinal system: Ostomy left lower quadrant no evidence of bleeding no output currently, pink wound with mild erythema, please see surgery note and pictures Central nervous system: Alert and oriented. No focal neurological deficits. Extremities: Warm well perfused, no edema. Skin: No rashes, lesions or ulcers, surgical site as above Psychiatry: Judgement and insight appear normal. Mood & affect appropriate     Data Reviewed: I have personally reviewed following labs and imaging studies  CBC: Recent Labs  Lab 04/20/19 0408 04/21/19 1039 04/22/19 0319 04/23/19 0339 04/24/19 0436 04/26/19 1135  WBC 11.3* 8.1 7.8 5.7 5.8 6.9  NEUTROABS 8.5*  --   --  4.0 4.2 5.4  HGB 8.6* 8.6* 8.5* 7.9* 7.9* 8.5*  HCT 27.0* 27.0* 27.1* 25.3* 25.2* 26.1*  MCV 95.1 96.1 96.8 98.4 98.1 102.8*  PLT 532* 581* 615* 538* 513* 490*  Basic Metabolic Panel: Recent Labs  Lab 04/20/19 0408 04/21/19 1039 04/22/19 0319 04/23/19 0339 04/24/19 0436 04/25/19 0420 04/26/19 0336  NA 137 143 144 146* 145 145 146*  K 4.0 3.7 3.6 3.5 3.9 3.7 3.8  CL 101 105 104 108 113* 114* 113*  CO2 29 30 34* 29 24 23 24   GLUCOSE 116* 128* 118* 115* 118* 122* 117*  BUN 27* 26* 36* 33* 28* 32* 30*   CREATININE 0.65 0.57 0.82 0.64 0.53 0.47 0.54  CALCIUM 8.2* 8.5* 8.6* 8.6* 8.7* 8.6* 8.6*  MG 2.1 2.1  --  2.0 2.0  --  2.0  PHOS 4.5  --   --  3.9 3.4 3.4 3.9   GFR: Estimated Creatinine Clearance: 84.5 mL/min (by C-G formula based on SCr of 0.54 mg/dL). Liver Function Tests: Recent Labs  Lab 04/19/19 1848 04/23/19 0339 04/24/19 0436 04/26/19 0336  AST 17 20 22  61*  ALT 36 26 30 90*  ALKPHOS 85 105 117 145*  BILITOT 0.6 1.0 0.6 0.4  PROT 6.0* 6.1* 6.3* 6.3*  ALBUMIN 2.5* 2.4* 2.5* 2.6*   No results for input(s): LIPASE, AMYLASE in the last 168 hours. No results for input(s): AMMONIA in the last 168 hours. Coagulation Profile: No results for input(s): INR, PROTIME in the last 168 hours. Cardiac Enzymes: No results for input(s): CKTOTAL, CKMB, CKMBINDEX, TROPONINI in the last 168 hours. BNP (last 3 results) No results for input(s): PROBNP in the last 8760 hours. HbA1C: No results for input(s): HGBA1C in the last 72 hours. CBG: Recent Labs  Lab 04/22/19 0718 04/22/19 1116 04/22/19 1550 04/22/19 2127 04/23/19 0111  GLUCAP 102* 94 88 108* 89   Lipid Profile: No results for input(s): CHOL, HDL, LDLCALC, TRIG, CHOLHDL, LDLDIRECT in the last 72 hours. Thyroid Function Tests: No results for input(s): TSH, T4TOTAL, FREET4, T3FREE, THYROIDAB in the last 72 hours. Anemia Panel: No results for input(s): VITAMINB12, FOLATE, FERRITIN, TIBC, IRON, RETICCTPCT in the last 72 hours. Sepsis Labs: No results for input(s): PROCALCITON, LATICACIDVEN in the last 168 hours.  Recent Results (from the past 240 hour(s))  Novel Coronavirus, NAA (hospital order; send-out to ref lab)     Status: None   Collection Time: 04/18/19  8:45 AM   Specimen: Nasopharyngeal Swab; Respiratory  Result Value Ref Range Status   SARS-CoV-2, NAA NOT DETECTED NOT DETECTED Final    Comment: (NOTE) This test was developed and its performance characteristics determined by [employer]. This test has  not been FDA cleared or approved. This test has been authorized by FDA under an Emergency Use Authorization (EUA). This test is only authorized for the duration of time the declaration that circumstances exist justifying the authorization of the emergency use of in vitro diagnostic tests for detection of SARS-CoV-2 virus and/or diagnosis of COVID-19 infection under section 564(b)(1) of the Act, 21 U.S.C. 161WRU-0(A)(5), unless the authorization is terminated or revoked sooner. When diagnostic testing is negative, the possibility of a false negative result should be considered in the context of a patient's recent exposures and the presence of clinical signs and symptoms consistent with COVID-19. An individual without symptoms of COVID-19 and who is not shedding SARS-CoV-2 virus would expect to have a negative (not detected) result in this assay. Performed  At: [facility] [address] [place], [place] [id number] Jolene Schimke MD NW:[id number]    Coronavirus Source NASOPHARYNGEAL  Final    Comment: Performed at [facility], [address], [place], [place] [address]  Radiology Studies: No results found.      Scheduled Meds:  diphenhydrAMINE  25 mg Oral Once   enoxaparin (LOVENOX) injection  40 mg Subcutaneous Q24H   fluticasone  1 spray Each Nare Daily   HYDROmorphone   Intravenous Q4H   lip balm  1 application Topical BID   pantoprazole (PROTONIX) IV  40 mg Intravenous QHS   sodium chloride flush  10-40 mL Intracatheter Q12H   Continuous Infusions:  methocarbamol (ROBAXIN) IV 1,000 mg (04/26/19 0839)   TPN ADULT (ION) 75 mL/hr at 04/25/19 1818   TPN ADULT (ION)       LOS: 21 days    Time spent: 35 min    Burke Keels, MD [facility]  If 7PM-7AM, please contact night-coverage  04/26/2019, 1:34 PM

## 2019-04-27 LAB — COMPREHENSIVE METABOLIC PANEL
ALT: 91 U/L — ABNORMAL HIGH (ref 0–44)
AST: 47 U/L — ABNORMAL HIGH (ref 15–41)
Albumin: 2.8 g/dL — ABNORMAL LOW (ref 3.5–5.0)
Alkaline Phosphatase: 161 U/L — ABNORMAL HIGH (ref 38–126)
Anion gap: 8 (ref 5–15)
BUN: 29 mg/dL — ABNORMAL HIGH (ref 6–20)
CO2: 25 mmol/L (ref 22–32)
Calcium: 8.6 mg/dL — ABNORMAL LOW (ref 8.9–10.3)
Chloride: 110 mmol/L (ref 98–111)
Creatinine, Ser: 0.49 mg/dL (ref 0.44–1.00)
GFR calc Af Amer: >60 mL/min (ref >60)
GFR calc non Af Amer: >60 mL/min (ref >60)
Glucose, Bld: 109 mg/dL — ABNORMAL HIGH (ref 70–99)
Potassium: 3.6 mmol/L (ref 3.5–5.1)
Sodium: 143 mmol/L (ref 135–145)
Total Bilirubin: 0.7 mg/dL (ref 0.3–1.2)
Total Protein: 6.8 g/dL (ref 6.5–8.1)

## 2019-04-27 LAB — CBC
HCT: 28 % — ABNORMAL LOW (ref 36.0–46.0)
Hemoglobin: 8.6 g/dL — ABNORMAL LOW (ref 12.0–15.0)
MCH: 29.9 pg (ref 26.0–34.0)
MCHC: 30.7 g/dL (ref 30.0–36.0)
MCV: 97.2 fL (ref 80.0–100.0)
Platelets: 497 10*3/uL — ABNORMAL HIGH (ref 150–400)
RBC: 2.88 MIL/uL — ABNORMAL LOW (ref 3.87–5.11)
RDW: 14 % (ref 11.5–15.5)
WBC: 7.8 10*3/uL (ref 4.0–10.5)
nRBC: 0 % (ref 0.0–0.2)

## 2019-04-27 MED ORDER — DIPHENHYDRAMINE HCL 50 MG/ML IJ SOLN
25.0000 mg | Freq: Four times a day (QID) | INTRAMUSCULAR | Status: DC | PRN
  Administered 2019-04-27 – 2019-04-29 (×4): 25 mg via INTRAVENOUS
  Filled 2019-04-27 (×4): qty 1

## 2019-04-27 MED ORDER — PROMETHAZINE HCL 25 MG/ML IJ SOLN
12.5000 mg | Freq: Four times a day (QID) | INTRAMUSCULAR | Status: DC | PRN
  Administered 2019-04-27 – 2019-04-29 (×3): 25 mg via INTRAVENOUS
  Filled 2019-04-27 (×3): qty 1

## 2019-04-27 MED ORDER — TRAVASOL 10 % IV SOLN
INTRAVENOUS | Status: AC
  Administered 2019-04-27: 17:00:00 via INTRAVENOUS
  Filled 2019-04-27: qty 990

## 2019-04-27 MED ORDER — METOCLOPRAMIDE HCL 5 MG/ML IJ SOLN: 5.0000 mg | Freq: Four times a day (QID) | INTRAMUSCULAR | Status: DC | PRN

## 2019-04-27 MED ORDER — POTASSIUM CHLORIDE 10 MEQ/50ML IV SOLN
10.0000 meq | INTRAVENOUS | Status: AC
  Administered 2019-04-27 (×4): 10 meq via INTRAVENOUS
  Filled 2019-04-27 (×4): qty 50

## 2019-04-27 MED ORDER — ONDANSETRON HCL 4 MG/2ML IJ SOLN
4.0000 mg | Freq: Four times a day (QID) | INTRAMUSCULAR | Status: DC | PRN
  Administered 2019-04-27: 4 mg via INTRAVENOUS
  Filled 2019-04-27: qty 2

## 2019-04-27 NOTE — Progress Notes (Signed)
PROGRESS NOTE    Molly Beck  BJY:[id number] DOB: 06/19/1966 DOA: 04/04/2019 PCP: Clementeen Graham, [name]   Brief Narrative:  Molly Beck a 53 y.o.femalewith medical history significant ofGERD, elective surgery of her right foot a week ago presenting to the hospital for evaluation of abdominal pain.Patient states she had areconstructive right foot surgery done a week ago. States since then she has been constipated due to taking pain medications. Her last bowel movement was prior to the surgery. For the past few days she is having left lower quadrant abdominal pain which became worse for the past 24 hours. The pain is sharp and excruciating. States today she called her surgeon's office and was advised to take magnesium citrate for constipation. She vomited soon after taking magnesium citrate. Denies any fevers. She continues to complain of abdominal pain despite receiving a dose ofmorphine and multiple doses of fentanyl in the ED. CT abdomen pelvis showing acute sigmoid colitis with small foci of adjacent gas that may be extra luminal and may indicate microperforation. No abscess or drainable fluid collection. She was admitted for further treatment and care. General surgery was consulted.  **Interim History Patient with improved abdominal discomfort overnight following insertion of NG tube with significant output. Repeat imaging studies suggest continued small bowel obstruction. Intra-abdominal drains continue to drain well. Patient is continued on TPN because of lack of progression and because of concern for bilious drainage she underwent surgical intervention on 04/18/2019 and urology was consulted for bilateral stent placement and cystoscopy. General surgery did exploratory laparotomy with small bowel resection and Hartman's procedure along with urology doing a cystoscopy with bilateral stent placement. Today patient was in a lot of pain so surgery was admitted  Dilaudid PCA. Patient was complaining of significant pressure in her bladder felt that she had a urinary tract infection however it turned out that her catheter was blocked and she is re-cath and 1 L was removed.   Foley catheter was removed and pain medications are being adjusted and increased. Patient undergoing NG tube clamping and has been ambulating the halls. Better controlled with the PCA Dilaudid. Surgery recommending IV antibiotics for 5 days total Post-op after Surgical Intervention (Today). Will likely need to have her ureteral stenting removed eventually. Continue TNA nutrition for now until bowels are improved continue to mobilize.    Assessment & Plan:   Principal Problem:   Perforated sigmoid colon  Active Problems:   Colitis   Sepsis (HCC)   Hypokalemia   H/O foot surgery   Intraabdominal fluid collection   Encounter for imaging study to confirm nasogastric (NG) tube placement   Small bowel obstruction (HCC)   SBO (small bowel obstruction) (HCC)   Sepsis secondary to Acute Sigmoid Colitis with Microperforation complicated by 2 Absecesses status post drain placement and now exploratory laparotomy with Hartman's procedure a small bowel resection along with cystoscopy and bilateral stent placement SBO Sepsis, present on admission and physiology is improving -Patient presented to ED with progressive abdominal pain. CT abdomen/pelvis notable for acute sigmoid colitis with adjacent gas likely secondary to microperforation.  -Patient was initially started on IV Zosyn. During her initial hospitalization, her abdominal pain continued not to improve with resultant fevers and elevated white count. Repeat CT abdomen/pelvis reveals marked worsening in the pelvis with 2 abscesses. Interventional radiology was consulted and placed two drains on 04/10/2019.  -General surgery/IR saw in consultation -Wound culture 04/10/2019: Abundant mixed organisms, none predominant -IV Zosynnow  Discontinued by General Surgery -NGT placement6/15/2020per general  surgery clamping today 6/25 to determine if they can be removed,  -TPN per pharmacy -Continue to monitor drain output and BMs -Because of minimal improvement and now with green bile color in anterior abdominal drain, General Surgery did exploratory laparotomy and patient underwent a Hartman's procedure and small bowel resection; she is postoperative day 16 today -Urology Consulted by General Surgery for Bilateral Renal Stents and Cystoscopy; Foley catheter was replaced and now has been removed and will need her ureteral stenting removed in the outpatient setting -Last SARS-CoV-2 testing was negative on 04/05/2019 however this wasrepeated prior to Surgery -Currently getting TPN at 75 mL's per hour -KUB on 04/18/2019 showed "Improving small bowel obstruction. Contrast in the colon now reaches the rectum. Stable percutaneous drains in the pelvis."  -Continue supportive care and antiemetics -Pain control per general surgery   Rash -Maculopapular rash bilateral distribution on her back, mod improvemernt.  -likleydue to heat/sweat rash or contact from bedsheets.  -Continue IV Benadryl and hydrocortisone cream prn.  -Continue hygiene and change of bed sheets daily  Recent right foot surgery and Tendon Repair -Currently in a cast, plans to follow-up with her orthopedic surgeon as an outpatient -PT following -Continue nonweightbearing right lower extremity  Hypokalemia -K+ was 3.6this AM -Continue to Monitor- pt is on TPN -Repeat CMP in AM  Normocytic Anmeia -Patient's Hgb/Hct stable and isSTABLE, WILL CONTINUE TO MONITOR -Anemia panel done and showed an iron level of 16, U IBC 189, TIBC of 205, saturation ratios of 8%, ferritin level 171, folate level 9.5, and vitamin B12 848  Hyperglycemia -Likely reactive and in the setting of TPN; CBG's ranging from 100-118 -HbA1c was 4.9 on 04/14/2019 -Glucose this a.m. on CMP  was 128 -CBG's ranging from88-108  GERD -Continue with PPI IV with Pantoprazole 40 mg q24h until able to take p.o.  Anxiety -Continue with Lorazepam 0.5 mg IV every 6 as needed for Anxiety  Obesity -Estimated body mass index is 30.84 kg/m as calculated from the following: -Weight Loss and Dietary Counseling given   Leukocytosis -In the setting of surgery and reactive -WBCimproved and now 5.8 -Continue to monitor for signs and symptoms of infection-none evident currently -Repeat CBC in a.m.  Abnormal ALT -mild bump stable,, tb nl, reviewed prior history, will check hepatitis panel.  Thrombocytosis  -Likely reactive, currently 497 and trending down -Monitor with CBC  Hypernatremia, improved -Patient sodium level this morning was 143 -In the setting of TPN administration -Continue monitor and trend and repeat sodium level in a.m.  DVT prophylaxis: Lovenox SQ  Code Status: Full    Code Status Orders  (From admission, onward)         Start     Ordered   04/05/19 0100  Full code  Continuous     04/05/19 0101        Code Status History    This patient has a current code status but no historical code status.   Advance Care Planning Activity     Family Communication: per primary  Disposition Plan:   per primary Consults called: None Admission status: Inpatient   Consultants:   Interventional radiology, general surgery, orthopedic surgery, urology  Procedures:  Ct Abdomen Pelvis W Contrast  Result Date: 04/15/2019 CLINICAL DATA:  Severe lower abdominal pain. Unable to eat or drink. History of diverticulitis with recent abscess. Drain in place. EXAM: CT ABDOMEN AND PELVIS WITH CONTRAST TECHNIQUE: Multidetector CT imaging of the abdomen and pelvis was performed using the standard protocol following bolus  administration of intravenous contrast. CONTRAST:  OMNIPAQUE IOHEXOL 300 MG/ML SOLN, 30mL OMNIPAQUE IOHEXOL 300 MG/ML SOLN COMPARISON:   Procedures CT 04/10/2019. CT of the abdomen and pelvis 04/09/2019 FINDINGS: Lower chest: The lung bases are clear without focal nodule, mass, or airspace disease. Small pericardial effusion is similar to prior studies. No pleural effusions are present. Hepatobiliary: No focal liver abnormality is seen. No gallstones, gallbladder wall thickening, or biliary dilatation. Distension of the gallbladder is likely secondary to fasting. Pancreas: Unremarkable. No pancreatic ductal dilatation or surrounding inflammatory changes. Spleen: Normal in size without focal abnormality. Adrenals/Urinary Tract: Adrenal glands are normal bilaterally. Kidneys and ureters are unremarkable. There is no nodule or mass lesion. No stone is present. There is no obstruction. Stomach/Bowel: The stomach is moderately distended and fluid filled. There is marked distension of proximal small bowel loops measuring up to 3.8 cm in transverse diameter. Transition point is in the anatomic pelvis adjacent to the more anterior and superior abscess. The distal small bowel is collapsed. Contrast is present in the colon. Gas and stool are present throughout the colon to the rectum. There is a small amount of contrast in the rectum. Inflammatory changes about the sigmoid colon are improving. Vascular/Lymphatic: No significant vascular calcifications are present. Subcentimeter lymph nodes are likely reactive. Reproductive: Uterus and bilateral adnexa are unremarkable. IUD is in place. Other: Anterior and posterior drainage catheters are stable in position. The anterior collection is collapsed. There is an adjacent collection which may be separate from the drain measuring 2.0 x 2.4 x 1.9 cm. The posterior drain is in place. Previously noted inferior pelvic collection is markedly reduced in size. This collection now measures 3.2 x 6.0 x 3.4 cm. There is still gas with a fluid collection. No other focal fluid collections are present. Musculoskeletal: Vertebral  body heights and alignment are maintained. No focal lytic or blastic lesions are present. Small Schmorl's nodes are present. Bony pelvis is within normal limits. The hips are located. Severe degenerative changes are again noted in the left hip. More mild degenerative changes are present on the right. IMPRESSION: 1. Dilated small bowel with a transition point in the anatomic pelvis adjacent to the inflammation is consistent with small bowel obstruction. No discrete bowel injury is evident. 2. The anterior abscess cavity has collapsed. 3. Adjacent small fluid collection measures 2.0 x 2.4 x 1.9 cm. 4. Marked reduction in size of the more inferior pelvic collection, now measuring 3.2 x 6.0 x 3.4 cm. Pigtail drainage catheter remains in place. 5. Reduced inflammatory changes surrounding the sigmoid colon. Electronically Signed   By: Marin Roberts M.D.   On: 04/15/2019 15:26   Ct Abdomen Pelvis W Contrast  Result Date: 04/09/2019 CLINICAL DATA:  Elevated white blood cell count and patient with abdominal pain for 1 week. EXAM: CT ABDOMEN AND PELVIS WITH CONTRAST TECHNIQUE: Multidetector CT imaging of the abdomen and pelvis was performed using the standard protocol following bolus administration of intravenous contrast. CONTRAST:  100 mL OMNIPAQUE IOHEXOL 300 MG/ML  SOLN COMPARISON:  CT abdomen pelvis 04/04/2019. FINDINGS: Lower chest: Heart size is normal. No pleural or pericardial effusion. Minimal right basilar atelectasis noted. Hepatobiliary: The gallbladder is distended but otherwise unremarkable. The liver and biliary tree appear normal. Pancreas: Unremarkable. No pancreatic ductal dilatation or surrounding inflammatory changes. Spleen: Normal in size without focal abnormality. Adrenals/Urinary Tract: Small cyst lower pole right kidney incidentally noted. The kidneys otherwise appear normal. Ureters and urinary bladder are normal in appearance. Stomach/Bowel: Extensive inflammatory  change about the sigmoid  colon has markedly worsened. There is an air and fluid collection posterior and to the right of the uterus which measures approximately 6.5 cm transverse by 9 cm AP by 5 cm craniocaudal consistent with an abscess. A second air and fluid collection consistent with abscess more superiorly in the central pelvis measures 4 cm AP by 3.5 cm transverse by 7.5 cm craniocaudal. This collection is contains predominantly gas and it overlies a loop of small bowel which appears inflamed. Presacral edema is identified. The walls of the sigmoid colon are markedly thickened inflamed. The colon otherwise appears normal. The stomach, small bowel and appendix are normal in appearance. Vascular/Lymphatic: No significant vascular findings are present. No enlarged abdominal or pelvic lymph nodes. Reproductive: Uterus and bilateral adnexa are unremarkable. IUD noted. Other: None. Musculoskeletal: No acute or focal bony abnormality. Severe left hip osteoarthritis noted. IMPRESSION: Marked worsening findings in the pelvis most consistent with acute sigmoid diverticulitis with 2 new abscesses identified as described above. These results were called by telephone at the time of interpretation on 04/09/2019 at 2:03 pm to [facility] Who verbally acknowledged these results. Electronically Signed   By: Inge Rise M.D.   On: 04/09/2019 14:07   Ct Abdomen Pelvis W Contrast  Result Date: 04/04/2019 CLINICAL DATA:  Abdominal pain and decreased bowel movement since surgery EXAM: CT ABDOMEN AND PELVIS WITH CONTRAST TECHNIQUE: Multidetector CT imaging of the abdomen and pelvis was performed using the standard protocol following bolus administration of intravenous contrast. CONTRAST:  137mL OMNIPAQUE IOHEXOL 300 MG/ML  SOLN COMPARISON:  None. FINDINGS: LOWER CHEST: There is no basilar pleural or apical pericardial effusion. HEPATOBILIARY: The hepatic contours and density are normal. There is no intra- or extrahepatic biliary  dilatation. The gallbladder is normal. PANCREAS: The pancreatic parenchymal contours are normal and there is no ductal dilatation. There is no peripancreatic fluid collection. SPLEEN: Normal. ADRENALS/URINARY TRACT: --Adrenal glands: Normal. --Right kidney/ureter: No hydronephrosis, nephroureterolithiasis, perinephric stranding or solid renal mass. --Left kidney/ureter: No hydronephrosis, nephroureterolithiasis, perinephric stranding or solid renal mass. --Urinary bladder: Normal for degree of distention STOMACH/BOWEL: --Stomach/Duodenum: There is no hiatal hernia or other gastric abnormality. The duodenal course and caliber are normal. --Small bowel: No dilatation or inflammation. --Colon: There is mild inflammation of the sigmoid colon. There are small foci of gas along the anterior contour of the distal sigmoid colon that may indicate microperforation (coronal images 43, 46, 47). Large amount of colonic stool. --Appendix: Not clearly visualized. VASCULAR/LYMPHATIC: Normal course and caliber of the major abdominal vessels. No abdominal or pelvic lymphadenopathy. REPRODUCTIVE: There is a T-shaped contraceptive device within the uterus. MUSCULOSKELETAL. No bony spinal canal stenosis or focal osseous abnormality. OTHER: None. IMPRESSION: Acute sigmoid colitis with small foci of adjacent gas that may be extraluminal. This may indicate micro perforation. No abscess or drainable fluid collection. Electronically Signed   By: Ulyses Jarred M.D.   On: 04/04/2019 23:32   Dg Abd Portable 1v  Result Date: 04/18/2019 CLINICAL DATA:  Small bowel obstruction. EXAM: PORTABLE ABDOMEN - 1 VIEW COMPARISON:  April 17, 2019 FINDINGS: Two drains are seen in the pelvis, in stable position. Contrast is seen in the colon, extending into the rectum. Few mildly prominent loops of small bowel remain in the central abdomen measuring up to 3.3 cm. The more dilated loops superiorly is decreased in caliber. No free air, portal venous gas, or  pneumatosis. IMPRESSION: Improving small bowel obstruction. Contrast in the colon now reaches the rectum.  Stable percutaneous drains in the pelvis. Electronically Signed   By: Gerome Samavid  Williams III M.D   On: 04/18/2019 08:12   Dg Abd Portable 1v  Result Date: 04/17/2019 CLINICAL DATA:  Small bowel obstruction. EXAM: PORTABLE ABDOMEN - 1 VIEW COMPARISON:  One-view abdomen 04/16/2019 FINDINGS: Dilated loops of small bowel in the upper abdomen is similar the prior study. More distal loops demonstrate some decompression appear to the most recent x-ray. Surgical drains remain in place. No definite free air is present. Contrast is again noted in the colon. IMPRESSION: 1. Similar dilation of transverse loop of small bowel in the upper abdomen. 2. The remainder of the small bowel has begun to decompressed. 3. Drainage catheters are stable. Electronically Signed   By: Marin Robertshristopher  Mattern M.D.   On: 04/17/2019 10:38   Dg Abd Portable 1v-small Bowel Obstruction Protocol-initial, 8 Hr Delay  Result Date: 04/16/2019 CLINICAL DATA:  Small bowel obstruction. 8 hour delayed film. EXAM: PORTABLE ABDOMEN - 1 VIEW COMPARISON:  CT yesterday. FINDINGS: Enteric contrast in the colon, however unclear if this contrast was from prior CT or administered for small bowel protocol. There is residual contrast in the stomach. Persistent dilated small bowel in the central abdomen a 5.1 cm. Two drainage catheters overlie the pelvis. Advanced left hip osteoarthritis. IMPRESSION: Enteric contrast in the colon, however it is unclear if this is residual contrast that was seen on CT yesterday versus that administered for the small-bowel protocol. There is residual contrast in the stomach and persistent dilated small bowel in the central abdomen. Recommend 24 hour delayed film. Electronically Signed   By: Narda RutherfordMelanie  Sanford M.D.   On: 04/16/2019 23:43   Dg Abd Portable 1v-small Bowel Protocol-position Verification  Result Date:  04/16/2019 CLINICAL DATA:  NG tube placement. EXAM: PORTABLE ABDOMEN - 1 VIEW COMPARISON:  Abdominal CT 04/15/2019 FINDINGS: Enteric catheter projects over the expected location of gastric cardia. Persistent small bowel loop dilation with maximum diameter of 5.4 cm. No free intra-abdominal gas is seen. IMPRESSION: 1. Enteric catheter projects over the expected location of gastric cardia. 2. Persistent small bowel obstruction. Electronically Signed   By: Ted Mcalpineobrinka  Dimitrova M.D.   On: 04/16/2019 13:20   Dg C-arm 1-60 Min-no Report  Result Date: 04/18/2019 Fluoroscopy was utilized by the requesting physician.  No radiographic interpretation.   Ct Image Guided Drainage By Percutaneous Catheter  Result Date: 04/10/2019 CLINICAL DATA:  Sigmoid diverticulitis with rupture and abscesses in the central upper pelvis and deep lower posterior pelvis. EXAM: 1. CT GUIDED CATHETER DRAINAGE OF PERITONEAL DIVERTICULAR ABSCESS 2. CT-GUIDED CATHETER DRAINAGE OF PELVIC PERITONEAL ABSCESS ANESTHESIA/SEDATION: 4.0 mg IV Versed 100 mcg IV Fentanyl Total Moderate Sedation Time:  52 minutes The patient's level of consciousness and physiologic status were continuously monitored during the procedure by Radiology nursing. PROCEDURE: The procedure, risks, benefits, and alternatives were explained to the patient. Questions regarding the procedure were encouraged and answered. The patient understands and consents to the procedure. A time out was performed prior to initiating the procedure. The anterior abdominal wall followed by the right transgluteal region were prepped with chlorhexidine in a sterile fashion, and a sterile drape was applied covering the operative field. A sterile gown and sterile gloves were used for the procedure. Local anesthesia was provided with 1% Lidocaine. An 18 gauge trocar needle was advanced from a right anterior approach to the level of a sigmoid diverticular abscess. After confirming needle tip position,  aspiration was performed. A guidewire was advanced and the needle  removed. The tract was dilated over the wire. A 10 French percutaneous drainage catheter was then advanced over the wire and formed. Catheter position was confirmed by CT. Additional fluid sample was aspirated from the drain. The drainage catheter was then flushed with saline and attached to a suction bulb. A Prolene retention suture was applied at the skin exit site. The patient was then placed in a prone position and from a right transgluteal approach, an 18 gauge trocar needle was advanced to the level a second posterior lower pelvic abscess abscess. After confirming needle tip position, aspiration was performed. A guidewire was advanced and the needle removed. The tract was dilated over the wire. A 10 French percutaneous drainage catheter was then advanced over the wire and formed. Catheter position was confirmed by CT. Additional fluid sample was aspirated from the drain. The drainage catheter was then flushed with saline and attached to a suction bulb. A Prolene retention suture was applied at the skin exit site. COMPLICATIONS: None FINDINGS: There was an anterior approach available to the level of the central lower abdominal sigmoid diverticular abscess. Aspiration yielded feculent material. Additional aspiration via a drainage catheter yielded purulent and feculent fluid. A 10 French drain was formed in the abscess cavity. Persistent posterior pelvic abscess was able to be accessed from a right transgluteal approach. Aspiration yielded feculent and purulent fluid. A 10 French drain was formed in the abscess. IMPRESSION: CT-guided percutaneous drainage of 2 separate peritoneal abscess collections related to ruptured sigmoid diverticulitis. A 10 French drain was placed from an anterior approach into the sigmoid diverticular abscess. A second 10 French drain was placed from a posterior approach into the posterior deep pelvic abscess. Both drains  were attached to suction bulbs. Electronically Signed   By: Irish LackGlenn  Yamagata M.D.   On: 04/10/2019 16:41   Ct Image Guided Drainage By Percutaneous Catheter  Result Date: 04/10/2019 CLINICAL DATA:  Sigmoid diverticulitis with rupture and abscesses in the central upper pelvis and deep lower posterior pelvis. EXAM: 1. CT GUIDED CATHETER DRAINAGE OF PERITONEAL DIVERTICULAR ABSCESS 2. CT-GUIDED CATHETER DRAINAGE OF PELVIC PERITONEAL ABSCESS ANESTHESIA/SEDATION: 4.0 mg IV Versed 100 mcg IV Fentanyl Total Moderate Sedation Time:  52 minutes The patient's level of consciousness and physiologic status were continuously monitored during the procedure by Radiology nursing. PROCEDURE: The procedure, risks, benefits, and alternatives were explained to the patient. Questions regarding the procedure were encouraged and answered. The patient understands and consents to the procedure. A time out was performed prior to initiating the procedure. The anterior abdominal wall followed by the right transgluteal region were prepped with chlorhexidine in a sterile fashion, and a sterile drape was applied covering the operative field. A sterile gown and sterile gloves were used for the procedure. Local anesthesia was provided with 1% Lidocaine. An 18 gauge trocar needle was advanced from a right anterior approach to the level of a sigmoid diverticular abscess. After confirming needle tip position, aspiration was performed. A guidewire was advanced and the needle removed. The tract was dilated over the wire. A 10 French percutaneous drainage catheter was then advanced over the wire and formed. Catheter position was confirmed by CT. Additional fluid sample was aspirated from the drain. The drainage catheter was then flushed with saline and attached to a suction bulb. A Prolene retention suture was applied at the skin exit site. The patient was then placed in a prone position and from a right transgluteal approach, an 18 gauge trocar needle  was advanced to the  level a second posterior lower pelvic abscess abscess. After confirming needle tip position, aspiration was performed. A guidewire was advanced and the needle removed. The tract was dilated over the wire. A 10 French percutaneous drainage catheter was then advanced over the wire and formed. Catheter position was confirmed by CT. Additional fluid sample was aspirated from the drain. The drainage catheter was then flushed with saline and attached to a suction bulb. A Prolene retention suture was applied at the skin exit site. COMPLICATIONS: None FINDINGS: There was an anterior approach available to the level of the central lower abdominal sigmoid diverticular abscess. Aspiration yielded feculent material. Additional aspiration via a drainage catheter yielded purulent and feculent fluid. A 10 French drain was formed in the abscess cavity. Persistent posterior pelvic abscess was able to be accessed from a right transgluteal approach. Aspiration yielded feculent and purulent fluid. A 10 French drain was formed in the abscess. IMPRESSION: CT-guided percutaneous drainage of 2 separate peritoneal abscess collections related to ruptured sigmoid diverticulitis. A 10 French drain was placed from an anterior approach into the sigmoid diverticular abscess. A second 10 French drain was placed from a posterior approach into the posterior deep pelvic abscess. Both drains were attached to suction bulbs. Electronically Signed   By: Irish Lack M.D.   On: 04/10/2019 16:41   [place] Ekg Site Rite  Result Date: 04/10/2019 If Site Rite image not attached, placement could not be confirmed due to current cardiac rhythm.    Antimicrobials:   None currently    Subjective: Patient doing well, sitting in bedside chair today Reports being frustrated but a little more hopeful today  Objective: Vitals:   04/26/19 2044 04/27/19 0540 04/27/19 0813 04/27/19 1341  BP: 114/83 120/81  107/76  Pulse: (!) 122 (!)  129 (!) 105 (!) 114  Resp: Temp: 98.1 F (36.7 C) 98 F (36.7 C)  98.3 F (36.8 C)  TempSrc: Oral Oral  Oral  SpO2: 99% 99%  100%  Weight:      Height:        Intake/Output Summary (Last 24 hours) at 04/27/2019 1408 Last data filed at 04/27/2019 1407 Gross per 24 hour  Intake 2015.49 ml  Output 2345 ml  Net -329.51 ml   Filed Weights   04/16/19 1700 04/17/19 1050 04/24/19 1233  Weight: 79.7 kg 81.5 kg 80.7 kg    Examination:  General exam:Appears calm and comfortable  Respiratory system: Clear to auscultation. Respiratory effort normal. Cardiovascular system:S1 &S2 heard, RRR. No JVD, murmurs, rubs, gallops or clicks. No pedal edema. Gastrointestinal system:Ostomy left lower quadrant no evidence of bleeding good output currently, pink wound with mild erythema, please see surgery note and pictures Central nervous system:Alert and oriented. No focal neurological deficits. Extremities:Warm well perfused, no edema. Skin: No rashes, lesions or ulcers, surgical site as above Psychiatry:Judgement and insight appear normal. Mood &affect appropriate     Data Reviewed: I have personally reviewed following labs and imaging studies  CBC: Recent Labs  Lab 04/22/19 0319 04/23/19 0339 04/24/19 0436 04/26/19 1135 04/27/19 0403  WBC 7.8 5.7 5.8 6.9 7.8  NEUTROABS  --  4.0 4.2 5.4  --   HGB 8.5* 7.9* 7.9* 8.5* 8.6*  HCT 27.1* 25.3* 25.2* 26.1* 28.0*  MCV 96.8 98.4 98.1 102.8* 97.2  PLT 615* 538* 513* 490* 497*   Basic Metabolic Panel: Recent Labs  Lab 04/21/19 1039  04/23/19 0339 04/24/19 0436 04/25/19 0420 04/26/19 0336 04/27/19 0403  NA 143   < >  146* 145 145 146* 143  K 3.7   < > 3.5 3.9 3.7 3.8 3.6  CL 105   < > 108 113* 114* 113* 110  CO2 30   < > GLUCOSE 128*   < > 115* 118* 122* 117* 109*  BUN 26*   < > 33* 28* 32* 30* 29*  CREATININE 0.57   < > 0.64 0.53 0.47 0.54 0.49  CALCIUM 8.5*   < > 8.6* 8.7* 8.6* 8.6* 8.6*  MG 2.1   --  2.0 2.0  --  2.0  --   PHOS  --   --  3.9 3.4 3.4 3.9  --    < > = values in this interval not displayed.   GFR: Estimated Creatinine Clearance: 84.5 mL/min (by C-G formula based on SCr of 0.49 mg/dL). Liver Function Tests: Recent Labs  Lab 04/23/19 0339 04/24/19 0436 04/26/19 0336 04/27/19 0403  AST 20 22 61* 47*  ALT 26 30 90* 91*  ALKPHOS 105 117 145* 161*  BILITOT 1.0 0.6 0.4 0.7  PROT 6.1* 6.3* 6.3* 6.8  ALBUMIN 2.4* 2.5* 2.6* 2.8*   No results for input(s): LIPASE, AMYLASE in the last 168 hours. No results for input(s): AMMONIA in the last 168 hours. Coagulation Profile: No results for input(s): INR, PROTIME in the last 168 hours. Cardiac Enzymes: No results for input(s): CKTOTAL, CKMB, CKMBINDEX, TROPONINI in the last 168 hours. BNP (last 3 results) No results for input(s): PROBNP in the last 8760 hours. HbA1C: No results for input(s): HGBA1C in the last 72 hours. CBG: Recent Labs  Lab 04/22/19 0718 04/22/19 1116 04/22/19 1550 04/22/19 2127 04/23/19 0111  GLUCAP 102* 94 88 108* 89   Lipid Profile: No results for input(s): CHOL, HDL, LDLCALC, TRIG, CHOLHDL, LDLDIRECT in the last 72 hours. Thyroid Function Tests: No results for input(s): TSH, T4TOTAL, FREET4, T3FREE, THYROIDAB in the last 72 hours. Anemia Panel: No results for input(s): VITAMINB12, FOLATE, FERRITIN, TIBC, IRON, RETICCTPCT in the last 72 hours. Sepsis Labs: No results for input(s): PROCALCITON, LATICACIDVEN in the last 168 hours.  Recent Results (from the past 240 hour(s))  Novel Coronavirus, NAA (hospital order; send-out to ref lab)     Status: None   Collection Time: 04/18/19  8:45 AM   Specimen: Nasopharyngeal Swab; Respiratory  Result Value Ref Range Status   SARS-CoV-2, NAA NOT DETECTED NOT DETECTED Final    Comment: (NOTE) This test was developed and its performance characteristics determined by [employer]. This test has not been FDA cleared or approved. This test has  been authorized by FDA under an Emergency Use Authorization (EUA). This test is only authorized for the duration of time the declaration that circumstances exist justifying the authorization of the emergency use of in vitro diagnostic tests for detection of SARS-CoV-2 virus and/or diagnosis of COVID-19 infection under section 564(b)(1) of the Act, 21 U.S.C. 161WRU-0(A)(5), unless the authorization is terminated or revoked sooner. When diagnostic testing is negative, the possibility of a false negative result should be considered in the context of a patient's recent exposures and the presence of clinical signs and symptoms consistent with COVID-19. An individual without symptoms of COVID-19 and who is not shedding SARS-CoV-2 virus would expect to have a negative (not detected) result in this assay. Performed  At: [facility] [address] [place], [place] [id number] Jolene Schimke MD NW:[id number]    Coronavirus Source NASOPHARYNGEAL  Final    Comment: Performed at [facility], [address], [place], [place] [address]  Culture, Urine     Status: Abnormal (Preliminary result)   Collection Time: 04/26/19  9:45 AM   Specimen: Urine, Random  Result Value Ref Range Status   Specimen Description   Final    URINE, RANDOM Performed at [facility], [address], [place], [place] [address]    Special Requests   Final    NONE Performed at [facility], [address], [place], [place] [address]    Culture (A)  Final    40,000 COLONIES/mL ENTEROBACTER HORMAECHEI SUSCEPTIBILITIES TO FOLLOW Performed at [facility], [address], [place], [place] [address]    Report Status PENDING  Incomplete  Aerobic Culture (superficial specimen)     Status: None (Preliminary result)   Collection Time: 04/26/19 10:19 AM   Specimen: Wound  Result Value Ref Range Status   Specimen Description   Final    WOUND INCISION  ABD Performed at [facility], [address], [place], [place] [address]    Special Requests   Final    NONE Performed at [facility], [address], [place], [place] [address]    Gram Stain   Final    RARE WBC PRESENT, PREDOMINANTLY PMN NO ORGANISMS SEEN    Culture   Final    RARE ENTEROBACTER SPECIES SUSCEPTIBILITIES TO FOLLOW Performed at [facility], [address] [place], [place] [address]    Report Status PENDING  Incomplete         Radiology Studies: No results found.      Scheduled Meds:  diphenhydrAMINE  25 mg Oral Once   enoxaparin (LOVENOX) injection  40 mg Subcutaneous Q24H   fluticasone  1 spray Each Nare Daily   lip balm  1 application Topical BID   pantoprazole (PROTONIX) IV  40 mg Intravenous QHS   sodium chloride flush  10-40 mL Intracatheter Q12H   Continuous Infusions:  methocarbamol (ROBAXIN) IV 1,000 mg (04/27/19 0837)   potassium chloride 10 mEq (04/27/19 1331)   TPN ADULT (ION) 75 mL/hr at 04/27/19 0609   TPN ADULT (ION)       LOS: 22 days    Time spent: 35 min    Burke Keels, MD [facility]  If 7PM-7AM, please contact night-coverage  04/27/2019, 2:08 PM

## 2019-04-27 NOTE — Progress Notes (Signed)
PHARMACY - ADULT TOTAL PARENTERAL NUTRITION CONSULT NOTE   Pharmacy Consult for TPN Indication: intolerance to enteral feedings, sigmoid diverticulitis, SBO  Patient Measurements: Height: 5\' 4"  (162.6 cm) Weight: 177 lb 14.6 oz (80.7 kg) IBW/kg (Calculated) : 54.7 TPN AdjBW (KG): 61 Body mass index is 30.54 kg/m.  Current Nutrition: NPO, TPN IVF: none Central access:  PICC placed 6/9 TPN start date: 6/9  ASSESSMENT                                                                                                          HPI: 53 yo female with sigmoid diverticulitis with abscesses worsened since admission.  IR placed drain 6/9. Patient has been intolerant to enteral feedings for about a week so plan to place PICC line and start TPN per CCS.  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 6/26: cellulitis noted to midline incision; started on Zosyn  Today, 04/27/19  Glucose - at goal < 150, CBGs & SSI d/c'd 6/16; SBGs WNL  Electrolytes - K lower despite repletion yesterday, Na, Cl improved to WNL, others stable WNL  Renal - SCr, bicarb WNL; UOP adequate; BUN elevated but stable (did receive decadron postop)  I/O - NG output improved yesterday  LFTs - enzymes remain elevated but not clearly trending up, previously WNL  TGs - stable WNL  Prealbumin - low but improved, most recent 15.2  NUTRITIONAL GOALS                                                                                             RD recs (6/22): Kcal/day: [id number], protein/day: 95-105g  Custom TPN to provide 99g protein per day and 1915 kcal/day at goal rate of 75 ml/hr (96% of estimated needs per RD)  PLAN                                                                                                                          Repeat KCl 10 mEq IV x 4  At 1800 today:  Continue custom TPN at goal rate of 75 ml/hr  Electrolytes: unchanged from yesterday; Cl:Ac = max  Ac  Requiring max K in TPN just to maintain; will replete outside TPN  Note Zosyn provides a nontrivial amount of sodium, f/u for need to adjust Na in TPN  TPN to contain standard multivitamins daily and trace elements on MWF due to national shortage  IVF per MD (currently none)  TPN lab panels on Mondays & Thursdays  CMET, Mg tomorrow  Reuel Boom, PharmD, BCPS [phone] 04/27/2019, 6:49 AM

## 2019-04-27 NOTE — Progress Notes (Signed)
Physical Therapy Treatment Patient Details Name: Molly ReiningCarol T Beck MRN: [id number] DOB: 1966/01/18 Today's Date: 04/27/2019    History of Present Illness Pt admitted with Colitis/sepsis and is s/p R foot reconstructive surgery. s/p placement of 2 drains 04/10/19. She is now s/p EXPLORATORY LAPAROTOMY, HARTMAN'S PROCEDURE, SMALL BOWEL RESECTION and CYSTOSCOPY WITH STENT PLACEMENT (04/18/19)    PT Comments    Patient tearful with ambulation this session.  Seems to be frustrated with issues that keep slowing down her progress.  Encouraged her and noted she was able to tolerate hallway ambulation.  Feel she will continue to benefit from skilled PT in the acute setting prior to d/c home with family support.  Follow Up Recommendations  No PT follow up     Equipment Recommendations  None recommended by PT    Recommendations for Other Services       Precautions / Restrictions Precautions Precautions: Fall Precaution Comments: NG tube, colostomy, drains Required Braces or Orthoses: Other Brace Other Brace: abdominal binder Restrictions Weight Bearing Restrictions: Yes RLE Weight Bearing: Non weight bearing    Mobility  Bed Mobility Overal bed mobility: Needs Assistance   Rolling: Supervision Sidelying to sit: Supervision          Transfers   Equipment used: None Transfers: Sit to/from Stand Sit to Stand: Supervision            Ambulation/Gait Ambulation/Gait assistance: Min Emergency planning/management officerguard;Supervision Gait Distance (Feet): 150 Feet Assistive device: (knee scooter) Gait Pattern/deviations: Step-to pattern;Trunk flexed;Decreased stride length     General Gait Details: leaning over handles on scooter with pain, several stops to rest and work on Passenger transport managerimproving posture   Stairs             Wheelchair Mobility    Modified Rankin (Stroke Patients Only)       Balance Overall balance assessment: Needs assistance   Sitting balance-Leahy Scale: Good     Standing  balance support: Bilateral upper extremity supported Standing balance-Leahy Scale: Poor Standing balance comment: difficulty with functional tasks (handwashing placing elbows on sink) due to NWB status                            Cognition Arousal/Alertness: Awake/alert Behavior During Therapy: WFL for tasks assessed/performed Overall Cognitive Status: Within Functional Limits for tasks assessed                                        Exercises      General Comments        Pertinent Vitals/Pain Faces Pain Scale: Hurts even more Pain Location: abdomen Pain Descriptors / Indicators: Grimacing;Guarding(worse with ambulation) Pain Intervention(s): Monitored during session;Repositioned;Ice applied    Home Living                      Prior Function            PT Goals (current goals can now be found in the care plan section) Progress towards PT goals: Progressing toward goals    Frequency    Min 3X/week      PT Plan Current plan remains appropriate    Co-evaluation              AM-PAC PT "6 Clicks" Mobility   Outcome Measure  Help needed turning from your back to your side while in a flat bed  without using bedrails?: A Little Help needed moving from lying on your back to sitting on the side of a flat bed without using bedrails?: A Little Help needed moving to and from a bed to a chair (including a wheelchair)?: A Little Help needed standing up from a chair using your arms (e.g., wheelchair or bedside chair)?: A Little Help needed to walk in hospital room?: A Little Help needed climbing 3-5 steps with a railing? : Total 6 Click Score: 16    End of Session Equipment Utilized During Treatment: Other (comment)(abdominal binder) Activity Tolerance: Patient limited by fatigue;Patient limited by pain Patient left: with call bell/phone within reach;in chair   PT Visit Diagnosis: Difficulty in walking, not elsewhere classified  (R26.2)     Time: 5465-0354 PT Time Calculation (min) (ACUTE ONLY): 22 min  Charges:  $Gait Training: 8-22 mins                     Magda Kiel, [place] [phone] 04/27/2019    Reginia Naas 04/27/2019, 2:54 PM

## 2019-04-27 NOTE — Progress Notes (Signed)
9 Days Post-Op  Subjective: CC: Emesis Patient was left clamped overnight. Tolerated sips of clears until she woke up in the middle of the night and had a small episode of emesis. She was rehooked to [employer] by Education administratornursing staff. Reports nausea has improved since then. Does have hypersalivation. Notes abdominal pain is "rumbly" and feels as though she is going to pass gas and have a bowel movement. Has had continuous stool output since yesterday morning along with flatus from ostomy. Still feels anxious. Mobilizing. IS fell on floor yesterday, I gave her another one this morning and she will start using this.   Objective: Vital signs in last 24 hours: Temp:  [98 F (36.7 C)-98.3 F (36.8 C)] 98 F (36.7 C) (06/26 0540) Pulse Rate:  [90-129] 105 (06/26 0813) Resp:  [16-18] 16 (06/26 0540) BP: (114-130)/(81-88) 120/81 (06/26 0540) SpO2:  [98 %-100 %] 99 % (06/26 0540) FiO2 (%):  [98 %] 98 % (06/25 1150) Last BM Date: 04/27/19  Intake/Output from previous day: 06/25 0701 - 06/26 0700 In: 2843.9 [P.O.:360; I.V.:2071.9; IV Piggyback:412] Out: 2420 [Urine:1750; Emesis/NG output:650; Drains:20] Intake/Output this shift: Total I/O In: 0  Out: 525 [Urine:200; Emesis/NG output:300; Stool:25]  PE: Gen: Awake and alert, NAD Heart: Tachycardic  Lungs: CTA b/l, normal effort and rate.  Abd: Soft, ND, tender around midline incision with surrounding cellulitis as seen below.Midline wound open with healthy granulation tissue. Cellulitis resolving. Colostomy bag in place, with large amount of brown soft stool in bag along with air. Normoactive bowel sounds. NG tube in place on LIWS.650ccrecorded/24 hours, clear/yellow. JP drain with SS fluid.20cc/24 hours.. Msk: Right leg cast. Left lower leg without edema or calf tenderness. Skin: Rash on back appears to have resolved. No rash. Warm and dry.  Lab Results:  Recent Labs    04/26/19 1135 04/27/19 0403  WBC 6.9 7.8  HGB 8.5* 8.6*  HCT 26.1*  28.0*  PLT 490* 497*   BMET Recent Labs    04/26/19 0336 04/27/19 0403  NA 146* 143  K 3.8 3.6  CL 113* 110  CO2 24 25  GLUCOSE 117* 109*  BUN 30* 29*  CREATININE 0.54 0.49  CALCIUM 8.6* 8.6*   PT/INR No results for input(s): LABPROT, INR in the last 72 hours. CMP     Component Value Date/Time   NA 143 04/27/2019 0403   K 3.6 04/27/2019 0403   CL 110 04/27/2019 0403   CO2 25 04/27/2019 0403   GLUCOSE 109 (H) 04/27/2019 0403   BUN 29 (H) 04/27/2019 0403   CREATININE 0.49 04/27/2019 0403   CALCIUM 8.6 (L) 04/27/2019 0403   PROT 6.8 04/27/2019 0403   ALBUMIN 2.8 (L) 04/27/2019 0403   AST 47 (H) 04/27/2019 0403   ALT 91 (H) 04/27/2019 0403   ALKPHOS 161 (H) 04/27/2019 0403   BILITOT 0.7 04/27/2019 0403   GFRNONAA >60 04/27/2019 0403   GFRAA >60 04/27/2019 0403   Lipase     Component Value Date/Time   LIPASE 25 09/12/2015 1615       Studies/Results: No results found.  Anti-infectives: Anti-infectives (From admission, onward)   Start     Dose/Rate Route Frequency Ordered Stop   04/23/19 0200  piperacillin-tazobactam (ZOSYN) IVPB 3.375 g     3.375 g 12.5 mL/hr over 240 Minutes Intravenous Every 8 hours 04/23/19 0144 04/23/19 0832   04/16/19 0600  cefoTEtan (CEFOTAN) 2 g in sodium chloride 0.9 % 100 mL IVPB     2 g 200  mL/hr over 30 Minutes Intravenous On call to O.R. 04/15/19 1505 04/17/19 0559   04/05/19 0600  piperacillin-tazobactam (ZOSYN) IVPB 3.375 g  Status:  Discontinued     3.375 g 12.5 mL/hr over 240 Minutes Intravenous Every 8 hours 04/05/19 0121 04/23/19 0144   04/05/19 0000  piperacillin-tazobactam (ZOSYN) IVPB 3.375 g     3.375 g 100 mL/hr over 30 Minutes Intravenous  Once 04/04/19 2355 04/05/19 0119       Assessment/Plan GERD Osteoarthritis - takes meloxicam and tramadol at baseline RecentR flatfoot reconstruction and lapidus bunion correctionsurgery by Dr. Hewitt5/26/2020- per note on 6/15 "patient is fine to stay in her post-op  splint until she is D/C'd from the hospital" & "stitches can stay in without causing any type of difficulty in regards to her wound healing" Protein Calorie Malnutrition - prealbumin <5(6/10) -> 8.3 (6/15) -> 12.3 (6/22), on TPN Anemia -Hgb 8.6& stable(monitor)  Tachycardia -HR ~100's since admission. EKG 6/23 with sinus tachycardia. No hypotension or hypoxia. Denies SOB.No LLE calf tenderness or edema. Hgb stable.   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 Marcello Moores - 04/18/2019 - POD #9 - Cystoscopy with Stent placement - Dr. Matilde Sprang - 6/17 - Foley out, voiding on own. Ureteral stents per Urology.UA without definitive UTI. Urine cx w/ <100,000 colonies/ml. No indication of UTI. Defer to Urology - More bowel function overnight. Some nausea and emesis overnight. Will try to maximize nausea medication. Zofran PRN. Phenergan added. She also notes hypersalivation. Will give Benadryl for it's anticholinergic properties to help with hypersalivation. QT noted to be wnl on EKG from 6/23. Clamp NG tube and sips of clears today.  - Dilaudid PCA d/c'd. PRN dilaudid controlling pain. Oral meds when tolerating diet  - Continue TPN for nutrition. Pre-albumin up to 12.3 (6/22). LFT's still up this morning. Monitor. ? From TPN. No RUQ tenderness. GB w/out stones on prior CT's. GB distended on 6/8 & 6/14, although that was likely from being NPO. No imaging indicated currently.  - Continue surgical drain.  - Mobilizing more, continue to encourage. PT.Daughterbroughtscooter - Encourage IS use.  -Completed 5 day course of post-op Zosyn.  - Surgical pathology benign. Diverticular disease with transmural defect. One benign lymph node, serosal adhesions, margins histologically viable, no evidence of malignancy - Wound infection opened 6/25. WTD BID. Cellulitis improved. Cx w/out growth. No abx  indicated.   FEN -Sips of clears, NG tube clamped TPN, K 3.6 VTE -SCDs, Lovenox, Mobilize as able. ID -Completed 5 day course of post-op Zosyn(6/17 - 6/22), afebrile, WBC 7,800 Foley - Removed Follow-Up: Dr. Marcello Moores. Dr. Matilde Sprang (Urology)    LOS: 22 days    Molly Beck , [facility] 04/27/2019, 10:02 AM Pager: [phone]

## 2019-04-28 LAB — COMPREHENSIVE METABOLIC PANEL
ALT: 83 U/L — ABNORMAL HIGH (ref 0–44)
AST: 37 U/L (ref 15–41)
Albumin: 2.6 g/dL — ABNORMAL LOW (ref 3.5–5.0)
Alkaline Phosphatase: 149 U/L — ABNORMAL HIGH (ref 38–126)
Anion gap: 6 (ref 5–15)
BUN: 31 mg/dL — ABNORMAL HIGH (ref 6–20)
CO2: 26 mmol/L (ref 22–32)
Calcium: 8.5 mg/dL — ABNORMAL LOW (ref 8.9–10.3)
Chloride: 109 mmol/L (ref 98–111)
Creatinine, Ser: 0.54 mg/dL (ref 0.44–1.00)
GFR calc Af Amer: >60 mL/min (ref >60)
GFR calc non Af Amer: >60 mL/min (ref >60)
Glucose, Bld: 133 mg/dL — ABNORMAL HIGH (ref 70–99)
Potassium: 4.2 mmol/L (ref 3.5–5.1)
Sodium: 141 mmol/L (ref 135–145)
Total Bilirubin: 0.4 mg/dL (ref 0.3–1.2)
Total Protein: 6.4 g/dL — ABNORMAL LOW (ref 6.5–8.1)

## 2019-04-28 LAB — HEPATITIS PANEL, ACUTE
HCV Ab: <0.1 s/co ratio (ref 0.0–0.9)
Hep A IgM: NEGATIVE
Hep B C IgM: NEGATIVE
Hepatitis B Surface Ag: NEGATIVE

## 2019-04-28 LAB — AEROBIC CULTURE W GRAM STAIN (SUPERFICIAL SPECIMEN)

## 2019-04-28 LAB — URINE CULTURE: Culture: 40000 — AB

## 2019-04-28 LAB — MAGNESIUM: Magnesium: 2 mg/dL (ref 1.7–2.4)

## 2019-04-28 MED ORDER — HYDROMORPHONE HCL 1 MG/ML IJ SOLN
1.0000 mg | INTRAMUSCULAR | Status: DC | PRN
  Administered 2019-04-28 – 2019-04-29 (×3): 1 mg via INTRAVENOUS
  Filled 2019-04-28 (×4): qty 1

## 2019-04-28 MED ORDER — SODIUM CHLORIDE 0.9 % IV SOLN
2.0000 g | Freq: Three times a day (TID) | INTRAVENOUS | Status: DC
  Administered 2019-04-28 – 2019-04-29 (×2): 2 g via INTRAVENOUS
  Filled 2019-04-28 (×3): qty 2

## 2019-04-28 MED ORDER — GABAPENTIN 300 MG PO CAPS
300.0000 mg | ORAL_CAPSULE | Freq: Three times a day (TID) | ORAL | Status: DC
  Administered 2019-04-28 – 2019-05-02 (×13): 300 mg via ORAL
  Filled 2019-04-28 (×13): qty 1

## 2019-04-28 MED ORDER — ONDANSETRON HCL 4 MG/2ML IJ SOLN
4.0000 mg | Freq: Four times a day (QID) | INTRAMUSCULAR | Status: DC | PRN
  Administered 2019-04-29 – 2019-04-30 (×2): 4 mg via INTRAVENOUS
  Filled 2019-04-28 (×3): qty 2

## 2019-04-28 MED ORDER — LORAZEPAM 2 MG/ML IJ SOLN
0.5000 mg | Freq: Four times a day (QID) | INTRAMUSCULAR | Status: DC | PRN
  Administered 2019-04-28 (×2): 1 mg via INTRAVENOUS
  Filled 2019-04-28 (×2): qty 1

## 2019-04-28 MED ORDER — TRAMADOL HCL 50 MG PO TABS
50.0000 mg | ORAL_TABLET | Freq: Four times a day (QID) | ORAL | Status: DC | PRN
  Administered 2019-04-29: 50 mg via ORAL
  Filled 2019-04-28: qty 1

## 2019-04-28 MED ORDER — ACETAMINOPHEN 325 MG PO TABS
650.0000 mg | ORAL_TABLET | Freq: Four times a day (QID) | ORAL | Status: DC
  Administered 2019-04-28 – 2019-05-02 (×16): 650 mg via ORAL
  Filled 2019-04-28 (×16): qty 2

## 2019-04-28 MED ORDER — DIPHENHYDRAMINE HCL 50 MG/ML IJ SOLN
25.0000 mg | Freq: Once | INTRAMUSCULAR | Status: AC
  Administered 2019-04-28: 25 mg via INTRAVENOUS
  Filled 2019-04-28: qty 1

## 2019-04-28 MED ORDER — METOCLOPRAMIDE HCL 5 MG/ML IJ SOLN
10.0000 mg | Freq: Four times a day (QID) | INTRAMUSCULAR | Status: DC
  Administered 2019-04-28 – 2019-04-30 (×3): 10 mg via INTRAVENOUS
  Filled 2019-04-28 (×5): qty 2

## 2019-04-28 MED ORDER — TRAVASOL 10 % IV SOLN
INTRAVENOUS | Status: AC
  Administered 2019-04-28: 18:00:00 via INTRAVENOUS
  Filled 2019-04-28: qty 990

## 2019-04-28 NOTE — Progress Notes (Signed)
PHARMACY - ADULT TOTAL PARENTERAL NUTRITION CONSULT NOTE   Pharmacy Consult for TPN Indication: intolerance to enteral feedings, sigmoid diverticulitis, SBO  Patient Measurements: Height: 5\' 4"  (162.6 cm) Weight: 177 lb 14.6 oz (80.7 kg) IBW/kg (Calculated) : 54.7 TPN AdjBW (KG): 61 Body mass index is 30.54 kg/m.  Current Nutrition: NPO/sips of clears, TPN IVF: none Central access:  PICC placed 6/9 TPN start date: 6/9  ASSESSMENT                                                                                                          HPI: 53 yo female with sigmoid diverticulitis with abscesses worsened since admission.  IR placed drain 6/9. Patient has been intolerant to enteral feedings for about a week so plan to place PICC line and start TPN per CCS.  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 6/26: cellulitis noted to midline incision; started on Zosyn 6/27: plenty of stool in bag but still w/ nausea, re-clamping NG  Today, 04/28/19  Glucose - at goal < 150, CBGs & SSI d/c'd 6/16; SBGs WNL  Electrolytes - K improved to WNL, others stable WNL  Renal - SCr, bicarb WNL; UOP adequate; BUN elevated but stable (did receive decadron postop)  I/O - NG output worsened yesterday  LFTs - enzymes mildly elevated and improving  TGs - stable WNL  Prealbumin - low but improved, most recent 15.2  NUTRITIONAL GOALS                                                                                             RD recs (6/22): Kcal/day: [id number], protein/day: 95-105g  Custom TPN to provide 99g protein per day and 1915 kcal/day at goal rate of 75 ml/hr (96% of estimated needs per RD)  PLAN                                                                                                                          At 1800 today:  Continue custom TPN at goal rate of 75 ml/hr  Electrolytes: unchanged from yesterday; Cl:Ac = max Ac  Requiring max K in  TPN just to maintain; replete outside TPN if needed  TPN to contain standard multivitamins daily and trace elements on MWF due to national shortage  IVF per MD (currently none)  TPN lab panels on Mondays & Thursdays  Reuel Boom, PharmD, BCPS [phone] 04/28/2019, 7:31 AM

## 2019-04-28 NOTE — Progress Notes (Signed)
PROGRESS NOTE    Molly Beck  RUE:[id number] DOB: [birth year] DOA: 04/04/2019 PCP: Clementeen Graham, [name]   Brief Narrative:  Molly Beck a 53 y.o.femalewith medical history significant ofGERD, elective surgery of her right foot a week ago presenting to the hospital for evaluation of abdominal pain.Patient states she had areconstructive right foot surgery done a week ago. States since then she has been constipated due to taking pain medications. Her last bowel movement was prior to the surgery. For the past few days she is having left lower quadrant abdominal pain which became worse for the past 24 hours. The pain is sharp and excruciating. States today she called her surgeon's office and was advised to take magnesium citrate for constipation. She vomited soon after taking magnesium citrate. Denies any fevers. She continues to complain of abdominal pain despite receiving a dose ofmorphine and multiple doses of fentanyl in the ED. CT abdomen pelvis showing acute sigmoid colitis with small foci of adjacent gas that may be extra luminal and may indicate microperforation. No abscess or drainable fluid collection. She was admitted for further treatment and care. General surgery was consulted.  **Interim History Patient with improved abdominal discomfort overnight following insertion of NG tube with significant output. Repeat imaging studies suggest continued small bowel obstruction. Intra-abdominal drains continue to drain well. Patient is continued on TPN because of lack of progression and because of concern for bilious drainage she underwent surgical intervention on 04/18/2019 and urology was consulted for bilateral stent placement and cystoscopy. General surgery did exploratory laparotomy with small bowel resection and Hartman's procedure along with urology doing a cystoscopy with bilateral stent placement. Today patient was in a lot of pain so surgery was admitted  Dilaudid PCA. Patient was complaining of significant pressure in her bladder felt that she had a urinary tract infection however it turned out that her catheter was blocked and she is re-cath and 1 L was removed.   Foley catheter was removed and pain medications are being adjusted and increased. Patient undergoing NG tube clamping and has been ambulating the halls. Better controlled with the PCA Dilaudid. Surgery recommending IV antibiotics for 5 days total Post-op after Surgical Intervention (Today). Will likely need to have her ureteral stenting removed eventually. Continue TNA nutrition for now until bowels are improved continue to mobilize.    Assessment & Plan:   Principal Problem:   Perforated sigmoid colon  Active Problems:   Colitis   Sepsis (HCC)   Hypokalemia   H/O foot surgery   Intraabdominal fluid collection   Encounter for imaging study to confirm nasogastric (NG) tube placement   Small bowel obstruction (HCC)   SBO (small bowel obstruction) (HCC)    Sepsis secondary to Acute Sigmoid Colitis with Microperforation complicated by 2 Absecesses status post drain placement and now exploratory laparotomy with Hartman's procedure a small bowel resection along with cystoscopy and bilateral stent placement SBO Sepsis, present on admission and physiology is improving -Patient presentedto ED with progressive abdominal pain. CT abdomen/pelvis notable for acute sigmoid colitis with adjacent gas likely secondary to microperforation.  -Patient was initially started on IV Zosyn. During her initial hospitalization, her abdominal pain continued not to improve with resultant fevers and elevated white count. Repeat CT abdomen/pelvis reveals marked worsening in the pelvis with 2 abscesses. Interventional radiology was consulted and placed two drains on 04/10/2019.  -General surgery/IRsaw in consultation -Wound culture 04/10/2019: Abundant mixed organisms, none predominant -IV  Zosynnow Discontinued by General Surgery -NGT placement6/15/2020per general surgeryclamping  today 6/25 to determineif they can be removed, -TPN per pharmacy -Continue to monitor drain output and BMs -Because of minimal improvement with green bile color in anterior abdominal drain, General Surgerydidexploratory laparotomy and patient underwent a Hartman's procedure and small bowel resection; she is postoperative day 18today -Urology Consulted by General Surgery for Bilateral Renal Stents and Cystoscopy; Foley catheter was replaced and now has been removed and will need her ureteral stenting removed in the outpatient setting -Last SARS-CoV-2 testing was negative on 04/05/2019 however this wasrepeated prior to Surgery -KUB on 04/18/2019 showed "Improving small bowel obstruction. Contrast in the colon now reaches the rectum. Stable percutaneous drains in the pelvis."  -Continue supportive care and antiemetics -Pain control per general surgery  Rash -Maculopapular rash bilateral distribution on her back, mod improvemernt.  -likleydue to heat/sweat rash or contact from bedsheets.  -Continue IV Benadryl and hydrocortisone cream prn.  -Continue hygiene and change of bed sheets daily  Recent right foot surgery and Tendon Repair -Currently in a cast, plans to follow-up with her orthopedic surgeon as an outpatient -PT following -Continue nonweightbearing right lower extremity  Hypokalemia -K+ was 4.2this AM -Continue to Monitor- pt is on TPN -Repeat CMP in AM  Normocytic Anmeia -Patient's Hgb/Hct stable and isSTABLE, WILL CONTINUE TO MONITOR -Anemia panel done and showed an iron level of 16, U IBC 189, TIBC of 205, saturation ratios of 8%, ferritin level 171, folate level 9.5, and vitamin B12 848  Hyperglycemia -improved, Likely reactive and in the setting of TPN; CBG's ranging from 100-118 -HbA1c was 4.9 on 04/14/2019 -Glucose this a.m. on CMP was 128 -CBG's ranging  from88-108  GERD -Continue with PPI IV with Pantoprazole 40 mg q24h until able to take p.o.  Anxiety -Continue with Lorazepam 0.5 mg IV every 6 as needed for Anxiety  Obesity -Estimated body mass index is 30.84 kg/m as calculated from the following: -Weight Loss and Dietary Counseling given   Leukocytosis -In the setting of surgery and reactive -WBCimproved and now 7.8 -Continue to monitor for signs and symptoms of infection-none evident currently -Repeat CBC in a.m.  Abnormal ALT -mild bump stable,, tb nl, reviewed prior history, neg hepatitis panel.  Thrombocytosis  -Likely reactive, currently 497 and trending down -Monitor with CBC  Hypernatremia, improved -Patient sodium level this morning was 141 -In the setting of TPN administration -Continue monitor and trend and repeat sodium level in a.m.   DVT prophylaxis: Lovenox SQ  Code Status: full    Code Status Orders  (From admission, onward)         Start     Ordered   04/05/19 0100  Full code  Continuous     04/05/19 0101        Code Status History    This patient has a current code status but no historical code status.   Advance Care Planning Activity     Family Communication: per primary Disposition Plan:   per primary Consults called: None Admission status: Inpatient   Consultants:   Interventional radiology, general surgery, orthopedic surgery, urology.  Procedures:  Ct Abdomen Pelvis W Contrast  Result Date: 04/15/2019 CLINICAL DATA:  Severe lower abdominal pain. Unable to eat or drink. History of diverticulitis with recent abscess. Drain in place. EXAM: CT ABDOMEN AND PELVIS WITH CONTRAST TECHNIQUE: Multidetector CT imaging of the abdomen and pelvis was performed using the standard protocol following bolus administration of intravenous contrast. CONTRAST:  100mL OMNIPAQUE IOHEXOL 300 MG/ML SOLN, 30mL OMNIPAQUE IOHEXOL 300 MG/ML SOLN  COMPARISON:  Procedures CT 04/10/2019. CT of the  abdomen and pelvis 04/09/2019 FINDINGS: Lower chest: The lung bases are clear without focal nodule, mass, or airspace disease. Small pericardial effusion is similar to prior studies. No pleural effusions are present. Hepatobiliary: No focal liver abnormality is seen. No gallstones, gallbladder wall thickening, or biliary dilatation. Distension of the gallbladder is likely secondary to fasting. Pancreas: Unremarkable. No pancreatic ductal dilatation or surrounding inflammatory changes. Spleen: Normal in size without focal abnormality. Adrenals/Urinary Tract: Adrenal glands are normal bilaterally. Kidneys and ureters are unremarkable. There is no nodule or mass lesion. No stone is present. There is no obstruction. Stomach/Bowel: The stomach is moderately distended and fluid filled. There is marked distension of proximal small bowel loops measuring up to 3.8 cm in transverse diameter. Transition point is in the anatomic pelvis adjacent to the more anterior and superior abscess. The distal small bowel is collapsed. Contrast is present in the colon. Gas and stool are present throughout the colon to the rectum. There is a small amount of contrast in the rectum. Inflammatory changes about the sigmoid colon are improving. Vascular/Lymphatic: No significant vascular calcifications are present. Subcentimeter lymph nodes are likely reactive. Reproductive: Uterus and bilateral adnexa are unremarkable. IUD is in place. Other: Anterior and posterior drainage catheters are stable in position. The anterior collection is collapsed. There is an adjacent collection which may be separate from the drain measuring 2.0 x 2.4 x 1.9 cm. The posterior drain is in place. Previously noted inferior pelvic collection is markedly reduced in size. This collection now measures 3.2 x 6.0 x 3.4 cm. There is still gas with a fluid collection. No other focal fluid collections are present. Musculoskeletal: Vertebral body heights and alignment are  maintained. No focal lytic or blastic lesions are present. Small Schmorl's nodes are present. Bony pelvis is within normal limits. The hips are located. Severe degenerative changes are again noted in the left hip. More mild degenerative changes are present on the right. IMPRESSION: 1. Dilated small bowel with a transition point in the anatomic pelvis adjacent to the inflammation is consistent with small bowel obstruction. No discrete bowel injury is evident. 2. The anterior abscess cavity has collapsed. 3. Adjacent small fluid collection measures 2.0 x 2.4 x 1.9 cm. 4. Marked reduction in size of the more inferior pelvic collection, now measuring 3.2 x 6.0 x 3.4 cm. Pigtail drainage catheter remains in place. 5. Reduced inflammatory changes surrounding the sigmoid colon. Electronically Signed   By: Marin Roberts M.D.   On: 04/15/2019 15:26   Ct Abdomen Pelvis W Contrast  Result Date: 04/09/2019 CLINICAL DATA:  Elevated white blood cell count and patient with abdominal pain for 1 week. EXAM: CT ABDOMEN AND PELVIS WITH CONTRAST TECHNIQUE: Multidetector CT imaging of the abdomen and pelvis was performed using the standard protocol following bolus administration of intravenous contrast. CONTRAST:  100 mL OMNIPAQUE IOHEXOL 300 MG/ML  SOLN COMPARISON:  CT abdomen pelvis 04/04/2019. FINDINGS: Lower chest: Heart size is normal. No pleural or pericardial effusion. Minimal right basilar atelectasis noted. Hepatobiliary: The gallbladder is distended but otherwise unremarkable. The liver and biliary tree appear normal. Pancreas: Unremarkable. No pancreatic ductal dilatation or surrounding inflammatory changes. Spleen: Normal in size without focal abnormality. Adrenals/Urinary Tract: Small cyst lower pole right kidney incidentally noted. The kidneys otherwise appear normal. Ureters and urinary bladder are normal in appearance. Stomach/Bowel: Extensive inflammatory change about the sigmoid colon has markedly worsened.  There is an air and fluid collection posterior and  to the right of the uterus which measures approximately 6.5 cm transverse by 9 cm AP by 5 cm craniocaudal consistent with an abscess. A second air and fluid collection consistent with abscess more superiorly in the central pelvis measures 4 cm AP by 3.5 cm transverse by 7.5 cm craniocaudal. This collection is contains predominantly gas and it overlies a loop of small bowel which appears inflamed. Presacral edema is identified. The walls of the sigmoid colon are markedly thickened inflamed. The colon otherwise appears normal. The stomach, small bowel and appendix are normal in appearance. Vascular/Lymphatic: No significant vascular findings are present. No enlarged abdominal or pelvic lymph nodes. Reproductive: Uterus and bilateral adnexa are unremarkable. IUD noted. Other: None. Musculoskeletal: No acute or focal bony abnormality. Severe left hip osteoarthritis noted. IMPRESSION: Marked worsening findings in the pelvis most consistent with acute sigmoid diverticulitis with 2 new abscesses identified as described above. These results were called by telephone at the time of interpretation on 04/09/2019 at 2:03 pm to [facility] Who verbally acknowledged these results. Electronically Signed   By: Drusilla Kanner M.D.   On: 04/09/2019 14:07   Ct Abdomen Pelvis W Contrast  Result Date: 04/04/2019 CLINICAL DATA:  Abdominal pain and decreased bowel movement since surgery EXAM: CT ABDOMEN AND PELVIS WITH CONTRAST TECHNIQUE: Multidetector CT imaging of the abdomen and pelvis was performed using the standard protocol following bolus administration of intravenous contrast. CONTRAST:  OMNIPAQUE IOHEXOL 300 MG/ML  SOLN COMPARISON:  None. FINDINGS: LOWER CHEST: There is no basilar pleural or apical pericardial effusion. HEPATOBILIARY: The hepatic contours and density are normal. There is no intra- or extrahepatic biliary dilatation. The gallbladder is normal.  PANCREAS: The pancreatic parenchymal contours are normal and there is no ductal dilatation. There is no peripancreatic fluid collection. SPLEEN: Normal. ADRENALS/URINARY TRACT: --Adrenal glands: Normal. --Right kidney/ureter: No hydronephrosis, nephroureterolithiasis, perinephric stranding or solid renal mass. --Left kidney/ureter: No hydronephrosis, nephroureterolithiasis, perinephric stranding or solid renal mass. --Urinary bladder: Normal for degree of distention STOMACH/BOWEL: --Stomach/Duodenum: There is no hiatal hernia or other gastric abnormality. The duodenal course and caliber are normal. --Small bowel: No dilatation or inflammation. --Colon: There is mild inflammation of the sigmoid colon. There are small foci of gas along the anterior contour of the distal sigmoid colon that may indicate microperforation (coronal images 43, 46, 47). Large amount of colonic stool. --Appendix: Not clearly visualized. VASCULAR/LYMPHATIC: Normal course and caliber of the major abdominal vessels. No abdominal or pelvic lymphadenopathy. REPRODUCTIVE: There is a T-shaped contraceptive device within the uterus. MUSCULOSKELETAL. No bony spinal canal stenosis or focal osseous abnormality. OTHER: None. IMPRESSION: Acute sigmoid colitis with small foci of adjacent gas that may be extraluminal. This may indicate micro perforation. No abscess or drainable fluid collection. Electronically Signed   By: Deatra Robinson M.D.   On: 04/04/2019 23:32   Dg Abd Portable 1v  Result Date: 04/18/2019 CLINICAL DATA:  Small bowel obstruction. EXAM: PORTABLE ABDOMEN - 1 VIEW COMPARISON:  April 17, 2019 FINDINGS: Two drains are seen in the pelvis, in stable position. Contrast is seen in the colon, extending into the rectum. Few mildly prominent loops of small bowel remain in the central abdomen measuring up to 3.3 cm. The more dilated loops superiorly is decreased in caliber. No free air, portal venous gas, or pneumatosis. IMPRESSION: Improving  small bowel obstruction. Contrast in the colon now reaches the rectum. Stable percutaneous drains in the pelvis. Electronically Signed   By: Gerome Sam III M.D  On: 04/18/2019 08:12   Dg Abd Portable 1v  Result Date: 04/17/2019 CLINICAL DATA:  Small bowel obstruction. EXAM: PORTABLE ABDOMEN - 1 VIEW COMPARISON:  One-view abdomen 04/16/2019 FINDINGS: Dilated loops of small bowel in the upper abdomen is similar the prior study. More distal loops demonstrate some decompression appear to the most recent x-ray. Surgical drains remain in place. No definite free air is present. Contrast is again noted in the colon. IMPRESSION: 1. Similar dilation of transverse loop of small bowel in the upper abdomen. 2. The remainder of the small bowel has begun to decompressed. 3. Drainage catheters are stable. Electronically Signed   By: Marin Robertshristopher  Mattern M.D.   On: 04/17/2019 10:38   Dg Abd Portable 1v-small Bowel Obstruction Protocol-initial, 8 Hr Delay  Result Date: 04/16/2019 CLINICAL DATA:  Small bowel obstruction. 8 hour delayed film. EXAM: PORTABLE ABDOMEN - 1 VIEW COMPARISON:  CT yesterday. FINDINGS: Enteric contrast in the colon, however unclear if this contrast was from prior CT or administered for small bowel protocol. There is residual contrast in the stomach. Persistent dilated small bowel in the central abdomen a 5.1 cm. Two drainage catheters overlie the pelvis. Advanced left hip osteoarthritis. IMPRESSION: Enteric contrast in the colon, however it is unclear if this is residual contrast that was seen on CT yesterday versus that administered for the small-bowel protocol. There is residual contrast in the stomach and persistent dilated small bowel in the central abdomen. Recommend 24 hour delayed film. Electronically Signed   By: Narda RutherfordMelanie  Sanford M.D.   On: 04/16/2019 23:43   Dg Abd Portable 1v-small Bowel Protocol-position Verification  Result Date: 04/16/2019 CLINICAL DATA:  NG tube placement. EXAM:  PORTABLE ABDOMEN - 1 VIEW COMPARISON:  Abdominal CT 04/15/2019 FINDINGS: Enteric catheter projects over the expected location of gastric cardia. Persistent small bowel loop dilation with maximum diameter of 5.4 cm. No free intra-abdominal gas is seen. IMPRESSION: 1. Enteric catheter projects over the expected location of gastric cardia. 2. Persistent small bowel obstruction. Electronically Signed   By: Ted Mcalpineobrinka  Dimitrova M.D.   On: 04/16/2019 13:20   Dg C-arm 1-60 Min-no Report  Result Date: 04/18/2019 Fluoroscopy was utilized by the requesting physician.  No radiographic interpretation.   Ct Image Guided Drainage By Percutaneous Catheter  Result Date: 04/10/2019 CLINICAL DATA:  Sigmoid diverticulitis with rupture and abscesses in the central upper pelvis and deep lower posterior pelvis. EXAM: 1. CT GUIDED CATHETER DRAINAGE OF PERITONEAL DIVERTICULAR ABSCESS 2. CT-GUIDED CATHETER DRAINAGE OF PELVIC PERITONEAL ABSCESS ANESTHESIA/SEDATION: 4.0 mg IV Versed 100 mcg IV Fentanyl Total Moderate Sedation Time:  52 minutes The patient's level of consciousness and physiologic status were continuously monitored during the procedure by Radiology nursing. PROCEDURE: The procedure, risks, benefits, and alternatives were explained to the patient. Questions regarding the procedure were encouraged and answered. The patient understands and consents to the procedure. A time out was performed prior to initiating the procedure. The anterior abdominal wall followed by the right transgluteal region were prepped with chlorhexidine in a sterile fashion, and a sterile drape was applied covering the operative field. A sterile gown and sterile gloves were used for the procedure. Local anesthesia was provided with 1% Lidocaine. An 18 gauge trocar needle was advanced from a right anterior approach to the level of a sigmoid diverticular abscess. After confirming needle tip position, aspiration was performed. A guidewire was advanced and  the needle removed. The tract was dilated over the wire. A 10 French percutaneous drainage catheter was then advanced over  the wire and formed. Catheter position was confirmed by CT. Additional fluid sample was aspirated from the drain. The drainage catheter was then flushed with saline and attached to a suction bulb. A Prolene retention suture was applied at the skin exit site. The patient was then placed in a prone position and from a right transgluteal approach, an 18 gauge trocar needle was advanced to the level a second posterior lower pelvic abscess abscess. After confirming needle tip position, aspiration was performed. A guidewire was advanced and the needle removed. The tract was dilated over the wire. A 10 French percutaneous drainage catheter was then advanced over the wire and formed. Catheter position was confirmed by CT. Additional fluid sample was aspirated from the drain. The drainage catheter was then flushed with saline and attached to a suction bulb. A Prolene retention suture was applied at the skin exit site. COMPLICATIONS: None FINDINGS: There was an anterior approach available to the level of the central lower abdominal sigmoid diverticular abscess. Aspiration yielded feculent material. Additional aspiration via a drainage catheter yielded purulent and feculent fluid. A 10 French drain was formed in the abscess cavity. Persistent posterior pelvic abscess was able to be accessed from a right transgluteal approach. Aspiration yielded feculent and purulent fluid. A 10 French drain was formed in the abscess. IMPRESSION: CT-guided percutaneous drainage of 2 separate peritoneal abscess collections related to ruptured sigmoid diverticulitis. A 10 French drain was placed from an anterior approach into the sigmoid diverticular abscess. A second 10 French drain was placed from a posterior approach into the posterior deep pelvic abscess. Both drains were attached to suction bulbs. Electronically Signed    By: Irish Lack M.D.   On: 04/10/2019 16:41   Ct Image Guided Drainage By Percutaneous Catheter  Result Date: 04/10/2019 CLINICAL DATA:  Sigmoid diverticulitis with rupture and abscesses in the central upper pelvis and deep lower posterior pelvis. EXAM: 1. CT GUIDED CATHETER DRAINAGE OF PERITONEAL DIVERTICULAR ABSCESS 2. CT-GUIDED CATHETER DRAINAGE OF PELVIC PERITONEAL ABSCESS ANESTHESIA/SEDATION: 4.0 mg IV Versed 100 mcg IV Fentanyl Total Moderate Sedation Time:  52 minutes The patient's level of consciousness and physiologic status were continuously monitored during the procedure by Radiology nursing. PROCEDURE: The procedure, risks, benefits, and alternatives were explained to the patient. Questions regarding the procedure were encouraged and answered. The patient understands and consents to the procedure. A time out was performed prior to initiating the procedure. The anterior abdominal wall followed by the right transgluteal region were prepped with chlorhexidine in a sterile fashion, and a sterile drape was applied covering the operative field. A sterile gown and sterile gloves were used for the procedure. Local anesthesia was provided with 1% Lidocaine. An 18 gauge trocar needle was advanced from a right anterior approach to the level of a sigmoid diverticular abscess. After confirming needle tip position, aspiration was performed. A guidewire was advanced and the needle removed. The tract was dilated over the wire. A 10 French percutaneous drainage catheter was then advanced over the wire and formed. Catheter position was confirmed by CT. Additional fluid sample was aspirated from the drain. The drainage catheter was then flushed with saline and attached to a suction bulb. A Prolene retention suture was applied at the skin exit site. The patient was then placed in a prone position and from a right transgluteal approach, an 18 gauge trocar needle was advanced to the level a second posterior lower  pelvic abscess abscess. After confirming needle tip position, aspiration was performed. A guidewire  was advanced and the needle removed. The tract was dilated over the wire. A 10 French percutaneous drainage catheter was then advanced over the wire and formed. Catheter position was confirmed by CT. Additional fluid sample was aspirated from the drain. The drainage catheter was then flushed with saline and attached to a suction bulb. A Prolene retention suture was applied at the skin exit site. COMPLICATIONS: None FINDINGS: There was an anterior approach available to the level of the central lower abdominal sigmoid diverticular abscess. Aspiration yielded feculent material. Additional aspiration via a drainage catheter yielded purulent and feculent fluid. A 10 French drain was formed in the abscess cavity. Persistent posterior pelvic abscess was able to be accessed from a right transgluteal approach. Aspiration yielded feculent and purulent fluid. A 10 French drain was formed in the abscess. IMPRESSION: CT-guided percutaneous drainage of 2 separate peritoneal abscess collections related to ruptured sigmoid diverticulitis. A 10 French drain was placed from an anterior approach into the sigmoid diverticular abscess. A second 10 French drain was placed from a posterior approach into the posterior deep pelvic abscess. Both drains were attached to suction bulbs. Electronically Signed   By: Irish Lack M.D.   On: 04/10/2019 16:41   [place] Ekg Site Rite  Result Date: 04/10/2019 If Site Rite image not attached, placement could not be confirmed due to current cardiac rhythm.    Antimicrobials:   None currently    Subjective: Patient reports feeling much better today No acute events overnight Pain well controlled  Objective: Vitals:   04/28/19 0219 04/28/19 0540 04/28/19 1016 04/28/19 1323  BP: 114/81 109/78 116/71 104/74  Pulse: (!) 106 (!) 103 (!) 110 (!) 107  Resp: 16 16 16 18   Temp: 99.7 F (37.6 C)  98.8 F (37.1 C) 98.1 F (36.7 C) 98.2 F (36.8 C)  TempSrc: Oral Oral Oral Oral  SpO2: 99% 98% 100% 96%  Weight:      Height:        Intake/Output Summary (Last 24 hours) at 04/28/2019 1356 Last data filed at 04/28/2019 1323 Gross per 24 hour  Intake 1870.45 ml  Output 3825 ml  Net -1954.55 ml   Filed Weights   04/16/19 1700 04/17/19 1050 04/24/19 1233  Weight: 79.7 kg 81.5 kg 80.7 kg    Examination:  General exam:Appears calm and comfortable  Respiratory system: Clear to auscultation. Respiratory effort normal. Cardiovascular system:S1 &S2 heard, RRR. No JVD, murmurs, rubs, gallops or clicks. No pedal edema. Gastrointestinal system:Ostomy left lower quadrant no evidence of bleeding good output currently, pinkwound with mild erythema, please see surgery note and pictures, did not take dressing down Central nervous system:Alert and oriented. No focal neurological deficits. Extremities:Warm well perfused, no edema. Skin: No rashes, lesions or ulcers, surgical site as above Psychiatry:Judgement and insight appear normal. Mood &affect appropriate.     Data Reviewed: I have personally reviewed following labs and imaging studies  CBC: Recent Labs  Lab 04/22/19 0319 04/23/19 0339 04/24/19 0436 04/26/19 1135 04/27/19 0403  WBC 7.8 5.7 5.8 6.9 7.8  NEUTROABS  --  4.0 4.2 5.4  --   HGB 8.5* 7.9* 7.9* 8.5* 8.6*  HCT 27.1* 25.3* 25.2* 26.1* 28.0*  MCV 96.8 98.4 98.1 102.8* 97.2  PLT 615* 538* 513* 490* 497*   Basic Metabolic Panel: Recent Labs  Lab 04/23/19 0339 04/24/19 0436 04/25/19 0420 04/26/19 0336 04/27/19 0403 04/28/19 0415  NA 146* 145 145 146* 143 141  K 3.5 3.9 3.7 3.8 3.6 4.2  CL 108  113* 114* 113* 110 109  CO2 29 24 23 24 25 26   GLUCOSE 115* 118* 122* 117* 109* 133*  BUN 33* 28* 32* 30* 29* 31*  CREATININE 0.64 0.53 0.47 0.54 0.49 0.54  CALCIUM 8.6* 8.7* 8.6* 8.6* 8.6* 8.5*  MG 2.0 2.0  --  2.0  --  2.0  PHOS 3.9 3.4 3.4 3.9  --   --     GFR: Estimated Creatinine Clearance: 84.5 mL/min (by C-G formula based on SCr of 0.54 mg/dL). Liver Function Tests: Recent Labs  Lab 04/23/19 0339 04/24/19 0436 04/26/19 0336 04/27/19 0403 04/28/19 0415  AST 20 22 61* 47* 37  ALT 26 30 90* 91* 83*  ALKPHOS 105 117 145* 161* 149*  BILITOT 1.0 0.6 0.4 0.7 0.4  PROT 6.1* 6.3* 6.3* 6.8 6.4*  ALBUMIN 2.4* 2.5* 2.6* 2.8* 2.6*   No results for input(s): LIPASE, AMYLASE in the last 168 hours. No results for input(s): AMMONIA in the last 168 hours. Coagulation Profile: No results for input(s): INR, PROTIME in the last 168 hours. Cardiac Enzymes: No results for input(s): CKTOTAL, CKMB, CKMBINDEX, TROPONINI in the last 168 hours. BNP (last 3 results) No results for input(s): PROBNP in the last 8760 hours. HbA1C: No results for input(s): HGBA1C in the last 72 hours. CBG: Recent Labs  Lab 04/22/19 0718 04/22/19 1116 04/22/19 1550 04/22/19 2127 04/23/19 0111  GLUCAP 102* 94 88 108* 89   Lipid Profile: No results for input(s): CHOL, HDL, LDLCALC, TRIG, CHOLHDL, LDLDIRECT in the last 72 hours. Thyroid Function Tests: No results for input(s): TSH, T4TOTAL, FREET4, T3FREE, THYROIDAB in the last 72 hours. Anemia Panel: No results for input(s): VITAMINB12, FOLATE, FERRITIN, TIBC, IRON, RETICCTPCT in the last 72 hours. Sepsis Labs: No results for input(s): PROCALCITON, LATICACIDVEN in the last 168 hours.  Recent Results (from the past 240 hour(s))  Culture, Urine     Status: Abnormal   Collection Time: 04/26/19  9:45 AM   Specimen: Urine, Random  Result Value Ref Range Status   Specimen Description   Final    URINE, RANDOM Performed at [facility], [address] [place], [place] [address]    Special Requests   Final    NONE Performed at [facility], [address] [place], [place] [address]    Culture 40,000 COLONIES/mL ENTEROBACTER CLOACAE (A)  Final   Report Status 04/28/2019  FINAL  Final   Organism ID, Bacteria ENTEROBACTER CLOACAE (A)  Final      Susceptibility   Enterobacter cloacae - MIC*    CEFAZOLIN >=64 RESISTANT Resistant     CEFTRIAXONE >=64 RESISTANT Resistant     CIPROFLOXACIN <=0.25 SENSITIVE Sensitive     GENTAMICIN <=1 SENSITIVE Sensitive     IMIPENEM <=0.25 SENSITIVE Sensitive     NITROFURANTOIN 64 INTERMEDIATE Intermediate     TRIMETH/SULFA <=20 SENSITIVE Sensitive     PIP/TAZO >=128 RESISTANT Resistant     * 40,000 COLONIES/mL ENTEROBACTER CLOACAE  Aerobic Culture (superficial specimen)     Status: None   Collection Time: 04/26/19 10:19 AM   Specimen: Wound  Result Value Ref Range Status   Specimen Description   Final    WOUND INCISION ABD Performed at [facility], [address] [place], [place] [address]    Special Requests   Final    NONE Performed at [facility], [address] [place], [place] [address]    Gram Stain   Final  RARE WBC PRESENT, PREDOMINANTLY PMN NO ORGANISMS SEEN Performed at [facility] [address], [place], [place] [address]    Culture RARE ENTEROBACTER CLOACAE  Final   Report Status 04/28/2019 FINAL  Final   Organism ID, Bacteria ENTEROBACTER CLOACAE  Final      Susceptibility   Enterobacter cloacae - MIC*    CEFAZOLIN >=64 RESISTANT Resistant     CEFEPIME 2 SENSITIVE Sensitive     CEFTAZIDIME >=64 RESISTANT Resistant     CEFTRIAXONE >=64 RESISTANT Resistant     CIPROFLOXACIN <=0.25 SENSITIVE Sensitive     GENTAMICIN <=1 SENSITIVE Sensitive     IMIPENEM <=0.25 SENSITIVE Sensitive     TRIMETH/SULFA <=20 SENSITIVE Sensitive     PIP/TAZO >=128 RESISTANT Resistant     * RARE ENTEROBACTER CLOACAE         Radiology Studies: No results found.      Scheduled Meds:  acetaminophen  650 mg Oral Q6H   enoxaparin (LOVENOX) injection  40 mg Subcutaneous Q24H   fluticasone  1 spray Each Nare Daily   gabapentin  300 mg Oral TID   lip balm  1  application Topical BID   metoCLOPramide (REGLAN) injection  10 mg Intravenous Q6H   pantoprazole (PROTONIX) IV  40 mg Intravenous QHS   sodium chloride flush  10-40 mL Intracatheter Q12H   Continuous Infusions:  methocarbamol (ROBAXIN) IV 1,000 mg (04/28/19 0947)   TPN ADULT (ION) 75 mL/hr at 04/27/19 1721   TPN ADULT (ION)       LOS: 23 days    Time spent: 35 min    Nicolette Bang, MD [facility]  If 7PM-7AM, please contact night-coverage  04/28/2019, 1:56 PM

## 2019-04-28 NOTE — Progress Notes (Signed)
10 Days Post-Op  Subjective: CC: Emesis Continues to have issues with nausea.  NG tube has been clamped this morning.  Ostomy is working.  Objective: Vital signs in last 24 hours: Temp:  [98.3 F (36.8 C)-99.7 F (37.6 C)] 98.8 F (37.1 C) (06/27 0540) Pulse Rate:  [92-119] 103 (06/27 0540) Resp:  [16-18] 16 (06/27 0540) BP: (107-114)/(76-84) 109/78 (06/27 0540) SpO2:  [98 %-100 %] 98 % (06/27 0540) Last BM Date: 04/27/19  Intake/Output from previous day: 06/26 0701 - 06/27 0700 In: 1998 [P.O.:300; I.V.:1182; IV Piggyback:515.9] Out: 3400 [Urine:1700; Emesis/NG output:900; Drains:25; Stool:775] Intake/Output this shift: No intake/output data recorded.  PE: Gen: Awake and alert, NAD Heart: Tachycardic  Lungs: CTA b/l, normal effort and rate.  Abd: Soft, ND.Midline wound open with healthy granulation tissue. Cellulitis resolved. Colostomy bag in place, with large amount of brown soft stool in bag along with scant air. NG tube in place currently clamped. JP output serosanguineous Msk: Right leg cast. Left lower leg without edema or calf tenderness. Skin:  Warm and dry.  Lab Results:  Recent Labs    04/26/19 1135 04/27/19 0403  WBC 6.9 7.8  HGB 8.5* 8.6*  HCT 26.1* 28.0*  PLT 490* 497*   BMET Recent Labs    04/27/19 0403 04/28/19 0415  NA 143 141  K 3.6 4.2  CL 110 109  CO2 25 26  GLUCOSE 109* 133*  BUN 29* 31*  CREATININE 0.49 0.54  CALCIUM 8.6* 8.5*   PT/INR No results for input(s): LABPROT, INR in the last 72 hours. CMP     Component Value Date/Time   NA 141 04/28/2019 0415   K 4.2 04/28/2019 0415   CL 109 04/28/2019 0415   CO2 26 04/28/2019 0415   GLUCOSE 133 (H) 04/28/2019 0415   BUN 31 (H) 04/28/2019 0415   CREATININE 0.54 04/28/2019 0415   CALCIUM 8.5 (L) 04/28/2019 0415   PROT 6.4 (L) 04/28/2019 0415   ALBUMIN 2.6 (L) 04/28/2019 0415   AST 37 04/28/2019 0415   ALT 83 (H) 04/28/2019 0415   ALKPHOS 149 (H) 04/28/2019 0415   BILITOT  0.4 04/28/2019 0415   GFRNONAA >60 04/28/2019 0415   GFRAA >60 04/28/2019 0415   Lipase     Component Value Date/Time   LIPASE 25 09/12/2015 1615       Studies/Results: No results found.  Anti-infectives: Anti-infectives (From admission, onward)   Start     Dose/Rate Route Frequency Ordered Stop   04/23/19 0200  piperacillin-tazobactam (ZOSYN) IVPB 3.375 g     3.375 g 12.5 mL/hr over 240 Minutes Intravenous Every 8 hours 04/23/19 0144 04/23/19 0832   04/16/19 0600  cefoTEtan (CEFOTAN) 2 g in sodium chloride 0.9 % 100 mL IVPB     2 g 200 mL/hr over 30 Minutes Intravenous On call to O.R. 04/15/19 1505 04/17/19 0559   04/05/19 0600  piperacillin-tazobactam (ZOSYN) IVPB 3.375 g  Status:  Discontinued     3.375 g 12.5 mL/hr over 240 Minutes Intravenous Every 8 hours 04/05/19 0121 04/23/19 0144   04/05/19 0000  piperacillin-tazobactam (ZOSYN) IVPB 3.375 g     3.375 g 100 mL/hr over 30 Minutes Intravenous  Once 04/04/19 2355 04/05/19 0119       Assessment/Plan GERD Osteoarthritis - takes meloxicam and tramadol at baseline RecentR flatfoot reconstruction and lapidus bunion correctionsurgery by Dr. Hewitt5/26/2020- per note on 6/15 "patient is fine to stay in her post-op splint until she is D/C'd from the hospital" & "  stitches can stay in without causing any type of difficulty in regards to her wound healing" Protein Calorie Malnutrition - prealbumin <5(6/10) -> 8.3 (6/15) -> 12.3 (6/22), on TPN Anemia -Hgb 8.6& stable(monitor)  Tachycardia -HR ~100's since admission. EKG 6/23 with sinus tachycardia. No hypotension or hypoxia. Denies SOB.No LLE calf tenderness or edema. Hgb stable.   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 Marcello Moores - 04/18/2019 - POD #10 - Cystoscopy with Stent placement - Dr. Matilde Sprang - 6/17 - Foley out, voiding on own. Ureteral stents per  Urology.UA without definitive UTI. Urine cx w/ <100,000 colonies/ml. No indication of UTI. Defer to Urology - Continues to have ostomy output, but ongoing nausea.  Likely gastric ileus.  Schedule Reglan.  -Multimodal pain control, minimize narcotics as able - Continue TPN for nutrition. Pre-albumin up to 12.3 (6/22). LFT's are very mildly up, continue to monitor - Continue surgical drain.  - Mobilizing more, continue to encourage. PT.Daughterbroughtscooter - Encourage IS use.  -Completed 5 day course of post-op Zosyn.  - Surgical pathology benign. Diverticular disease with transmural defect. One benign lymph node, serosal adhesions, margins histologically viable, no evidence of malignancy - Wound infection opened 6/25. WTD BID. Cellulitis improved. Cx w/out growth. No abx indicated.   FEN -Sips of clears, NG tube clamped TPN, K 4.2 Mg 2 VTE -SCDs, Lovenox, Mobilize as able. ID -Completed 5 day course of post-op Zosyn(6/17 - 6/22), afebrile, WBC 7,800 Foley - Removed Follow-Up: Dr. Marcello Moores. Dr. Matilde Sprang (Urology)    LOS: 23 days    [facility] 04/28/2019, 7:24 AM  Please see AMION to contact the on-call provider if needed

## 2019-04-29 MED ORDER — CIPROFLOXACIN IN D5W 400 MG/200ML IV SOLN
400.0000 mg | Freq: Two times a day (BID) | INTRAVENOUS | Status: DC
  Administered 2019-04-29 – 2019-05-02 (×7): 400 mg via INTRAVENOUS
  Filled 2019-04-29 (×7): qty 200

## 2019-04-29 MED ORDER — BOOST / RESOURCE BREEZE PO LIQD CUSTOM: 1.0000 | Freq: Three times a day (TID) | ORAL | Status: DC

## 2019-04-29 MED ORDER — TRAVASOL 10 % IV SOLN
INTRAVENOUS | Status: AC
  Administered 2019-04-29: 18:00:00 via INTRAVENOUS
  Filled 2019-04-29: qty 990

## 2019-04-29 NOTE — Progress Notes (Signed)
PROGRESS NOTE    Molly Beck  ZOX:[id number] DOB: May 06, 1966 DOA: 04/04/2019 PCP: Clementeen Graham, [name]   Brief Narrative:  Molly Beck a 53 y.o.femalewith medical history significant ofGERD, elective surgery of her right foot a week ago presenting to the hospital for evaluation of abdominal pain.Patient states she had areconstructive right foot surgery done a week ago. States since then she has been constipated due to taking pain medications. Her last bowel movement was prior to the surgery. For the past few days she is having left lower quadrant abdominal pain which became worse for the past 24 hours. The pain is sharp and excruciating. States today she called her surgeon's office and was advised to take magnesium citrate for constipation. She vomited soon after taking magnesium citrate. Denies any fevers. She continues to complain of abdominal pain despite receiving a dose ofmorphine and multiple doses of fentanyl in the ED. CT abdomen pelvis showing acute sigmoid colitis with small foci of adjacent gas that may be extra luminal and may indicate microperforation. No abscess or drainable fluid collection. She was admitted for further treatment and care. General surgery was consulted.  **Interim History Patient with improved abdominal discomfort overnight following insertion of NG tube with significant output. Repeat imaging studies suggest continued small bowel obstruction. Intra-abdominal drains continue to drain well. Patient is continued on TPN because of lack of progression and because of concern for bilious drainage she underwent surgical intervention on 04/18/2019 and urology was consulted for bilateral stent placement and cystoscopy. General surgery did exploratory laparotomy with small bowel resection and Hartman's procedure along with urology doing a cystoscopy with bilateral stent placement. Today patient was in a lot of pain so surgery was admitted  Dilaudid PCA. Patient was complaining of significant pressure in her bladder felt that she had a urinary tract infection however it turned out that her catheter was blocked and she is re-cath and 1 L was removed.   Foley catheter was removed and pain medications are being adjusted and increased. Patient undergoing NG tube clamping and has been ambulating the halls. Better controlled with the PCA Dilaudid. Surgery recommending IV antibiotics for 5 days total Post-op after Surgical Intervention (Today). Will likely need to have her ureteral stenting removed eventually. Continue TNA nutrition for now until bowels are improved continue to mobilize.     Assessment & Plan:   Principal Problem:   Perforated sigmoid colon  Active Problems:   Colitis   Sepsis (HCC)   Hypokalemia   H/O foot surgery   Intraabdominal fluid collection   Encounter for imaging study to confirm nasogastric (NG) tube placement   Small bowel obstruction (HCC)   SBO (small bowel obstruction) (HCC)   Sepsis secondary to Acute Sigmoid Colitis with Microperforation complicated by 2 Absecesses status post drain placement and now exploratory laparotomy with Hartman's procedure a small bowel resection along with cystoscopy and bilateral stent placement SBO Sepsis, present on admission and physiology is improving -Patient presentedto ED with progressive abdominal pain. CT abdomen/pelvis notable for acute sigmoid colitis with adjacent gas likely secondary to microperforation.  -Patient was initially started on IV Zosyn. During her initial hospitalization, her abdominal pain continued not to improve with resultant fevers and elevated white count. Repeat CT abdomen/pelvis reveals marked worsening in the pelvis with 2 abscesses. Interventional radiology was consulted and placed two drains on 04/10/2019.  -General surgery/IRsaw in consultation -Wound culture 04/10/2019: Abundant mixed organisms, none predominant -IV  Zosynnow Discontinued by General Surgery -NGT placement6/15/2020per general surgeryclamping  today 6/25 to determineif they can be removed, -TPN per pharmacy -Continue to monitor drain output and BMs -Because of minimal improvement with green bile color in anterior abdominal drain, General Surgerydidexploratory laparotomy and patient underwent a Hartman's procedure and small bowel resection; she is postoperative day 11 today -Urology Consulted by General Surgery for Bilateral Renal Stents and Cystoscopy; Foley catheter was replaced and now has been removed and will need her ureteral stenting removed in the outpatient setting -Last SARS-CoV-2 testing was negative on 04/05/2019 however this wasrepeated prior to Surgery -KUB on 04/18/2019 showed "Improving small bowel obstruction. Contrast in the colon now reaches the rectum. Stable percutaneous drains in the pelvis."  -Continue supportive care and antiemetics -Pain control per general surgery  UTI by UCX; Reviewed cx, given pts stents, and mild tachy, added abx 6/28 for five day course per sensitivities  Rash -Maculopapular rash bilateral distribution on her back, mod improvemernt.  -likleydue to heat/sweat rash or contact from bedsheets.  -Continue IV Benadryl and hydrocortisone cream prn.  -Continue hygiene and change of bed sheets daily  Recent right foot surgery and Tendon Repair -Currently in a cast, plans to follow-up with her orthopedic surgeon as an outpatient -PT following -Continue nonweightbearing right lower extremity -No pain, normal white blood cell count, no fevers, given patient's recent surgery would expect inflammatory markers be elevated we will not check  Hypokalemia -K+ was 4.2 -Continue to Monitor- pt is on TPN -Repeat CMP in AM  Normocytic Anmeia -Patient's Hgb/Hct stable and isSTABLE, WILL CONTINUE TO MONITOR -Anemia panel done and showed an iron level of 16, U IBC 189, TIBC of 205, saturation  ratios of 8%, ferritin level 171, folate level 9.5, and vitamin B12 848  Hyperglycemia -improved, Likely reactive and in the setting of TPN; CBG's ranging from 100-118 -HbA1c was 4.9 on 04/14/2019 -Glucose this a.m. on CMP was 128 -CBG's ranging from88-108  GERD -Continue with PPI IV with Pantoprazole 40 mg q24h until able to take p.o.  Anxiety -Continue with Lorazepam 0.5 mg IV every 6 as needed for Anxiety  Obesity -Estimated body mass index is 30.84 kg/m as calculated from the following: -Weight Loss and Dietary Counseling given   Leukocytosis -In the setting of surgery and reactive -WBCimproved and now 7.8 -Continue to monitor for signs and symptoms of infection-none evident currently -Repeat CBC in a.m.  Abnormal ALT -mild bumpstable,, tb nl,reviewed prior history, neg hepatitis panel.  Thrombocytosis  -Likely reactive, currently497and trending down -Monitor with CBC  Hypernatremia, improved -Patient sodium level this morning was 141 -In the setting of TPN administration -Continue monitor and trend and repeat sodium level in a.m.  DVT prophylaxis: Lovenox SQ  Code Status: full    Code Status Orders  (From admission, onward)         Start     Ordered   04/05/19 0100  Full code  Continuous     04/05/19 0101        Code Status History    This patient has a current code status but no historical code status.   Advance Care Planning Activity     Family Communication: per primary  Disposition Plan:   per primary Consults called: None Admission status: Inpatient   Consultants:   Interventional radiology, general surgery, orthopedic surgery, urology  Procedures:  Ct Abdomen Pelvis W Contrast  Result Date: 04/15/2019 CLINICAL DATA:  Severe lower abdominal pain. Unable to eat or drink. History of diverticulitis with recent abscess. Drain in place. EXAM: CT  ABDOMEN AND PELVIS WITH CONTRAST TECHNIQUE: Multidetector CT imaging of the  abdomen and pelvis was performed using the standard protocol following bolus administration of intravenous contrast. CONTRAST:  100mL OMNIPAQUE IOHEXOL 300 MG/ML SOLN, 30mL OMNIPAQUE IOHEXOL 300 MG/ML SOLN COMPARISON:  Procedures CT 04/10/2019. CT of the abdomen and pelvis 04/09/2019 FINDINGS: Lower chest: The lung bases are clear without focal nodule, mass, or airspace disease. Small pericardial effusion is similar to prior studies. No pleural effusions are present. Hepatobiliary: No focal liver abnormality is seen. No gallstones, gallbladder wall thickening, or biliary dilatation. Distension of the gallbladder is likely secondary to fasting. Pancreas: Unremarkable. No pancreatic ductal dilatation or surrounding inflammatory changes. Spleen: Normal in size without focal abnormality. Adrenals/Urinary Tract: Adrenal glands are normal bilaterally. Kidneys and ureters are unremarkable. There is no nodule or mass lesion. No stone is present. There is no obstruction. Stomach/Bowel: The stomach is moderately distended and fluid filled. There is marked distension of proximal small bowel loops measuring up to 3.8 cm in transverse diameter. Transition point is in the anatomic pelvis adjacent to the more anterior and superior abscess. The distal small bowel is collapsed. Contrast is present in the colon. Gas and stool are present throughout the colon to the rectum. There is a small amount of contrast in the rectum. Inflammatory changes about the sigmoid colon are improving. Vascular/Lymphatic: No significant vascular calcifications are present. Subcentimeter lymph nodes are likely reactive. Reproductive: Uterus and bilateral adnexa are unremarkable. IUD is in place. Other: Anterior and posterior drainage catheters are stable in position. The anterior collection is collapsed. There is an adjacent collection which may be separate from the drain measuring 2.0 x 2.4 x 1.9 cm. The posterior drain is in place. Previously noted  inferior pelvic collection is markedly reduced in size. This collection now measures 3.2 x 6.0 x 3.4 cm. There is still gas with a fluid collection. No other focal fluid collections are present. Musculoskeletal: Vertebral body heights and alignment are maintained. No focal lytic or blastic lesions are present. Small Schmorl's nodes are present. Bony pelvis is within normal limits. The hips are located. Severe degenerative changes are again noted in the left hip. More mild degenerative changes are present on the right. IMPRESSION: 1. Dilated small bowel with a transition point in the anatomic pelvis adjacent to the inflammation is consistent with small bowel obstruction. No discrete bowel injury is evident. 2. The anterior abscess cavity has collapsed. 3. Adjacent small fluid collection measures 2.0 x 2.4 x 1.9 cm. 4. Marked reduction in size of the more inferior pelvic collection, now measuring 3.2 x 6.0 x 3.4 cm. Pigtail drainage catheter remains in place. 5. Reduced inflammatory changes surrounding the sigmoid colon. Electronically Signed   By: Marin Robertshristopher  Mattern M.D.   On: 04/15/2019 15:26   Ct Abdomen Pelvis W Contrast  Result Date: 04/09/2019 CLINICAL DATA:  Elevated white blood cell count and patient with abdominal pain for 1 week. EXAM: CT ABDOMEN AND PELVIS WITH CONTRAST TECHNIQUE: Multidetector CT imaging of the abdomen and pelvis was performed using the standard protocol following bolus administration of intravenous contrast. CONTRAST:  100 mL OMNIPAQUE IOHEXOL 300 MG/ML  SOLN COMPARISON:  CT abdomen pelvis 04/04/2019. FINDINGS: Lower chest: Heart size is normal. No pleural or pericardial effusion. Minimal right basilar atelectasis noted. Hepatobiliary: The gallbladder is distended but otherwise unremarkable. The liver and biliary tree appear normal. Pancreas: Unremarkable. No pancreatic ductal dilatation or surrounding inflammatory changes. Spleen: Normal in size without focal abnormality.  Adrenals/Urinary Tract: Small  cyst lower pole right kidney incidentally noted. The kidneys otherwise appear normal. Ureters and urinary bladder are normal in appearance. Stomach/Bowel: Extensive inflammatory change about the sigmoid colon has markedly worsened. There is an air and fluid collection posterior and to the right of the uterus which measures approximately 6.5 cm transverse by 9 cm AP by 5 cm craniocaudal consistent with an abscess. A second air and fluid collection consistent with abscess more superiorly in the central pelvis measures 4 cm AP by 3.5 cm transverse by 7.5 cm craniocaudal. This collection is contains predominantly gas and it overlies a loop of small bowel which appears inflamed. Presacral edema is identified. The walls of the sigmoid colon are markedly thickened inflamed. The colon otherwise appears normal. The stomach, small bowel and appendix are normal in appearance. Vascular/Lymphatic: No significant vascular findings are present. No enlarged abdominal or pelvic lymph nodes. Reproductive: Uterus and bilateral adnexa are unremarkable. IUD noted. Other: None. Musculoskeletal: No acute or focal bony abnormality. Severe left hip osteoarthritis noted. IMPRESSION: Marked worsening findings in the pelvis most consistent with acute sigmoid diverticulitis with 2 new abscesses identified as described above. These results were called by telephone at the time of interpretation on 04/09/2019 at 2:03 pm to [facility] JENNINGS[facility] Who verbally acknowledged these results. Electronically Signed   By: Drusilla Kannerhomas  Dalessio M.D.   On: 04/09/2019 14:07   Ct Abdomen Pelvis W Contrast  Result Date: 04/04/2019 CLINICAL DATA:  Abdominal pain and decreased bowel movement since surgery EXAM: CT ABDOMEN AND PELVIS WITH CONTRAST TECHNIQUE: Multidetector CT imaging of the abdomen and pelvis was performed using the standard protocol following bolus administration of intravenous contrast. CONTRAST:  100mL OMNIPAQUE  IOHEXOL 300 MG/ML  SOLN COMPARISON:  None. FINDINGS: LOWER CHEST: There is no basilar pleural or apical pericardial effusion. HEPATOBILIARY: The hepatic contours and density are normal. There is no intra- or extrahepatic biliary dilatation. The gallbladder is normal. PANCREAS: The pancreatic parenchymal contours are normal and there is no ductal dilatation. There is no peripancreatic fluid collection. SPLEEN: Normal. ADRENALS/URINARY TRACT: --Adrenal glands: Normal. --Right kidney/ureter: No hydronephrosis, nephroureterolithiasis, perinephric stranding or solid renal mass. --Left kidney/ureter: No hydronephrosis, nephroureterolithiasis, perinephric stranding or solid renal mass. --Urinary bladder: Normal for degree of distention STOMACH/BOWEL: --Stomach/Duodenum: There is no hiatal hernia or other gastric abnormality. The duodenal course and caliber are normal. --Small bowel: No dilatation or inflammation. --Colon: There is mild inflammation of the sigmoid colon. There are small foci of gas along the anterior contour of the distal sigmoid colon that may indicate microperforation (coronal images 43, 46, 47). Large amount of colonic stool. --Appendix: Not clearly visualized. VASCULAR/LYMPHATIC: Normal course and caliber of the major abdominal vessels. No abdominal or pelvic lymphadenopathy. REPRODUCTIVE: There is a T-shaped contraceptive device within the uterus. MUSCULOSKELETAL. No bony spinal canal stenosis or focal osseous abnormality. OTHER: None. IMPRESSION: Acute sigmoid colitis with small foci of adjacent gas that may be extraluminal. This may indicate micro perforation. No abscess or drainable fluid collection. Electronically Signed   By: Deatra RobinsonKevin  Herman M.D.   On: 04/04/2019 23:32   Dg Abd Portable 1v  Result Date: 04/18/2019 CLINICAL DATA:  Small bowel obstruction. EXAM: PORTABLE ABDOMEN - 1 VIEW COMPARISON:  April 17, 2019 FINDINGS: Two drains are seen in the pelvis, in stable position. Contrast is seen in  the colon, extending into the rectum. Few mildly prominent loops of small bowel remain in the central abdomen measuring up to 3.3 cm. The more dilated loops superiorly is decreased in  caliber. No free air, portal venous gas, or pneumatosis. IMPRESSION: Improving small bowel obstruction. Contrast in the colon now reaches the rectum. Stable percutaneous drains in the pelvis. Electronically Signed   By: Gerome Sam III M.D   On: 04/18/2019 08:12   Dg Abd Portable 1v  Result Date: 04/17/2019 CLINICAL DATA:  Small bowel obstruction. EXAM: PORTABLE ABDOMEN - 1 VIEW COMPARISON:  One-view abdomen 04/16/2019 FINDINGS: Dilated loops of small bowel in the upper abdomen is similar the prior study. More distal loops demonstrate some decompression appear to the most recent x-ray. Surgical drains remain in place. No definite free air is present. Contrast is again noted in the colon. IMPRESSION: 1. Similar dilation of transverse loop of small bowel in the upper abdomen. 2. The remainder of the small bowel has begun to decompressed. 3. Drainage catheters are stable. Electronically Signed   By: Marin Roberts M.D.   On: 04/17/2019 10:38   Dg Abd Portable 1v-small Bowel Obstruction Protocol-initial, 8 Hr Delay  Result Date: 04/16/2019 CLINICAL DATA:  Small bowel obstruction. 8 hour delayed film. EXAM: PORTABLE ABDOMEN - 1 VIEW COMPARISON:  CT yesterday. FINDINGS: Enteric contrast in the colon, however unclear if this contrast was from prior CT or administered for small bowel protocol. There is residual contrast in the stomach. Persistent dilated small bowel in the central abdomen a 5.1 cm. Two drainage catheters overlie the pelvis. Advanced left hip osteoarthritis. IMPRESSION: Enteric contrast in the colon, however it is unclear if this is residual contrast that was seen on CT yesterday versus that administered for the small-bowel protocol. There is residual contrast in the stomach and persistent dilated small bowel  in the central abdomen. Recommend 24 hour delayed film. Electronically Signed   By: Narda Rutherford M.D.   On: 04/16/2019 23:43   Dg Abd Portable 1v-small Bowel Protocol-position Verification  Result Date: 04/16/2019 CLINICAL DATA:  NG tube placement. EXAM: PORTABLE ABDOMEN - 1 VIEW COMPARISON:  Abdominal CT 04/15/2019 FINDINGS: Enteric catheter projects over the expected location of gastric cardia. Persistent small bowel loop dilation with maximum diameter of 5.4 cm. No free intra-abdominal gas is seen. IMPRESSION: 1. Enteric catheter projects over the expected location of gastric cardia. 2. Persistent small bowel obstruction. Electronically Signed   By: Ted Mcalpine M.D.   On: 04/16/2019 13:20   Dg C-arm 1-60 Min-no Report  Result Date: 04/18/2019 Fluoroscopy was utilized by the requesting physician.  No radiographic interpretation.   Ct Image Guided Drainage By Percutaneous Catheter  Result Date: 04/10/2019 CLINICAL DATA:  Sigmoid diverticulitis with rupture and abscesses in the central upper pelvis and deep lower posterior pelvis. EXAM: 1. CT GUIDED CATHETER DRAINAGE OF PERITONEAL DIVERTICULAR ABSCESS 2. CT-GUIDED CATHETER DRAINAGE OF PELVIC PERITONEAL ABSCESS ANESTHESIA/SEDATION: 4.0 mg IV Versed 100 mcg IV Fentanyl Total Moderate Sedation Time:  52 minutes The patient's level of consciousness and physiologic status were continuously monitored during the procedure by Radiology nursing. PROCEDURE: The procedure, risks, benefits, and alternatives were explained to the patient. Questions regarding the procedure were encouraged and answered. The patient understands and consents to the procedure. A time out was performed prior to initiating the procedure. The anterior abdominal wall followed by the right transgluteal region were prepped with chlorhexidine in a sterile fashion, and a sterile drape was applied covering the operative field. A sterile gown and sterile gloves were used for the  procedure. Local anesthesia was provided with 1% Lidocaine. An 18 gauge trocar needle was advanced from a right anterior approach to  the level of a sigmoid diverticular abscess. After confirming needle tip position, aspiration was performed. A guidewire was advanced and the needle removed. The tract was dilated over the wire. A 10 French percutaneous drainage catheter was then advanced over the wire and formed. Catheter position was confirmed by CT. Additional fluid sample was aspirated from the drain. The drainage catheter was then flushed with saline and attached to a suction bulb. A Prolene retention suture was applied at the skin exit site. The patient was then placed in a prone position and from a right transgluteal approach, an 18 gauge trocar needle was advanced to the level a second posterior lower pelvic abscess abscess. After confirming needle tip position, aspiration was performed. A guidewire was advanced and the needle removed. The tract was dilated over the wire. A 10 French percutaneous drainage catheter was then advanced over the wire and formed. Catheter position was confirmed by CT. Additional fluid sample was aspirated from the drain. The drainage catheter was then flushed with saline and attached to a suction bulb. A Prolene retention suture was applied at the skin exit site. COMPLICATIONS: None FINDINGS: There was an anterior approach available to the level of the central lower abdominal sigmoid diverticular abscess. Aspiration yielded feculent material. Additional aspiration via a drainage catheter yielded purulent and feculent fluid. A 10 French drain was formed in the abscess cavity. Persistent posterior pelvic abscess was able to be accessed from a right transgluteal approach. Aspiration yielded feculent and purulent fluid. A 10 French drain was formed in the abscess. IMPRESSION: CT-guided percutaneous drainage of 2 separate peritoneal abscess collections related to ruptured sigmoid  diverticulitis. A 10 French drain was placed from an anterior approach into the sigmoid diverticular abscess. A second 10 French drain was placed from a posterior approach into the posterior deep pelvic abscess. Both drains were attached to suction bulbs. Electronically Signed   By: Aletta Edouard M.D.   On: 04/10/2019 16:41   Ct Image Guided Drainage By Percutaneous Catheter  Result Date: 04/10/2019 CLINICAL DATA:  Sigmoid diverticulitis with rupture and abscesses in the central upper pelvis and deep lower posterior pelvis. EXAM: 1. CT GUIDED CATHETER DRAINAGE OF PERITONEAL DIVERTICULAR ABSCESS 2. CT-GUIDED CATHETER DRAINAGE OF PELVIC PERITONEAL ABSCESS ANESTHESIA/SEDATION: 4.0 mg IV Versed 100 mcg IV Fentanyl Total Moderate Sedation Time:  52 minutes The patient's level of consciousness and physiologic status were continuously monitored during the procedure by Radiology nursing. PROCEDURE: The procedure, risks, benefits, and alternatives were explained to the patient. Questions regarding the procedure were encouraged and answered. The patient understands and consents to the procedure. A time out was performed prior to initiating the procedure. The anterior abdominal wall followed by the right transgluteal region were prepped with chlorhexidine in a sterile fashion, and a sterile drape was applied covering the operative field. A sterile gown and sterile gloves were used for the procedure. Local anesthesia was provided with 1% Lidocaine. An 18 gauge trocar needle was advanced from a right anterior approach to the level of a sigmoid diverticular abscess. After confirming needle tip position, aspiration was performed. A guidewire was advanced and the needle removed. The tract was dilated over the wire. A 10 French percutaneous drainage catheter was then advanced over the wire and formed. Catheter position was confirmed by CT. Additional fluid sample was aspirated from the drain. The drainage catheter was then  flushed with saline and attached to a suction bulb. A Prolene retention suture was applied at the skin exit site. The patient  was then placed in a prone position and from a right transgluteal approach, an 18 gauge trocar needle was advanced to the level a second posterior lower pelvic abscess abscess. After confirming needle tip position, aspiration was performed. A guidewire was advanced and the needle removed. The tract was dilated over the wire. A 10 French percutaneous drainage catheter was then advanced over the wire and formed. Catheter position was confirmed by CT. Additional fluid sample was aspirated from the drain. The drainage catheter was then flushed with saline and attached to a suction bulb. A Prolene retention suture was applied at the skin exit site. COMPLICATIONS: None FINDINGS: There was an anterior approach available to the level of the central lower abdominal sigmoid diverticular abscess. Aspiration yielded feculent material. Additional aspiration via a drainage catheter yielded purulent and feculent fluid. A 10 French drain was formed in the abscess cavity. Persistent posterior pelvic abscess was able to be accessed from a right transgluteal approach. Aspiration yielded feculent and purulent fluid. A 10 French drain was formed in the abscess. IMPRESSION: CT-guided percutaneous drainage of 2 separate peritoneal abscess collections related to ruptured sigmoid diverticulitis. A 10 French drain was placed from an anterior approach into the sigmoid diverticular abscess. A second 10 French drain was placed from a posterior approach into the posterior deep pelvic abscess. Both drains were attached to suction bulbs. Electronically Signed   By: Irish LackGlenn  Yamagata M.D.   On: 04/10/2019 16:41   [place] Ekg Site Rite  Result Date: 04/10/2019 If Site Rite image not attached, placement could not be confirmed due to current cardiac rhythm.    Antimicrobials:   cipro 1/5  Completed zosyn postop     Subjective: Reports mild abdominal pain this morning no acute decompensation   Objective: Vitals:   04/28/19 2039 04/29/19 0134 04/29/19 0423 04/29/19 1029  BP: 104/73 108/79 117/75 103/70  Pulse: (!) 109 (!) 108 (!) 115 (!) 108  Resp: 20 17 19 16   Temp: (!) 97.5 F (36.4 C) 98.3 F (36.8 C) 98.8 F (37.1 C) 98.5 F (36.9 C)  TempSrc: Oral Oral Oral Oral  SpO2: 98% 98% 98% 97%  Weight:      Height:        Intake/Output Summary (Last 24 hours) at 04/29/2019 1235 Last data filed at 04/29/2019 1045 Gross per 24 hour  Intake 1973.35 ml  Output 1620 ml  Net 353.35 ml   Filed Weights   04/16/19 1700 04/17/19 1050 04/24/19 1233  Weight: 79.7 kg 81.5 kg 80.7 kg    Examination:  General exam: Appears calm and comfortable  Respiratory system: Clear to auscultation. Respiratory effort normal. Cardiovascular system: S1 & S2 heard, RRR. No JVD, murmurs, rubs, gallops or clicks. No pedal edema. Gastrointestinal system: Soft, ND.Midline wound open with healthy granulation tissue. There is no cellulitis or active infection on exam.Colostomy bag in place,with large amount of brown soft stool in bag, . Central nervous system: Alert and oriented. No focal neurological deficits. Extremities: Warm well perfused cast right lower extremity, no pain Skin: No rashes, lesions or ulcers Psychiatry: Judgement and insight appear normal. Mood & affect appropriate.     Data Reviewed: I have personally reviewed following labs and imaging studies  CBC: Recent Labs  Lab 04/23/19 0339 04/24/19 0436 04/26/19 1135 04/27/19 0403  WBC 5.7 5.8 6.9 7.8  NEUTROABS 4.0 4.2 5.4  --   HGB 7.9* 7.9* 8.5* 8.6*  HCT 25.3* 25.2* 26.1* 28.0*  MCV 98.4 98.1 102.8* 97.2  PLT 538*  513* 490* 497*   Basic Metabolic Panel: Recent Labs  Lab 04/23/19 0339 04/24/19 0436 04/25/19 0420 04/26/19 0336 04/27/19 0403 04/28/19 0415  NA 146* 145 145 146* 143 141  K 3.5 3.9 3.7 3.8 3.6 4.2  CL 108 113*  114* 113* 110 109  CO2 GLUCOSE 115* 118* 122* 117* 109* 133*  BUN 33* 28* 32* 30* 29* 31*  CREATININE 0.64 0.53 0.47 0.54 0.49 0.54  CALCIUM 8.6* 8.7* 8.6* 8.6* 8.6* 8.5*  MG 2.0 2.0  --  2.0  --  2.0  PHOS 3.9 3.4 3.4 3.9  --   --    GFR: Estimated Creatinine Clearance: 84.5 mL/min (by C-G formula based on SCr of 0.54 mg/dL). Liver Function Tests: Recent Labs  Lab 04/23/19 0339 04/24/19 0436 04/26/19 0336 04/27/19 0403 04/28/19 0415  AST 20 22 61* 47* 37  ALT 26 30 90* 91* 83*  ALKPHOS 105 117 145* 161* 149*  BILITOT 1.0 0.6 0.4 0.7 0.4  PROT 6.1* 6.3* 6.3* 6.8 6.4*  ALBUMIN 2.4* 2.5* 2.6* 2.8* 2.6*   No results for input(s): LIPASE, AMYLASE in the last 168 hours. No results for input(s): AMMONIA in the last 168 hours. Coagulation Profile: No results for input(s): INR, PROTIME in the last 168 hours. Cardiac Enzymes: No results for input(s): CKTOTAL, CKMB, CKMBINDEX, TROPONINI in the last 168 hours. BNP (last 3 results) No results for input(s): PROBNP in the last 8760 hours. HbA1C: No results for input(s): HGBA1C in the last 72 hours. CBG: Recent Labs  Lab 04/22/19 1550 04/22/19 2127 04/23/19 0111  GLUCAP 88 108* 89   Lipid Profile: No results for input(s): CHOL, HDL, LDLCALC, TRIG, CHOLHDL, LDLDIRECT in the last 72 hours. Thyroid Function Tests: No results for input(s): TSH, T4TOTAL, FREET4, T3FREE, THYROIDAB in the last 72 hours. Anemia Panel: No results for input(s): VITAMINB12, FOLATE, FERRITIN, TIBC, IRON, RETICCTPCT in the last 72 hours. Sepsis Labs: No results for input(s): PROCALCITON, LATICACIDVEN in the last 168 hours.  Recent Results (from the past 240 hour(s))  Culture, Urine     Status: Abnormal   Collection Time: 04/26/19  9:45 AM   Specimen: Urine, Random  Result Value Ref Range Status   Specimen Description   Final    URINE, RANDOM Performed at [facility], [address] [place], [place] [address]     Special Requests   Final    NONE Performed at [facility], [address], [place], [place] [address]    Culture 40,000 COLONIES/mL ENTEROBACTER CLOACAE (A)  Final   Report Status 04/28/2019 FINAL  Final   Organism ID, Bacteria ENTEROBACTER CLOACAE (A)  Final      Susceptibility   Enterobacter cloacae - MIC*    CEFAZOLIN >=64 RESISTANT Resistant     CEFTRIAXONE >=64 RESISTANT Resistant     CIPROFLOXACIN <=0.25 SENSITIVE Sensitive     GENTAMICIN <=1 SENSITIVE Sensitive     IMIPENEM <=0.25 SENSITIVE Sensitive     NITROFURANTOIN 64 INTERMEDIATE Intermediate     TRIMETH/SULFA <=20 SENSITIVE Sensitive     PIP/TAZO >=128 RESISTANT Resistant     * 40,000 COLONIES/mL ENTEROBACTER CLOACAE  Aerobic Culture (superficial specimen)     Status: None   Collection Time: 04/26/19 10:19 AM   Specimen: Wound  Result Value Ref Range Status   Specimen Description   Final    WOUND INCISION ABD Performed at [facility], [address] [place], [place]  [address]    Special Requests   Final    NONE Performed at [facility], [address], [place], [place] [address]    Gram Stain   Final    RARE WBC PRESENT, PREDOMINANTLY PMN NO ORGANISMS SEEN Performed at [facility], [address], [place], [place] [address]    Culture RARE ENTEROBACTER CLOACAE  Final   Report Status 04/28/2019 FINAL  Final   Organism ID, Bacteria ENTEROBACTER CLOACAE  Final      Susceptibility   Enterobacter cloacae - MIC*    CEFAZOLIN >=64 RESISTANT Resistant     CEFEPIME 2 SENSITIVE Sensitive     CEFTAZIDIME >=64 RESISTANT Resistant     CEFTRIAXONE >=64 RESISTANT Resistant     CIPROFLOXACIN <=0.25 SENSITIVE Sensitive     GENTAMICIN <=1 SENSITIVE Sensitive     IMIPENEM <=0.25 SENSITIVE Sensitive     TRIMETH/SULFA <=20 SENSITIVE Sensitive     PIP/TAZO >=128 RESISTANT Resistant     * RARE ENTEROBACTER CLOACAE         Radiology Studies: No  results found.      Scheduled Meds:  acetaminophen  650 mg Oral Q6H   enoxaparin (LOVENOX) injection  40 mg Subcutaneous Q24H   feeding supplement  1 Container Oral TID BM   fluticasone  1 spray Each Nare Daily   gabapentin  300 mg Oral TID   lip balm  1 application Topical BID   metoCLOPramide (REGLAN) injection  10 mg Intravenous Q6H   pantoprazole (PROTONIX) IV  40 mg Intravenous QHS   sodium chloride flush  10-40 mL Intracatheter Q12H   Continuous Infusions:  ciprofloxacin 400 mg (04/29/19 0944)   methocarbamol (ROBAXIN) IV 1,000 mg (04/29/19 1134)   TPN ADULT (ION) 75 mL/hr at 04/29/19 0600   TPN ADULT (ION)       LOS: 24 days    Time spent: 35 min    Burke Keels, MD [facility]  If 7PM-7AM, please contact night-coverage  04/29/2019, 12:35 PM

## 2019-04-29 NOTE — Progress Notes (Signed)
11 Days Post-Op  Subjective: CC: Emesis NG has been clamped 24h. Reports phlegm in her chest but no nausea. Tolerating sips of clears. Cefepime was started yesterday based on cultures of an open wound.   Objective: Vital signs in last 24 hours: Temp:  [97.5 F (36.4 C)-98.8 F (37.1 C)] 98.8 F (37.1 C) (06/28 0423) Pulse Rate:  [104-115] 115 (06/28 0423) Resp:  [16-20] 19 (06/28 0423) BP: (104-117)/(71-79) 117/75 (06/28 0423) SpO2:  [96 %-100 %] 98 % (06/28 0423) Last BM Date: 04/28/19  Intake/Output from previous day: 06/27 0701 - 06/28 0700 In: 2313 [P.O.:360; I.V.:1653; IV Piggyback:300] Out: 2220 [Urine:1450; Emesis/NG output:250; Drains:20; Stool:500] Intake/Output this shift: No intake/output data recorded.  PE: Gen: Sleepy but conversant Heart: Tachycardic  Lungs: CTA b/l, normal effort and rate.  Abd: Soft, ND.Midline wound open with healthy granulation tissue. There is no cellulitis or active infection on exam. Colostomy bag in place, with large amount of brown soft stool in bag along with scant air. NG tube in place currently clamped. JP output serosanguineous. Skin:  Warm and dry.  Lab Results:  Recent Labs    04/26/19 1135 04/27/19 0403  WBC 6.9 7.8  HGB 8.5* 8.6*  HCT 26.1* 28.0*  PLT 490* 497*   BMET Recent Labs    04/27/19 0403 04/28/19 0415  NA 143 141  K 3.6 4.2  CL 110 109  CO2 25 26  GLUCOSE 109* 133*  BUN 29* 31*  CREATININE 0.49 0.54  CALCIUM 8.6* 8.5*   PT/INR No results for input(s): LABPROT, INR in the last 72 hours. CMP     Component Value Date/Time   NA 141 04/28/2019 0415   K 4.2 04/28/2019 0415   CL 109 04/28/2019 0415   CO2 26 04/28/2019 0415   GLUCOSE 133 (H) 04/28/2019 0415   BUN 31 (H) 04/28/2019 0415   CREATININE 0.54 04/28/2019 0415   CALCIUM 8.5 (L) 04/28/2019 0415   PROT 6.4 (L) 04/28/2019 0415   ALBUMIN 2.6 (L) 04/28/2019 0415   AST 37 04/28/2019 0415   ALT 83 (H) 04/28/2019 0415   ALKPHOS 149 (H)  04/28/2019 0415   BILITOT 0.4 04/28/2019 0415   GFRNONAA >60 04/28/2019 0415   GFRAA >60 04/28/2019 0415   Lipase     Component Value Date/Time   LIPASE 25 09/12/2015 1615       Studies/Results: No results found.  Anti-infectives: Anti-infectives (From admission, onward)   Start     Dose/Rate Route Frequency Ordered Stop   04/28/19 1800  ceFEPIme (MAXIPIME) 2 g in sodium chloride 0.9 % 100 mL IVPB  Status:  Discontinued     2 g 200 mL/hr over 30 Minutes Intravenous Every 8 hours 04/28/19 1720 04/29/19 0804   04/23/19 0200  piperacillin-tazobactam (ZOSYN) IVPB 3.375 g     3.375 g 12.5 mL/hr over 240 Minutes Intravenous Every 8 hours 04/23/19 0144 04/23/19 0832   04/16/19 0600  cefoTEtan (CEFOTAN) 2 g in sodium chloride 0.9 % 100 mL IVPB     2 g 200 mL/hr over 30 Minutes Intravenous On call to O.R. 04/15/19 1505 04/17/19 0559   04/05/19 0600  piperacillin-tazobactam (ZOSYN) IVPB 3.375 g  Status:  Discontinued     3.375 g 12.5 mL/hr over 240 Minutes Intravenous Every 8 hours 04/05/19 0121 04/23/19 0144   04/05/19 0000  piperacillin-tazobactam (ZOSYN) IVPB 3.375 g     3.375 g 100 mL/hr over 30 Minutes Intravenous  Once 04/04/19 2355 04/05/19 0119  Assessment/Plan GERD Osteoarthritis - takes meloxicam and tramadol at baseline RecentR flatfoot reconstruction and lapidus bunion correctionsurgery by Dr. Hewitt5/26/2020- per note on 6/15 "patient is fine to stay in her post-op splint until she is D/C'd from the hospital" & "stitches can stay in without causing any type of difficulty in regards to her wound healing" Protein Calorie Malnutrition - prealbumin <5(6/10) -> 8.3 (6/15) -> 12.3 (6/22), on TPN Anemia -Hgb 8.6& stable(monitor)  Tachycardia -HR ~100's since admission. EKG 6/23 with sinus tachycardia. No hypotension or hypoxia. Denies SOB.No LLE calf tenderness or edema. Hgb stable.   PERFORATION OF SMALL BOWEL BY CT GUIDED DRAIN PERFORATED  DIVERTICULITIS -s/p Exploratory laparotomy, Hartman's procedure, small bowel resection - Dr. Maisie Fushomas - 04/18/2019 - POD #11 - Cystoscopy with Stent placement - Dr. Sherron MondayMacDiarmid - 6/17 - Foley out, voiding on own. Ureteral stents per Urology.UA without definitive UTI. Urine cx w/ <100,000 colonies/ml. No indication of UTI. Defer to Urology - Continues to have ostomy output, nausea improved, Likely gastric ileus.  Scheduled Reglan.  -Multimodal pain control, minimize narcotics as able - Continue TPN for nutrition. Pre-albumin up to 12.3 (6/22). LFT's are very mildly up, continue to monitor - Continue surgical drain.  - Mobilizing more, continue to encourage. PT.Daughterbroughtscooter - Encourage IS use.  -Completed 5 day course of post-op Zosyn.  - Surgical pathology benign. Diverticular disease with transmural defect. One benign lymph node, serosal adhesions, margins histologically viable, no evidence of malignancy - Wound infection opened 6/25. WTD BID. Cellulitis resolved. Cx w/ enterobacter but this is an open wound which is no longer clinically infected. No abx indicated. Discontinue cefepime  FEN -Removed NG this morning, try clears/ breeze; TPN, K 4.2 Mg 2 VTE -SCDs, Lovenox, Mobilize as able. ID -Completed 5 day course of post-op Zosyn(6/17 - 6/22), afebrile, WBC 7,800 Foley - Removed Follow-Up: Dr. Maisie Fushomas. Dr. Sherron MondayMacDiarmid (Urology)    LOS: 24 days    Berna Buehelsea A Deshay Kirstein MD [facility] 04/29/2019, 8:05 AM

## 2019-04-29 NOTE — Progress Notes (Signed)
PHARMACY - ADULT TOTAL PARENTERAL NUTRITION CONSULT NOTE   Pharmacy Consult for TPN Indication: intolerance to enteral feedings, sigmoid diverticulitis, SBO  Patient Measurements: Height: 5\' 4"  (162.6 cm) Weight: 177 lb 14.6 oz (80.7 kg) IBW/kg (Calculated) : 54.7 TPN AdjBW (KG): 61 Body mass index is 30.54 kg/m.  Current Nutrition: NPO/sips of clears, TPN IVF: none Central access:  PICC placed 6/9 TPN start date: 6/9  ASSESSMENT                                                                                                          HPI: 53 yo female with sigmoid diverticulitis with abscesses worsened since admission.  IR placed drain 6/9. Patient has been intolerant to enteral feedings for about a week so plan to place PICC line and start TPN per CCS.  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 6/26: cellulitis noted to midline incision; started on Zosyn 6/27: plenty of stool in bag but still w/ nausea, re-clamping NG  Today, 04/29/19 - no new labs, last drawn 6/27  Glucose - at goal < 150, CBGs & SSI d/c'd 6/16; SBGs WNL  Electrolytes - K improved to WNL, others stable WNL  Renal - SCr, bicarb WNL; UOP adequate; BUN elevated but stable (did receive decadron postop)  I/O - NG output worsened yesterday  LFTs - enzymes mildly elevated and improving  TGs - stable WNL  Prealbumin - low but improved, most recent 15.2  NUTRITIONAL GOALS                                                                                             RD recs (6/22): Kcal/day: [id number], protein/day: 95-105g  Custom TPN to provide 99g protein per day and 1915 kcal/day at goal rate of 75 ml/hr (96% of estimated needs per RD)  PLAN                                                                                                                          At 1800 today:  Continue custom TPN at goal rate of 75 ml/hr  Electrolytes: unchanged from yesterday; Cl:Ac  =  max Ac  Requiring max K in TPN just to maintain; replete outside TPN if needed  TPN to contain standard multivitamins daily and trace elements on MWF due to national shortage  IVF per MD (currently none)  TPN lab panels on Mondays & Thursdays  Reuel Boom, PharmD, BCPS [phone] 04/29/2019, 7:48 AM

## 2019-04-29 NOTE — Plan of Care (Signed)
  Problem: Activity: Goal: Risk for activity intolerance will decrease Outcome: Progressing   Problem: Nutrition: Goal: Adequate nutrition will be maintained Outcome: Progressing   Problem: Pain Managment: Goal: General experience of comfort will improve Outcome: Progressing   

## 2019-04-29 NOTE — Plan of Care (Signed)
  Problem: Activity: Goal: Risk for activity intolerance will decrease Outcome: Progressing   Problem: Pain Managment: Goal: General experience of comfort will improve Outcome: Progressing   NG tube removed by NP this am.  Patient tolerating this fairly well, but with a degree of anxiety.

## 2019-04-30 LAB — COMPREHENSIVE METABOLIC PANEL WITH GFR
ALT: 75 U/L — ABNORMAL HIGH (ref 0–44)
AST: 35 U/L (ref 15–41)
Albumin: 2.5 g/dL — ABNORMAL LOW (ref 3.5–5.0)
Alkaline Phosphatase: 165 U/L — ABNORMAL HIGH (ref 38–126)
Anion gap: 8 (ref 5–15)
BUN: 28 mg/dL — ABNORMAL HIGH (ref 6–20)
CO2: 25 mmol/L (ref 22–32)
Calcium: 8.2 mg/dL — ABNORMAL LOW (ref 8.9–10.3)
Chloride: 103 mmol/L (ref 98–111)
Creatinine, Ser: 0.54 mg/dL (ref 0.44–1.00)
GFR calc Af Amer: >60 mL/min
GFR calc non Af Amer: >60 mL/min
Glucose, Bld: 118 mg/dL — ABNORMAL HIGH (ref 70–99)
Potassium: 4 mmol/L (ref 3.5–5.1)
Sodium: 136 mmol/L (ref 135–145)
Total Bilirubin: 0.3 mg/dL (ref 0.3–1.2)
Total Protein: 6 g/dL — ABNORMAL LOW (ref 6.5–8.1)

## 2019-04-30 LAB — PHOSPHORUS: Phosphorus: 3.8 mg/dL (ref 2.5–4.6)

## 2019-04-30 LAB — CBC
HCT: 26.7 % — ABNORMAL LOW (ref 36.0–46.0)
Hemoglobin: 8.3 g/dL — ABNORMAL LOW (ref 12.0–15.0)
MCH: 30.1 pg (ref 26.0–34.0)
MCHC: 31.1 g/dL (ref 30.0–36.0)
MCV: 96.7 fL (ref 80.0–100.0)
Platelets: 330 K/uL (ref 150–400)
RBC: 2.76 MIL/uL — ABNORMAL LOW (ref 3.87–5.11)
RDW: 14.5 % (ref 11.5–15.5)
WBC: 4.2 K/uL (ref 4.0–10.5)
nRBC: 0 % (ref 0.0–0.2)

## 2019-04-30 LAB — DIFFERENTIAL
Abs Immature Granulocytes: 0.03 K/uL (ref 0.00–0.07)
Basophils Absolute: 0 K/uL (ref 0.0–0.1)
Basophils Relative: 1 %
Eosinophils Absolute: 0.3 K/uL (ref 0.0–0.5)
Eosinophils Relative: 6 %
Immature Granulocytes: 1 %
Lymphocytes Relative: 21 %
Lymphs Abs: 0.9 K/uL (ref 0.7–4.0)
Monocytes Absolute: 0.4 K/uL (ref 0.1–1.0)
Monocytes Relative: 10 %
Neutro Abs: 2.6 K/uL (ref 1.7–7.7)
Neutrophils Relative %: 61 %

## 2019-04-30 LAB — TRIGLYCERIDES: Triglycerides: 96 mg/dL

## 2019-04-30 LAB — MAGNESIUM: Magnesium: 1.9 mg/dL (ref 1.7–2.4)

## 2019-04-30 LAB — PREALBUMIN: Prealbumin: 30.2 mg/dL (ref 18–38)

## 2019-04-30 MED ORDER — ADULT MULTIVITAMIN W/MINERALS CH
1.0000 | ORAL_TABLET | Freq: Every day | ORAL | Status: DC
  Administered 2019-04-30 – 2019-05-02 (×3): 1 via ORAL
  Filled 2019-04-30 (×3): qty 1

## 2019-04-30 MED ORDER — PROCHLORPERAZINE EDISYLATE 10 MG/2ML IJ SOLN: 10.0000 mg | Freq: Four times a day (QID) | INTRAMUSCULAR | Status: DC | PRN

## 2019-04-30 MED ORDER — ENSURE ENLIVE PO LIQD
237.0000 mL | ORAL | Status: DC
  Administered 2019-05-01 – 2019-05-02 (×2): 237 mL via ORAL

## 2019-04-30 MED ORDER — PANTOPRAZOLE SODIUM 40 MG PO TBEC
40.0000 mg | DELAYED_RELEASE_TABLET | Freq: Every day | ORAL | Status: DC
  Administered 2019-04-30 – 2019-05-01 (×2): 40 mg via ORAL
  Filled 2019-04-30 (×2): qty 1

## 2019-04-30 MED ORDER — METHOCARBAMOL 500 MG PO TABS
1000.0000 mg | ORAL_TABLET | Freq: Three times a day (TID) | ORAL | Status: DC
  Administered 2019-04-30 – 2019-05-02 (×7): 1000 mg via ORAL
  Filled 2019-04-30 (×7): qty 2

## 2019-04-30 MED ORDER — TRAMADOL HCL 50 MG PO TABS
50.0000 mg | ORAL_TABLET | Freq: Four times a day (QID) | ORAL | Status: DC | PRN
  Administered 2019-04-30: 50 mg via ORAL
  Filled 2019-04-30: qty 1

## 2019-04-30 MED ORDER — GUAIFENESIN ER 600 MG PO TB12: 600.0000 mg | ORAL_TABLET | Freq: Two times a day (BID) | ORAL | Status: DC | PRN

## 2019-04-30 MED ORDER — TRAVASOL 10 % IV SOLN
INTRAVENOUS | Status: AC
  Administered 2019-04-30: 18:00:00 via INTRAVENOUS
  Filled 2019-04-30: qty 528

## 2019-04-30 MED ORDER — METOCLOPRAMIDE HCL 5 MG/ML IJ SOLN
5.0000 mg | Freq: Four times a day (QID) | INTRAMUSCULAR | Status: DC
  Administered 2019-04-30 – 2019-05-01 (×3): 5 mg via INTRAVENOUS
  Filled 2019-04-30 (×2): qty 2

## 2019-04-30 NOTE — Progress Notes (Signed)
PHARMACY - ADULT TOTAL PARENTERAL NUTRITION CONSULT NOTE   Pharmacy Consult for TPN Indication: intolerance to enteral feedings, sigmoid diverticulitis, SBO  Patient Measurements: Height: 5\' 4"  (162.6 cm) Weight: 177 lb 14.6 oz (80.7 kg) IBW/kg (Calculated) : 54.7 TPN AdjBW (KG): 61 Body mass index is 30.54 kg/m.  Current Nutrition: NPO/sips of clears, TPN IVF: none Central access:  PICC placed 6/9 TPN start date: 6/9  ASSESSMENT                                                                                                          HPI: 53 yo female with sigmoid diverticulitis with abscesses worsened since admission.  IR placed drain 6/9. Patient has been intolerant to enteral feedings for about a week so plan to place PICC line and start TPN per CCS.  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 6/26: cellulitis noted to midline incision; started on Zosyn 6/27: plenty of stool in bag but still w/ nausea, re-clamping NG 6/29 cut TPN rate to 1/2 per CCS, to DC when adv to soft  Today, 04/30/19 - no new labs, last drawn 6/27  Glucose - at goal < 150, CBGs & SSI d/c'd 6/16; CBGs WNL  Electrolytes -  stable WNL  Renal - SCr, bicarb WNL; UOP adequate; BUN elevated but stable   LFTs - enzymes mildly elevated  TGs - stable WNL  Prealbumin - 15.2 >> 30.2  Now WNL   NUTRITIONAL GOALS                                                                                             RD recs (6/22): Kcal/day: 9629-5284, protein/day: 95-105g  Custom TPN to provide 99g protein per day and 1915 kcal/day at goal rate of 75 ml/hr (96% of estimated needs per RD)  PLAN                                                                                                                          At 1800 today:  Cut custom TPN to 40 ml/hr per CCS request to 1/2 rate  Electrolytes: unchanged from yesterday; Cl:Ac =  max Ac  Requiring max K in TPN just to  maintain; replete outside TPN if needed  Change to PO MVI, changed IV PPI to PO  IVF per MD (currently none)  TPN lab panels on Mondays & Thursdays  Consider changing reglan and cipro to PO when able  Herby AbrahamMichelle T. Shariff Lasky, Pharm.D [phone] 04/30/2019 8:44 AM

## 2019-04-30 NOTE — Progress Notes (Signed)
Physical Therapy Treatment Patient Details Name: Molly Beck MRN: [id number] DOB: 1966-09-06 Today's Date: 04/30/2019    History of Present Illness Pt admitted with Colitis/sepsis and is s/p R foot reconstructive surgery. s/p placement of 2 drains 04/10/19. She is now s/p EXPLORATORY LAPAROTOMY, HARTMAN'S PROCEDURE, SMALL BOWEL RESECTION and CYSTOSCOPY WITH STENT PLACEMENT (04/18/19)    PT Comments    Progressing with mobility. Pain seemed to be better controlled on today. She wants to get better and get home as soon as she can. Will continue to follow.    Follow Up Recommendations  No PT follow up     Equipment Recommendations  None recommended by PT    Recommendations for Other Services       Precautions / Restrictions Precautions Precautions: Fall Precaution Comments: colostomy, R abd drain Other Brace: abdominal binder Restrictions Weight Bearing Restrictions: Yes RLE Weight Bearing: Non weight bearing    Mobility  Bed Mobility               General bed mobility comments: oob in recliner  Transfers Overall transfer level: Needs assistance Equipment used: None Transfers: Sit to/from Stand Sit to Stand: Supervision Stand pivot transfers: Min guard       General transfer comment: Supervision to stand, Min guard for pivot to bsc<>recliner.  Ambulation/Gait Ambulation/Gait assistance: Min guard Gait Distance (Feet): 500 Feet Assistive device: (knee scooter)       General Gait Details: Several rest breaks 2* fatigue, dyspnea. Pt tends to flex trunk and rest arms on handle bars when fatigued.   Stairs             Wheelchair Mobility    Modified Rankin (Stroke Patients Only)       Balance                                            Cognition Arousal/Alertness: Awake/alert Behavior During Therapy: WFL for tasks assessed/performed Overall Cognitive Status: Within Functional Limits for tasks assessed                                         Exercises      General Comments        Pertinent Vitals/Pain Pain Assessment: 0-10 Pain Score: 5  Pain Location: abdomen Pain Descriptors / Indicators: Sore;Tightness;Throbbing Pain Intervention(s): Monitored during session    Home Living                      Prior Function            PT Goals (current goals can now be found in the care plan section) Progress towards PT goals: Progressing toward goals    Frequency    Min 3X/week      PT Plan Current plan remains appropriate    Co-evaluation              AM-PAC PT "6 Clicks" Mobility   Outcome Measure  Help needed turning from your back to your side while in a flat bed without using bedrails?: A Little Help needed moving from lying on your back to sitting on the side of a flat bed without using bedrails?: A Little Help needed moving to and from a bed to a chair (including a wheelchair)?: A  Little Help needed standing up from a chair using your arms (e.g., wheelchair or bedside chair)?: A Little Help needed to walk in hospital room?: A Little Help needed climbing 3-5 steps with a railing? : Total 6 Click Score: 16    End of Session   Activity Tolerance: Patient limited by fatigue Patient left: in chair;with call bell/phone within reach   PT Visit Diagnosis: Difficulty in walking, not elsewhere classified (R26.2)     Time: [id number] PT Time Calculation (min) (ACUTE ONLY): 24 min  Charges:  $Gait Training: 8-22 mins                        Rebeca AlertJannie Venise Ellingwood, PT Acute Rehabilitation Services Pager: [phone] Office: [phone]

## 2019-04-30 NOTE — Progress Notes (Signed)
[facility] Progress Note  12 Days Post-Op  Subjective: CC: phlegm in chest Patient complaining of some chest congestion that she feels like she needs to cough up. Some abdominal pain with dressing changes. Tolerating CLD and having stool output.   Objective: Vital signs in last 24 hours: Temp:  [98.1 F (36.7 C)-99 F (37.2 C)] 98.7 F (37.1 C) (06/29 0518) Pulse Rate:  [95-109] 95 (06/29 0518) Resp:  [16-18] 16 (06/29 0518) BP: (93-108)/(61-74) 108/74 (06/29 0518) SpO2:  [96 %-98 %] 97 % (06/29 0518) Last BM Date: 04/29/19  Intake/Output from previous day: 06/28 0701 - 06/29 0700 In: 3634.3 [P.O.:1240; I.V.:1744.3; IV Piggyback:650] Out: 1010 [Urine:900; Drains:10; Stool:100] Intake/Output this shift: No intake/output data recorded.  PE: Gen:  Alert, NAD, pleasant Card:  Regular rate and rhythm Pulm:  Normal effort, clear to auscultation bilaterally Abd: Soft, appropriately tender, non-distended, +BS, midline wound with beefy red granulation and scant drainage, stoma pink with soft brown stool output, drain in RLQ with minimal SS drainage Skin: warm and dry, no rashes  Psych: A&Ox3   Lab Results:  Recent Labs    04/30/19 0510  WBC 4.2  HGB 8.3*  HCT 26.7*  PLT 330   BMET Recent Labs    04/28/19 0415 04/30/19 0510  NA 141 136  K 4.2 4.0  CL 109 103  CO2 26 25  GLUCOSE 133* 118*  BUN 31* 28*  CREATININE 0.54 0.54  CALCIUM 8.5* 8.2*   PT/INR No results for input(s): LABPROT, INR in the last 72 hours. CMP     Component Value Date/Time   NA 136 04/30/2019 0510   K 4.0 04/30/2019 0510   CL 103 04/30/2019 0510   CO2 25 04/30/2019 0510   GLUCOSE 118 (H) 04/30/2019 0510   BUN 28 (H) 04/30/2019 0510   CREATININE 0.54 04/30/2019 0510   CALCIUM 8.2 (L) 04/30/2019 0510   PROT 6.0 (L) 04/30/2019 0510   ALBUMIN 2.5 (L) 04/30/2019 0510   AST 35 04/30/2019 0510   ALT 75 (H) 04/30/2019 0510   ALKPHOS 165 (H) 04/30/2019 0510   BILITOT 0.3  04/30/2019 0510   GFRNONAA >60 04/30/2019 0510   GFRAA >60 04/30/2019 0510   Lipase     Component Value Date/Time   LIPASE 25 09/12/2015 1615       Studies/Results: No results found.  Anti-infectives: Anti-infectives (From admission, onward)   Start     Dose/Rate Route Frequency Ordered Stop   04/29/19 0900  ciprofloxacin (CIPRO) IVPB 400 mg     400 mg 200 mL/hr over 60 Minutes Intravenous Every 12 hours 04/29/19 0827     04/28/19 1800  ceFEPIme (MAXIPIME) 2 g in sodium chloride 0.9 % 100 mL IVPB  Status:  Discontinued     2 g 200 mL/hr over 30 Minutes Intravenous Every 8 hours 04/28/19 1720 04/29/19 0804   04/23/19 0200  piperacillin-tazobactam (ZOSYN) IVPB 3.375 g     3.375 g 12.5 mL/hr over 240 Minutes Intravenous Every 8 hours 04/23/19 0144 04/23/19 0832   04/16/19 0600  cefoTEtan (CEFOTAN) 2 g in sodium chloride 0.9 % 100 mL IVPB     2 g 200 mL/hr over 30 Minutes Intravenous On call to O.R. 04/15/19 1505 04/17/19 0559   04/05/19 0600  piperacillin-tazobactam (ZOSYN) IVPB 3.375 g  Status:  Discontinued     3.375 g 12.5 mL/hr over 240 Minutes Intravenous Every 8 hours 04/05/19 0121 04/23/19 0144   04/05/19 0000  piperacillin-tazobactam (ZOSYN) IVPB 3.375 g  3.375 g 100 mL/hr over 30 Minutes Intravenous  Once 04/04/19 2355 04/05/19 0119       Assessment/Plan GERD Osteoarthritis - takes meloxicam and tramadol at baseline RecentR flatfoot reconstruction and lapidus bunion correctionsurgery by Dr. Hewitt5/26/2020- per note on 6/15 "patient is fine to stay in her post-op splint until she is D/C'd from the hospital" & "stitches can stay in without causing any type of difficulty in regards to her wound healing"  Protein Calorie Malnutrition - prealbumin 30.2 this AM, improving Anemia -Hgb 8.3, stable Tachycardia -HR ~100's since admission. EKG 6/23 with sinus tachycardia. No hypotension or hypoxia. Denies SOB.No LLE calf tenderness or edema.   PERFORATION OF  SMALL BOWEL BY CT GUIDED DRAIN PERFORATED DIVERTICULITIS -s/p Exploratory laparotomy, Hartman's procedure, small bowel resection - Dr. Maisie Fushomas - 04/18/2019 - POD #12 - Cystoscopy with Stent placement - Dr. Sherron MondayMacDiarmid - 6/17 - Foley out, voiding on own. Ureteral stents per Urology.UA without definitive UTI. Urine cx w/ <100,000 colonies/ml. Started on cipro for UTI, defer to urology -Continues to have ostomy output, nausea improved. Likely gastric ileus,continue scheduled Reglan. - advance to FLD  - drain with minimal SS output, may be able to remove prior to d/c - Surgical pathology benign. Diverticular disease with transmural defect. One benign lymph node, serosal adhesions, margins histologically viable, no evidence of malignancy - Wound infection improving, cefepime discontinued yesterday  FEN - FLD, 1/2 TPN  VTE -SCDs, Lovenox ID -[id number] - 6/22; cefepime 6/27-6/28; IV cipro 6/28>> Foley - Removed Follow-Up: Dr. Maisie Fushomas, Dr. Sherron MondayMacDiarmid   LOS: 25 days    Wells GuilesKelly Rayburn , [facility] 04/30/2019, 7:46 AM Pager: [phone] Consults: [phone]

## 2019-04-30 NOTE — Progress Notes (Signed)
PROGRESS NOTE    Molly ReiningCarol T Beck  ZOX:[id number] DOB: [birth year] DOA: 04/04/2019 PCP: Clementeen GrahamScifres, [name], [name]   Brief Narrative:  Molly CandleCarol T Liberatoreis a 53 y.o.femalewith medical history significant ofGERD, elective surgery of her right foot a week ago presenting to the hospital for evaluation of abdominal pain.Patient states she had areconstructive right foot surgery done a week ago. States since then she has been constipated due to taking pain medications. Her last bowel movement was prior to the surgery. For the past few days she is having left lower quadrant abdominal pain which became worse for the past 24 hours. The pain is sharp and excruciating. States today she called her surgeon's office and was advised to take magnesium citrate for constipation. She vomited soon after taking magnesium citrate. Denies any fevers. She continues to complain of abdominal pain despite receiving a dose ofmorphine and multiple doses of fentanyl in the ED. CT abdomen pelvis showing acute sigmoid colitis with small foci of adjacent gas that may be extra luminal and may indicate microperforation. No abscess or drainable fluid collection. She was admitted for further treatment and care. General surgery was consulted.  **Interim History Patient with improved abdominal discomfort overnight following insertion of NG tube with significant output. Repeat imaging studies suggest continued small bowel obstruction. Intra-abdominal drains continue to drain well. Patient is continued on TPN because of lack of progression and because of concern for bilious drainage she underwent surgical intervention on 04/18/2019 and urology was consulted for bilateral stent placement and cystoscopy. General surgery did exploratory laparotomy with small bowel resection and Hartman's procedure along with urology doing a cystoscopy with bilateral stent placement. Today patient was in a lot of pain so surgery was admitted  Dilaudid PCA. Patient was complaining of significant pressure in her bladder felt that she had a urinary tract infection however it turned out that her catheter was blocked and she is re-cath and 1 L was removed.   Foley catheter was removed and pain medications are being adjusted and increased. Patient undergoing NG tube clamping and has been ambulating the halls. Better controlled with the PCA Dilaudid. Surgery recommending IV antibiotics for 5 days total Post-op after Surgical Intervention (Today). Will likely need to have her ureteral stenting removed eventually. Continue TNA nutrition for now until bowels are improved continue to mobilize.    Assessment & Plan:   Principal Problem:   Perforated sigmoid colon  Active Problems:   Colitis   Sepsis (HCC)   Hypokalemia   H/O foot surgery   Intraabdominal fluid collection   Encounter for imaging study to confirm nasogastric (NG) tube placement   Small bowel obstruction (HCC)   SBO (small bowel obstruction) (HCC)   Sepsis secondary to Acute Sigmoid Colitis with Microperforation complicated by 2 Absecesses status post drain placement and now exploratory laparotomy with Hartman's procedure a small bowel resection along with cystoscopy and bilateral stent placement SBO Sepsis, present on admission and physiology is improving --Because of minimal improvement with green bile color in anterior abdominal drain, General Surgerydidexploratory laparotomy and patient underwent a Hartman's procedure and small bowel resection; she is postoperative day 12 today -Urology Consulted by General Surgery for Bilateral Renal Stents and Cystoscopy; Foley catheter was replaced and now has been removed and will need her ureteral stenting removed in the outpatient setting -Last SARS-CoV-2 testing was negative on 04/05/2019 however this wasrepeated prior to Surgery -KUB on 04/18/2019 showed "Improving small bowel obstruction. Contrast in the colon now  reaches the rectum. Stable percutaneous  drains in the pelvis."  -Continue supportive care and antiemetics -Pain control per general surgery -continues improving  UTI by UCX; Reviewed cx, given pts stents, and mild tachy, added abx 6/28 for five day course per sensitivities  Sinus Tach: Improving, no need to add additional medication anticipate continued improvement  Rash -Maculopapular rash bilateral distribution on her back, improved  -likleydue to heat/sweat rash or contact from bedsheets.  -Continue IV Benadryl and hydrocortisone cream prn.  -Continue hygiene and change of bed sheets daily  Recent right foot surgery and Tendon Repair -Currently in a cast, plans to follow-up with her orthopedic surgeon as an outpatient -PT following -Continue nonweightbearing right lower extremity -No pain, normal white blood cell count, no fevers, given patient's recent surgery would expect inflammatory markers be elevated so  we will not check  Hypokalemia -K+ was4.0 -Continue to Monitor- pt is on TPN -Repeat CMP in AM  Normocytic Anmeia -Patient's Hgb/Hct stable and isSTABLE, WILL CONTINUE TO MONITOR -Anemia panel done and showed an iron level of 16, U IBC 189, TIBC of 205, saturation ratios of 8%, ferritin level 171, folate level 9.5, and vitamin B12 848  Hyperglycemia -improved,Likely reactive and in the setting of TPN; CBG's ranging from 100-118 -HbA1c was 4.9 on 04/14/2019 -Glucose this a.m. on CMP was 128 -CBG's ranging from88-108  GERD -Continue with PPI IV with Pantoprazole 40 mg q24h until able to take p.o.  Anxiety -Continue with Lorazepam 0.5 mg IV every 6 as needed for Anxiety  Obesity -Estimated body mass index is 30.84 kg/m as calculated from the following: -Weight Loss and Dietary Counseling given   Leukocytosis -In the setting of surgery and reactive -WBCimproved and now7.8 -Continue to monitor for signs and symptoms of infection-none  evident currently -Repeat CBC in a.m.  Abnormal ALT -mild bumpstable,, tb nl,reviewed prior history,neghepatitis panel.  Thrombocytosis  -Likely reactive, currently497and trending down -Monitor with CBC  Hypernatremia, improved -Patient sodium level this morning was 141 -In the setting of TPN administration -Continue monitor and trend and repeat sodium level in a.m.  DVT prophylaxis: Lovenox SQ  Code Status: Full    Code Status Orders  (From admission, onward)         Start     Ordered   04/05/19 0100  Full code  Continuous     04/05/19 0101        Code Status History    This patient has a current code status but no historical code status.   Advance Care Planning Activity     Family Communication: per primary  Disposition Plan:   per primary Consults called: None Admission status: Inpatient   Consultants:   Interventional radiology, general surgery, orthopedic surgery, urology  Procedures:  Ct Abdomen Pelvis W Contrast  Result Date: 04/15/2019 CLINICAL DATA:  Severe lower abdominal pain. Unable to eat or drink. History of diverticulitis with recent abscess. Drain in place. EXAM: CT ABDOMEN AND PELVIS WITH CONTRAST TECHNIQUE: Multidetector CT imaging of the abdomen and pelvis was performed using the standard protocol following bolus administration of intravenous contrast. CONTRAST:  OMNIPAQUE IOHEXOL 300 MG/ML SOLN, 30mL OMNIPAQUE IOHEXOL 300 MG/ML SOLN COMPARISON:  Procedures CT 04/10/2019. CT of the abdomen and pelvis 04/09/2019 FINDINGS: Lower chest: The lung bases are clear without focal nodule, mass, or airspace disease. Small pericardial effusion is similar to prior studies. No pleural effusions are present. Hepatobiliary: No focal liver abnormality is seen. No gallstones, gallbladder wall thickening, or biliary dilatation. Distension of the gallbladder is  likely secondary to fasting. Pancreas: Unremarkable. No pancreatic ductal dilatation or  surrounding inflammatory changes. Spleen: Normal in size without focal abnormality. Adrenals/Urinary Tract: Adrenal glands are normal bilaterally. Kidneys and ureters are unremarkable. There is no nodule or mass lesion. No stone is present. There is no obstruction. Stomach/Bowel: The stomach is moderately distended and fluid filled. There is marked distension of proximal small bowel loops measuring up to 3.8 cm in transverse diameter. Transition point is in the anatomic pelvis adjacent to the more anterior and superior abscess. The distal small bowel is collapsed. Contrast is present in the colon. Gas and stool are present throughout the colon to the rectum. There is a small amount of contrast in the rectum. Inflammatory changes about the sigmoid colon are improving. Vascular/Lymphatic: No significant vascular calcifications are present. Subcentimeter lymph nodes are likely reactive. Reproductive: Uterus and bilateral adnexa are unremarkable. IUD is in place. Other: Anterior and posterior drainage catheters are stable in position. The anterior collection is collapsed. There is an adjacent collection which may be separate from the drain measuring 2.0 x 2.4 x 1.9 cm. The posterior drain is in place. Previously noted inferior pelvic collection is markedly reduced in size. This collection now measures 3.2 x 6.0 x 3.4 cm. There is still gas with a fluid collection. No other focal fluid collections are present. Musculoskeletal: Vertebral body heights and alignment are maintained. No focal lytic or blastic lesions are present. Small Schmorl's nodes are present. Bony pelvis is within normal limits. The hips are located. Severe degenerative changes are again noted in the left hip. More mild degenerative changes are present on the right. IMPRESSION: 1. Dilated small bowel with a transition point in the anatomic pelvis adjacent to the inflammation is consistent with small bowel obstruction. No discrete bowel injury is evident.  2. The anterior abscess cavity has collapsed. 3. Adjacent small fluid collection measures 2.0 x 2.4 x 1.9 cm. 4. Marked reduction in size of the more inferior pelvic collection, now measuring 3.2 x 6.0 x 3.4 cm. Pigtail drainage catheter remains in place. 5. Reduced inflammatory changes surrounding the sigmoid colon. Electronically Signed   By: Marin Robertshristopher  Mattern M.D.   On: 04/15/2019 15:26   Ct Abdomen Pelvis W Contrast  Result Date: 04/09/2019 CLINICAL DATA:  Elevated white blood cell count and patient with abdominal pain for 1 week. EXAM: CT ABDOMEN AND PELVIS WITH CONTRAST TECHNIQUE: Multidetector CT imaging of the abdomen and pelvis was performed using the standard protocol following bolus administration of intravenous contrast. CONTRAST:  100 mL OMNIPAQUE IOHEXOL 300 MG/ML  SOLN COMPARISON:  CT abdomen pelvis 04/04/2019. FINDINGS: Lower chest: Heart size is normal. No pleural or pericardial effusion. Minimal right basilar atelectasis noted. Hepatobiliary: The gallbladder is distended but otherwise unremarkable. The liver and biliary tree appear normal. Pancreas: Unremarkable. No pancreatic ductal dilatation or surrounding inflammatory changes. Spleen: Normal in size without focal abnormality. Adrenals/Urinary Tract: Small cyst lower pole right kidney incidentally noted. The kidneys otherwise appear normal. Ureters and urinary bladder are normal in appearance. Stomach/Bowel: Extensive inflammatory change about the sigmoid colon has markedly worsened. There is an air and fluid collection posterior and to the right of the uterus which measures approximately 6.5 cm transverse by 9 cm AP by 5 cm craniocaudal consistent with an abscess. A second air and fluid collection consistent with abscess more superiorly in the central pelvis measures 4 cm AP by 3.5 cm transverse by 7.5 cm craniocaudal. This collection is contains predominantly gas and it overlies a  loop of small bowel which appears inflamed. Presacral  edema is identified. The walls of the sigmoid colon are markedly thickened inflamed. The colon otherwise appears normal. The stomach, small bowel and appendix are normal in appearance. Vascular/Lymphatic: No significant vascular findings are present. No enlarged abdominal or pelvic lymph nodes. Reproductive: Uterus and bilateral adnexa are unremarkable. IUD noted. Other: None. Musculoskeletal: No acute or focal bony abnormality. Severe left hip osteoarthritis noted. IMPRESSION: Marked worsening findings in the pelvis most consistent with acute sigmoid diverticulitis with 2 new abscesses identified as described above. These results were called by telephone at the time of interpretation on 04/09/2019 at 2:03 pm to [facility] Who verbally acknowledged these results. Electronically Signed   By: Drusilla Kanner M.D.   On: 04/09/2019 14:07   Ct Abdomen Pelvis W Contrast  Result Date: 04/04/2019 CLINICAL DATA:  Abdominal pain and decreased bowel movement since surgery EXAM: CT ABDOMEN AND PELVIS WITH CONTRAST TECHNIQUE: Multidetector CT imaging of the abdomen and pelvis was performed using the standard protocol following bolus administration of intravenous contrast. CONTRAST:  OMNIPAQUE IOHEXOL 300 MG/ML  SOLN COMPARISON:  None. FINDINGS: LOWER CHEST: There is no basilar pleural or apical pericardial effusion. HEPATOBILIARY: The hepatic contours and density are normal. There is no intra- or extrahepatic biliary dilatation. The gallbladder is normal. PANCREAS: The pancreatic parenchymal contours are normal and there is no ductal dilatation. There is no peripancreatic fluid collection. SPLEEN: Normal. ADRENALS/URINARY TRACT: --Adrenal glands: Normal. --Right kidney/ureter: No hydronephrosis, nephroureterolithiasis, perinephric stranding or solid renal mass. --Left kidney/ureter: No hydronephrosis, nephroureterolithiasis, perinephric stranding or solid renal mass. --Urinary bladder: Normal for degree of  distention STOMACH/BOWEL: --Stomach/Duodenum: There is no hiatal hernia or other gastric abnormality. The duodenal course and caliber are normal. --Small bowel: No dilatation or inflammation. --Colon: There is mild inflammation of the sigmoid colon. There are small foci of gas along the anterior contour of the distal sigmoid colon that may indicate microperforation (coronal images 43, 46, 47). Large amount of colonic stool. --Appendix: Not clearly visualized. VASCULAR/LYMPHATIC: Normal course and caliber of the major abdominal vessels. No abdominal or pelvic lymphadenopathy. REPRODUCTIVE: There is a T-shaped contraceptive device within the uterus. MUSCULOSKELETAL. No bony spinal canal stenosis or focal osseous abnormality. OTHER: None. IMPRESSION: Acute sigmoid colitis with small foci of adjacent gas that may be extraluminal. This may indicate micro perforation. No abscess or drainable fluid collection. Electronically Signed   By: Deatra Robinson M.D.   On: 04/04/2019 23:32   Dg Abd Portable 1v  Result Date: 04/18/2019 CLINICAL DATA:  Small bowel obstruction. EXAM: PORTABLE ABDOMEN - 1 VIEW COMPARISON:  April 17, 2019 FINDINGS: Two drains are seen in the pelvis, in stable position. Contrast is seen in the colon, extending into the rectum. Few mildly prominent loops of small bowel remain in the central abdomen measuring up to 3.3 cm. The more dilated loops superiorly is decreased in caliber. No free air, portal venous gas, or pneumatosis. IMPRESSION: Improving small bowel obstruction. Contrast in the colon now reaches the rectum. Stable percutaneous drains in the pelvis. Electronically Signed   By: Gerome Sam III M.D   On: 04/18/2019 08:12   Dg Abd Portable 1v  Result Date: 04/17/2019 CLINICAL DATA:  Small bowel obstruction. EXAM: PORTABLE ABDOMEN - 1 VIEW COMPARISON:  One-view abdomen 04/16/2019 FINDINGS: Dilated loops of small bowel in the upper abdomen is similar the prior study. More distal loops  demonstrate some decompression appear to the most recent x-ray. Surgical drains remain  in place. No definite free air is present. Contrast is again noted in the colon. IMPRESSION: 1. Similar dilation of transverse loop of small bowel in the upper abdomen. 2. The remainder of the small bowel has begun to decompressed. 3. Drainage catheters are stable. Electronically Signed   By: Marin Roberts M.D.   On: 04/17/2019 10:38   Dg Abd Portable 1v-small Bowel Obstruction Protocol-initial, 8 Hr Delay  Result Date: 04/16/2019 CLINICAL DATA:  Small bowel obstruction. 8 hour delayed film. EXAM: PORTABLE ABDOMEN - 1 VIEW COMPARISON:  CT yesterday. FINDINGS: Enteric contrast in the colon, however unclear if this contrast was from prior CT or administered for small bowel protocol. There is residual contrast in the stomach. Persistent dilated small bowel in the central abdomen a 5.1 cm. Two drainage catheters overlie the pelvis. Advanced left hip osteoarthritis. IMPRESSION: Enteric contrast in the colon, however it is unclear if this is residual contrast that was seen on CT yesterday versus that administered for the small-bowel protocol. There is residual contrast in the stomach and persistent dilated small bowel in the central abdomen. Recommend 24 hour delayed film. Electronically Signed   By: Narda Rutherford M.D.   On: 04/16/2019 23:43   Dg Abd Portable 1v-small Bowel Protocol-position Verification  Result Date: 04/16/2019 CLINICAL DATA:  NG tube placement. EXAM: PORTABLE ABDOMEN - 1 VIEW COMPARISON:  Abdominal CT 04/15/2019 FINDINGS: Enteric catheter projects over the expected location of gastric cardia. Persistent small bowel loop dilation with maximum diameter of 5.4 cm. No free intra-abdominal gas is seen. IMPRESSION: 1. Enteric catheter projects over the expected location of gastric cardia. 2. Persistent small bowel obstruction. Electronically Signed   By: Ted Mcalpine M.D.   On: 04/16/2019 13:20    Dg C-arm 1-60 Min-no Report  Result Date: 04/18/2019 Fluoroscopy was utilized by the requesting physician.  No radiographic interpretation.   Ct Image Guided Drainage By Percutaneous Catheter  Result Date: 04/10/2019 CLINICAL DATA:  Sigmoid diverticulitis with rupture and abscesses in the central upper pelvis and deep lower posterior pelvis. EXAM: 1. CT GUIDED CATHETER DRAINAGE OF PERITONEAL DIVERTICULAR ABSCESS 2. CT-GUIDED CATHETER DRAINAGE OF PELVIC PERITONEAL ABSCESS ANESTHESIA/SEDATION: 4.0 mg IV Versed 100 mcg IV Fentanyl Total Moderate Sedation Time:  52 minutes The patient's level of consciousness and physiologic status were continuously monitored during the procedure by Radiology nursing. PROCEDURE: The procedure, risks, benefits, and alternatives were explained to the patient. Questions regarding the procedure were encouraged and answered. The patient understands and consents to the procedure. A time out was performed prior to initiating the procedure. The anterior abdominal wall followed by the right transgluteal region were prepped with chlorhexidine in a sterile fashion, and a sterile drape was applied covering the operative field. A sterile gown and sterile gloves were used for the procedure. Local anesthesia was provided with 1% Lidocaine. An 18 gauge trocar needle was advanced from a right anterior approach to the level of a sigmoid diverticular abscess. After confirming needle tip position, aspiration was performed. A guidewire was advanced and the needle removed. The tract was dilated over the wire. A 10 French percutaneous drainage catheter was then advanced over the wire and formed. Catheter position was confirmed by CT. Additional fluid sample was aspirated from the drain. The drainage catheter was then flushed with saline and attached to a suction bulb. A Prolene retention suture was applied at the skin exit site. The patient was then placed in a prone position and from a right  transgluteal approach, an 33  gauge trocar needle was advanced to the level a second posterior lower pelvic abscess abscess. After confirming needle tip position, aspiration was performed. A guidewire was advanced and the needle removed. The tract was dilated over the wire. A 10 French percutaneous drainage catheter was then advanced over the wire and formed. Catheter position was confirmed by CT. Additional fluid sample was aspirated from the drain. The drainage catheter was then flushed with saline and attached to a suction bulb. A Prolene retention suture was applied at the skin exit site. COMPLICATIONS: None FINDINGS: There was an anterior approach available to the level of the central lower abdominal sigmoid diverticular abscess. Aspiration yielded feculent material. Additional aspiration via a drainage catheter yielded purulent and feculent fluid. A 10 French drain was formed in the abscess cavity. Persistent posterior pelvic abscess was able to be accessed from a right transgluteal approach. Aspiration yielded feculent and purulent fluid. A 10 French drain was formed in the abscess. IMPRESSION: CT-guided percutaneous drainage of 2 separate peritoneal abscess collections related to ruptured sigmoid diverticulitis. A 10 French drain was placed from an anterior approach into the sigmoid diverticular abscess. A second 10 French drain was placed from a posterior approach into the posterior deep pelvic abscess. Both drains were attached to suction bulbs. Electronically Signed   By: Irish Lack M.D.   On: 04/10/2019 16:41   Ct Image Guided Drainage By Percutaneous Catheter  Result Date: 04/10/2019 CLINICAL DATA:  Sigmoid diverticulitis with rupture and abscesses in the central upper pelvis and deep lower posterior pelvis. EXAM: 1. CT GUIDED CATHETER DRAINAGE OF PERITONEAL DIVERTICULAR ABSCESS 2. CT-GUIDED CATHETER DRAINAGE OF PELVIC PERITONEAL ABSCESS ANESTHESIA/SEDATION: 4.0 mg IV Versed 100 mcg IV Fentanyl  Total Moderate Sedation Time:  52 minutes The patient's level of consciousness and physiologic status were continuously monitored during the procedure by Radiology nursing. PROCEDURE: The procedure, risks, benefits, and alternatives were explained to the patient. Questions regarding the procedure were encouraged and answered. The patient understands and consents to the procedure. A time out was performed prior to initiating the procedure. The anterior abdominal wall followed by the right transgluteal region were prepped with chlorhexidine in a sterile fashion, and a sterile drape was applied covering the operative field. A sterile gown and sterile gloves were used for the procedure. Local anesthesia was provided with 1% Lidocaine. An 18 gauge trocar needle was advanced from a right anterior approach to the level of a sigmoid diverticular abscess. After confirming needle tip position, aspiration was performed. A guidewire was advanced and the needle removed. The tract was dilated over the wire. A 10 French percutaneous drainage catheter was then advanced over the wire and formed. Catheter position was confirmed by CT. Additional fluid sample was aspirated from the drain. The drainage catheter was then flushed with saline and attached to a suction bulb. A Prolene retention suture was applied at the skin exit site. The patient was then placed in a prone position and from a right transgluteal approach, an 18 gauge trocar needle was advanced to the level a second posterior lower pelvic abscess abscess. After confirming needle tip position, aspiration was performed. A guidewire was advanced and the needle removed. The tract was dilated over the wire. A 10 French percutaneous drainage catheter was then advanced over the wire and formed. Catheter position was confirmed by CT. Additional fluid sample was aspirated from the drain. The drainage catheter was then flushed with saline and attached to a suction bulb. A Prolene  retention suture was  applied at the skin exit site. COMPLICATIONS: None FINDINGS: There was an anterior approach available to the level of the central lower abdominal sigmoid diverticular abscess. Aspiration yielded feculent material. Additional aspiration via a drainage catheter yielded purulent and feculent fluid. A 10 French drain was formed in the abscess cavity. Persistent posterior pelvic abscess was able to be accessed from a right transgluteal approach. Aspiration yielded feculent and purulent fluid. A 10 French drain was formed in the abscess. IMPRESSION: CT-guided percutaneous drainage of 2 separate peritoneal abscess collections related to ruptured sigmoid diverticulitis. A 10 French drain was placed from an anterior approach into the sigmoid diverticular abscess. A second 10 French drain was placed from a posterior approach into the posterior deep pelvic abscess. Both drains were attached to suction bulbs. Electronically Signed   By: Aletta Edouard M.D.   On: 04/10/2019 16:41   [place] Ekg Site Rite  Result Date: 04/10/2019 If Site Rite image not attached, placement could not be confirmed due to current cardiac rhythm.    Antimicrobials:   cipro 2/5    Subjective: Continues improving, Bedside chair, tolerating diet   Objective: Vitals:   04/29/19 1401 04/29/19 1756 04/29/19 2058 04/30/19 0518  BP: 103/73 93/61 101/62 108/74  Pulse: (!) 102 (!) 109 (!) 102 95  Resp: 16 16 18 16   Temp: 98.1 F (36.7 C) 99 F (37.2 C) 98.3 F (36.8 C) 98.7 F (37.1 C)  TempSrc: Oral Oral Oral Oral  SpO2: 98% 97% 96% 97%  Weight:      Height:        Intake/Output Summary (Last 24 hours) at 04/30/2019 1323 Last data filed at 04/30/2019 0918 Gross per 24 hour  Intake 3434.34 ml  Output 1370 ml  Net 2064.34 ml   Filed Weights   04/16/19 1700 04/17/19 1050 04/24/19 1233  Weight: 79.7 kg 81.5 kg 80.7 kg    Examination: General exam: Appears calm and comfortable  Respiratory system: Clear  to auscultation. Respiratory effort normal. Cardiovascular system: S1 & S2 heard, RRR. No JVD, murmurs, rubs, gallops or clicks. No pedal edema. Gastrointestinal system: Soft, ND.Midline wound open with healthy granulation tissue.There is no cellulitis or active infection on exam.Colostomy bag in place,with large amount of brown soft stool in bag, .drain in RLQ no signs of infection Central nervous system: Alert and oriented. No focal neurological deficits. Extremities: Warm well perfused cast right lower extremity, no pain Skin: No rashes, lesions or ulcers Psychiatry: Judgement and insight appear normal. Mood & affect appropriate.    Data Reviewed: I have personally reviewed following labs and imaging studies  CBC: Recent Labs  Lab 04/24/19 0436 04/26/19 1135 04/27/19 0403 04/30/19 0510  WBC 5.8 6.9 7.8 4.2  NEUTROABS 4.2 5.4  --  2.6  HGB 7.9* 8.5* 8.6* 8.3*  HCT 25.2* 26.1* 28.0* 26.7*  MCV 98.1 102.8* 97.2 96.7  PLT 513* 490* 497* 250   Basic Metabolic Panel: Recent Labs  Lab 04/24/19 0436 04/25/19 0420 04/26/19 0336 04/27/19 0403 04/28/19 0415 04/30/19 0510  NA 145 145 146* 143 141 136  K 3.9 3.7 3.8 3.6 4.2 4.0  CL 113* 114* 113* 110 109 103  CO2 24 23 24 25 26 25   GLUCOSE 118* 122* 117* 109* 133* 118*  BUN 28* 32* 30* 29* 31* 28*  CREATININE 0.53 0.47 0.54 0.49 0.54 0.54  CALCIUM 8.7* 8.6* 8.6* 8.6* 8.5* 8.2*  MG 2.0  --  2.0  --  2.0 1.9  PHOS 3.4 3.4 3.9  --   --  3.8   GFR: Estimated Creatinine Clearance: 84.5 mL/min (by C-G formula based on SCr of 0.54 mg/dL). Liver Function Tests: Recent Labs  Lab 04/24/19 0436 04/26/19 0336 04/27/19 0403 04/28/19 0415 04/30/19 0510  AST 22 61* 47* 37 35  ALT 30 90* 91* 83* 75*  ALKPHOS 117 145* 161* 149* 165*  BILITOT 0.6 0.4 0.7 0.4 0.3  PROT 6.3* 6.3* 6.8 6.4* 6.0*  ALBUMIN 2.5* 2.6* 2.8* 2.6* 2.5*   No results for input(s): LIPASE, AMYLASE in the last 168 hours. No results for input(s): AMMONIA in  the last 168 hours. Coagulation Profile: No results for input(s): INR, PROTIME in the last 168 hours. Cardiac Enzymes: No results for input(s): CKTOTAL, CKMB, CKMBINDEX, TROPONINI in the last 168 hours. BNP (last 3 results) No results for input(s): PROBNP in the last 8760 hours. HbA1C: No results for input(s): HGBA1C in the last 72 hours. CBG: No results for input(s): GLUCAP in the last 168 hours. Lipid Profile: Recent Labs    04/30/19 0510  TRIG 96   Thyroid Function Tests: No results for input(s): TSH, T4TOTAL, FREET4, T3FREE, THYROIDAB in the last 72 hours. Anemia Panel: No results for input(s): VITAMINB12, FOLATE, FERRITIN, TIBC, IRON, RETICCTPCT in the last 72 hours. Sepsis Labs: No results for input(s): PROCALCITON, LATICACIDVEN in the last 168 hours.  Recent Results (from the past 240 hour(s))  Culture, Urine     Status: Abnormal   Collection Time: 04/26/19  9:45 AM   Specimen: Urine, Random  Result Value Ref Range Status   Specimen Description   Final    URINE, RANDOM Performed at [facility], [address] [place], [place] [address]    Special Requests   Final    NONE Performed at [facility], [address] [place], [place] [address]    Culture 40,000 COLONIES/mL ENTEROBACTER CLOACAE (A)  Final   Report Status 04/28/2019 FINAL  Final   Organism ID, Bacteria ENTEROBACTER CLOACAE (A)  Final      Susceptibility   Enterobacter cloacae - MIC*    CEFAZOLIN >=64 RESISTANT Resistant     CEFTRIAXONE >=64 RESISTANT Resistant     CIPROFLOXACIN <=0.25 SENSITIVE Sensitive     GENTAMICIN <=1 SENSITIVE Sensitive     IMIPENEM <=0.25 SENSITIVE Sensitive     NITROFURANTOIN 64 INTERMEDIATE Intermediate     TRIMETH/SULFA <=20 SENSITIVE Sensitive     PIP/TAZO >=128 RESISTANT Resistant     * 40,000 COLONIES/mL ENTEROBACTER CLOACAE  Aerobic Culture (superficial specimen)     Status: None   Collection Time: 04/26/19 10:19 AM    Specimen: Wound  Result Value Ref Range Status   Specimen Description   Final    WOUND INCISION ABD Performed at [facility], [address] [place], [place] [address]    Special Requests   Final    NONE Performed at [facility], [address] [place], [place] [address]    Gram Stain   Final    RARE WBC PRESENT, PREDOMINANTLY PMN NO ORGANISMS SEEN Performed at [facility], [address] [place], [place] [address]    Culture RARE ENTEROBACTER CLOACAE  Final   Report Status 04/28/2019 FINAL  Final   Organism ID, Bacteria ENTEROBACTER CLOACAE  Final      Susceptibility   Enterobacter cloacae - MIC*    CEFAZOLIN >=64 RESISTANT Resistant     CEFEPIME 2 SENSITIVE Sensitive     CEFTAZIDIME >=64 RESISTANT Resistant  CEFTRIAXONE >=64 RESISTANT Resistant     CIPROFLOXACIN <=0.25 SENSITIVE Sensitive     GENTAMICIN <=1 SENSITIVE Sensitive     IMIPENEM <=0.25 SENSITIVE Sensitive     TRIMETH/SULFA <=20 SENSITIVE Sensitive     PIP/TAZO >=128 RESISTANT Resistant     * RARE ENTEROBACTER CLOACAE         Radiology Studies: No results found.      Scheduled Meds:  acetaminophen  650 mg Oral Q6H   enoxaparin (LOVENOX) injection  40 mg Subcutaneous Q24H   [START ON 05/01/2019] feeding supplement (ENSURE ENLIVE)  237 mL Oral Q24H   fluticasone  1 spray Each Nare Daily   gabapentin  300 mg Oral TID   lip balm  1 application Topical BID   methocarbamol  1,000 mg Oral TID   metoCLOPramide (REGLAN) injection  5 mg Intravenous Q6H   multivitamin with minerals  1 tablet Oral Daily   pantoprazole  40 mg Oral QHS   sodium chloride flush  10-40 mL Intracatheter Q12H   Continuous Infusions:  ciprofloxacin 400 mg (04/30/19 0840)   TPN ADULT (ION) 75 mL/hr at 04/29/19 1745   TPN ADULT (ION)       LOS: 25 days    Time spent: 35 min    Burke Keels, MD [facility]  If 7PM-7AM, please contact  night-coverage  04/30/2019, 1:23 PM

## 2019-04-30 NOTE — Progress Notes (Signed)
Nutrition Follow-up  RD working remotely.   DOCUMENTATION CODES:   Not applicable  INTERVENTION:  - continue TPN/TPN weaning per Pharmacy and Surgery. - will d/c Boost Breeze. - will order Ensure Enlive once/day, each supplement provides 350 kcal and 20 grams of protein. - will order Magic Cup BID with meals, each supplement provides 290 kcal and 9 grams of protein. - continue to encourage PO intakes and advance diet as medically feasible.    NUTRITION DIAGNOSIS:   Inadequate oral intake related to poor appetite as evidenced by meal completion < 50%, per patient/family report. -revised, ongoing   GOAL:   Patient will meet greater than or equal to 90% of their needs -currently met with TPN at 75 ml/hr.   MONITOR:   PO intake, Supplement acceptance, Diet advancement, Labs, Weight trends, Other (Comment)(TPN regimen)  ASSESSMENT:   53 y.o. female with medical history significant of GERD. She had elective reconstructive surgery of R foot a week ago. She presented to the ED for evaluation of abdominal pain. Patient reported constipation d/t pain medications since surgery. She was experiencing LLQ abdominal pain for several days which became worse in the 24 hours PTA; sharp, excruciating. CT abdomen/pelvis showed acute sigmoid colitis with small foci of adjacent gas that may be extra-luminal and may indicate micro-perforation. No abscess or drainable fluid collection at the time of admission. General surgery was consulted.  Significant Events: 6/4- admission 6/9- double lumen PICC placed in R brachial and TPN started 6/15- NGT placed 6/17- ex lap, Hartman's procedure, small bowel resection 6/28- NGT removed; diet advanced to CLD 6/29- diet advanced to FLD   Weight has been stable since admission on 6/4. NGT was placed on 6/15 and removed on 6/28. Diet advanced from NPO to CLD yesterday at 79 AM with RN flow sheet indicating 0% intake of breakfast and lunch. Diet then advanced from  CLD to [place] today at 8:10 AM; no intakes documented from today.   Patient currently receiving custom TPN at goal rate of 75 ml/hr which is providing 1915 kcal (96% estimated kcal need) and 99 grams protein. Plan, per Pharmacist's note this AM, is to decrease custom TPN rate to 40 ml/hr starting today at 1800.   Boost Breeze was ordered TID yesterday and patient has refused all cartons of this supplement offered. Will adjust ONS orders as outlined above and continue to monitor for need for further adjustment.      Medications reviewed; 5 mg IV reglan QID, daily multivitamin with minerals. Labs reviewed; BUN: 28 mg/dl, Ca: 8.2 mg/dl, Alk Phos elevated.    Diet Order:   Diet Order            Diet full liquid Room service appropriate? Yes; Fluid consistency: Thin  Diet effective now              EDUCATION NEEDS:   No education needs have been identified at this time  Skin:  Skin Assessment: Skin Integrity Issues: Skin Integrity Issues:: Incisions Incisions: abdomen (6/17)  Last BM:  6/29 (150 ml via colostomy)  Height:   Ht Readings from Last 1 Encounters:  04/05/19 '5\' 4"'$  (1.626 m)    Weight:   Wt Readings from Last 1 Encounters:  04/24/19 80.7 kg    Ideal Body Weight:  54.5 kg  BMI:  Body mass index is 30.54 kg/m.  Estimated Nutritional Needs:   Kcal:  2000-2240 kcal  Protein:  95-105 grams  Fluid:  >/= 2 L/day  Jarome Matin, MS, RD, LDN, [facility] Inpatient Clinical Dietitian Pager # [phone] After hours/weekend pager # [phone]

## 2019-05-01 MED ORDER — GUAIFENESIN ER 600 MG PO TB12
600.0000 mg | ORAL_TABLET | Freq: Two times a day (BID) | ORAL | Status: DC
  Administered 2019-05-01 – 2019-05-02 (×3): 600 mg via ORAL
  Filled 2019-05-01 (×3): qty 1

## 2019-05-01 MED ORDER — METOCLOPRAMIDE HCL 5 MG/ML IJ SOLN
5.0000 mg | Freq: Two times a day (BID) | INTRAMUSCULAR | Status: DC
  Administered 2019-05-01: 5 mg via INTRAVENOUS
  Filled 2019-05-01: qty 2

## 2019-05-01 NOTE — Progress Notes (Signed)
PROGRESS NOTE    Molly ReiningCarol T Beck  ZOX:[id number] DOB: 29-Oct-1966 DOA: 04/04/2019 PCP: Clementeen GrahamScifres, [name], [name]   Brief Narrative:  Molly CandleCarol T Liberatoreis a 53 y.o.femalewith medical history significant ofGERD, elective surgery of her right foot a week ago presenting to the hospital for evaluation of abdominal pain.Patient states she had areconstructive right foot surgery done a week ago. States since then she has been constipated due to taking pain medications. Her last bowel movement was prior to the surgery. For the past few days she is having left lower quadrant abdominal pain which became worse for the past 24 hours. The pain is sharp and excruciating. States today she called her surgeon's office and was advised to take magnesium citrate for constipation. She vomited soon after taking magnesium citrate. Denies any fevers. She continues to complain of abdominal pain despite receiving a dose ofmorphine and multiple doses of fentanyl in the ED. CT abdomen pelvis showing acute sigmoid colitis with small foci of adjacent gas that may be extra luminal and may indicate microperforation. No abscess or drainable fluid collection. She was admitted for further treatment and care. General surgery was consulted.  **Interim History Patient with improved abdominal discomfort overnight following insertion of NG tube with significant output. Repeat imaging studies suggest continued small bowel obstruction. Intra-abdominal drains continue to drain well. Patient is continued on TPN because of lack of progression and because of concern for bilious drainage she underwent surgical intervention on 04/18/2019 and urology was consulted for bilateral stent placement and cystoscopy. General surgery did exploratory laparotomy with small bowel resection and Hartman's procedure along with urology doing a cystoscopy with bilateral stent placement. Today patient was in a lot of pain so surgery was admitted  Dilaudid PCA. Patient was complaining of significant pressure in her bladder felt that she had a urinary tract infection however it turned out that her catheter was blocked and she is re-cath and 1 L was removed.   Foley catheter was removed and pain medications are being adjusted and increased. Patient undergoing NG tube clamping and has been ambulating the halls. Better controlled with the PCA Dilaudid. Surgery recommending IV antibiotics for 5 days total Post-op after Surgical Intervention (Today). Will likely need to have her ureteral stenting removed eventually. Continue TNA nutrition for now until bowels are improved continue to mobilize.    Assessment & Plan:   Principal Problem:   Perforated sigmoid colon  Active Problems:   Colitis   Sepsis (HCC)   Hypokalemia   H/O foot surgery   Intraabdominal fluid collection   Encounter for imaging study to confirm nasogastric (NG) tube placement   Small bowel obstruction (HCC)   SBO (small bowel obstruction) (HCC)   WILL SIGN OFF, WITH RECOMMENDATIONS FOR SEVEN DAY COURSE OF ABX FOR UTI BY URINE CULTURE WITH LAST DAY July 4   Sepsis secondary to Acute Sigmoid Colitis with Microperforation complicated by 2 Absecesses status post drain placement and now exploratory laparotomy with Hartman's procedure a small bowel resection along with cystoscopy and bilateral stent placement SBO Sepsis, present on admission and physiology is improving --Because of minimal improvement with green bile color in anterior abdominal drain, General Surgerydidexploratory laparotomy and patient underwent a Hartman's procedure and small bowel resection; she is postoperative day 12today -Urology Consulted by General Surgery for Bilateral Renal Stents and Cystoscopy; Foley catheter was replaced and now has been removed and will need her ureteral stenting removed in the outpatient setting -Last SARS-CoV-2 testing was negative on 04/05/2019 however this  wasrepeated prior to Surgery -KUB on 04/18/2019 showed "Improving small bowel obstruction. Contrast in the colon now reaches the rectum. Stable percutaneous drains in the pelvis."  -Continue supportive care and antiemetics -Pain control per general surgery -continues improving  UTI by UCX; Reviewed cx, given pts stents, and mild tachy, added abx 6/28 NOW RECOMMENDING SEVEN DAY (RATHER THAN 5)  course per sensitivities, last doses July 4,   Sinus Tach: Improving, no need to add additional medication anticipate continued improvement with treatment of UTI  Rash -Maculopapular rash bilateral distribution on her back, improved  -likleydue to heat/sweat rash or contact from bedsheets.  -Continue IV Benadryl and hydrocortisone cream prn.  -Continue hygiene and change of bed sheets daily  Recent right foot surgery and Tendon Repair -Currently in a cast, plans to follow-up with her orthopedic surgeon as an outpatient -PT following -Continue nonweightbearing right lower extremity -No pain,normal white blood cell count, no fevers,given patient's recent surgery would expect inflammatory markers be elevated so  we will not check  Hypokalemia -K+ was4.0 -Continue to Monitor- pt is on TPN -Repeat CMP in AM  Normocytic Anmeia -Patient's Hgb/Hct stable and isSTABLE, WILL CONTINUE TO MONITOR -Anemia panel done and showed an iron level of 16, U IBC 189, TIBC of 205, saturation ratios of 8%, ferritin level 171, folate level 9.5, and vitamin B12 848  Hyperglycemia -improved,Likely reactive and in the setting of TPN; CBG's ranging from 100-118 -HbA1c was 4.9 on 04/14/2019 -Glucose this a.m. on CMP was 128 -CBG's ranging from88-108  GERD -Continue with PPI IV with Pantoprazole 40 mg q24h until able to take p.o.  Anxiety -Continue with Lorazepam 0.5 mg IV every 6 as needed for Anxiety  Obesity -Estimated body mass index is 30.84 kg/m as calculated from the  following: -Weight Loss and Dietary Counseling given   Leukocytosis -In the setting of surgery and reactive -WBCimproved and now7.8 -Continue to monitor for signs and symptoms of infection-none evident currently -Repeat CBC in a.m.  Abnormal ALT -mild bumpstable,, tb nl,reviewed prior history,neghepatitis panel.  Thrombocytosis  -Likely reactive, currently497and trending down -Monitor with CBC  Hypernatremia, improved -Patient sodium level this morning was 141 -In the setting of TPN administration -Continue monitor and trend and repeat sodium level in a.m.  DVT prophylaxis: Lovenox SQ  Code Status: Full    Code Status Orders  (From admission, onward)         Start     Ordered   04/05/19 0100  Full code  Continuous     04/05/19 0101        Code Status History    This patient has a current code status but no historical code status.   Advance Care Planning Activity     Family Communication: per primary  Disposition Plan:   per primary, tentatively wed July 1 Consults called: None Admission status: Inpatient   Consultants:   IR, GEN SURG, ORTHO, UROLOGY  Procedures:  Ct Abdomen Pelvis W Contrast  Result Date: 04/15/2019 CLINICAL DATA:  Severe lower abdominal pain. Unable to eat or drink. History of diverticulitis with recent abscess. Drain in place. EXAM: CT ABDOMEN AND PELVIS WITH CONTRAST TECHNIQUE: Multidetector CT imaging of the abdomen and pelvis was performed using the standard protocol following bolus administration of intravenous contrast. CONTRAST:  OMNIPAQUE IOHEXOL 300 MG/ML SOLN, 30mL OMNIPAQUE IOHEXOL 300 MG/ML SOLN COMPARISON:  Procedures CT 04/10/2019. CT of the abdomen and pelvis 04/09/2019 FINDINGS: Lower chest: The lung bases are clear without focal nodule, mass,  or airspace disease. Small pericardial effusion is similar to prior studies. No pleural effusions are present. Hepatobiliary: No focal liver abnormality is seen. No  gallstones, gallbladder wall thickening, or biliary dilatation. Distension of the gallbladder is likely secondary to fasting. Pancreas: Unremarkable. No pancreatic ductal dilatation or surrounding inflammatory changes. Spleen: Normal in size without focal abnormality. Adrenals/Urinary Tract: Adrenal glands are normal bilaterally. Kidneys and ureters are unremarkable. There is no nodule or mass lesion. No stone is present. There is no obstruction. Stomach/Bowel: The stomach is moderately distended and fluid filled. There is marked distension of proximal small bowel loops measuring up to 3.8 cm in transverse diameter. Transition point is in the anatomic pelvis adjacent to the more anterior and superior abscess. The distal small bowel is collapsed. Contrast is present in the colon. Gas and stool are present throughout the colon to the rectum. There is a small amount of contrast in the rectum. Inflammatory changes about the sigmoid colon are improving. Vascular/Lymphatic: No significant vascular calcifications are present. Subcentimeter lymph nodes are likely reactive. Reproductive: Uterus and bilateral adnexa are unremarkable. IUD is in place. Other: Anterior and posterior drainage catheters are stable in position. The anterior collection is collapsed. There is an adjacent collection which may be separate from the drain measuring 2.0 x 2.4 x 1.9 cm. The posterior drain is in place. Previously noted inferior pelvic collection is markedly reduced in size. This collection now measures 3.2 x 6.0 x 3.4 cm. There is still gas with a fluid collection. No other focal fluid collections are present. Musculoskeletal: Vertebral body heights and alignment are maintained. No focal lytic or blastic lesions are present. Small Schmorl's nodes are present. Bony pelvis is within normal limits. The hips are located. Severe degenerative changes are again noted in the left hip. More mild degenerative changes are present on the right.  IMPRESSION: 1. Dilated small bowel with a transition point in the anatomic pelvis adjacent to the inflammation is consistent with small bowel obstruction. No discrete bowel injury is evident. 2. The anterior abscess cavity has collapsed. 3. Adjacent small fluid collection measures 2.0 x 2.4 x 1.9 cm. 4. Marked reduction in size of the more inferior pelvic collection, now measuring 3.2 x 6.0 x 3.4 cm. Pigtail drainage catheter remains in place. 5. Reduced inflammatory changes surrounding the sigmoid colon. Electronically Signed   By: Marin Roberts M.D.   On: 04/15/2019 15:26   Ct Abdomen Pelvis W Contrast  Result Date: 04/09/2019 CLINICAL DATA:  Elevated white blood cell count and patient with abdominal pain for 1 week. EXAM: CT ABDOMEN AND PELVIS WITH CONTRAST TECHNIQUE: Multidetector CT imaging of the abdomen and pelvis was performed using the standard protocol following bolus administration of intravenous contrast. CONTRAST:  100 mL OMNIPAQUE IOHEXOL 300 MG/ML  SOLN COMPARISON:  CT abdomen pelvis 04/04/2019. FINDINGS: Lower chest: Heart size is normal. No pleural or pericardial effusion. Minimal right basilar atelectasis noted. Hepatobiliary: The gallbladder is distended but otherwise unremarkable. The liver and biliary tree appear normal. Pancreas: Unremarkable. No pancreatic ductal dilatation or surrounding inflammatory changes. Spleen: Normal in size without focal abnormality. Adrenals/Urinary Tract: Small cyst lower pole right kidney incidentally noted. The kidneys otherwise appear normal. Ureters and urinary bladder are normal in appearance. Stomach/Bowel: Extensive inflammatory change about the sigmoid colon has markedly worsened. There is an air and fluid collection posterior and to the right of the uterus which measures approximately 6.5 cm transverse by 9 cm AP by 5 cm craniocaudal consistent with an abscess. A  second air and fluid collection consistent with abscess more superiorly in the  central pelvis measures 4 cm AP by 3.5 cm transverse by 7.5 cm craniocaudal. This collection is contains predominantly gas and it overlies a loop of small bowel which appears inflamed. Presacral edema is identified. The walls of the sigmoid colon are markedly thickened inflamed. The colon otherwise appears normal. The stomach, small bowel and appendix are normal in appearance. Vascular/Lymphatic: No significant vascular findings are present. No enlarged abdominal or pelvic lymph nodes. Reproductive: Uterus and bilateral adnexa are unremarkable. IUD noted. Other: None. Musculoskeletal: No acute or focal bony abnormality. Severe left hip osteoarthritis noted. IMPRESSION: Marked worsening findings in the pelvis most consistent with acute sigmoid diverticulitis with 2 new abscesses identified as described above. These results were called by telephone at the time of interpretation on 04/09/2019 at 2:03 pm to [facility] LLCWILLARD JENNINGS, P.A., Who verbally acknowledged these results. Electronically Signed   By: Drusilla Kannerhomas  Dalessio M.D.   On: 04/09/2019 14:07   Ct Abdomen Pelvis W Contrast  Result Date: 04/04/2019 CLINICAL DATA:  Abdominal pain and decreased bowel movement since surgery EXAM: CT ABDOMEN AND PELVIS WITH CONTRAST TECHNIQUE: Multidetector CT imaging of the abdomen and pelvis was performed using the standard protocol following bolus administration of intravenous contrast. CONTRAST:  100mL OMNIPAQUE IOHEXOL 300 MG/ML  SOLN COMPARISON:  None. FINDINGS: LOWER CHEST: There is no basilar pleural or apical pericardial effusion. HEPATOBILIARY: The hepatic contours and density are normal. There is no intra- or extrahepatic biliary dilatation. The gallbladder is normal. PANCREAS: The pancreatic parenchymal contours are normal and there is no ductal dilatation. There is no peripancreatic fluid collection. SPLEEN: Normal. ADRENALS/URINARY TRACT: --Adrenal glands: Normal. --Right kidney/ureter: No hydronephrosis,  nephroureterolithiasis, perinephric stranding or solid renal mass. --Left kidney/ureter: No hydronephrosis, nephroureterolithiasis, perinephric stranding or solid renal mass. --Urinary bladder: Normal for degree of distention STOMACH/BOWEL: --Stomach/Duodenum: There is no hiatal hernia or other gastric abnormality. The duodenal course and caliber are normal. --Small bowel: No dilatation or inflammation. --Colon: There is mild inflammation of the sigmoid colon. There are small foci of gas along the anterior contour of the distal sigmoid colon that may indicate microperforation (coronal images 43, 46, 47). Large amount of colonic stool. --Appendix: Not clearly visualized. VASCULAR/LYMPHATIC: Normal course and caliber of the major abdominal vessels. No abdominal or pelvic lymphadenopathy. REPRODUCTIVE: There is a T-shaped contraceptive device within the uterus. MUSCULOSKELETAL. No bony spinal canal stenosis or focal osseous abnormality. OTHER: None. IMPRESSION: Acute sigmoid colitis with small foci of adjacent gas that may be extraluminal. This may indicate micro perforation. No abscess or drainable fluid collection. Electronically Signed   By: Deatra RobinsonKevin  Herman M.D.   On: 04/04/2019 23:32   Dg Abd Portable 1v  Result Date: 04/18/2019 CLINICAL DATA:  Small bowel obstruction. EXAM: PORTABLE ABDOMEN - 1 VIEW COMPARISON:  April 17, 2019 FINDINGS: Two drains are seen in the pelvis, in stable position. Contrast is seen in the colon, extending into the rectum. Few mildly prominent loops of small bowel remain in the central abdomen measuring up to 3.3 cm. The more dilated loops superiorly is decreased in caliber. No free air, portal venous gas, or pneumatosis. IMPRESSION: Improving small bowel obstruction. Contrast in the colon now reaches the rectum. Stable percutaneous drains in the pelvis. Electronically Signed   By: Gerome Samavid  Williams III M.D   On: 04/18/2019 08:12   Dg Abd Portable 1v  Result Date: 04/17/2019 CLINICAL  DATA:  Small bowel obstruction. EXAM: PORTABLE ABDOMEN - 1  VIEW COMPARISON:  One-view abdomen 04/16/2019 FINDINGS: Dilated loops of small bowel in the upper abdomen is similar the prior study. More distal loops demonstrate some decompression appear to the most recent x-ray. Surgical drains remain in place. No definite free air is present. Contrast is again noted in the colon. IMPRESSION: 1. Similar dilation of transverse loop of small bowel in the upper abdomen. 2. The remainder of the small bowel has begun to decompressed. 3. Drainage catheters are stable. Electronically Signed   By: San Morelle M.D.   On: 04/17/2019 10:38   Dg Abd Portable 1v-small Bowel Obstruction Protocol-initial, 8 Hr Delay  Result Date: 04/16/2019 CLINICAL DATA:  Small bowel obstruction. 8 hour delayed film. EXAM: PORTABLE ABDOMEN - 1 VIEW COMPARISON:  CT yesterday. FINDINGS: Enteric contrast in the colon, however unclear if this contrast was from prior CT or administered for small bowel protocol. There is residual contrast in the stomach. Persistent dilated small bowel in the central abdomen a 5.1 cm. Two drainage catheters overlie the pelvis. Advanced left hip osteoarthritis. IMPRESSION: Enteric contrast in the colon, however it is unclear if this is residual contrast that was seen on CT yesterday versus that administered for the small-bowel protocol. There is residual contrast in the stomach and persistent dilated small bowel in the central abdomen. Recommend 24 hour delayed film. Electronically Signed   By: Keith Rake M.D.   On: 04/16/2019 23:43   Dg Abd Portable 1v-small Bowel Protocol-position Verification  Result Date: 04/16/2019 CLINICAL DATA:  NG tube placement. EXAM: PORTABLE ABDOMEN - 1 VIEW COMPARISON:  Abdominal CT 04/15/2019 FINDINGS: Enteric catheter projects over the expected location of gastric cardia. Persistent small bowel loop dilation with maximum diameter of 5.4 cm. No free intra-abdominal gas is  seen. IMPRESSION: 1. Enteric catheter projects over the expected location of gastric cardia. 2. Persistent small bowel obstruction. Electronically Signed   By: Fidela Salisbury M.D.   On: 04/16/2019 13:20   Dg C-arm 1-60 Min-no Report  Result Date: 04/18/2019 Fluoroscopy was utilized by the requesting physician.  No radiographic interpretation.   Ct Image Guided Drainage By Percutaneous Catheter  Result Date: 04/10/2019 CLINICAL DATA:  Sigmoid diverticulitis with rupture and abscesses in the central upper pelvis and deep lower posterior pelvis. EXAM: 1. CT GUIDED CATHETER DRAINAGE OF PERITONEAL DIVERTICULAR ABSCESS 2. CT-GUIDED CATHETER DRAINAGE OF PELVIC PERITONEAL ABSCESS ANESTHESIA/SEDATION: 4.0 mg IV Versed 100 mcg IV Fentanyl Total Moderate Sedation Time:  52 minutes The patient's level of consciousness and physiologic status were continuously monitored during the procedure by Radiology nursing. PROCEDURE: The procedure, risks, benefits, and alternatives were explained to the patient. Questions regarding the procedure were encouraged and answered. The patient understands and consents to the procedure. A time out was performed prior to initiating the procedure. The anterior abdominal wall followed by the right transgluteal region were prepped with chlorhexidine in a sterile fashion, and a sterile drape was applied covering the operative field. A sterile gown and sterile gloves were used for the procedure. Local anesthesia was provided with 1% Lidocaine. An 18 gauge trocar needle was advanced from a right anterior approach to the level of a sigmoid diverticular abscess. After confirming needle tip position, aspiration was performed. A guidewire was advanced and the needle removed. The tract was dilated over the wire. A 10 French percutaneous drainage catheter was then advanced over the wire and formed. Catheter position was confirmed by CT. Additional fluid sample was aspirated from the drain. The  drainage catheter was then flushed  with saline and attached to a suction bulb. A Prolene retention suture was applied at the skin exit site. The patient was then placed in a prone position and from a right transgluteal approach, an 18 gauge trocar needle was advanced to the level a second posterior lower pelvic abscess abscess. After confirming needle tip position, aspiration was performed. A guidewire was advanced and the needle removed. The tract was dilated over the wire. A 10 French percutaneous drainage catheter was then advanced over the wire and formed. Catheter position was confirmed by CT. Additional fluid sample was aspirated from the drain. The drainage catheter was then flushed with saline and attached to a suction bulb. A Prolene retention suture was applied at the skin exit site. COMPLICATIONS: None FINDINGS: There was an anterior approach available to the level of the central lower abdominal sigmoid diverticular abscess. Aspiration yielded feculent material. Additional aspiration via a drainage catheter yielded purulent and feculent fluid. A 10 French drain was formed in the abscess cavity. Persistent posterior pelvic abscess was able to be accessed from a right transgluteal approach. Aspiration yielded feculent and purulent fluid. A 10 French drain was formed in the abscess. IMPRESSION: CT-guided percutaneous drainage of 2 separate peritoneal abscess collections related to ruptured sigmoid diverticulitis. A 10 French drain was placed from an anterior approach into the sigmoid diverticular abscess. A second 10 French drain was placed from a posterior approach into the posterior deep pelvic abscess. Both drains were attached to suction bulbs. Electronically Signed   By: Irish LackGlenn  Yamagata M.D.   On: 04/10/2019 16:41   Ct Image Guided Drainage By Percutaneous Catheter  Result Date: 04/10/2019 CLINICAL DATA:  Sigmoid diverticulitis with rupture and abscesses in the central upper pelvis and deep lower  posterior pelvis. EXAM: 1. CT GUIDED CATHETER DRAINAGE OF PERITONEAL DIVERTICULAR ABSCESS 2. CT-GUIDED CATHETER DRAINAGE OF PELVIC PERITONEAL ABSCESS ANESTHESIA/SEDATION: 4.0 mg IV Versed 100 mcg IV Fentanyl Total Moderate Sedation Time:  52 minutes The patient's level of consciousness and physiologic status were continuously monitored during the procedure by Radiology nursing. PROCEDURE: The procedure, risks, benefits, and alternatives were explained to the patient. Questions regarding the procedure were encouraged and answered. The patient understands and consents to the procedure. A time out was performed prior to initiating the procedure. The anterior abdominal wall followed by the right transgluteal region were prepped with chlorhexidine in a sterile fashion, and a sterile drape was applied covering the operative field. A sterile gown and sterile gloves were used for the procedure. Local anesthesia was provided with 1% Lidocaine. An 18 gauge trocar needle was advanced from a right anterior approach to the level of a sigmoid diverticular abscess. After confirming needle tip position, aspiration was performed. A guidewire was advanced and the needle removed. The tract was dilated over the wire. A 10 French percutaneous drainage catheter was then advanced over the wire and formed. Catheter position was confirmed by CT. Additional fluid sample was aspirated from the drain. The drainage catheter was then flushed with saline and attached to a suction bulb. A Prolene retention suture was applied at the skin exit site. The patient was then placed in a prone position and from a right transgluteal approach, an 18 gauge trocar needle was advanced to the level a second posterior lower pelvic abscess abscess. After confirming needle tip position, aspiration was performed. A guidewire was advanced and the needle removed. The tract was dilated over the wire. A 10 French percutaneous drainage catheter was then advanced over  the  wire and formed. Catheter position was confirmed by CT. Additional fluid sample was aspirated from the drain. The drainage catheter was then flushed with saline and attached to a suction bulb. A Prolene retention suture was applied at the skin exit site. COMPLICATIONS: None FINDINGS: There was an anterior approach available to the level of the central lower abdominal sigmoid diverticular abscess. Aspiration yielded feculent material. Additional aspiration via a drainage catheter yielded purulent and feculent fluid. A 10 French drain was formed in the abscess cavity. Persistent posterior pelvic abscess was able to be accessed from a right transgluteal approach. Aspiration yielded feculent and purulent fluid. A 10 French drain was formed in the abscess. IMPRESSION: CT-guided percutaneous drainage of 2 separate peritoneal abscess collections related to ruptured sigmoid diverticulitis. A 10 French drain was placed from an anterior approach into the sigmoid diverticular abscess. A second 10 French drain was placed from a posterior approach into the posterior deep pelvic abscess. Both drains were attached to suction bulbs. Electronically Signed   By: Irish Lack M.D.   On: 04/10/2019 16:41   [place] Ekg Site Rite  Result Date: 04/10/2019 If Site Rite image not attached, placement could not be confirmed due to current cardiac rhythm.    Antimicrobials:   CIPRO 3/7   Subjective: Reports continues to feel better each day,    Objective: Vitals:   04/30/19 2031 05/01/19 0512 05/01/19 1059 05/01/19 1312  BP: 103/67 104/64  104/71  Pulse: 99 89  (!) 101  Resp: 16 16  18   Temp: 99.1 F (37.3 C) 98.7 F (37.1 C)  99.5 F (37.5 C)  TempSrc: Oral Oral  Oral  SpO2: 99% 96%  98%  Weight:   76.9 kg   Height:        Intake/Output Summary (Last 24 hours) at 05/01/2019 1343 Last data filed at 05/01/2019 1339 Gross per 24 hour  Intake 2800.16 ml  Output 1950 ml  Net 850.16 ml   Filed Weights    04/17/19 1050 04/24/19 1233 05/01/19 1059  Weight: 81.5 kg 80.7 kg 76.9 kg    Examination:  General exam:Appears calm and comfortable  Respiratory system: Clear to auscultation. Respiratory effort normal. Cardiovascular system:S1 &S2 heard, RRR. No JVD, murmurs, rubs, gallops or clicks. No pedal edema. Gastrointestinal system:Soft, ND.Midline wound open with healthy granulation tissue.There is no cellulitis or active infection on exam.Colostomy bag in place,with large amount of brown soft stool in bag,.drain in RLQ no signs of infection Central nervous system:Alert and oriented. No focal neurological deficits. Extremities:Warm well perfused cast right lower extremity, no pain Skin: No rashes, lesions or ulcers Psychiatry:Judgement and insight appear normal. Mood &affect appropriate..     Data Reviewed: I have personally reviewed following labs and imaging studies  CBC: Recent Labs  Lab 04/26/19 1135 04/27/19 0403 04/30/19 0510  WBC 6.9 7.8 4.2  NEUTROABS 5.4  --  2.6  HGB 8.5* 8.6* 8.3*  HCT 26.1* 28.0* 26.7*  MCV 102.8* 97.2 96.7  PLT 490* 497* 330   Basic Metabolic Panel: Recent Labs  Lab 04/25/19 0420 04/26/19 0336 04/27/19 0403 04/28/19 0415 04/30/19 0510  NA 145 146* 143 141 136  K 3.7 3.8 3.6 4.2 4.0  CL 114* 113* 110 109 103  CO2 23 24 25 26 25   GLUCOSE 122* 117* 109* 133* 118*  BUN 32* 30* 29* 31* 28*  CREATININE 0.47 0.54 0.49 0.54 0.54  CALCIUM 8.6* 8.6* 8.6* 8.5* 8.2*  MG  --  2.0  --  2.0  1.9  PHOS 3.4 3.9  --   --  3.8   GFR: Estimated Creatinine Clearance: 82.6 mL/min (by C-G formula based on SCr of 0.54 mg/dL). Liver Function Tests: Recent Labs  Lab 04/26/19 0336 04/27/19 0403 04/28/19 0415 04/30/19 0510  AST 61* 47* 37 35  ALT 90* 91* 83* 75*  ALKPHOS 145* 161* 149* 165*  BILITOT 0.4 0.7 0.4 0.3  PROT 6.3* 6.8 6.4* 6.0*  ALBUMIN 2.6* 2.8* 2.6* 2.5*   No results for input(s): LIPASE, AMYLASE in the last 168 hours. No  results for input(s): AMMONIA in the last 168 hours. Coagulation Profile: No results for input(s): INR, PROTIME in the last 168 hours. Cardiac Enzymes: No results for input(s): CKTOTAL, CKMB, CKMBINDEX, TROPONINI in the last 168 hours. BNP (last 3 results) No results for input(s): PROBNP in the last 8760 hours. HbA1C: No results for input(s): HGBA1C in the last 72 hours. CBG: No results for input(s): GLUCAP in the last 168 hours. Lipid Profile: Recent Labs    04/30/19 0510  TRIG 96   Thyroid Function Tests: No results for input(s): TSH, T4TOTAL, FREET4, T3FREE, THYROIDAB in the last 72 hours. Anemia Panel: No results for input(s): VITAMINB12, FOLATE, FERRITIN, TIBC, IRON, RETICCTPCT in the last 72 hours. Sepsis Labs: No results for input(s): PROCALCITON, LATICACIDVEN in the last 168 hours.  Recent Results (from the past 240 hour(s))  Culture, Urine     Status: Abnormal   Collection Time: 04/26/19  9:45 AM   Specimen: Urine, Random  Result Value Ref Range Status   Specimen Description   Final    URINE, RANDOM Performed at [facility], [address], [place], [place] [address]    Special Requests   Final    NONE Performed at [facility], [address], [place], [place] [address]    Culture 40,000 COLONIES/mL ENTEROBACTER CLOACAE (A)  Final   Report Status 04/28/2019 FINAL  Final   Organism ID, Bacteria ENTEROBACTER CLOACAE (A)  Final      Susceptibility   Enterobacter cloacae - MIC*    CEFAZOLIN >=64 RESISTANT Resistant     CEFTRIAXONE >=64 RESISTANT Resistant     CIPROFLOXACIN <=0.25 SENSITIVE Sensitive     GENTAMICIN <=1 SENSITIVE Sensitive     IMIPENEM <=0.25 SENSITIVE Sensitive     NITROFURANTOIN 64 INTERMEDIATE Intermediate     TRIMETH/SULFA <=20 SENSITIVE Sensitive     PIP/TAZO >=128 RESISTANT Resistant     * 40,000 COLONIES/mL ENTEROBACTER CLOACAE  Aerobic Culture (superficial specimen)     Status: None   Collection  Time: 04/26/19 10:19 AM   Specimen: Wound  Result Value Ref Range Status   Specimen Description   Final    WOUND INCISION ABD Performed at [facility], [address] [place], [place] [address]    Special Requests   Final    NONE Performed at [facility], [address] [place], [place] [address]    Gram Stain   Final    RARE WBC PRESENT, PREDOMINANTLY PMN NO ORGANISMS SEEN Performed at [facility], [address], [place], [place] [address]    Culture RARE ENTEROBACTER CLOACAE  Final   Report Status 04/28/2019 FINAL  Final   Organism ID, Bacteria ENTEROBACTER CLOACAE  Final      Susceptibility   Enterobacter cloacae - MIC*    CEFAZOLIN >=64 RESISTANT Resistant     CEFEPIME 2 SENSITIVE Sensitive     CEFTAZIDIME >=64 RESISTANT  Resistant     CEFTRIAXONE >=64 RESISTANT Resistant     CIPROFLOXACIN <=0.25 SENSITIVE Sensitive     GENTAMICIN <=1 SENSITIVE Sensitive     IMIPENEM <=0.25 SENSITIVE Sensitive     TRIMETH/SULFA <=20 SENSITIVE Sensitive     PIP/TAZO >=128 RESISTANT Resistant     * RARE ENTEROBACTER CLOACAE         Radiology Studies: No results found.      Scheduled Meds:  acetaminophen  650 mg Oral Q6H   enoxaparin (LOVENOX) injection  40 mg Subcutaneous Q24H   feeding supplement (ENSURE ENLIVE)  237 mL Oral Q24H   fluticasone  1 spray Each Nare Daily   gabapentin  300 mg Oral TID   guaiFENesin  600 mg Oral BID   lip balm  1 application Topical BID   methocarbamol  1,000 mg Oral TID   metoCLOPramide (REGLAN) injection  5 mg Intravenous Q12H   multivitamin with minerals  1 tablet Oral Daily   pantoprazole  40 mg Oral QHS   sodium chloride flush  10-40 mL Intracatheter Q12H   Continuous Infusions:  ciprofloxacin 400 mg (05/01/19 0809)   TPN ADULT (ION) 40 mL/hr at 04/30/19 1812     LOS: 26 days    Time spent: 35 min    Burke Keels, MD [facility]  If 7PM-7AM,  please contact night-coverage  05/01/2019, 1:43 PM

## 2019-05-01 NOTE — TOC Initial Note (Signed)
Transition of Care [facility](TOC) - Initial/Assessment Note    Patient Details  Name: Molly Beck MRN: [id number] Date of Birth: [birth year]  Transition of Care (TOC) CM/SW Contact:    Armanda Heritageorres, Colston Pyle Malkina, RN Phone Number: 05/01/2019, 2:35 PM  Clinical Narrative:   CM spoke with patient at bedside. HHRN ordered by MD, however no HH agency available either due to staffing challenges or out of network with patients insurance.  Patient informs CM that both of her daughters are CNA and can assist at home if they can be taught.  [facility] and confirmed that the dughters can come to the hospital. [facility] and confirmed time frame when patient will be seen tomorrow.  CM spoke with patient and informed her of this.  Patient's daughters are going to come in tomorrow 05/02/19 from 9-12 to be educated on ostomy care and management of midline incision including dressing changes.  Bedside RN will educate about incision dressings.   Patient feels safe to return home once her daughters have been educated on her care needs.              Expected Discharge Plan: Home/Self Care Barriers to Discharge: Continued Medical Work up   Patient Goals and CMS Choice        Expected Discharge Plan and Services Expected Discharge Plan: Home/Self Care   Discharge Planning Services: CM Consult   Living arrangements for the past 2 months: Apartment                 DME Arranged: N/A DME Agency: NA       HH Arranged: NA HH Agency: NA        Prior Living Arrangements/Services Living arrangements for the past 2 months: Apartment Lives with:: Spouse Patient language and need for interpreter reviewed:: Yes Do you feel safe going back to the place where you live?: Yes      Need for Family Participation in Patient Care: Yes (Comment) Care giver support system in place?: Yes (comment)   Criminal Activity/Legal Involvement Pertinent to Current Situation/Hospitalization: No - Comment as  needed  Activities of Daily Living Home Assistive Devices/Equipment: Crutches ADL Screening (condition at time of admission) Patient's cognitive ability adequate to safely complete daily activities?: Yes Is the patient deaf or have difficulty hearing?: No Does the patient have difficulty seeing, even when wearing glasses/contacts?: No Does the patient have difficulty concentrating, remembering, or making decisions?: No Patient able to express need for assistance with ADLs?: Yes Does the patient have difficulty dressing or bathing?: No Independently performs ADLs?: Yes (appropriate for developmental age) Does the patient have difficulty walking or climbing stairs?: Yes Weakness of Legs: None Weakness of Arms/Hands: None  Permission Sought/Granted                  Emotional Assessment Appearance:: Appears stated age Attitude/Demeanor/Rapport: Engaged Affect (typically observed): Accepting Orientation: : Oriented to Self, Oriented to Place, Oriented to  Time, Oriented to Situation   Psych Involvement: No (comment)  Admission diagnosis:  Colitis [K52.9] Patient Active Problem List   Diagnosis Date Noted  . Intraabdominal fluid collection   . Encounter for imaging study to confirm nasogastric (NG) tube placement   . Small bowel obstruction (HCC)   . SBO (small bowel obstruction) (HCC)   . Perforated sigmoid colon  04/09/2019  . Colitis 04/05/2019  . Sepsis (HCC) 04/05/2019  . Hypokalemia 04/05/2019  . H/O foot surgery 04/05/2019   PCP:  Scifres, Earlie Server, [place] Pharmacy:   [facility] [address], [place] [address] Phone: [phone] Fax: [phone]     Social Determinants of Health (SDOH) Interventions    Readmission Risk Interventions Readmission Risk Prevention Plan 05/01/2019  Transportation Screening Complete  PCP or Specialist Appt within 3-5 Days Not Complete  Not Complete  comments not ready for d/c  HRI or [place] Complete  Social Work Consult for [place] Planning/Counseling Complete  Palliative Care Screening Not Applicable  Medication Review Press photographer) Complete  Some recent data might be hidden

## 2019-05-01 NOTE — Progress Notes (Signed)
[facility] Progress Note  13 Days Post-Op  Subjective: CC: no complaints Patient states she is feeling much better this AM. Denies much pain. Tolerated FLD and nausea is improved. Still having some phlegm and would prefer mucinex be scheduled rather than prn. Discussed advancing diet and potential for discharge tomorrow.   Objective: Vital signs in last 24 hours: Temp:  [98.4 F (36.9 C)-99.1 F (37.3 C)] 98.7 F (37.1 C) (06/30 0512) Pulse Rate:  [89-102] 89 (06/30 0512) Resp:  [16-17] 16 (06/30 0512) BP: (97-104)/(64-70) 104/64 (06/30 0512) SpO2:  [96 %-99 %] 96 % (06/30 0512) Last BM Date: 04/29/19  Intake/Output from previous day: 06/29 0701 - 06/30 0700 In: 2468.4 [P.O.:920; I.V.:948.4; IV Piggyback:600] Out: 1860 [Urine:1625; Drains:35; Stool:200] Intake/Output this shift: No intake/output data recorded.  PE: Gen:  Alert, NAD, pleasant Card:  Regular rate and rhythm Pulm:  Normal effort, clear to auscultation bilaterally Abd: Soft, appropriately tender, non-distended, +BS, midline wound with beefy red granulation and scant drainage, stoma pink with soft brown stool output, drain in RLQ with minimal SS drainage Skin: warm and dry, no rashes  Psych: A&Ox3   Lab Results:  Recent Labs    04/30/19 0510  WBC 4.2  HGB 8.3*  HCT 26.7*  PLT 330   BMET Recent Labs    04/30/19 0510  NA 136  K 4.0  CL 103  CO2 25  GLUCOSE 118*  BUN 28*  CREATININE 0.54  CALCIUM 8.2*   PT/INR No results for input(s): LABPROT, INR in the last 72 hours. CMP     Component Value Date/Time   NA 136 04/30/2019 0510   K 4.0 04/30/2019 0510   CL 103 04/30/2019 0510   CO2 25 04/30/2019 0510   GLUCOSE 118 (H) 04/30/2019 0510   BUN 28 (H) 04/30/2019 0510   CREATININE 0.54 04/30/2019 0510   CALCIUM 8.2 (L) 04/30/2019 0510   PROT 6.0 (L) 04/30/2019 0510   ALBUMIN 2.5 (L) 04/30/2019 0510   AST 35 04/30/2019 0510   ALT 75 (H) 04/30/2019 0510   ALKPHOS 165 (H)  04/30/2019 0510   BILITOT 0.3 04/30/2019 0510   GFRNONAA >60 04/30/2019 0510   GFRAA >60 04/30/2019 0510   Lipase     Component Value Date/Time   LIPASE 25 09/12/2015 1615       Studies/Results: No results found.  Anti-infectives: Anti-infectives (From admission, onward)   Start     Dose/Rate Route Frequency Ordered Stop   04/29/19 0900  ciprofloxacin (CIPRO) IVPB 400 mg     400 mg 200 mL/hr over 60 Minutes Intravenous Every 12 hours 04/29/19 0827     04/28/19 1800  ceFEPIme (MAXIPIME) 2 g in sodium chloride 0.9 % 100 mL IVPB  Status:  Discontinued     2 g 200 mL/hr over 30 Minutes Intravenous Every 8 hours 04/28/19 1720 04/29/19 0804   04/23/19 0200  piperacillin-tazobactam (ZOSYN) IVPB 3.375 g     3.375 g 12.5 mL/hr over 240 Minutes Intravenous Every 8 hours 04/23/19 0144 04/23/19 0832   04/16/19 0600  cefoTEtan (CEFOTAN) 2 g in sodium chloride 0.9 % 100 mL IVPB     2 g 200 mL/hr over 30 Minutes Intravenous On call to O.R. 04/15/19 1505 04/17/19 0559   04/05/19 0600  piperacillin-tazobactam (ZOSYN) IVPB 3.375 g  Status:  Discontinued     3.375 g 12.5 mL/hr over 240 Minutes Intravenous Every 8 hours 04/05/19 0121 04/23/19 0144   04/05/19 0000  piperacillin-tazobactam (ZOSYN) IVPB 3.375  g     3.375 g 100 mL/hr over 30 Minutes Intravenous  Once 04/04/19 2355 04/05/19 0119       Assessment/Plan GERD Osteoarthritis - takes meloxicam and tramadol at baseline RecentR flatfoot reconstruction and lapidus bunion correctionsurgery by Dr. Hewitt5/26/2020- per note on 6/15 "patient is fine to stay in her post-op splint until she is D/C'd from the hospital" & "stitches can stay in without causing any type of difficulty in regards to her wound healing"  Protein Calorie Malnutrition - prealbumin 30.2 yesterday, improving Anemia -Hgb 8.3, stable Tachycardia -mild, improving   PERFORATION OF SMALL BOWEL BY CT GUIDED DRAIN PERFORATED DIVERTICULITIS -s/p Exploratory  laparotomy, Hartman's procedure, small bowel resection - Dr. Marcello Moores - 04/18/2019 - POD #13 - Cystoscopy with Stent placement - Dr. Matilde Sprang - 6/17 - Foley out, voiding on own. Ureteral stents per Urology.UA without definitive UTI. Urine cx w/ <100,000 colonies/ml. Started on cipro for UTI, defer to urology -Continues to have ostomy output,nausea improved.Likely gastric ileus, weaning reglan - advance to soft diet - drain with minimal SS output, may be able to remove prior to d/c - Surgical pathology benign. Diverticular disease with transmural defect. One benign lymph node, serosal adhesions, margins histologically viable, no evidence of malignancy - Wound infection improving, cefepime discontinued   FEN - soft diet, d/c TPN VTE -SCDs, Lovenox ID -[id number] - 6/22; cefepime 6/27-6/28; IV cipro 6/28>> Foley - Removed Follow-Up: Dr. Marcello Moores, Dr. Matilde Sprang   LOS: 26 days    Brigid Re , [facility] 05/01/2019, 7:19 AM Pager: [phone] Consults: [phone]

## 2019-05-02 MED ORDER — ONDANSETRON HCL 4 MG PO TABS: 4.0000 mg | ORAL_TABLET | Freq: Every day | ORAL | 1 refills | Status: DC | PRN

## 2019-05-02 MED ORDER — CIPROFLOXACIN HCL 500 MG PO TABS: 500.0000 mg | ORAL_TABLET | Freq: Two times a day (BID) | ORAL | 0 refills | Status: DC

## 2019-05-02 MED ORDER — GABAPENTIN 300 MG PO CAPS: 300.0000 mg | ORAL_CAPSULE | Freq: Three times a day (TID) | ORAL | 0 refills | Status: DC

## 2019-05-02 MED ORDER — ACETAMINOPHEN 325 MG PO TABS: 650.0000 mg | ORAL_TABLET | Freq: Four times a day (QID) | ORAL | Status: DC | PRN

## 2019-05-02 MED ORDER — METHOCARBAMOL 500 MG PO TABS: 1000.0000 mg | ORAL_TABLET | Freq: Three times a day (TID) | ORAL | 0 refills | Status: DC

## 2019-05-02 MED ORDER — GUAIFENESIN ER 600 MG PO TB12: 600.0000 mg | ORAL_TABLET | Freq: Two times a day (BID) | ORAL | Status: DC | PRN

## 2019-05-02 MED ORDER — ONDANSETRON HCL 4 MG PO TABS: 4.0000 mg | ORAL_TABLET | Freq: Three times a day (TID) | ORAL | 0 refills | Status: DC | PRN

## 2019-05-02 MED ORDER — TRAMADOL HCL 50 MG PO TABS: 50.0000 mg | ORAL_TABLET | Freq: Three times a day (TID) | ORAL | 0 refills | Status: DC | PRN

## 2019-05-02 NOTE — Discharge Instructions (Signed)
[facility], [place] [phone]  OPEN ABDOMINAL SURGERY: POST OP INSTRUCTIONS  Always review your discharge instruction sheet given to you by the facility where your surgery was performed.  IF YOU HAVE DISABILITY OR FAMILY LEAVE FORMS, YOU MUST BRING THEM TO THE OFFICE FOR PROCESSING.  PLEASE DO NOT GIVE THEM TO YOUR DOCTOR.  1. A prescription for pain medication may be given to you upon discharge.  Take your pain medication as prescribed, if needed.  If narcotic pain medicine is not needed, then you may take acetaminophen (Tylenol) or ibuprofen (Advil) as needed. 2. Take your usually prescribed medications unless otherwise directed. 3. If you need a refill on your pain medication, please contact your pharmacy. They will contact our office to request authorization.  Prescriptions will not be filled after 5pm or on week-ends. 4. You should follow a light diet the first few days after arrival home, such as soup and crackers, pudding, etc.unless your doctor has advised otherwise. A high-fiber, low fat diet can be resumed as tolerated.   Be sure to include lots of fluids daily. Most patients will experience some swelling and bruising on the chest and neck area.  Ice packs will help.  Swelling and bruising can take several days to resolve 5. Most patients will experience some swelling and bruising in the area of the incision. Ice pack will help. Swelling and bruising can take several days to resolve..  6. It is common to experience some constipation if taking pain medication after surgery.  Increasing fluid intake and taking a stool softener will usually help or prevent this problem from occurring.  A mild laxative (Milk of Magnesia or Miralax) should be taken according to package directions if there are no bowel movements after 48 hours. 7.  You may have steri-strips (small skin tapes) in place directly over the incision.  These strips should be left on the skin for 7-10 days.  If your  surgeon used skin glue on the incision, you may shower in 24 hours.  The glue will flake off over the next 2-3 weeks.  Any sutures or staples will be removed at the office during your follow-up visit. You may find that a light gauze bandage over your incision may keep your staples from being rubbed or pulled. You may shower and replace the bandage daily. 8. ACTIVITIES:  You may resume regular (light) daily activities beginning the next day--such as daily self-care, walking, climbing stairs--gradually increasing activities as tolerated.  You may have sexual intercourse when it is comfortable.  Refrain from any heavy lifting or straining until approved by your doctor. a. You may drive when you no longer are taking prescription pain medication, you can comfortably wear a seatbelt, and you can safely maneuver your car and apply brakes  9. You should see your doctor in the office for a follow-up appointment approximately two weeks after your surgery.  Make sure that you call for this appointment within a day or two after you arrive home to insure a convenient appointment time.   WHEN TO CALL YOUR DOCTOR: 1. Fever over 101.0 2. Inability to urinate 3. Nausea and/or vomiting 4. Extreme swelling or bruising 5. Continued bleeding from incision. 6. Increased pain, redness, or drainage from the incision. 7. Difficulty swallowing or breathing 8. Muscle cramping or spasms. 9. Numbness or tingling in hands or feet or around lips.  The clinic staff is available to answer your questions during regular business hours.  Please dont hesitate to call and ask to speak to one of the nurses if you have concerns.  For further questions, please visit [email or web address]   Colostomy Home Guide, Adult  Colostomy surgery is done to create an opening in the front of the abdomen for stool (feces) to leave the body through an ostomy (stoma). Part of the large intestine is attached to the stoma. A bag, also  called a pouch, is fitted over the stoma. Stool and gas will collect in the bag. After surgery, you will need to empty and change your colostomy bag as needed. You will also need to care for your stoma. How to care for the stoma Your stoma should look pink, red, and moist, like the inside of your cheek. Soon after surgery, the stoma may be swollen, but this swelling will go away within 6 weeks. To care for the stoma:  Keep the skin around the stoma clean and dry.  Use a clean, soft washcloth to gently wash the stoma and the skin around it. Clean using a circular motion, and wipe away from the stoma opening, not toward it. ? Use warm water and only use cleansers recommended by your health care provider. ? Rinse the stoma area with plain water. ? Dry the area around the stoma well.  Use stoma powder or ointment on your skin only as told by your health care provider. Do not use any other powders, gels, wipes, or creams on the skin around the stoma.  Check the stoma area every day for signs of infection. Check for: ? New or worsening redness, swelling, or pain. ? New or increased fluid or blood. ? Pus or warmth.  Measure the stoma opening regularly and record the size. Watch for changes. (It is normal for the stoma to get smaller as swelling goes away.) Share this information with your health care provider. How to empty the colostomy bag  Empty your bag at bedtime and whenever it is one-third to one-half full. Do not let the bag get more than half-full with stool or gas. The bag could leak if it gets too full. Some colostomy bags have a built-in gas release valve that releases gas often throughout the day. Follow these basic steps: 1. Wash your hands with soap and water. 2. Sit far back on the toilet seat. 3. Put several pieces of toilet paper into the toilet water. This will prevent splashing as you empty stool into the toilet. 4. Remove the clip or the hook-and-loop fastener from the tail  end of the bag. 5. Unroll the tail, then empty the stool into the toilet. 6. Clean the tail with toilet paper or a moist towelette. 7. Reroll the tail, and close it with the clip or the hook-and-loop fastener. 8. Wash your hands again. How to change the colostomy bag Change your bag every 3-4 days or as often as told by your health care provider. Also change the bag if it is leaking or separating from the skin, or if your skin around the stoma looks or feels irritated. Irritated skin may be a sign that the bag is leaking. Always have colostomy supplies with you, and follow these basic steps: 1. Wash your hands with soap and water. Have paper towels or tissues nearby to clean any discharge. 2. Remove the old bag and skin barrier. Use your fingers or a warm cloth to gently push the skin away from the barrier. 3. Clean the stoma area with water or with mild soap  and water, as directed. Use water to rinse away any soap. 4. Dry the skin. You may use the cool setting on a hair dryer to do this. 5. Use a tracing pattern (template) to cut the skin barrier to the size needed. 6. If you are using a two-piece bag, attach the bag and the skin barrier to each other. Add the barrier ring, if you use one. 7. If directed, apply stoma powder or skin barrier gel to the skin. 8. Warm the skin barrier with your hands, or blow with a hair dryer for 5-10 seconds. 9. Remove the paper from the adhesive strip of the skin barrier. 10. Press the adhesive strip onto the skin around the stoma. 11. Gently rub the skin barrier onto the skin. This creates heat that helps the barrier to stick. 12. Apply stoma tape to the edges of the skin barrier, if desired. 26. Wash your hands again. General recommendations  Avoid wearing tight clothes or having anything press directly on your stoma or bag. Change your clothing whenever it is soiled or damp.  You may shower or bathe with the bag on or off. Do not use harsh or oily soaps  or lotions. Dry the skin and bag after bathing.  Store all supplies in a cool, dry place. Do not leave supplies in extreme heat because some parts can melt or not stick as well.  Whenever you leave home, take extra clothing and an extra skin barrier and bag with you.  If your bag gets wet, you can dry it with a hair dryer on the cool setting.  To prevent odor, you may put drops of ostomy deodorizer in the bag.  If recommended by your health care provider, put ostomy lubricant inside the bag. This helps stool to slide out of the bag more easily and completely. Contact a health care provider if:  You have new or worsening redness, swelling, or pain around your stoma.  You have new or increased fluid or blood coming from your stoma.  Your stoma feels warm to the touch.  You have pus coming from your stoma.  Your stoma extends in or out farther than normal.  You need to change your bag every day.  You have a fever. Get help right away if:  Your stool is bloody.  You have nausea or you vomit.  You have trouble breathing. Summary  Measure your stoma opening regularly and record the size. Watch for changes.  Empty your bag at bedtime and whenever it is one-third to one-half full. Do not let the bag get more than half-full with stool or gas.  Change your bag every 3-4 days or as often as told by your health care provider.  Whenever you leave home, take extra clothing and an extra skin barrier and bag with you. This information is not intended to replace advice given to you by your health care provider. Make sure you discuss any questions you have with your health care provider. Document Released: 10/21/2003 Document Revised: 02/07/2019 Document Reviewed: 04/13/2017 Elsevier Patient Education  2020 [place]: - midline dressing to be changed twice daily - supplies: sterile saline, gauze, scissors, ABD pads, tape  - remove dressing and all packing  carefully, moistening with sterile saline as needed to avoid packing/internal dressing sticking to the wound. - clean edges of skin around the wound with water/gauze, making sure there is no tape debris or leakage left on skin that could cause skin irritation  or breakdown. - dampen clean gauze with sterile saline and pack wound from wound base to skin level, making sure to take note of any possible areas of wound tracking, tunneling and packing appropriately. Wound can be packed loosely. Trim gauze to size if a whole sheet is not required. - cover wound with a dry ABD pad or gauze and secure with tape.  - write the date/time on the dry dressing/tape to better track when the last dressing change occurred. - apply any skin protectant/powder recommended by clinician to protect skin/skin folds. - change dressing as needed if leakage occurs, wound gets contaminated, or patient requests to shower. - patient may shower daily with wound open and following the shower the wound should be dried and a clean dressing placed.

## 2019-05-02 NOTE — Discharge Summary (Signed)
[facility] Discharge Summary   Patient ID: Molly Beck MRN: [id number] DOB/AGE: Dec 24, 1965 53 y.o.  Admit date: 04/04/2019 Discharge date: 05/02/2019  Admitting Diagnosis: Sigmoid diverticulitis with microperforation  Recent right foot surgery   Discharge Diagnosis Sigmoid diverticulitis with microperforation  Perforation of small bowel by CT guided drain Protein calorie malnutrition, resolved ABL anemia, stable Tachycardia, improving UTI Recent right foot surgery  Consultants Interventional radiology Urology Internal medicine  Procedures Dr. Fredia SorrowYamagata (04/10/19) - CT guided drainage of abdominopelvic abscesses x2  Dr. Sherron MondayMacDiarmid (04/18/19) - Cystoscopy bilateral retrograde ureterograms and bilateral stents  Dr. Maisie Fushomas (04/18/19) - Exploratory laparotomy, Hartmann's procedure, small bowel resection  Hospital Course:  Patient is a 53 year old female [facility]. Workup showed acute diverticulitis with microperforation. Patient was admitted to the internal medicine service initially and general surgery consulted. Initially conservative treatment of bowel rest and antibiotics was recommended. Initial CT showed no drainable collection. Patient failed to improve, follow up CT 6/8 showed worsening diverticulitis with 2 new abscesses. IR was consulted and attempted drain placement 6/9. Patient started on TPN to supplement nutrition 6/9. Patient was still not improving 6/14, repeat CT ordered which showed improvement in diverticulitis but SBO. SBO protocol initiated 6/15 with the understanding that if she failed to improve within 48 hrs, she would require operative intervention. Patient taken to the OR 6/17 as listed above and urology was consulted for ureteral stent placement. Patient found to have perforated small bowel from drain intra-operatively and this was repaired. Patient tolerated procedure well and was returned to the floor  post-operatively. 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 Patient developed expected post-operative ileus, started having some bowel function 6/25. Patient also developed wound infection 6/25, incision opened and patient started on wet to dry dressings twice daily. NGT was able to be removed 6/28 and patient started on clear liquid diet. Diet was advanced as tolerated and TPN weaned off. Urine culture resulted 6/28 and patient started on antibiotics for this, oral antibiotics continued on discharge. Patient worked with therapies throughout admission who felt she did not need home or outpatient therapies at this time. Patient also seen by wound, ostomy, continence RN who worked with patient on colostomy care.Surgical drain removed 7/1.   On POD#14, the patient was voiding well, tolerating diet, ambulating well, pain well controlled, vital signs stable, incisions c/d/i and felt stable for discharge home.  Patient will follow up in our office in 2-3 weeks and knows to call with questions or concerns. She will call to confirm appointment date/time.    Physical Exam: Gen: Alert, NAD, pleasant Card: Regular rate and rhythm Pulm: Normal effort, clear to auscultation bilaterally Abd: Soft,appropriatelytender, non-distended,+BS, midline wound with beefy red granulation and scant drainage, stoma pink with soft brown stool output, drain in RLQ with minimal SS drainage Skin: warm and dry, no rashes  Psych: A&Ox3   Allergies as of 05/02/2019      Reactions   Codeine Nausea Only      Medication List    STOP taking these medications   meloxicam 15 MG tablet Commonly known as: MOBIC   oxyCODONE 5 MG immediate release tablet Commonly known as: Oxy IR/ROXICODONE     TAKE these medications   acetaminophen 325 MG tablet Commonly known as: TYLENOL Take 2 tablets (650 mg total) by mouth every 6 (six) hours as needed for mild pain or fever (for back  pain/pain.). What changed: reasons to take this  ALPRAZolam 0.25 MG tablet Commonly known as: XANAX Take 0.25 mg by mouth 2 (two) times daily as needed for anxiety.   Aspirin Adult Low Strength 81 MG EC tablet Generic drug: aspirin Take 81 mg by mouth daily.   cetirizine 10 MG tablet Commonly known as: ZYRTEC Take 10 mg by mouth daily.   ciprofloxacin 500 MG tablet Commonly known as: Cipro Take 1 tablet (500 mg total) by mouth 2 (two) times daily.   eletriptan 40 MG tablet Commonly known as: RELPAX Take 40 mg by mouth as needed for migraine or headache. May repeat in 2 hours if headache persists or recurs.   fluticasone 50 MCG/ACT nasal spray Commonly known as: FLONASE Place 1 spray into both nostrils daily.   gabapentin 300 MG capsule Commonly known as: NEURONTIN Take 1 capsule (300 mg total) by mouth 3 (three) times daily.   guaiFENesin 600 MG 12 hr tablet Commonly known as: MUCINEX Take 1 tablet (600 mg total) by mouth 2 (two) times daily as needed for to loosen phlegm.   ibuprofen 200 MG tablet Commonly known as: ADVIL Take 600 mg by mouth 2 (two) times daily as needed (for pain.).   LACTASE PO Take 5 mg by mouth daily as needed (for milk consumption).   methocarbamol 500 MG tablet Commonly known as: ROBAXIN Take 2 tablets (1,000 mg total) by mouth 3 (three) times daily.   montelukast 10 MG tablet Commonly known as: SINGULAIR Take 10 mg by mouth daily at 2 PM.   ondansetron 4 MG tablet Commonly known as: Zofran Take 1 tablet (4 mg total) by mouth daily as needed for nausea or vomiting. What changed: You were already taking a medication with the same name, and this prescription was added. Make sure you understand how and when to take each.   ondansetron 4 MG tablet Commonly known as: ZOFRAN Take 1 tablet (4 mg total) by mouth every 8 (eight) hours as needed for nausea. What changed: when to take this   [place] OP Apply to eye.    traMADol 50 MG tablet Commonly known as: ULTRAM Take 1-2 tablets (50-100 mg total) by mouth 3 (three) times daily as needed for moderate pain or severe pain. What changed:   how much to take  reasons to take this   valACYclovir 500 MG tablet Commonly known as: VALTREX Take 500 mg by mouth 2 (two) times daily as needed. For cold sores        Follow-up Information    Leighton Ruff, MD. Go on 04/24/7627.   Specialty: General Surgery Why: Follow up appointment scheduled for 9:00 AM. Please arrive 30 min prior to appointment time. Bring photo ID and insurance information.  Contact information: [address] [place] [address] [phone]        Bjorn Loser, MD. Call.   Specialty: Urology Why: Call and schedule an appointment to follow up ureteral stents Contact information: [place] [address] [phone]        Scifres, Earlie Server, [place]. Call.   Specialty: Physician Assistant Why: Call and schedule an appointment in the next few weeks for post-hospital admission.  Contact information: [place] [address] [phone]        Wylene Simmer, MD. Call.   Specialty: Orthopedic Surgery Why: Call and schedule follow up appointment.  Contact information: [address] [place] [address] [phone]  Signed: Wells GuilesKelly Rayburn, [facility]  [facility] 05/02/2019, 10:59 AM Pager: [phone] Consults: [phone]

## 2019-05-02 NOTE — Progress Notes (Signed)
Patient d/c to home. Discharge instructions given to patient and daughter's. Writer educated patient and both daughter's on wound care to midline. Patient and daughter's had no questions. NT took patient out to the front of the building

## 2019-05-02 NOTE — Consult Note (Signed)
[place] Nurse ostomy follow up Patient receiving care in [place].  Teaching session completed in the presence of two of her daughters and the student nurse.  All participated in performing aspects of the steps involved in removing the existing pouch, cleaning around the stoma, opening/closing the pouch, measuring the stoma, cutting a new barrier.  They observed the application of the new pouching system. Stoma type/location: LUQ colostomy Stomal assessment/size: oval, budded, 1 3/8 inch, moist, dark pink. All sutures intact, a barrier ring was used around the stoma and in the crease at the umbilicus. Peristomal assessment: intact, no irritation Treatment options for stomal/peristomal skin: barrier ring Output: soft brown stool Ostomy pouching: 2pc. Flat 2 /4 inch Education provided: All aspects of stoma care. Enrolled patient in [place] Discharge program: Yes, previously. Box is at their house.  I instructed them to open it and follow the directions to continue to receive additional supplies.  8 sets of skin barriers, pouches, and rings sent with the patient for discharge home. Val Riles, RN, MSN, CWOCN, CNS-BC, pager [phone]

## 2019-05-02 NOTE — Progress Notes (Signed)
Per Dr. Wyonia Hough note yesterday, [place] signed off. Discussed with surgery who noted pt was being d/c'd today. Please call back if any questions or concerns.  Please refer to Dr. Langley Adie note for [facility] sign off recommendations.

## 2019-05-24 ENCOUNTER — Ambulatory Visit: Payer: Self-pay | Admitting: General Surgery

## 2019-07-10 ENCOUNTER — Other Ambulatory Visit (HOSPITAL_COMMUNITY)
Admission: RE | Admit: 2019-07-10 | Discharge: 2019-07-10 | Disposition: A | Discharge status: Home / Self Care | Payer: PRIVATE HEALTH INSURANCE | Source: Ambulatory Visit | Attending: General Surgery | Admitting: General Surgery

## 2019-07-10 DIAGNOSIS — Z20828 Contact with and (suspected) exposure to other viral communicable diseases: Secondary | ICD-10-CM | POA: Insufficient documentation

## 2019-07-10 DIAGNOSIS — Z01812 Encounter for preprocedural laboratory examination: Secondary | ICD-10-CM | POA: Diagnosis not present

## 2019-07-10 LAB — SARS CORONAVIRUS 2 (TAT 6-24 HRS): SARS Coronavirus 2: NEGATIVE

## 2019-07-10 NOTE — Progress Notes (Signed)
Called patient to go over instructions for procedure for 07/12/2019 and patient's mailbox is full and unable to leave a message.

## 2019-07-11 NOTE — Progress Notes (Signed)
Attempted pre op call for procedure 07/12/19, no answer, voicemail full.

## 2019-07-12 ENCOUNTER — Encounter (HOSPITAL_COMMUNITY): Payer: Self-pay | Admitting: *Deleted

## 2019-07-12 ENCOUNTER — Encounter (HOSPITAL_COMMUNITY)
Admission: RE | Disposition: A | Discharge status: Home / Self Care | Payer: Self-pay | Source: Home / Self Care | Attending: General Surgery

## 2019-07-12 ENCOUNTER — Ambulatory Visit (HOSPITAL_COMMUNITY)
Admission: RE | Admit: 2019-07-12 | Discharge: 2019-07-12 | Disposition: A | Discharge status: Home / Self Care | Payer: PRIVATE HEALTH INSURANCE | Attending: General Surgery | Admitting: General Surgery

## 2019-07-12 ENCOUNTER — Other Ambulatory Visit: Payer: Self-pay

## 2019-07-12 DIAGNOSIS — Z79899 Other long term (current) drug therapy: Secondary | ICD-10-CM | POA: Insufficient documentation

## 2019-07-12 DIAGNOSIS — K219 Gastro-esophageal reflux disease without esophagitis: Secondary | ICD-10-CM | POA: Insufficient documentation

## 2019-07-12 DIAGNOSIS — Z87891 Personal history of nicotine dependence: Secondary | ICD-10-CM | POA: Diagnosis not present

## 2019-07-12 DIAGNOSIS — Z9049 Acquired absence of other specified parts of digestive tract: Secondary | ICD-10-CM | POA: Diagnosis not present

## 2019-07-12 DIAGNOSIS — Z791 Long term (current) use of non-steroidal anti-inflammatories (NSAID): Secondary | ICD-10-CM | POA: Diagnosis not present

## 2019-07-12 DIAGNOSIS — F419 Anxiety disorder, unspecified: Secondary | ICD-10-CM | POA: Insufficient documentation

## 2019-07-12 DIAGNOSIS — Z1211 Encounter for screening for malignant neoplasm of colon: Secondary | ICD-10-CM | POA: Diagnosis present

## 2019-07-12 DIAGNOSIS — M1990 Unspecified osteoarthritis, unspecified site: Secondary | ICD-10-CM | POA: Diagnosis not present

## 2019-07-12 DIAGNOSIS — Z885 Allergy status to narcotic agent status: Secondary | ICD-10-CM | POA: Diagnosis not present

## 2019-07-12 DIAGNOSIS — K5289 Other specified noninfective gastroenteritis and colitis: Secondary | ICD-10-CM | POA: Insufficient documentation

## 2019-07-12 DIAGNOSIS — Z96653 Presence of artificial knee joint, bilateral: Secondary | ICD-10-CM | POA: Insufficient documentation

## 2019-07-12 HISTORY — PX: COLONOSCOPY: SHX5424

## 2019-07-12 SURGERY — COLONOSCOPY
Anesthesia: Moderate Sedation

## 2019-07-12 MED ORDER — MIDAZOLAM HCL (PF) 5 MG/ML IJ SOLN
INTRAMUSCULAR | Status: AC
  Filled 2019-07-12: qty 2

## 2019-07-12 MED ORDER — FENTANYL CITRATE (PF) 100 MCG/2ML IJ SOLN
INTRAMUSCULAR | Status: DC | PRN
  Administered 2019-07-12 (×2): 25 ug via INTRAVENOUS
  Administered 2019-07-12: 50 ug via INTRAVENOUS

## 2019-07-12 MED ORDER — FENTANYL CITRATE (PF) 100 MCG/2ML IJ SOLN
INTRAMUSCULAR | Status: AC
  Filled 2019-07-12: qty 2

## 2019-07-12 MED ORDER — DIPHENHYDRAMINE HCL 50 MG/ML IJ SOLN
INTRAMUSCULAR | Status: AC
  Filled 2019-07-12: qty 1

## 2019-07-12 MED ORDER — LACTATED RINGERS IV SOLN
INTRAVENOUS | Status: DC
  Administered 2019-07-12: 08:00:00 1000 mL via INTRAVENOUS

## 2019-07-12 MED ORDER — SODIUM CHLORIDE 0.9 % IV SOLN: 250.0000 mL | INTRAVENOUS | Status: DC

## 2019-07-12 MED ORDER — MIDAZOLAM HCL (PF) 10 MG/2ML IJ SOLN
INTRAMUSCULAR | Status: DC | PRN
  Administered 2019-07-12 (×3): 2 mg via INTRAVENOUS
  Administered 2019-07-12: 1 mg via INTRAVENOUS

## 2019-07-12 NOTE — H&P (Signed)
Molly Beck is an 53 y.o. female.   Chief Complaint: screening HPI: 53 y.o. F here for screening colonoscopy after Hartman's   Past Medical History:  Diagnosis Date  . Allergy   . Anxiety   . GERD (gastroesophageal reflux disease)   . Headache    migraines  . Heartburn   . OA (osteoarthritis)    T/O  . Recurrent cold sores   . Tendon laceration    right  small finger  . Wears glasses     Past Surgical History:  Procedure Laterality Date  . COLON RESECTION N/A 04/18/2019   Procedure: EXPLORATORY LAPAROTOMY, HARTMAN'S RESECTION AND SMALL BOWEL RESECTION WITH COLOSTOMY;  Surgeon: Leighton Ruff, MD;  Location: [facility];  Service: General;  Laterality: N/A;  . CYSTOSCOPY WITH STENT PLACEMENT Bilateral 04/18/2019   Procedure: CYSTOSCOPY WITH STENT PLACEMENT;  Surgeon: Bjorn Loser, MD;  Location: [facility];  Service: Urology;  Laterality: Bilateral;  . I&D EXTREMITY Right 05/31/2017   Procedure: Right small finger irrigation and debridement and flexor tendon reconstruction;  Surgeon: Roseanne Kaufman, MD;  Location: [facility];  Service: Orthopedics;  Laterality: Right;  . KNEE ARTHROSCOPY     B/L  . REPLACEMENT TOTAL KNEE     B/L  . TENDON REPAIR     left thumb  . WISDOM TOOTH EXTRACTION      Family History  Problem Relation Age of Onset  . Macular degeneration Mother   . Pancreatic cancer Father   . Other Sister    Social History:  reports that she has quit smoking. She has never used smokeless tobacco. She reports current alcohol use. She reports that she does not use drugs.  Allergies:  Allergies  Allergen Reactions  . Codeine Nausea Only    Medications Prior to Admission  Medication Sig Dispense Refill  . acetaminophen (TYLENOL) 325 MG tablet Take 2 tablets (650 mg total) by mouth every 6 (six) hours as needed for mild pain or fever (for back pain/pain.).    Marland Kitchen ALPRAZolam (XANAX) 0.25 MG tablet Take 0.25 mg by mouth 2 (two) times daily as needed for anxiety.    .  cetirizine (ZYRTEC) 10 MG tablet Take 10 mg by mouth daily.    . cholecalciferol (VITAMIN D3) 25 MCG (1000 UT) tablet Take 1,000 Units by mouth 2 (two) times daily.     Marland Kitchen eletriptan (RELPAX) 40 MG tablet Take 40 mg by mouth as needed for migraine or headache. May repeat in 2 hours if headache persists or recurs.    . Estradiol (DIVIGEL) 0.5 UR/4.2HC GEL Place 1 application onto the skin daily.    . fluticasone (FLONASE) 50 MCG/ACT nasal spray Place 1 spray into both nostrils daily.    Marland Kitchen guaiFENesin (MUCINEX) 600 MG 12 hr tablet Take 1 tablet (600 mg total) by mouth 2 (two) times daily as needed for to loosen phlegm.    . Homeopathic Products ([facility]) Apply to eye.    Marland Kitchen LACTASE PO Take 5 mg by mouth daily as needed (for milk consumption).    . magnesium oxide (MAG-OX) 400 MG tablet Take 400 mg by mouth daily.    . meloxicam (MOBIC) 15 MG tablet Take 15 mg by mouth daily.    . montelukast (SINGULAIR) 10 MG tablet Take 10 mg by mouth daily at 2 PM.    . ondansetron (ZOFRAN) 4 MG tablet Take 1 tablet (4 mg total) by mouth every 8 (eight) hours as needed for nausea. [place]  tablet 0  . traMADol (ULTRAM) 50 MG tablet Take 1-2 tablets (50-100 mg total) by mouth 3 (three) times daily as needed for moderate pain or severe pain. 30 tablet 0  . valACYclovir (VALTREX) 500 MG tablet Take 500 mg by mouth 2 (two) times daily as needed. For cold sores    . zinc gluconate 50 MG tablet Take 50 mg by mouth daily.      Results for orders placed or performed during the hospital encounter of 07/10/19 (from the past 48 hour(s))  SARS CORONAVIRUS 2 (TAT 6-24 HRS) Nasopharyngeal Nasopharyngeal Swab     Status: None   Collection Time: 07/10/19  9:51 AM   Specimen: Nasopharyngeal Swab  Result Value Ref Range   SARS Coronavirus 2 NEGATIVE NEGATIVE    Comment: (NOTE) SARS-CoV-2 target nucleic acids are NOT DETECTED. The SARS-CoV-2 RNA is generally detectable in upper and lower respiratory specimens  during the acute phase of infection. Negative results do not preclude SARS-CoV-2 infection, do not rule out co-infections with other pathogens, and should not be used as the sole basis for treatment or other patient management decisions. Negative results must be combined with clinical observations, patient history, and epidemiological information. The expected result is Negative. Fact Sheet for Patients: HairSlick.nohttps://www.fda.gov/media/138098/download Fact Sheet for Healthcare Providers: quierodirigir.comhttps://www.fda.gov/media/138095/download This test is not yet approved or cleared by the [place] FDA and  has been authorized for detection and/or diagnosis of SARS-CoV-2 by FDA under an Emergency Use Authorization (EUA). This EUA will remain  in effect (meaning this test can be used) for the duration of the COVID-19 declaration under Section 56 4(b)(1) of the Act, 21 U.S.C. section 360bbb-3(b)(1), unless the authorization is terminated or revoked sooner. Performed at [facility], [address] [place], [place] [address]    No results found.  Review of Systems  Constitutional: Negative for chills.  HENT: Negative for congestion and hearing loss.   Eyes: Negative for blurred vision and double vision.  Respiratory: Negative for cough and shortness of breath.   Cardiovascular: Negative for chest pain and palpitations.  Gastrointestinal: Negative for abdominal pain, nausea and vomiting.  Genitourinary: Negative for dysuria and urgency.  Neurological: Negative for dizziness and headaches.    Blood pressure (!) 144/97, pulse 72, temperature 97.8 F (36.6 C), temperature source Oral, resp. rate 17, height 5\' 4"  (1.626 m), weight 74.4 kg, SpO2 100 %. Physical Exam  Vitals reviewed. Constitutional: She appears well-developed and well-nourished.  HENT:  Head: Normocephalic and atraumatic.  Eyes: Pupils are equal, round, and reactive to light. Conjunctivae and EOM are normal.  Neck: Normal  range of motion.  Cardiovascular: Normal rate and regular rhythm.  Respiratory: Effort normal and breath sounds normal. No respiratory distress.  GI: Soft. She exhibits no distension.  Musculoskeletal: Normal range of motion.  Neurological: She is alert.     Assessment/Plan 53 y.o. F s/p emergent Hartman's.  Now hear for screening colonoscopy.  Risks include bleeding, perforation, missed pathology and inability to complete the procedure.  I believe she understands this and wishes to proceed.   Vanita PandaAlicia C Mahmud Keithly, MD 07/12/2019, 8:45 AM

## 2019-07-12 NOTE — Discharge Instructions (Signed)
Post Colonoscopy Instructions ° °1. DIET: Follow a light bland diet the first 24 hours after arrival home, such as soup, liquids, crackers, etc.  Be sure to include lots of fluids daily.  Avoid fast food or heavy meals as your are more likely to get nauseated.   °2. You may have some mild rectal bleeding for the first few days after the procedure.  This should get less and less with time.  Resume any blood thinners 2 days after your procedure unless directed otherwise by your physician. °3. Take your usually prescribed home medications unless otherwise directed. °a. If you have any pain, it is helpful to get up and walk around, as it is usually from excess gas. °b. If this is not helpful, you can take an over-the-counter pain medication.  Choose one of the following that works best for you: °i. Naproxen (Aleve, etc)  Two 220mg tabs twice a day °ii. Ibuprofen (Advil, etc) Three 200mg tabs four times a day (every meal & bedtime) °iii. If you still have pain after using one of these, please call the office °4. It is normal to not have a bowel movement for 2-3 days after colonoscopy.   ° °5. ACTIVITIES as tolerated:   °6. You may resume regular (light) daily activities beginning the next day--such as daily self-care, walking, climbing stairs--gradually increasing activities as tolerated.  ° ° °WHEN TO CALL US [phone]: °1. Fever over 101.5 F (38.5 C)  °2. Severe abdominal or chest pain  °3. Large amount of rectal bleeding, passing multiple blood clots  °4. Dizziness or shortness of breath °5. Increasing nausea or vomiting ° ° The clinic staff is available to answer your questions during regular business hours (8:30am-5pm).  Please don’t hesitate to call and ask to speak to one of our nurses for clinical concerns.  ° If you have a medical emergency, go to the nearest emergency room or call 911. ° A surgeon from [facility] is always on call at the hospitals ° ° °[facility] °[address], [address], [place], [place]  [address] ? °MAIN: [phone] ? TOLL FREE: [phone] ?  °FAX [phone] °[email or web address] ° ° °

## 2019-07-12 NOTE — Op Note (Signed)
[facility] Patient Name: Molly DikeCarol Beck Procedure Date: 07/12/2019 MRN: [id number] Attending MD: Romie LeveeAlicia Habiba Treloar , MD Date of Birth: 06/04/1966 CSN: [id number] Age: [age] Admit Type: Outpatient Procedure:                Colonoscopy Indications:              Screening for colorectal malignant neoplasm Providers:                Romie LeveeAlicia Brissia Delisa, MD, Vicki MalletBrandy Grace, RN, Kandice RobinsonsGuillaume                            [place], Technician Referring MD:              Medicines:                Fentanyl 100 micrograms IV, Midazolam 7 mg IV Complications:            No immediate complications. Estimated Blood Loss:     Estimated blood loss was minimal. Procedure:                Pre-Anesthesia Assessment:                           - Prior to the procedure, a History and Physical                            was performed, and patient medications and                            allergies were reviewed. The patient's tolerance of                            previous anesthesia was also reviewed. The risks                            and benefits of the procedure and the sedation                            options and risks were discussed with the patient.                            All questions were answered, and informed consent                            was obtained. Prior Anticoagulants: The patient has                            taken no previous anticoagulant or antiplatelet                            agents. ASA Grade Assessment: II - A patient with                            mild systemic disease. After reviewing the risks  and benefits, the patient was deemed in                            satisfactory condition to undergo the procedure.                           After obtaining informed consent, the colonoscope                            was passed under direct vision. Throughout the                            procedure, the patient's blood pressure, pulse, and                     oxygen saturations were monitored continuously. The                            CF-HQ190L ([id number]) Olympus colonoscope was                            introduced through the anus and advanced to the the                            terminal ileum, with identification of the                            appendiceal orifice and IC valve. The colonoscopy                            was somewhat difficult due to significant looping.                            Successful completion of the procedure was aided by                            increasing the dose of sedation medication and                            using scope torsion. The patient tolerated the                            procedure. The quality of the bowel preparation was                            excellent. The terminal ileum, ileocecal valve,                            appendiceal orifice, and rectum were photographed.                            The entire colon was well visualized. Scope  withdrawal time was 20 minutes. Scope In: 8:59:25 AM Scope Out: 9:15:45 AM Scope Withdrawal Time: 0 hours 3 minutes 22 seconds  Total Procedure Duration: 0 hours 16 minutes 20 seconds  Findings:      The perianal and digital rectal examinations were normal. Pertinent       negatives include normal sphincter tone.      The entire examined colon appeared normal. Impression:               - The entire examined colon is normal.                           - No specimens collected.                           - Localized mild inflammation was found in the                            rectum secondary to diversion colitis. Moderate Sedation:      Moderate (conscious) sedation was administered by the endoscopy nurse       and supervised by the endoscopist. The following parameters were       monitored: oxygen saturation, heart rate, blood pressure, and response       to care. Recommendation:           - Discharge  patient to home (ambulatory).                           - Resume previous diet.                           - Continue present medications.                           - Repeat colonoscopy in 10 years for screening                            purposes.                           - Return to my office in 1 month to discuss ostomy                            reversal. Procedure Code(s):        --- Professional ---                           E3329, Colorectal cancer screening; colonoscopy on                            individual not meeting criteria for high risk Diagnosis Code(s):        --- Professional ---                           Z12.11, Encounter for screening for malignant                            neoplasm of colon  K52.89, Other specified noninfective                            gastroenteritis and colitis CPT copyright 2019 American Medical Association. All rights reserved. The codes documented in this report are preliminary and upon coder review may  be revised to meet current compliance requirements. Romie LeveeAlicia Tecla Mailloux, MD Romie LeveeAlicia Zen Cedillos, MD 07/12/2019 9:29:36 AM This report has been signed electronically. Number of Addenda: 0

## 2019-08-06 ENCOUNTER — Ambulatory Visit: Payer: Self-pay | Admitting: General Surgery

## 2019-08-31 NOTE — H&P (Signed)
TOTAL HIP ADMISSION H&P  Patient is admitted for left total hip arthroplasty, anterior approach.  Subjective:  Chief Complaint: Left hip primary OA / pain  HPI: Molly Beck, 53 y.o. female, has a history of pain and functional disability in the left hip(s) due to arthritis and patient has failed non-surgical conservative treatments for greater than 12 weeks to include NSAID's and/or analgesics, use of assistive devices and activity modification.  Onset of symptoms was gradual starting 1-1.5 years ago with gradually worsening course since that time.The patient noted no past surgery on the left hip(s).  Patient currently rates pain in the left hip at 10 out of 10 with activity. Patient has night pain, worsening of pain with activity and weight bearing, trendelenberg gait, pain that interfers with activities of daily living and pain with passive range of motion. Patient has evidence of periarticular osteophytes and joint space narrowing by imaging studies. This condition presents safety issues increasing the risk of falls.  There is no current active infection.  Risks, benefits and expectations were discussed with the patient.  Risks including but not limited to the risk of anesthesia, blood clots, nerve damage, blood vessel damage, failure of the prosthesis, infection and up to and including death.  Patient understand the risks, benefits and expectations and wishes to proceed with surgery.   PCP: Scifres, [name], [name]  D/C Plans:       Home  Post-op Meds:       No Rx given   Tranexamic Acid:      To be given - IV   Decadron:      Is to be given  FYI:      ASA (No Rx needed)  Norco  DME:   Pt already has equipment  PT:   [facility]  [phone]    Patient Active Problem List   Diagnosis Date Noted  . Intraabdominal fluid collection   . Encounter for imaging study to confirm nasogastric (NG) tube placement   . Small bowel obstruction (HCC)   . SBO  (small bowel obstruction) (HCC)   . Perforated sigmoid colon  04/09/2019  . Colitis 04/05/2019  . Sepsis (HCC) 04/05/2019  . Hypokalemia 04/05/2019  . H/O foot surgery 04/05/2019   Past Medical History:  Diagnosis Date  . Allergy   . Anxiety   . GERD (gastroesophageal reflux disease)   . Headache    migraines  . Heartburn   . OA (osteoarthritis)    T/O  . Recurrent cold sores   . Tendon laceration    right  small finger  . Wears glasses     Past Surgical History:  Procedure Laterality Date  . COLON RESECTION N/A 04/18/2019   Procedure: EXPLORATORY LAPAROTOMY, HARTMAN'S RESECTION AND SMALL BOWEL RESECTION WITH COLOSTOMY;  Surgeon: Romie Leveehomas, Alicia, MD;  Location: [facility];  Service: General;  Laterality: N/A;  . COLONOSCOPY N/A 07/12/2019   Procedure: COLONOSCOPY THROUGH OSTOMY;  Surgeon: Romie Leveehomas, Alicia, MD;  Location: [facility];  Service: Endoscopy;  Laterality: N/A;  IV SEDATION BY SURGEON  . CYSTOSCOPY WITH STENT PLACEMENT Bilateral 04/18/2019   Procedure: CYSTOSCOPY WITH STENT PLACEMENT;  Surgeon: Alfredo MartinezMacDiarmid, Scott, MD;  Location: [facility];  Service: Urology;  Laterality: Bilateral;  . I&D EXTREMITY Right 05/31/2017   Procedure: Right small finger irrigation and debridement and flexor tendon reconstruction;  Surgeon: Dominica SeverinGramig, William, MD;  [facility];  Service: Orthopedics;  Laterality: Right;  . KNEE ARTHROSCOPY     B/L  .  REPLACEMENT TOTAL KNEE     B/L  . TENDON REPAIR     left thumb  . WISDOM TOOTH EXTRACTION      No current facility-administered medications for this encounter.    Current Outpatient Medications  Medication Sig Dispense Refill Last Dose  . acetaminophen (TYLENOL) 325 MG tablet Take 2 tablets (650 mg total) by mouth every 6 (six) hours as needed for mild pain or fever (for back pain/pain.).   Past Week at Unknown time  . ALPRAZolam (XANAX) 0.25 MG tablet Take 0.25 mg by mouth 2 (two) times daily as needed for anxiety.   Past Week at Unknown time  .  cetirizine (ZYRTEC) 10 MG tablet Take 10 mg by mouth daily.   07/11/2019 at Unknown time  . cholecalciferol (VITAMIN D3) 25 MCG (1000 UT) tablet Take 1,000 Units by mouth 2 (two) times daily.    Past Week at Unknown time  . eletriptan (RELPAX) 40 MG tablet Take 40 mg by mouth as needed for migraine or headache. May repeat in 2 hours if headache persists or recurs.   Past Month at Unknown time  . Estradiol (DIVIGEL) 0.5 MG/0.5GM GEL Place 1 application onto the skin daily.   Past Month at Unknown time  . fluticasone (FLONASE) 50 MCG/ACT nasal spray Place 1 spray into both nostrils daily.   Past Week at Unknown time  . guaiFENesin (MUCINEX) 600 MG 12 hr tablet Take 1 tablet (600 mg total) by mouth 2 (two) times daily as needed for to loosen phlegm.   Past Week at Unknown time  . Homeopathic Products [facility] RELIE[facility] OP) Apply to eye.   Past Week at Unknown time  . LACTASE PO Take 5 mg by mouth daily as needed (for milk consumption).   Past Week at Unknown time  . magnesium oxide (MAG-OX) 400 MG tablet Take 400 mg by mouth daily.   Past Week at Unknown time  . meloxicam (MOBIC) 15 MG tablet Take 15 mg by mouth daily.   Past Week at Unknown time  . montelukast (SINGULAIR) 10 MG tablet Take 10 mg by mouth daily at 2 PM.   Past Week at Unknown time  . ondansetron (ZOFRAN) 4 MG tablet Take 1 tablet (4 mg total) by mouth every 8 (eight) hours as needed for nausea. 20 tablet 0 Past Week at Unknown time  . traMADol (ULTRAM) 50 MG tablet Take 1-2 tablets (50-100 mg total) by mouth 3 (three) times daily as needed for moderate pain or severe pain. 30 tablet 0 Past Week at Unknown time  . valACYclovir (VALTREX) 500 MG tablet Take 500 mg by mouth 2 (two) times daily as needed. For cold sores   Past Month at Unknown time  . zinc gluconate 50 MG tablet Take 50 mg by mouth daily.   07/11/2019 at Unknown time   Allergies  Allergen Reactions  . Codeine Nausea Only    Social History   Tobacco Use  .  Smoking status: Former Games developer  . Smokeless tobacco: Never Used  . Tobacco comment: during college only  Substance Use Topics  . Alcohol use: Yes    Comment: occasional    Family History  Problem Relation Age of Onset  . Macular degeneration Mother   . Pancreatic cancer Father   . Other Sister      Review of Systems  Constitutional: Negative.   HENT: Negative.   Eyes: Negative.   Respiratory: Negative.   Cardiovascular: Negative.   Gastrointestinal: Negative.  Genitourinary: Negative.   Musculoskeletal: Positive for joint pain.  Skin: Negative.   Neurological: Negative.   Endo/Heme/Allergies: Positive for environmental allergies.  Psychiatric/Behavioral: The patient is nervous/anxious.     Objective:  Physical Exam  Constitutional: She is oriented to person, place, and time. She appears well-developed.  HENT:  Head: Normocephalic.  Eyes: Pupils are equal, round, and reactive to light.  Neck: Neck supple. No JVD present. No tracheal deviation present. No thyromegaly present.  Cardiovascular: Normal rate, regular rhythm and intact distal pulses.  Respiratory: Effort normal and breath sounds normal. No respiratory distress. She has no wheezes.  GI: Soft. There is no abdominal tenderness. There is no guarding.  Musculoskeletal:     Left hip: She exhibits decreased range of motion, decreased strength, tenderness and bony tenderness. She exhibits no swelling, no deformity and no laceration.  Lymphadenopathy:    She has no cervical adenopathy.  Neurological: She is alert and oriented to person, place, and time.  Skin: Skin is warm and dry.  Psychiatric: She has a normal mood and affect.      Labs:  Estimated body mass index is 28.15 kg/m as calculated from the following:   Height as of 07/12/19: 5\' 4"  (1.626 m).   Weight as of 07/12/19: 74.4 kg.   Imaging Review Plain radiographs demonstrate severe degenerative joint disease of the left hip. The bone quality appears  to be good for age and reported activity level.      Assessment/Plan:  End stage arthritis, left hip  The patient history, physical examination, clinical judgement of the provider and imaging studies are consistent with end stage degenerative joint disease of the left hip and total hip arthroplasty is deemed medically necessary. The treatment options including medical management, injection therapy, arthroscopy and arthroplasty were discussed at length. The risks and benefits of total hip arthroplasty were presented and reviewed. The risks due to aseptic loosening, infection, stiffness, dislocation/subluxation,  thromboembolic complications and other imponderables were discussed.  The patient acknowledged the explanation, agreed to proceed with the plan and consent was signed. Patient is being admitted for inpatient treatment for surgery, pain control, PT, OT, prophylactic antibiotics, VTE prophylaxis, progressive ambulation and ADL's and discharge planning.The patient is planning to be discharged home.    West Pugh Kolbe [place]   [name]  08/31/2019, 1:11 PM

## 2019-09-13 NOTE — Patient Instructions (Signed)
DUE TO COVID-19 ONLY ONE VISITOR IS ALLOWED TO COME WITH YOU AND STAY IN THE WAITING ROOM ONLY DURING PRE OP AND PROCEDURE DAY OF SURGERY. THE 1 VISITOR MAY VISIT WITH YOU AFTER SURGERY IN YOUR PRIVATE ROOM DURING VISITING HOURS ONLY!  YOU NEED TO HAVE A COVID 19 TEST ON_______ @_______ , THIS TEST MUST BE DONE BEFORE SURGERY, COME  [address], [place] , [phone].  [facility]) ONCE YOUR COVID TEST IS COMPLETED, PLEASE BEGIN THE QUARANTINE INSTRUCTIONS AS OUTLINED IN YOUR HANDOUT.                Molly ReiningCarol T Beck  09/13/2019   Your procedure is scheduled on: 09-18-19   Report to [facility] Main  Entrance   Report to admitting at        1025 AM     Call this number if you have problems the morning of surgery [phone]    Remember: NO SOLID FOOD AFTER MIDNIGHT THE NIGHT PRIOR TO SURGERY. NOTHING BY MOUTH EXCEPT CLEAR LIQUIDS UNTIL   1000 am . PLEASE FINISH ENSURE DRINK PER SURGEON ORDER  WHICH NEEDS TO BE COMPLETED AT     1000 am then nothing by mouth .     CLEAR LIQUID DIET   Foods Allowed                                                                                     Foods Excluded  Coffee and tea, regular and decaf    NO CREAMER                         liquids that you cannot  Plain Jell-O any favor except red or purple                                           see through such as: Fruit ices (not with fruit pulp)                                                             milk, soups, orange juice  Iced Popsicles                                                           All solid food Carbonated beverages, regular and diet                                    Cranberry, grape and apple juices Sports drinks like Gatorade Lightly seasoned clear broth or consume(fat free) Sugar, honey syrup  _____________________________________________________________________  BRUSH YOUR TEETH MORNING OF SURGERY AND RINSE YOUR MOUTH OUT, NO CHEWING  GUM CANDY OR MINTS.     Take these medicines the morning of surgery with A SIP OF WATER: flonase, zyrtec, xanax if needed                                 You may not have any metal on your body including hair pins and              piercings  Do not wear jewelry, make-up, lotions, powders or perfumes, deodorant             Do not wear nail polish on your fingernails.  Do not shave  48 hours prior to surgery.                 Do not bring valuables to the hospital. [facility] IS NOT             RESPONSIBLE   FOR VALUABLES.  Contacts, dentures or bridgework may not be worn into surgery.  ___________________________________________________________________           [facility] - Preparing for Surgery Before surgery, you can play an important role.  Because skin is not sterile, your skin needs to be as free of germs as possible.  You can reduce the number of germs on your skin by washing with CHG (chlorahexidine gluconate) soap before surgery.  CHG is an antiseptic cleaner which kills germs and bonds with the skin to continue killing germs even after washing. Please DO NOT use if you have an allergy to CHG or antibacterial soaps.  If your skin becomes reddened/irritated stop using the CHG and inform your nurse when you arrive at Short Stay. Do not shave (including legs and underarms) for at least 48 hours prior to the first CHG shower.  You may shave your face/neck. Please follow these instructions carefully:  1.  Shower with CHG Soap the night before surgery and the  morning of Surgery.  2.  If you choose to wash your hair, wash your hair first as usual with your  normal  shampoo.  3.  After you shampoo, rinse your hair and body thoroughly to remove the  shampoo.                           4.  Use CHG as you would any other liquid soap.  You can apply chg directly  to the skin and wash                       Gently with a scrungie or clean washcloth.  5.  Apply the CHG Soap to your body ONLY  FROM THE NECK DOWN.   Do not use on face/ open                           Wound or open sores. Avoid contact with eyes, ears mouth and genitals (private parts).                       Wash face,  Genitals (private parts) with your normal soap.             6.  Wash thoroughly, paying special attention to the area where your surgery  will be performed.  7.  Thoroughly rinse your body with warm water from the neck down.  8.  DO NOT shower/wash with your normal soap after using and rinsing off  the CHG Soap.                9.  Pat yourself dry with a clean towel.            10.  Wear clean pajamas.            11.  Place clean sheets on your bed the night of your first shower and do not  sleep with pets. Day of Surgery : Do not apply any lotions/deodorants the morning of surgery.  Please wear clean clothes to the hospital/surgery center.  FAILURE TO FOLLOW THESE INSTRUCTIONS MAY RESULT IN THE CANCELLATION OF YOUR SURGERY PATIENT SIGNATURE_________________________________  NURSE SIGNATURE__________________________________  ________________________________________________________________________   Molly Beck  An incentive spirometer is a tool that can help keep your lungs clear and active. This tool measures how well you are filling your lungs with each breath. Taking long deep breaths may help reverse or decrease the chance of developing breathing (pulmonary) problems (especially infection) following:  A long period of time when you are unable to move or be active. BEFORE THE PROCEDURE   If the spirometer includes an indicator to show your best effort, your nurse or respiratory therapist will set it to a desired goal.  If possible, sit up straight or lean slightly forward. Try not to slouch.  Hold the incentive spirometer in an upright position. INSTRUCTIONS FOR USE  1. Sit on the edge of your bed if possible, or sit up as far as you can in bed or on a chair. 2. Hold the incentive  spirometer in an upright position. 3. Breathe out normally. 4. Place the mouthpiece in your mouth and seal your lips tightly around it. 5. Breathe in slowly and as deeply as possible, raising the piston or the ball toward the top of the column. 6. Hold your breath for 3-5 seconds or for as long as possible. Allow the piston or ball to fall to the bottom of the column. 7. Remove the mouthpiece from your mouth and breathe out normally. 8. Rest for a few seconds and repeat Steps 1 through 7 at least 10 times every 1-2 hours when you are awake. Take your time and take a few normal breaths between deep breaths. 9. The spirometer may include an indicator to show your best effort. Use the indicator as a goal to work toward during each repetition. 10. After each set of 10 deep breaths, practice coughing to be sure your lungs are clear. If you have an incision (the cut made at the time of surgery), support your incision when coughing by placing a pillow or rolled up towels firmly against it. Once you are able to get out of bed, walk around indoors and cough well. You may stop using the incentive spirometer when instructed by your caregiver.  RISKS AND COMPLICATIONS  Take your time so you do not get dizzy or light-headed.  If you are in pain, you may need to take or ask for pain medication before doing incentive spirometry. It is harder to take a deep breath if you are having pain. AFTER USE  Rest and breathe slowly and easily.  It can be helpful to keep track of a log of your progress. Your caregiver can provide you with a simple table to help with this. If you are using  the spirometer at home, follow these instructions: [place] IF:   You are having difficultly using the spirometer.  You have trouble using the spirometer as often as instructed.  Your pain medication is not giving enough relief while using the spirometer.  You develop fever of 100.5 F (38.1 C) or higher. SEEK  IMMEDIATE MEDICAL CARE IF:   You cough up bloody sputum that had not been present before.  You develop fever of 102 F (38.9 C) or greater.  You develop worsening pain at or near the incision site. MAKE SURE YOU:   Understand these instructions.  Will watch your condition.  Will get help right away if you are not doing well or get worse. Document Released: 02/28/2007 Document Revised: 01/10/2012 Document Reviewed: 05/01/2007 ExitCare Patient Information 2014 ExitCare, [place].   ________________________________________________________________________  WHAT IS A BLOOD TRANSFUSION? Blood Transfusion Information  A transfusion is the replacement of blood or some of its parts. Blood is made up of multiple cells which provide different functions.  Red blood cells carry oxygen and are used for blood loss replacement.  White blood cells fight against infection.  Platelets control bleeding.  Plasma helps clot blood.  Other blood products are available for specialized needs, such as hemophilia or other clotting disorders. BEFORE THE TRANSFUSION  Who gives blood for transfusions?   Healthy volunteers who are fully evaluated to make sure their blood is safe. This is blood bank blood. Transfusion therapy is the safest it has ever been in the practice of medicine. Before blood is taken from a donor, a complete history is taken to make sure that person has no history of diseases nor engages in risky social behavior (examples are intravenous drug use or sexual activity with multiple partners). The donor's travel history is screened to minimize risk of transmitting infections, such as malaria. The donated blood is tested for signs of infectious diseases, such as HIV and hepatitis. The blood is then tested to be sure it is compatible with you in order to minimize the chance of a transfusion reaction. If you or a relative donates blood, this is often done in anticipation of surgery and is not  appropriate for emergency situations. It takes many days to process the donated blood. RISKS AND COMPLICATIONS Although transfusion therapy is very safe and saves many lives, the main dangers of transfusion include:   Getting an infectious disease.  Developing a transfusion reaction. This is an allergic reaction to something in the blood you were given. Every precaution is taken to prevent this. The decision to have a blood transfusion has been considered carefully by your caregiver before blood is given. Blood is not given unless the benefits outweigh the risks. AFTER THE TRANSFUSION  Right after receiving a blood transfusion, you will usually feel much better and more energetic. This is especially true if your red blood cells have gotten low (anemic). The transfusion raises the level of the red blood cells which carry oxygen, and this usually causes an energy increase.  The nurse administering the transfusion will monitor you carefully for complications. HOME CARE INSTRUCTIONS  No special instructions are needed after a transfusion. You may find your energy is better. Speak with your caregiver about any limitations on activity for underlying diseases you may have. SEEK MEDICAL CARE IF:   Your condition is not improving after your transfusion.  You develop redness or irritation at the intravenous (IV) site. SEEK IMMEDIATE MEDICAL CARE IF:  Any of the following symptoms occur  over the next 12 hours:  Shaking chills.  You have a temperature by mouth above 102 F (38.9 C), not controlled by medicine.  Chest, back, or muscle pain.  People around you feel you are not acting correctly or are confused.  Shortness of breath or difficulty breathing.  Dizziness and fainting.  You get a rash or develop hives.  You have a decrease in urine output.  Your urine turns a dark color or changes to pink, red, or brown. Any of the following symptoms occur over the next 10 days:  You have a  temperature by mouth above 102 F (38.9 C), not controlled by medicine.  Shortness of breath.  Weakness after normal activity.  The white part of the eye turns yellow (jaundice).  You have a decrease in the amount of urine or are urinating less often.  Your urine turns a dark color or changes to pink, red, or brown. Document Released: 10/15/2000 Document Revised: 01/10/2012 Document Reviewed: 06/03/2008 [facility] Patient Information 2014 [place], [place].  _______________________________________________________________________

## 2019-09-14 ENCOUNTER — Encounter (HOSPITAL_COMMUNITY)
Admission: RE | Admit: 2019-09-14 | Discharge: 2019-09-14 | Disposition: A | Discharge status: Home / Self Care | Payer: PRIVATE HEALTH INSURANCE | Source: Ambulatory Visit | Attending: Orthopedic Surgery | Admitting: Orthopedic Surgery

## 2019-09-14 ENCOUNTER — Other Ambulatory Visit (HOSPITAL_COMMUNITY)
Admission: RE | Admit: 2019-09-14 | Discharge: 2019-09-14 | Disposition: A | Discharge status: Home / Self Care | Payer: PRIVATE HEALTH INSURANCE | Source: Ambulatory Visit | Attending: Orthopedic Surgery | Admitting: Orthopedic Surgery

## 2019-09-14 ENCOUNTER — Encounter (HOSPITAL_COMMUNITY): Payer: Self-pay

## 2019-09-14 ENCOUNTER — Other Ambulatory Visit: Payer: Self-pay

## 2019-09-14 DIAGNOSIS — Z20828 Contact with and (suspected) exposure to other viral communicable diseases: Secondary | ICD-10-CM | POA: Insufficient documentation

## 2019-09-14 DIAGNOSIS — Z01812 Encounter for preprocedural laboratory examination: Secondary | ICD-10-CM | POA: Insufficient documentation

## 2019-09-14 HISTORY — DX: Nausea with vomiting, unspecified: R11.2

## 2019-09-14 HISTORY — DX: Other specified postprocedural states: Z98.890

## 2019-09-14 LAB — CBC
HCT: 40.7 % (ref 36.0–46.0)
Hemoglobin: 13.2 g/dL (ref 12.0–15.0)
MCH: 30.1 pg (ref 26.0–34.0)
MCHC: 32.4 g/dL (ref 30.0–36.0)
MCV: 92.9 fL (ref 80.0–100.0)
Platelets: 265 10*3/uL (ref 150–400)
RBC: 4.38 MIL/uL (ref 3.87–5.11)
RDW: 13.6 % (ref 11.5–15.5)
WBC: 4.2 10*3/uL (ref 4.0–10.5)
nRBC: 0 % (ref 0.0–0.2)

## 2019-09-14 LAB — BASIC METABOLIC PANEL
Anion gap: 7 (ref 5–15)
BUN: 21 mg/dL — ABNORMAL HIGH (ref 6–20)
CO2: 26 mmol/L (ref 22–32)
Calcium: 8.8 mg/dL — ABNORMAL LOW (ref 8.9–10.3)
Chloride: 105 mmol/L (ref 98–111)
Creatinine, Ser: 0.6 mg/dL (ref 0.44–1.00)
GFR calc Af Amer: >60 mL/min (ref >60)
GFR calc non Af Amer: >60 mL/min (ref >60)
Glucose, Bld: 92 mg/dL (ref 70–99)
Potassium: 4.1 mmol/L (ref 3.5–5.1)
Sodium: 138 mmol/L (ref 135–145)

## 2019-09-14 LAB — SURGICAL PCR SCREEN
MRSA, PCR: NEGATIVE
Staphylococcus aureus: NEGATIVE

## 2019-09-14 LAB — ABO/RH: ABO/RH(D): A NEG

## 2019-09-14 NOTE — Progress Notes (Signed)
PCP - Dorthy Scifres [name] Cardiologist -   Chest x-ray -     EKG - 04-24-19 epic Stress Test -  ECHO -  Cardiac Cath -   Sleep Study -  CPAP -   Fasting Blood Sugar -  Checks Blood Sugar _____ times a day  Blood Thinner Instructions: Aspirin Instructions: Last Dose:  Anesthesia review:   Patient denies shortness of breath, fever, cough and chest pain at PAT appointment  NONE   Patient verbalized understanding of instructions that were given to them at the PAT appointment. Patient was also instructed that they will need to review over the PAT instructions again at home before surgery.

## 2019-09-16 LAB — NOVEL CORONAVIRUS, NAA (HOSP ORDER, SEND-OUT TO REF LAB; TAT 18-24 HRS): SARS-CoV-2, NAA: NOT DETECTED

## 2019-09-17 MED ORDER — BUPIVACAINE LIPOSOME 1.3 % IJ SUSP
20.0000 mL | Freq: Once | INTRAMUSCULAR | Status: DC
  Filled 2019-09-17: qty 20

## 2019-09-17 NOTE — Anesthesia Preprocedure Evaluation (Addendum)
Anesthesia Evaluation  Patient identified by MRN, date of birth, ID band Patient awake    Reviewed: Allergy & Precautions, NPO status , Patient's Chart, lab work & pertinent test results  History of Anesthesia Complications (+) PONV and history of anesthetic complications  Airway Mallampati: II  TM Distance: >3 FB Neck ROM: Full    Dental no notable dental hx. (+) Teeth Intact   Pulmonary neg pulmonary ROS, former smoker,    Pulmonary exam normal breath sounds clear to auscultation       Cardiovascular Exercise Tolerance: Good Normal cardiovascular exam Rhythm:Regular Rate:Normal     Neuro/Psych  Headaches, Anxiety    GI/Hepatic Neg liver ROS, GERD  ,  Endo/Other  negative endocrine ROS  Renal/GU Cr 0.60 K+ 4.1     Musculoskeletal  (+) Arthritis , Osteoarthritis,    Abdominal   Peds  Hematology Cr 13.2 Plt 285   Anesthesia Other Findings   Reproductive/Obstetrics                           Anesthesia Physical Anesthesia Plan  ASA: I  Anesthesia Plan: Spinal   Post-op Pain Management:    Induction:   PONV Risk Score and Plan: 3 and Treatment may vary due to age or medical condition, Ondansetron, Dexamethasone and Midazolam  Airway Management Planned: Nasal Cannula and Natural Airway  Additional Equipment: None  Intra-op Plan:   Post-operative Plan:   Informed Consent: I have reviewed the patients History and Physical, chart, labs and discussed the procedure including the risks, benefits and alternatives for the proposed anesthesia with the patient or authorized representative who has indicated his/her understanding and acceptance.     Dental advisory given  Plan Discussed with: CRNA  Anesthesia Plan Comments: (L THR under Spinal)       Anesthesia Quick Evaluation

## 2019-09-18 ENCOUNTER — Inpatient Hospital Stay (HOSPITAL_COMMUNITY): Payer: PRIVATE HEALTH INSURANCE

## 2019-09-18 ENCOUNTER — Encounter (HOSPITAL_COMMUNITY)
Admission: RE | Disposition: A | Discharge status: Home / Self Care | Payer: Self-pay | Source: Home / Self Care | Attending: Orthopedic Surgery

## 2019-09-18 ENCOUNTER — Other Ambulatory Visit: Payer: Self-pay

## 2019-09-18 ENCOUNTER — Telehealth (HOSPITAL_COMMUNITY): Payer: Self-pay | Admitting: *Deleted

## 2019-09-18 ENCOUNTER — Inpatient Hospital Stay (HOSPITAL_COMMUNITY): Payer: PRIVATE HEALTH INSURANCE | Admitting: Physician Assistant

## 2019-09-18 ENCOUNTER — Inpatient Hospital Stay (HOSPITAL_COMMUNITY): Payer: PRIVATE HEALTH INSURANCE | Admitting: Anesthesiology

## 2019-09-18 ENCOUNTER — Encounter (HOSPITAL_COMMUNITY): Payer: Self-pay | Admitting: Certified Registered Nurse Anesthetist

## 2019-09-18 ENCOUNTER — Inpatient Hospital Stay (HOSPITAL_COMMUNITY)
Admission: RE | Admit: 2019-09-18 | Discharge: 2019-09-20 | DRG: 470 | Disposition: A | Discharge status: Home / Self Care | Payer: PRIVATE HEALTH INSURANCE | Attending: Orthopedic Surgery | Admitting: Orthopedic Surgery

## 2019-09-18 DIAGNOSIS — Z96653 Presence of artificial knee joint, bilateral: Secondary | ICD-10-CM | POA: Diagnosis present

## 2019-09-18 DIAGNOSIS — Z6828 Body mass index (BMI) 28.0-28.9, adult: Secondary | ICD-10-CM

## 2019-09-18 DIAGNOSIS — E663 Overweight: Secondary | ICD-10-CM | POA: Diagnosis present

## 2019-09-18 DIAGNOSIS — Z800 Family history of malignant neoplasm of digestive organs: Secondary | ICD-10-CM

## 2019-09-18 DIAGNOSIS — Z791 Long term (current) use of non-steroidal anti-inflammatories (NSAID): Secondary | ICD-10-CM | POA: Diagnosis not present

## 2019-09-18 DIAGNOSIS — Z87891 Personal history of nicotine dependence: Secondary | ICD-10-CM | POA: Diagnosis not present

## 2019-09-18 DIAGNOSIS — Z79890 Hormone replacement therapy (postmenopausal): Secondary | ICD-10-CM

## 2019-09-18 DIAGNOSIS — Z933 Colostomy status: Secondary | ICD-10-CM

## 2019-09-18 DIAGNOSIS — Z9889 Other specified postprocedural states: Secondary | ICD-10-CM

## 2019-09-18 DIAGNOSIS — Z79899 Other long term (current) drug therapy: Secondary | ICD-10-CM

## 2019-09-18 DIAGNOSIS — Z96649 Presence of unspecified artificial hip joint: Secondary | ICD-10-CM

## 2019-09-18 DIAGNOSIS — Z419 Encounter for procedure for purposes other than remedying health state, unspecified: Secondary | ICD-10-CM

## 2019-09-18 DIAGNOSIS — M1612 Unilateral primary osteoarthritis, left hip: Principal | ICD-10-CM | POA: Diagnosis present

## 2019-09-18 DIAGNOSIS — Z96642 Presence of left artificial hip joint: Secondary | ICD-10-CM

## 2019-09-18 HISTORY — PX: TOTAL HIP ARTHROPLASTY: SHX124

## 2019-09-18 LAB — TYPE AND SCREEN
ABO/RH(D): A NEG
Antibody Screen: NEGATIVE

## 2019-09-18 SURGERY — ARTHROPLASTY, HIP, TOTAL, ANTERIOR APPROACH
Anesthesia: Spinal | Site: Hip | Laterality: Left

## 2019-09-18 MED ORDER — ELETRIPTAN HYDROBROMIDE 40 MG PO TABS
40.0000 mg | ORAL_TABLET | ORAL | Status: DC | PRN
  Filled 2019-09-18: qty 1

## 2019-09-18 MED ORDER — METHOCARBAMOL 500 MG PO TABS
500.0000 mg | ORAL_TABLET | Freq: Four times a day (QID) | ORAL | Status: DC | PRN
  Administered 2019-09-18 – 2019-09-19 (×3): 500 mg via ORAL
  Filled 2019-09-18 (×3): qty 1

## 2019-09-18 MED ORDER — METOCLOPRAMIDE HCL 5 MG PO TABS: 5.0000 mg | ORAL_TABLET | Freq: Three times a day (TID) | ORAL | Status: DC | PRN

## 2019-09-18 MED ORDER — POLYETHYLENE GLYCOL 3350 17 G PO PACK
17.0000 g | PACK | Freq: Two times a day (BID) | ORAL | Status: DC
  Administered 2019-09-18 – 2019-09-20 (×4): 17 g via ORAL
  Filled 2019-09-18 (×4): qty 1

## 2019-09-18 MED ORDER — MEPERIDINE HCL 50 MG/ML IJ SOLN: 6.2500 mg | INTRAMUSCULAR | Status: DC | PRN

## 2019-09-18 MED ORDER — ACETAMINOPHEN 10 MG/ML IV SOLN: 1000.0000 mg | Freq: Once | INTRAVENOUS | Status: DC | PRN

## 2019-09-18 MED ORDER — MAGNESIUM CITRATE PO SOLN: 1.0000 | Freq: Once | ORAL | Status: DC | PRN

## 2019-09-18 MED ORDER — PROPOFOL 10 MG/ML IV BOLUS
INTRAVENOUS | Status: DC | PRN
  Administered 2019-09-18 (×2): 20 mg via INTRAVENOUS

## 2019-09-18 MED ORDER — BUPIVACAINE IN DEXTROSE 0.75-8.25 % IT SOLN
INTRATHECAL | Status: DC | PRN
  Administered 2019-09-18: 1.6 mL via INTRATHECAL

## 2019-09-18 MED ORDER — SODIUM CHLORIDE 0.9 % IV SOLN
INTRAVENOUS | Status: DC
  Administered 2019-09-18: 18:00:00 via INTRAVENOUS

## 2019-09-18 MED ORDER — STERILE WATER FOR IRRIGATION IR SOLN
Status: DC | PRN
  Administered 2019-09-18 (×2): 1000 mL

## 2019-09-18 MED ORDER — METOCLOPRAMIDE HCL 5 MG/ML IJ SOLN: 5.0000 mg | Freq: Three times a day (TID) | INTRAMUSCULAR | Status: DC | PRN

## 2019-09-18 MED ORDER — TRANEXAMIC ACID-NACL 1000-0.7 MG/100ML-% IV SOLN
1000.0000 mg | INTRAVENOUS | Status: AC
  Administered 2019-09-18: 13:00:00 1000 mg via INTRAVENOUS
  Filled 2019-09-18: qty 100

## 2019-09-18 MED ORDER — HYDROMORPHONE HCL 1 MG/ML IJ SOLN
0.5000 mg | INTRAMUSCULAR | Status: DC | PRN
  Administered 2019-09-18: 1 mg via INTRAVENOUS
  Administered 2019-09-18: 0.5 mg via INTRAVENOUS
  Administered 2019-09-18 – 2019-09-19 (×2): 1 mg via INTRAVENOUS
  Filled 2019-09-18 (×4): qty 1

## 2019-09-18 MED ORDER — CEFAZOLIN SODIUM-DEXTROSE 2-4 GM/100ML-% IV SOLN
2.0000 g | Freq: Four times a day (QID) | INTRAVENOUS | Status: AC
  Administered 2019-09-18 – 2019-09-19 (×2): 2 g via INTRAVENOUS
  Filled 2019-09-18 (×2): qty 100

## 2019-09-18 MED ORDER — ONDANSETRON HCL 4 MG PO TABS: 4.0000 mg | ORAL_TABLET | Freq: Four times a day (QID) | ORAL | Status: DC | PRN

## 2019-09-18 MED ORDER — PHENYLEPHRINE HCL-NACL 10-0.9 MG/250ML-% IV SOLN
INTRAVENOUS | Status: DC | PRN
  Administered 2019-09-18: 40 ug/min via INTRAVENOUS

## 2019-09-18 MED ORDER — TRANEXAMIC ACID-NACL 1000-0.7 MG/100ML-% IV SOLN
1000.0000 mg | Freq: Once | INTRAVENOUS | Status: AC
  Administered 2019-09-18: 1000 mg via INTRAVENOUS
  Filled 2019-09-18: qty 100

## 2019-09-18 MED ORDER — HYDROMORPHONE HCL 1 MG/ML IJ SOLN
0.2500 mg | INTRAMUSCULAR | Status: DC | PRN
  Administered 2019-09-18 (×2): 0.5 mg via INTRAVENOUS

## 2019-09-18 MED ORDER — FENTANYL CITRATE (PF) 100 MCG/2ML IJ SOLN
INTRAMUSCULAR | Status: DC | PRN
  Administered 2019-09-18 (×2): 25 ug via INTRAVENOUS

## 2019-09-18 MED ORDER — HYDROCODONE-ACETAMINOPHEN 7.5-325 MG PO TABS: 1.0000 | ORAL_TABLET | Freq: Once | ORAL | Status: DC | PRN

## 2019-09-18 MED ORDER — POVIDONE-IODINE 10 % EX SWAB
2.0000 "application " | Freq: Once | CUTANEOUS | Status: AC
  Administered 2019-09-18: 2 via TOPICAL

## 2019-09-18 MED ORDER — PHENOL 1.4 % MT LIQD: 1.0000 | OROMUCOSAL | Status: DC | PRN

## 2019-09-18 MED ORDER — PROPOFOL 500 MG/50ML IV EMUL
INTRAVENOUS | Status: DC | PRN
  Administered 2019-09-18: 13:00:00 via INTRAVENOUS
  Administered 2019-09-18: 80 ug/kg/min via INTRAVENOUS

## 2019-09-18 MED ORDER — SODIUM CHLORIDE 0.9 % IR SOLN
Status: DC | PRN
  Administered 2019-09-18: 1000 mL

## 2019-09-18 MED ORDER — PHENYLEPHRINE HCL (PRESSORS) 10 MG/ML IV SOLN
INTRAVENOUS | Status: AC
  Filled 2019-09-18: qty 1

## 2019-09-18 MED ORDER — LORATADINE 10 MG PO TABS
10.0000 mg | ORAL_TABLET | Freq: Every day | ORAL | Status: DC
  Administered 2019-09-19 – 2019-09-20 (×2): 10 mg via ORAL
  Filled 2019-09-18 (×2): qty 1

## 2019-09-18 MED ORDER — ONDANSETRON HCL 4 MG/2ML IJ SOLN
4.0000 mg | Freq: Four times a day (QID) | INTRAMUSCULAR | Status: DC | PRN
  Administered 2019-09-18 – 2019-09-19 (×2): 4 mg via INTRAVENOUS
  Filled 2019-09-18 (×2): qty 2

## 2019-09-18 MED ORDER — GUAIFENESIN ER 600 MG PO TB12: 600.0000 mg | ORAL_TABLET | Freq: Two times a day (BID) | ORAL | Status: DC | PRN

## 2019-09-18 MED ORDER — DIPHENHYDRAMINE HCL 12.5 MG/5ML PO ELIX: 12.5000 mg | ORAL_SOLUTION | ORAL | Status: DC | PRN

## 2019-09-18 MED ORDER — FERROUS SULFATE 325 (65 FE) MG PO TABS
325.0000 mg | ORAL_TABLET | Freq: Three times a day (TID) | ORAL | Status: DC
  Administered 2019-09-19 – 2019-09-20 (×5): 325 mg via ORAL
  Filled 2019-09-18 (×5): qty 1

## 2019-09-18 MED ORDER — CHLORHEXIDINE GLUCONATE 4 % EX LIQD
60.0000 mL | Freq: Once | CUTANEOUS | Status: AC
  Administered 2019-09-18: 4 via TOPICAL

## 2019-09-18 MED ORDER — ASPIRIN 81 MG PO CHEW
81.0000 mg | CHEWABLE_TABLET | Freq: Two times a day (BID) | ORAL | Status: DC
  Administered 2019-09-18 – 2019-09-20 (×4): 81 mg via ORAL
  Filled 2019-09-18 (×4): qty 1

## 2019-09-18 MED ORDER — PROMETHAZINE HCL 25 MG/ML IJ SOLN: 6.2500 mg | INTRAMUSCULAR | Status: DC | PRN

## 2019-09-18 MED ORDER — ALUM & MAG HYDROXIDE-SIMETH 200-200-20 MG/5ML PO SUSP: 15.0000 mL | ORAL | Status: DC | PRN

## 2019-09-18 MED ORDER — METHOCARBAMOL 500 MG IVPB - SIMPLE MED
500.0000 mg | Freq: Four times a day (QID) | INTRAVENOUS | Status: DC | PRN
  Administered 2019-09-18: 500 mg via INTRAVENOUS
  Filled 2019-09-18: qty 50

## 2019-09-18 MED ORDER — MONTELUKAST SODIUM 10 MG PO TABS
10.0000 mg | ORAL_TABLET | Freq: Every day | ORAL | Status: DC
  Administered 2019-09-19: 10 mg via ORAL
  Filled 2019-09-18: qty 1

## 2019-09-18 MED ORDER — ALBUMIN HUMAN 5 % IV SOLN
INTRAVENOUS | Status: AC
  Filled 2019-09-18: qty 250

## 2019-09-18 MED ORDER — LACTASE 3000 UNITS PO TABS
3000.0000 [IU] | ORAL_TABLET | Freq: Every day | ORAL | Status: DC | PRN
  Filled 2019-09-18: qty 1

## 2019-09-18 MED ORDER — ONDANSETRON HCL 4 MG/2ML IJ SOLN
INTRAMUSCULAR | Status: DC | PRN
  Administered 2019-09-18: 4 mg via INTRAVENOUS

## 2019-09-18 MED ORDER — ALPRAZOLAM 0.25 MG PO TABS: 0.2500 mg | ORAL_TABLET | Freq: Two times a day (BID) | ORAL | Status: DC | PRN

## 2019-09-18 MED ORDER — SCOPOLAMINE 1 MG/3DAYS TD PT72
1.0000 | MEDICATED_PATCH | TRANSDERMAL | Status: DC
  Administered 2019-09-18: 1.5 mg via TRANSDERMAL
  Filled 2019-09-18: qty 1

## 2019-09-18 MED ORDER — DOCUSATE SODIUM 100 MG PO CAPS
100.0000 mg | ORAL_CAPSULE | Freq: Two times a day (BID) | ORAL | Status: DC
  Administered 2019-09-18 – 2019-09-20 (×4): 100 mg via ORAL
  Filled 2019-09-18 (×4): qty 1

## 2019-09-18 MED ORDER — DEXAMETHASONE SODIUM PHOSPHATE 10 MG/ML IJ SOLN: 10.0000 mg | Freq: Once | INTRAMUSCULAR | Status: DC

## 2019-09-18 MED ORDER — HYDROCODONE-ACETAMINOPHEN 7.5-325 MG PO TABS
1.0000 | ORAL_TABLET | ORAL | Status: DC | PRN
  Administered 2019-09-18 – 2019-09-19 (×2): 2 via ORAL
  Filled 2019-09-18 (×2): qty 2

## 2019-09-18 MED ORDER — CELECOXIB 200 MG PO CAPS
200.0000 mg | ORAL_CAPSULE | Freq: Two times a day (BID) | ORAL | Status: DC
  Administered 2019-09-19 – 2019-09-20 (×3): 200 mg via ORAL
  Filled 2019-09-18 (×5): qty 1

## 2019-09-18 MED ORDER — PROPOFOL 500 MG/50ML IV EMUL
INTRAVENOUS | Status: AC
  Filled 2019-09-18: qty 50

## 2019-09-18 MED ORDER — ACETAMINOPHEN 325 MG PO TABS: 325.0000 mg | ORAL_TABLET | Freq: Four times a day (QID) | ORAL | Status: DC | PRN

## 2019-09-18 MED ORDER — HYDROCODONE-ACETAMINOPHEN 5-325 MG PO TABS
1.0000 | ORAL_TABLET | ORAL | Status: DC | PRN
  Administered 2019-09-18: 2 via ORAL
  Filled 2019-09-18: qty 2

## 2019-09-18 MED ORDER — ALBUMIN HUMAN 5 % IV SOLN
INTRAVENOUS | Status: DC | PRN
  Administered 2019-09-18: 13:00:00 via INTRAVENOUS

## 2019-09-18 MED ORDER — CEFAZOLIN SODIUM-DEXTROSE 2-4 GM/100ML-% IV SOLN
2.0000 g | INTRAVENOUS | Status: AC
  Administered 2019-09-18: 12:00:00 2 g via INTRAVENOUS
  Filled 2019-09-18: qty 100

## 2019-09-18 MED ORDER — DEXAMETHASONE SODIUM PHOSPHATE 10 MG/ML IJ SOLN
10.0000 mg | Freq: Once | INTRAMUSCULAR | Status: AC
  Administered 2019-09-19: 10 mg via INTRAVENOUS
  Filled 2019-09-18: qty 1

## 2019-09-18 MED ORDER — FLUTICASONE PROPIONATE 50 MCG/ACT NA SUSP
1.0000 | Freq: Every day | NASAL | Status: DC
  Administered 2019-09-19 – 2019-09-20 (×2): 1 via NASAL
  Filled 2019-09-18: qty 16

## 2019-09-18 MED ORDER — HYDROMORPHONE HCL 1 MG/ML IJ SOLN
INTRAMUSCULAR | Status: AC
  Filled 2019-09-18: qty 1

## 2019-09-18 MED ORDER — METHOCARBAMOL 500 MG IVPB - SIMPLE MED
INTRAVENOUS | Status: AC
  Filled 2019-09-18: qty 50

## 2019-09-18 MED ORDER — FENTANYL CITRATE (PF) 100 MCG/2ML IJ SOLN
INTRAMUSCULAR | Status: AC
  Filled 2019-09-18: qty 2

## 2019-09-18 MED ORDER — MIDAZOLAM HCL 2 MG/2ML IJ SOLN
INTRAMUSCULAR | Status: AC
  Filled 2019-09-18: qty 2

## 2019-09-18 MED ORDER — BISACODYL 10 MG RE SUPP: 10.0000 mg | Freq: Every day | RECTAL | Status: DC | PRN

## 2019-09-18 MED ORDER — MENTHOL 3 MG MT LOZG: 1.0000 | LOZENGE | OROMUCOSAL | Status: DC | PRN

## 2019-09-18 MED ORDER — LACTATED RINGERS IV SOLN
INTRAVENOUS | Status: DC
  Administered 2019-09-18 (×2): via INTRAVENOUS

## 2019-09-18 MED ORDER — MIDAZOLAM HCL 2 MG/2ML IJ SOLN
INTRAMUSCULAR | Status: DC | PRN
  Administered 2019-09-18: 2 mg via INTRAVENOUS

## 2019-09-18 SURGICAL SUPPLY — 50 items
ADH SKN CLS APL DERMABOND .7 (GAUZE/BANDAGES/DRESSINGS) ×1
BAG DECANTER FOR FLEXI CONT (MISCELLANEOUS) IMPLANT
BAG SPEC THK2 15X12 ZIP CLS (MISCELLANEOUS)
BAG ZIPLOCK 12X15 (MISCELLANEOUS) IMPLANT
BALL HIP CERAMIC (Hips) IMPLANT
BLADE SAG 18X100X1.27 (BLADE) ×3 IMPLANT
BLADE SURG SZ10 CARB STEEL (BLADE) ×6 IMPLANT
COVER PERINEAL POST (MISCELLANEOUS) ×3 IMPLANT
COVER SURGICAL LIGHT HANDLE (MISCELLANEOUS) ×3 IMPLANT
COVER WAND RF STERILE (DRAPES) IMPLANT
CUP ACET PINNACLE SECTR 50MM (Hips) IMPLANT
DERMABOND ADVANCED (GAUZE/BANDAGES/DRESSINGS) ×2
DERMABOND ADVANCED .7 DNX12 (GAUZE/BANDAGES/DRESSINGS) ×1 IMPLANT
DRAPE STERI IOBAN 125X83 (DRAPES) ×3 IMPLANT
DRAPE U-SHAPE 47X51 STRL (DRAPES) ×6 IMPLANT
DRESSING AQUACEL AG SP 3.5X10 (GAUZE/BANDAGES/DRESSINGS) ×1 IMPLANT
DRSG AQUACEL AG SP 3.5X10 (GAUZE/BANDAGES/DRESSINGS) ×3
DURAPREP 26ML APPLICATOR (WOUND CARE) ×3 IMPLANT
ELECT BLADE TIP CTD 4 INCH (ELECTRODE) ×3 IMPLANT
ELECT REM PT RETURN 15FT ADLT (MISCELLANEOUS) ×3 IMPLANT
ELIMINATOR HOLE APEX DEPUY (Hips) ×2 IMPLANT
GLOVE BIO SURGEON STRL SZ 6 (GLOVE) ×6 IMPLANT
GLOVE BIOGEL PI IND STRL 6.5 (GLOVE) ×1 IMPLANT
GLOVE BIOGEL PI IND STRL 7.5 (GLOVE) ×1 IMPLANT
GLOVE BIOGEL PI IND STRL 8.5 (GLOVE) ×1 IMPLANT
GLOVE BIOGEL PI INDICATOR 6.5 (GLOVE) ×2
GLOVE BIOGEL PI INDICATOR 7.5 (GLOVE) ×2
GLOVE BIOGEL PI INDICATOR 8.5 (GLOVE) ×2
GLOVE ECLIPSE 8.0 STRL XLNG CF (GLOVE) ×6 IMPLANT
GLOVE ORTHO TXT STRL SZ7.5 (GLOVE) ×6 IMPLANT
GOWN STRL REUS W/TWL LRG LVL3 (GOWN DISPOSABLE) ×6 IMPLANT
GOWN STRL REUS W/TWL XL LVL3 (GOWN DISPOSABLE) ×3 IMPLANT
HIP BALL CERAMIC (Hips) ×3 IMPLANT
HOLDER FOLEY CATH W/STRAP (MISCELLANEOUS) ×3 IMPLANT
KIT TURNOVER KIT A (KITS) IMPLANT
LINER ACET PNNCL PLUS4 NEUTRAL (Hips) IMPLANT
PACK ANTERIOR HIP CUSTOM (KITS) ×3 IMPLANT
PINNACLE PLUS 4 NEUTRAL (Hips) ×3 IMPLANT
PINNACLE SECTOR CUP 50MM (Hips) ×3 IMPLANT
SCREW 6.5MMX25MM (Screw) ×2 IMPLANT
STEM FEM ACTIS HIGH SZ3 (Stem) ×2 IMPLANT
SUT MNCRL AB 4-0 PS2 18 (SUTURE) ×3 IMPLANT
SUT STRATAFIX 0 PDS 27 VIOLET (SUTURE) ×3
SUT VIC AB 1 CT1 36 (SUTURE) ×9 IMPLANT
SUT VIC AB 2-0 CT1 27 (SUTURE) ×6
SUT VIC AB 2-0 CT1 TAPERPNT 27 (SUTURE) ×2 IMPLANT
SUTURE STRATFX 0 PDS 27 VIOLET (SUTURE) ×1 IMPLANT
TRAY FOLEY MTR SLVR 16FR STAT (SET/KITS/TRAYS/PACK) IMPLANT
WATER STERILE IRR 1000ML POUR (IV SOLUTION) ×3 IMPLANT
YANKAUER SUCT BULB TIP 10FT TU (MISCELLANEOUS) IMPLANT

## 2019-09-18 NOTE — Interval H&P Note (Signed)
History and Physical Interval Note:  09/18/2019 10:54 AM  Molly Beck  has presented today for surgery, with the diagnosis of Left hip osteoarthritis.  The various methods of treatment have been discussed with the patient and family. After consideration of risks, benefits and other options for treatment, the patient has consented to  Procedure(s) with comments: TOTAL HIP ARTHROPLASTY ANTERIOR APPROACH (Left) - 70 mins as a surgical intervention.  The patient's history has been reviewed, patient examined, no change in status, stable for surgery.  I have reviewed the patient's chart and labs.  Questions were answered to the patient's satisfaction.     Mauri Pole

## 2019-09-18 NOTE — Evaluation (Signed)
Physical Therapy Evaluation Patient Details Name: Molly Beck MRN: [id number] DOB: 1966-04-28 Today's Date: 09/18/2019   History of Present Illness  Pt is 53 yo female s/p direct anterior LTHA on 09/18/19.  She has PMH including colon resection with colostomy, Bil TKA, OA, anxiety, and R foot reconstructive surgery (see H and P for full history).  Clinical Impression  Pt is s/p L anterior THA resulting in the deficits listed below (see PT Problem List). Pt's evaluation was limited by severe pain that was not affected by position change or mobility.  She required min A for transfers and only ambulated 4' due to pain.  Pt requiring increased time for transfers and cues for safety/technique.  Pt will benefit from skilled PT to increase their independence and safety with mobility to allow discharge to the venue listed below.      Follow Up Recommendations Follow surgeon's recommendation for DC plan and follow-up therapies    Equipment Recommendations  Rolling walker with 5" wheels;3in1 (PT)    Recommendations for Other Services       Precautions / Restrictions Precautions Precautions: Fall Restrictions Weight Bearing Restrictions: Yes LLE Weight Bearing: Weight bearing as tolerated      Mobility  Bed Mobility Overal bed mobility: Needs Assistance Bed Mobility: Supine to Sit     Supine to sit: Min assist     General bed mobility comments: increased time due to pain; assisted with L LE;  cues for technique  Transfers Overall transfer level: Needs assistance Equipment used: Rolling walker (2 wheeled) Transfers: Sit to/from Stand Sit to Stand: Min assist;From elevated surface         General transfer comment: cues for safe hand placement  Ambulation/Gait Ambulation/Gait assistance: Min assist Gait Distance (Feet): 3 Feet Assistive device: Rolling walker (2 wheeled) Gait Pattern/deviations: Decreased stance time - left;Decreased stride  length;Shuffle;Antalgic;Trunk flexed;Decreased weight shift to left Gait velocity: decreased   General Gait Details: pt with pain limiting; only took a few steps to chair with cues for sequence and RW  Stairs            Wheelchair Mobility    Modified Rankin (Stroke Patients Only)       Balance Overall balance assessment: Needs assistance Sitting-balance support: Bilateral upper extremity supported;Feet supported Sitting balance-Leahy Scale: Fair     Standing balance support: Bilateral upper extremity supported;During functional activity Standing balance-Leahy Scale: Poor                               Pertinent Vitals/Pain Pain Assessment: 0-10 Pain Score: 10-Worst pain ever Pain Location: L hip/leg Pain Descriptors / Indicators: Aching;Constant;Crying;Grimacing;Operative site guarding;Moaning;Guarding;Restless;Sharp Pain Intervention(s): Limited activity within patient's tolerance;Premedicated before session;Repositioned;Ice applied;Patient requesting pain meds-RN notified    Home Living Family/patient expects to be discharged to:: Private residence Living Arrangements: Children Available Help at Discharge: Family Type of Home: Apartment Home Access: Level entry     Home Layout: One level Home Equipment: Crutches      Prior Function Level of Independence: Independent with assistive device(s)               Hand Dominance        Extremity/Trunk Assessment   Upper Extremity Assessment Upper Extremity Assessment: Overall WFL for tasks assessed    Lower Extremity Assessment Lower Extremity Assessment: LLE deficits/detail;RLE deficits/detail RLE Deficits / Details: Appears grossly WFL ; however, limited testing due to severe L LE pain.  Pt  with R foot surgery recently and reports wearing a brace at times for support. LLE Deficits / Details: L ankle WFL; L hip and knee strength 1/5; L knee ROM WFL; L hip ROM : Limited by pain but WFL LLE:  Unable to fully assess due to pain       Communication   Communication: No difficulties  Cognition Arousal/Alertness: Awake/alert Behavior During Therapy: WFL for tasks assessed/performed Overall Cognitive Status: Within Functional Limits for tasks assessed                                 General Comments: Pt with severe pain but she was willing to attempt therapy to see if repositioning improved pain.      General Comments General comments (skin integrity, edema, etc.): vss    Exercises     Assessment/Plan    PT Assessment Patient needs continued PT services  PT Problem List Decreased strength;Decreased mobility;Decreased range of motion;Decreased activity tolerance;Decreased balance;Decreased knowledge of use of DME;Pain       PT Treatment Interventions DME instruction;Therapeutic exercise;Gait training;Balance training;Stair training;Neuromuscular re-education;Modalities;Functional mobility training;Therapeutic activities;Patient/family education    PT Goals (Current goals can be found in the Care Plan section)  Acute Rehab PT Goals Patient Stated Goal: return home; decrease pain PT Goal Formulation: With patient/family Time For Goal Achievement: 10/02/19 Potential to Achieve Goals: Good    Frequency 7X/week   Barriers to discharge   pain    Co-evaluation               AM-PAC PT "6 Clicks" Mobility  Outcome Measure Help needed turning from your back to your side while in a flat bed without using bedrails?: A Little Help needed moving from lying on your back to sitting on the side of a flat bed without using bedrails?: A Little Help needed moving to and from a bed to a chair (including a wheelchair)?: A Little Help needed standing up from a chair using your arms (e.g., wheelchair or bedside chair)?: A Little Help needed to walk in hospital room?: A Little Help needed climbing 3-5 steps with a railing? : A Lot 6 Click Score: 17    End of  Session Equipment Utilized During Treatment: Gait belt Activity Tolerance: Patient limited by pain Patient left: in chair;with family/visitor present;with call bell/phone within reach Nurse Communication: Mobility status PT Visit Diagnosis: Unsteadiness on feet (R26.81);Other abnormalities of gait and mobility (R26.89);Muscle weakness (generalized) (M62.81);Pain Pain - Right/Left: Left Pain - part of body: Hip    Time: 1540-0867 PT Time Calculation (min) (ACUTE ONLY): 22 min   Charges:   PT Evaluation $PT Eval Moderate Complexity: 1 Mod          Maggie Font, PT Acute Rehab Services Pager [phone] [facility] [phone] [facility] [phone]   Karlton Lemon 09/18/2019, 5:40 PM

## 2019-09-18 NOTE — Transfer of Care (Signed)
Immediate Anesthesia Transfer of Care Note  Patient: Molly Beck  Procedure(s) Performed: TOTAL HIP ARTHROPLASTY ANTERIOR APPROACH (Left Hip)  Patient Location: PACU  Anesthesia Type:Spinal  Level of Consciousness: awake, alert , oriented, drowsy and patient cooperative  Airway & Oxygen Therapy: Patient Spontanous Breathing and Patient connected to face mask oxygen  Post-op Assessment: Report given to RN and Post -op Vital signs reviewed and stable  Post vital signs: Reviewed and stable  Last Vitals:  Vitals Value Taken Time  BP 94/68 09/18/19 1346  Temp 36.3 C 09/18/19 1345  Pulse 83 09/18/19 1348  Resp 19 09/18/19 1348  SpO2 100 % 09/18/19 1348  Vitals shown include unvalidated device data.  Last Pain:  Vitals:   09/18/19 1052  TempSrc:   PainSc: 0-No pain         Complications: No apparent anesthesia complications

## 2019-09-18 NOTE — Anesthesia Procedure Notes (Signed)
Spinal  Patient location during procedure: OR Start time: 09/18/2019 12:03 PM End time: 09/18/2019 12:07 PM Staffing Anesthesiologist: Barnet Glasgow, MD Resident/CRNA: Raenette Rover, CRNA Performed: resident/CRNA  Preanesthetic Checklist Completed: patient identified, site marked, surgical consent, pre-op evaluation, timeout performed, IV checked, risks and benefits discussed and monitors and equipment checked Spinal Block Patient position: sitting Prep: DuraPrep Patient monitoring: blood pressure, continuous pulse ox and heart rate Approach: midline Location: L3-4 Injection technique: single-shot Needle Needle type: Pencan  Needle gauge: 24 G Assessment Sensory level: T6

## 2019-09-18 NOTE — Op Note (Signed)
NAME:  Molly Beck                ACCOUNT NO.: [id number]      MEDICAL RECORD NO.: [id number]      [facility]      PHYSICIAN:  Shelda PalMatthew D Annaliza Zia  DATE OF BIRTH:  1966-04-12     DATE OF PROCEDURE:  09/18/2019                                 OPERATIVE REPORT         PREOPERATIVE DIAGNOSIS: Left  hip osteoarthritis.      POSTOPERATIVE DIAGNOSIS:  Left hip osteoarthritis.      PROCEDURE:  Left total hip replacement through an anterior approach   utilizing DePuy THR system, component size 50mm pinnacle cup, a size 32+4 neutral   Altrex liner, a size 3 HI Actis stem with a 32+5 delta ceramic   ball.      SURGEON:  Madlyn FrankelMatthew D. Charlann Boxerlin, M.D.      ASSISTANT:  Dennie BibleAshley Stinson, [name]     ANESTHESIA:  Spinal.      SPECIMENS:  None.      COMPLICATIONS:  None.      BLOOD LOSS:  400 cc     DRAINS:  None.      INDICATION OF THE PROCEDURE:  Molly Beck is a 53 y.o. female who had   presented to office for evaluation of left hip pain.  Radiographs revealed   progressive degenerative changes with bone-on-bone   articulation of the  hip joint, including subchondral cystic changes and osteophytes.  The patient had painful limited range of   motion significantly affecting their overall quality of life and function.  The patient was failing to    respond to conservative measures including medications and/or injections and activity modification and at this point was ready   to proceed with more definitive measures.  Consent was obtained for   benefit of pain relief.  Specific risks of infection, DVT, component   failure, dislocation, neurovascular injury, and need for revision surgery were reviewed in the office as well discussion of   the anterior versus posterior approach were reviewed.     PROCEDURE IN DETAIL:  The patient was brought to operative theater.   Once adequate anesthesia, preoperative antibiotics, 2 gm of Ancef, 1 gm of Tranexamic Acid, and  10 mg of Decadron were administered, the patient was positioned supine on the [employer]anna table.  Once the patient was safely positioned with adequate padding of boney prominences we predraped out the hip, and used fluoroscopy to confirm orientation of the pelvis.      The left hip was then prepped and draped from proximal iliac crest to   mid thigh with a shower curtain technique.      Time-out was performed identifying the patient, planned procedure, and the appropriate extremity.     An incision was then made 2 cm lateral to the   anterior superior iliac spine extending over the orientation of the   tensor fascia lata muscle and sharp dissection was carried down to the   fascia of the muscle.      The fascia was then incised.  The muscle belly was identified and swept   laterally and retractor placed along the superior neck.  Following   cauterization of the circumflex vessels and removing some pericapsular  fat, a second cobra retractor was placed on the inferior neck.  A T-capsulotomy was made along the line of the   superior neck to the trochanteric fossa, then extended proximally and   distally.  Tag sutures were placed and the retractors were then placed   intracapsular.  We then identified the trochanteric fossa and   orientation of my neck cut and then made a neck osteotomy with the femur on traction.  The femoral   head was removed without difficulty or complication.  Traction was let   off and retractors were placed posterior and anterior around the   acetabulum.      The labrum and foveal tissue were debrided.  I began reaming with a 45 mm   reamer and reamed up to 49 mm reamer with good bony bed preparation and a 50 mm  cup was chosen.  The final 50 mm Pinnacle cup was then impacted under fluoroscopy to confirm the depth of penetration and orientation with respect to   Abduction and forward flexion.  A screw was placed into the ilium followed by the hole eliminator.  The final    32+4 neutral Altrex liner was impacted with good visualized rim fit.  The cup was positioned anatomically within the acetabular portion of the pelvis.      At this point, the femur was rolled to 100 degrees.  Further capsule was   released off the inferior aspect of the femoral neck.  I then   released the superior capsule proximally.  With the leg in a neutral position the hook was placed laterally   along the femur under the vastus lateralis origin and elevated manually and then held in position using the hook attachment on the bed.  The leg was then extended and adducted with the leg rolled to 100   degrees of external rotation.  Retractors were placed along the medial calcar and posteriorly over the greater trochanter.  Once the proximal femur was fully   exposed, I used a box osteotome to set orientation.  I then began   broaching with the starting chili pepper broach and passed this by hand and then broached up to 3.  With the 3 broach in place I chose a high offset neck and did several trial reductions.  The offset was appropriate, leg lengths   appeared to be equal best matched with the +5 head ball trial confirmed radiographically.   Given these findings, I went ahead and dislocated the hip, repositioned all   retractors and positioned the right hip in the extended and abducted position.  The final 3 Hi Actis stem was   chosen and it was impacted down to the level of neck cut.  Based on this   and the trial reductions, a final 32+5 delta ceramic ball was chosen and   impacted onto a clean and dry trunnion, and the hip was reduced.  The   hip had been irrigated throughout the case again at this point.  I did   reapproximate the superior capsular leaflet to the anterior leaflet   using #1 Vicryl.  The fascia of the   tensor fascia lata muscle was then reapproximated using #1 Vicryl and #0 Stratafix sutures.  The   remaining wound was closed with 2-0 Vicryl and running 4-0 Monocryl.    The hip was cleaned, dried, and dressed sterilely using Dermabond and   Aquacel dressing.  The patient was then brought   to recovery room in  stable condition tolerating the procedure well.    Griffith Citron, [name] was present for the entirety of the case involved from   preoperative positioning, perioperative retractor management, general   facilitation of the case, as well as primary wound closure as assistant.            Pietro Cassis Alvan Dame, M.D.        09/18/2019 1:25 PM

## 2019-09-18 NOTE — Anesthesia Postprocedure Evaluation (Signed)
Anesthesia Post Note  Patient: Molly Beck  Procedure(s) Performed: TOTAL HIP ARTHROPLASTY ANTERIOR APPROACH (Left Hip)     Patient location during evaluation: Nursing Unit Anesthesia Type: Spinal Level of consciousness: oriented and awake and alert Pain management: pain level controlled Vital Signs Assessment: post-procedure vital signs reviewed and stable Respiratory status: spontaneous breathing and respiratory function stable Cardiovascular status: blood pressure returned to baseline and stable Postop Assessment: no headache, no backache, no apparent nausea or vomiting and patient able to bend at knees Anesthetic complications: no    Last Vitals:  Vitals:   09/18/19 1039 09/18/19 1346  BP: 118/84 94/68  Pulse: 85 87  Resp: 16 18  Temp: 36.7 C (!) 36.3 C  SpO2: 97% (P) 100%    Last Pain:  Vitals:   09/18/19 1346  TempSrc:   PainSc: (P) 0-No pain    LLE Motor Response: (P) Purposeful movement (09/18/19 1346)   RLE Motor Response: (P) Purposeful movement (09/18/19 1346)   L Sensory Level: (P) L4-Anterior knee, lower leg (09/18/19 1346) R Sensory Level: (P) L4-Anterior knee, lower leg (09/18/19 1346)  Barnet Glasgow

## 2019-09-18 NOTE — Discharge Instructions (Signed)

## 2019-09-19 ENCOUNTER — Encounter (HOSPITAL_COMMUNITY): Payer: Self-pay | Admitting: Orthopedic Surgery

## 2019-09-19 DIAGNOSIS — E663 Overweight: Secondary | ICD-10-CM | POA: Diagnosis present

## 2019-09-19 LAB — CBC
HCT: 35.2 % — ABNORMAL LOW (ref 36.0–46.0)
Hemoglobin: 11.3 g/dL — ABNORMAL LOW (ref 12.0–15.0)
MCH: 30.4 pg (ref 26.0–34.0)
MCHC: 32.1 g/dL (ref 30.0–36.0)
MCV: 94.6 fL (ref 80.0–100.0)
Platelets: 229 10*3/uL (ref 150–400)
RBC: 3.72 MIL/uL — ABNORMAL LOW (ref 3.87–5.11)
RDW: 13.8 % (ref 11.5–15.5)
WBC: 7.2 10*3/uL (ref 4.0–10.5)
nRBC: 0 % (ref 0.0–0.2)

## 2019-09-19 LAB — BASIC METABOLIC PANEL
Anion gap: 8 (ref 5–15)
BUN: 9 mg/dL (ref 6–20)
CO2: 26 mmol/L (ref 22–32)
Calcium: 8.4 mg/dL — ABNORMAL LOW (ref 8.9–10.3)
Chloride: 103 mmol/L (ref 98–111)
Creatinine, Ser: 0.55 mg/dL (ref 0.44–1.00)
GFR calc Af Amer: >60 mL/min (ref >60)
GFR calc non Af Amer: >60 mL/min (ref >60)
Glucose, Bld: 129 mg/dL — ABNORMAL HIGH (ref 70–99)
Potassium: 3.5 mmol/L (ref 3.5–5.1)
Sodium: 137 mmol/L (ref 135–145)

## 2019-09-19 MED ORDER — ASPIRIN 81 MG PO CHEW: 81.0000 mg | CHEWABLE_TABLET | Freq: Two times a day (BID) | ORAL | 0 refills | Status: DC

## 2019-09-19 MED ORDER — OXYCODONE HCL 5 MG PO TABS
5.0000 mg | ORAL_TABLET | ORAL | Status: DC | PRN
  Administered 2019-09-19: 5 mg via ORAL
  Administered 2019-09-19: 10 mg via ORAL
  Filled 2019-09-19: qty 2
  Filled 2019-09-19: qty 1

## 2019-09-19 MED ORDER — HYDROCODONE-ACETAMINOPHEN 7.5-325 MG PO TABS: 1.0000 | ORAL_TABLET | ORAL | 0 refills | Status: DC | PRN

## 2019-09-19 MED ORDER — METHOCARBAMOL 500 MG PO TABS: 500.0000 mg | ORAL_TABLET | Freq: Four times a day (QID) | ORAL | 0 refills | Status: DC | PRN

## 2019-09-19 MED ORDER — OXYCODONE HCL 5 MG PO TABS: 5.0000 mg | ORAL_TABLET | ORAL | 0 refills | Status: DC | PRN

## 2019-09-19 MED ORDER — FERROUS SULFATE 325 (65 FE) MG PO TABS: 325.0000 mg | ORAL_TABLET | Freq: Three times a day (TID) | ORAL | 0 refills | Status: DC

## 2019-09-19 MED ORDER — ACETAMINOPHEN 325 MG PO TABS
650.0000 mg | ORAL_TABLET | Freq: Four times a day (QID) | ORAL | Status: DC
  Administered 2019-09-19 – 2019-09-20 (×4): 650 mg via ORAL
  Filled 2019-09-19 (×5): qty 2

## 2019-09-19 MED ORDER — OXYCODONE HCL 5 MG PO TABS
5.0000 mg | ORAL_TABLET | ORAL | Status: DC | PRN
  Administered 2019-09-20 (×2): 10 mg via ORAL
  Filled 2019-09-19 (×2): qty 2

## 2019-09-19 MED ORDER — ACETAMINOPHEN 500 MG PO TABS: 1000.0000 mg | ORAL_TABLET | Freq: Three times a day (TID) | ORAL | 0 refills | Status: AC

## 2019-09-19 MED ORDER — DOCUSATE SODIUM 100 MG PO CAPS: 100.0000 mg | ORAL_CAPSULE | Freq: Two times a day (BID) | ORAL | 0 refills | Status: DC

## 2019-09-19 MED ORDER — HYDROMORPHONE HCL 1 MG/ML IJ SOLN
1.5000 mg | INTRAMUSCULAR | Status: DC | PRN
  Administered 2019-09-19: 1.5 mg via INTRAVENOUS
  Filled 2019-09-19: qty 1.5

## 2019-09-19 MED ORDER — POLYETHYLENE GLYCOL 3350 17 G PO PACK: 17.0000 g | PACK | Freq: Two times a day (BID) | ORAL | 0 refills | Status: DC

## 2019-09-19 MED ORDER — OXYCODONE-ACETAMINOPHEN 5-325 MG PO TABS
1.0000 | ORAL_TABLET | ORAL | Status: DC | PRN
  Administered 2019-09-19: 1 via ORAL
  Administered 2019-09-19 (×2): 2 via ORAL
  Filled 2019-09-19 (×3): qty 2

## 2019-09-19 NOTE — Progress Notes (Signed)
Therapy Plan: HEP Patient has DME.  

## 2019-09-19 NOTE — Progress Notes (Signed)
     Subjective: 1 Day Post-Op Procedure(s) (LRB): TOTAL HIP ARTHROPLASTY ANTERIOR APPROACH (Left)   Patient reports pain as mild, pain controlled.  States she had a rough afternoon/night, but much better now.  No otherwise reported events throughout the night.  We discussed the procedure, findings and expectations moving forward.  Patient feels that she is ready be discharged home, she does well therapy.  Patient knows to call with any questions or concerns.  Patient follow-up in the clinic in 2 weeks.    Objective:   VITALS:   Vitals:   09/19/19 0116 09/19/19 0434  BP: 114/72 117/76  Pulse: 72 74  Resp: 18 14  Temp: 98.6 F (37 C) 98.6 F (37 C)  SpO2: 97% 98%    Dorsiflexion/Plantar flexion intact Incision: dressing C/D/I No cellulitis present Compartment soft  LABS Recent Labs    09/19/19 0159  HGB 11.3*  HCT 35.2*  WBC 7.2  PLT 229    Recent Labs    09/19/19 0159  NA 137  K 3.5  BUN 9  CREATININE 0.55  GLUCOSE 129*     Assessment/Plan: 1 Day Post-Op Procedure(s) (LRB): TOTAL HIP ARTHROPLASTY ANTERIOR APPROACH (Left) Foley cath d/c'ed Advance diet Up with therapy D/C IV fluids Discharge home  Follow up in 2 weeks at [facility] Follow up with OLIN,Araeya Lamb D in 2 weeks.  Contact information:  [facility] [address] [place] [address] [phone]       Overweight (BMI 25-29.9) Estimated body mass index is 28.76 kg/m as calculated from the following:   Height as of this encounter: 5\' 4"  (1.626 m).   Weight as of this encounter: 76 kg. Patient also counseled that weight may inhibit the healing process Patient counseled that losing weight will help with future health issues       West Pugh. Rainey Rodger   PAC  09/19/2019, 8:52 AM

## 2019-09-19 NOTE — Progress Notes (Signed)
Patient has c/o severe pain that is not subsided by administered pain meds. Patient is asking for increased dose of IV meds for breakthrough pain. I have been alternating PO medications and IV medications and nothing is really giving patient any relief. Contacted MD on call and received verbal order for increased dose of Dilaudid, Percocet 5mg  Q4, discontinue NORCO, and 650mg  of scheduled Tylenol PO Q6. See MAR for more information. Dawson Bills, RN

## 2019-09-19 NOTE — Progress Notes (Signed)
Pt exceeded the limit of acetaminophen, administered 1 percocet. PA is paged and new orders received. Administered 5mg  of oxycodone. Monitoring closely.

## 2019-09-19 NOTE — Progress Notes (Signed)
Physical Therapy Treatment Patient Details Name: Molly Beck MRN: [id number] DOB: May 04, 1966 Today's Date: 09/19/2019    History of Present Illness Pt is 53 yo female s/p direct anterior LTHA on 09/18/19.  She has PMH including colon resection with colostomy, Bil TKA, OA, anxiety, and R foot reconstructive surgery (see H and P for full history).    PT Comments    Pt is progressing gradually.  Demonstrated improved gait distance and pain control.  Exercises modified to accommodate for decreased strength and pain control.  Pt ambulated 50' but with shuffle pattern and decreased weight on L LE.  Will continue to benefit from PT services.    Follow Up Recommendations  Follow surgeon's recommendation for DC plan and follow-up therapies     Equipment Recommendations  Rolling walker with 5" wheels;3in1 (PT)    Recommendations for Other Services       Precautions / Restrictions Precautions Precautions: Fall Restrictions Weight Bearing Restrictions: No LLE Weight Bearing: Weight bearing as tolerated    Mobility  Bed Mobility Overal bed mobility: Needs Assistance Bed Mobility: Sit to Supine       Sit to supine: Min assist   General bed mobility comments: increased time due to pain, assist with L LE - demonstrated how to use gait belt as leg lifter  Transfers Overall transfer level: Needs assistance Equipment used: Rolling walker (2 wheeled) Transfers: Sit to/from Stand Sit to Stand: Min guard         General transfer comment: cues for safe hand placement  Ambulation/Gait Ambulation/Gait assistance: Min guard Gait Distance (Feet): 50 Feet Assistive device: Rolling walker (2 wheeled) Gait Pattern/deviations: Decreased stance time - left;Decreased stride length;Shuffle;Antalgic;Decreased weight shift to left Gait velocity: decreased   General Gait Details: pt limited weight bearing and stance on L due to pain; cued for sequence   Stairs              Wheelchair Mobility    Modified Rankin (Stroke Patients Only)       Balance Overall balance assessment: Needs assistance Sitting-balance support: Bilateral upper extremity supported;Feet supported Sitting balance-Leahy Scale: Good     Standing balance support: Bilateral upper extremity supported;During functional activity Standing balance-Leahy Scale: Fair                              Cognition Arousal/Alertness: Awake/alert Behavior During Therapy: WFL for tasks assessed/performed Overall Cognitive Status: Within Functional Limits for tasks assessed                                 General Comments: Reports pain much better this morning      Exercises Total Joint Exercises Ankle Circles/Pumps: AROM;Both;10 reps;Supine Quad Sets: AROM;Both;10 reps;Supine Heel Slides: AAROM;Left;5 reps(gait belt and PT assisted -limited by pain) Hip ABduction/ADduction: AAROM;Left;5 reps(gait belt and PT assisted -limited by pain) Long Arc Quad: AAROM;Left;10 reps    General Comments        Pertinent Vitals/Pain Pain Assessment: 0-10 Pain Score: 4  Pain Location: L hip/leg Pain Descriptors / Indicators: Burning Pain Intervention(s): Limited activity within patient's tolerance;Repositioned;Ice applied;Premedicated before session    Home Living                      Prior Function            PT Goals (current goals can now be found  in the care plan section) Progress towards PT goals: Progressing toward goals    Frequency    7X/week      PT Plan Current plan remains appropriate    Co-evaluation              AM-PAC PT "6 Clicks" Mobility   Outcome Measure  Help needed turning from your back to your side while in a flat bed without using bedrails?: A Little Help needed moving from lying on your back to sitting on the side of a flat bed without using bedrails?: A Little Help needed moving to and from a bed to a chair  (including a wheelchair)?: None Help needed standing up from a chair using your arms (e.g., wheelchair or bedside chair)?: None Help needed to walk in hospital room?: None Help needed climbing 3-5 steps with a railing? : A Lot 6 Click Score: 20    End of Session   Activity Tolerance: Patient limited by pain Patient left: with family/visitor present;with call bell/phone within reach;in bed;with bed alarm set Nurse Communication: Mobility status PT Visit Diagnosis: Unsteadiness on feet (R26.81);Other abnormalities of gait and mobility (R26.89);Muscle weakness (generalized) (M62.81);Pain     Time: 6433-2951 PT Time Calculation (min) (ACUTE ONLY): 28 min  Charges:  $Gait Training: 8-22 mins $Therapeutic Exercise: 8-22 mins                     Molly Beck, PT Acute Rehab Services Pager [phone] [facility] [phone]    Molly Beck 09/19/2019, 11:20 AM

## 2019-09-19 NOTE — Progress Notes (Addendum)
Physical Therapy Treatment Patient Details Name: Molly Beck MRN: [id number] DOB: July 21, 1966 Today's Date: 09/19/2019    History of Present Illness Pt is 53 yo female s/p direct anterior LTHA on 09/18/19.  She has PMH including colon resection with colostomy, Bil TKA, OA, anxiety, and R foot reconstructive surgery (see H and P for full history).    PT Comments    Pt demonstrates safe mobility but continues to be limited by pain. She was able to ambulate 63' but with decreased gait speed and limited weight on L LE.  Requires min A for transfers.  Does have 24 hr support at home.  Will continue to benefit from acute PT services.     Follow Up Recommendations  Follow surgeon's recommendation for DC plan and follow-up therapies     Equipment Recommendations  Rolling walker with 5" wheels;3in1 (PT)    Recommendations for Other Services       Precautions / Restrictions Precautions Precautions: Fall Restrictions LLE Weight Bearing: Weight bearing as tolerated    Mobility  Bed Mobility Overal bed mobility: Needs Assistance Bed Mobility: Sit to Supine     Supine to sit: Min assist Sit to supine: Min assist   General bed mobility comments: increased time due to pain, assist with L LE - demonstrated how to use gait belt as leg lifter  Transfers Overall transfer level: Needs assistance Equipment used: Rolling walker (2 wheeled) Transfers: Sit to/from Stand Sit to Stand: Supervision         General transfer comment: cues for safe hand placement  Ambulation/Gait Ambulation/Gait assistance: Min guard Gait Distance (Feet): 50 Feet Assistive device: Rolling walker (2 wheeled) Gait Pattern/deviations: Decreased stance time - left;Decreased stride length;Antalgic;Decreased weight shift to left Gait velocity: decreased   General Gait Details: pt limited weight bearing and stance on L due to pain; cued for sequence; increased foot clearence with cues   Stairs             Wheelchair Mobility    Modified Rankin (Stroke Patients Only)       Balance Overall balance assessment: Needs assistance Sitting-balance support: Bilateral upper extremity supported;Feet supported Sitting balance-Leahy Scale: Good     Standing balance support: Bilateral upper extremity supported;During functional activity Standing balance-Leahy Scale: Fair                              Cognition Arousal/Alertness: Awake/alert Behavior During Therapy: WFL for tasks assessed/performed Overall Cognitive Status: Within Functional Limits for tasks assessed                                      Exercises   General Comments General comments (skin integrity, edema, etc.): held exercises due to pain      Pertinent Vitals/Pain Pain Assessment: 0-10 Pain Score: 4 (4/10 rest; 8/10 walk) Pain Location: L hip/leg Pain Descriptors / Indicators: Burning Pain Intervention(s): Limited activity within patient's tolerance;Ice applied;Repositioned;Premedicated before session    Home Living                      Prior Function            PT Goals (current goals can now be found in the care plan section) Progress towards PT goals: Progressing toward goals    Frequency    7X/week      PT  Plan Current plan remains appropriate    Co-evaluation              AM-PAC PT "6 Clicks" Mobility   Outcome Measure  Help needed turning from your back to your side while in a flat bed without using bedrails?: A Little Help needed moving from lying on your back to sitting on the side of a flat bed without using bedrails?: A Little Help needed moving to and from a bed to a chair (including a wheelchair)?: None Help needed standing up from a chair using your arms (e.g., wheelchair or bedside chair)?: None Help needed to walk in hospital room?: None Help needed climbing 3-5 steps with a railing? : A Lot 6 Click Score: 20    End of Session  Equipment Utilized During Treatment: Gait belt Activity Tolerance: Patient limited by pain Patient left: with call bell/phone within reach;in bed;with bed alarm set;with SCD's reapplied Nurse Communication: Mobility status PT Visit Diagnosis: Unsteadiness on feet (R26.81);Other abnormalities of gait and mobility (R26.89);Muscle weakness (generalized) (M62.81);Pain     Time: 9937-1696 PT Time Calculation (min) (ACUTE ONLY): 19 min  Charges:  $Gait Training: 8-22 mins                     Molly Beck, PT Acute Rehab Services Pager [phone] [facility] [phone] [facility] [phone]    Molly Beck 09/19/2019, 3:10 PM

## 2019-09-19 NOTE — Progress Notes (Signed)
Pt did not perform well in PT, able to ambulate 50 steps. Pt is been ambulating to the BR with minimal assist. PA is paged and recommended to discontinue discharge orders. Pt is notified and agreed with the recommendations. Pain medications also changed by PA. Monitoring closely.

## 2019-09-20 LAB — BASIC METABOLIC PANEL
Anion gap: 7 (ref 5–15)
BUN: 10 mg/dL (ref 6–20)
CO2: 24 mmol/L (ref 22–32)
Calcium: 8.4 mg/dL — ABNORMAL LOW (ref 8.9–10.3)
Chloride: 104 mmol/L (ref 98–111)
Creatinine, Ser: 0.54 mg/dL (ref 0.44–1.00)
GFR calc Af Amer: >60 mL/min (ref >60)
GFR calc non Af Amer: >60 mL/min (ref >60)
Glucose, Bld: 138 mg/dL — ABNORMAL HIGH (ref 70–99)
Potassium: 3.5 mmol/L (ref 3.5–5.1)
Sodium: 135 mmol/L (ref 135–145)

## 2019-09-20 LAB — CBC
HCT: 31.8 % — ABNORMAL LOW (ref 36.0–46.0)
Hemoglobin: 10.3 g/dL — ABNORMAL LOW (ref 12.0–15.0)
MCH: 30.4 pg (ref 26.0–34.0)
MCHC: 32.4 g/dL (ref 30.0–36.0)
MCV: 93.8 fL (ref 80.0–100.0)
Platelets: 224 10*3/uL (ref 150–400)
RBC: 3.39 MIL/uL — ABNORMAL LOW (ref 3.87–5.11)
RDW: 13.9 % (ref 11.5–15.5)
WBC: 9.5 10*3/uL (ref 4.0–10.5)
nRBC: 0 % (ref 0.0–0.2)

## 2019-09-20 NOTE — Progress Notes (Signed)
Patient ID: Molly Beck, female   DOB: Feb 01, 1966, 53 y.o.   MRN: [id number] Subjective: 2 Days Post-Op Procedure(s) (LRB): TOTAL HIP ARTHROPLASTY ANTERIOR APPROACH (Left)    Patient reports pain as mild to moderate but improved from yesterday.  Able to do more this am than yesterday  Objective:   VITALS:   Vitals:   09/19/19 2319 09/20/19 0629  BP: 116/71 114/67  Pulse: 60 66  Resp: 16 16  Temp: 98.3 F (36.8 C) 98.6 F (37 C)  SpO2: 95% 96%    Neurovascular intact Incision: dressing C/D/I  LABS Recent Labs    09/19/19 0159 09/20/19 0320  HGB 11.3* 10.3*  HCT 35.2* 31.8*  WBC 7.2 9.5  PLT 229 224    Recent Labs    09/19/19 0159 09/20/19 0320  NA 137 135  K 3.5 3.5  BUN 9 10  CREATININE 0.55 0.54  GLUCOSE 129* 138*    No results for input(s): LABPT, INR in the last 72 hours.   Assessment/Plan: 2 Days Post-Op Procedure(s) (LRB): TOTAL HIP ARTHROPLASTY ANTERIOR APPROACH (Left)   Up with therapy  Home after therapy likely this am RTC in 2 weeks Post op course and expectations reviewed

## 2019-09-20 NOTE — Progress Notes (Signed)
Physical Therapy Treatment Patient Details Name: Molly Beck MRN: [id number] DOB: January 15, 1966 Today's Date: 09/20/2019    History of Present Illness Pt is 53 yo female s/p direct anterior LTHA on 09/18/19.  She has PMH including colon resection with colostomy, Bil TKA, OA, anxiety, and R foot reconstructive surgery (see H and P for full history).    PT Comments    Pt is POD # 2 and is progressing well. Pt with improved pain control and improved gait quantity and quality.  She was able to take steps today instead of shuffle feet.  Pt demonstrates safe gait & transfers in order to return home from PT perspective once discharged by MD.  While in hospital, will continue to benefit from PT for skilled therapy to advance mobility and exercises.      Follow Up Recommendations  Follow surgeon's recommendation for DC plan and follow-up therapies     Equipment Recommendations  None recommended by PT    Recommendations for Other Services       Precautions / Restrictions Precautions Precautions: Fall Restrictions Weight Bearing Restrictions: No LLE Weight Bearing: Weight bearing as tolerated    Mobility  Bed Mobility               General bed mobility comments: up with nurse tech at arrival  Transfers   Equipment used: Rolling walker (2 wheeled) Transfers: Sit to/from Stand Sit to Stand: Supervision            Ambulation/Gait Ambulation/Gait assistance: Supervision Gait Distance (Feet): 120 Feet Assistive device: Rolling walker (2 wheeled) Gait Pattern/deviations: Decreased stance time - left;Decreased stride length;Antalgic;Decreased weight shift to left     General Gait Details: still with step to pattern but improved step length and foot clearence compared to yesterday; educated on RW height   Stairs Stairs: Yes(Pt with no steps to enter, did discuss and provided written education on how to perform with crutch if going to a family members house with no  rails)           Wheelchair Mobility    Modified Rankin (Stroke Patients Only)       Balance   Sitting-balance support: Bilateral upper extremity supported;Feet supported Sitting balance-Leahy Scale: Good     Standing balance support: Bilateral upper extremity supported;During functional activity Standing balance-Leahy Scale: Good                              Cognition Arousal/Alertness: Awake/alert Behavior During Therapy: WFL for tasks assessed/performed Overall Cognitive Status: Within Functional Limits for tasks assessed                                        Exercises Total Joint Exercises Ankle Circles/Pumps: AROM;Both;10 reps;Supine Quad Sets: AROM;Both;10 reps;Supine Heel Slides: AAROM;Left;10 reps Hip ABduction/ADduction: AAROM;Left;10 reps Long Arc Quad: AROM;Left;10 reps    General Comments        Pertinent Vitals/Pain Pain Score: 5 (2/10 rest; 5/10 activity) Pain Location: L hip/leg Pain Descriptors / Indicators: Burning Pain Intervention(s): Limited activity within patient's tolerance;Repositioned;Premedicated before session;Ice applied    Home Living                      Prior Function            PT Goals (current goals can now be found in  the care plan section) Progress towards PT goals: Progressing toward goals    Frequency    7X/week      PT Plan Current plan remains appropriate    Co-evaluation              AM-PAC PT "6 Clicks" Mobility   Outcome Measure  Help needed turning from your back to your side while in a flat bed without using bedrails?: A Little Help needed moving from lying on your back to sitting on the side of a flat bed without using bedrails?: A Little Help needed moving to and from a bed to a chair (including a wheelchair)?: None Help needed standing up from a chair using your arms (e.g., wheelchair or bedside chair)?: None Help needed to walk in hospital room?:  None Help needed climbing 3-5 steps with a railing? : A Little 6 Click Score: 21    End of Session Equipment Utilized During Treatment: Gait belt Activity Tolerance: Patient tolerated treatment well Patient left: with call bell/phone within reach;in chair Nurse Communication: Mobility status PT Visit Diagnosis: Unsteadiness on feet (R26.81);Other abnormalities of gait and mobility (R26.89);Muscle weakness (generalized) (M62.81);Pain     Time: 1700-1749 PT Time Calculation (min) (ACUTE ONLY): 27 min  Charges:  $Gait Training: 8-22 mins $Therapeutic Exercise: 8-22 mins                     Royetta Asal, PT Acute Rehab Services Pager [phone] [facility] [phone] [facility] [phone]    Rayetta Humphrey 09/20/2019, 11:15 AM

## 2019-09-25 NOTE — Discharge Summary (Signed)
Physician Discharge Summary  Patient ID: Molly Beck MRN: [id number] DOB/AGE: 05-Nov-1965 53 y.o.  Admit date: 09/18/2019 Discharge date: 09/20/2019   Procedures:  Procedure(s) (LRB): TOTAL HIP ARTHROPLASTY ANTERIOR APPROACH (Left)  Attending Physician:  Dr. Paralee Cancel   Admission Diagnoses:   Left hip primary OA / pain  Discharge Diagnoses:  Active Problems:   S/P hip replacement, left   Overweight (BMI 25.0-29.9)  Past Medical History:  Diagnosis Date   Allergy    Anxiety    GERD (gastroesophageal reflux disease)    Headache    migraines   Heartburn    OA (osteoarthritis)    T/O   PONV (postoperative nausea and vomiting)    Recurrent cold sores    Tendon laceration    right  small finger   Wears glasses     HPI:    Molly Beck, 53 y.o. Beck, has a history of pain and functional disability in the left hip(s) due to arthritis and patient has failed non-surgical conservative treatments for greater than 12 weeks to include NSAID's and/or analgesics, use of assistive devices and activity modification.  Onset of symptoms was gradual starting 1-1.5 years ago with gradually worsening course since that time.The patient noted no past surgery on the left hip(s).  Patient currently rates pain in the left hip at 10 out of 10 with activity. Patient has night pain, worsening of pain with activity and weight bearing, trendelenberg gait, pain that interfers with activities of daily living and pain with passive range of motion. Patient has evidence of periarticular osteophytes and joint space narrowing by imaging studies. This condition presents safety issues increasing the risk of falls. There is no current active infection.  Risks, benefits and expectations were discussed with the patient.  Risks including but not limited to the risk of anesthesia, blood clots, nerve damage, blood vessel damage, failure of the prosthesis, infection and up to and including death.   Patient understand the risks, benefits and expectations and wishes to proceed with surgery.   PCP: Scifres, Durel Salts   Discharged Condition: good  Hospital Course:  Patient underwent the above stated procedure on 09/18/2019. Patient tolerated the procedure well and brought to the recovery room in good condition and subsequently to the floor.  POD #1 BP: 117/76 ; Pulse: 74 ; Temp: 98.6 F (37 C) ; Resp: 14 Patient reports pain as mild, pain controlled.  States she had a rough afternoon/night, but much better now.  No otherwise reported events throughout the night.  We discussed the procedure, findings and expectations moving forward.  She did not clear PT to be discharged home safely.  Dorsiflexion/plantar flexion intact, incision: dressing C/D/I, no cellulitis present and compartment soft.   LABS  Basename    HGB     11.3  HCT     35.2   POD #2  BP: 114/67 ; Pulse: Molly ; Temp: 98.6 F (37 C) ; Resp: 16 Patient reports pain as mild to moderate but improved from yesterday.  Able to do more this am than yesterday.  Ready to be discharged home.  Neurovascular intact and incision: dressing C/D/I.   LABS  Basename    HGB     10.3  HCT     31.8    Discharge Exam: General appearance: alert, cooperative and no distress Extremities: Homans sign is negative, no sign of DVT, no edema, redness or tenderness in the calves or thighs and no ulcers, gangrene or trophic changes  Disposition:  Home with follow up in 2 weeks   Follow-up Information    Durene Romans, MD. Schedule an appointment as soon as possible for a visit in 2 weeks.   Specialty: Orthopedic Surgery Contact information: [address] [place] [address] [phone]           Discharge Instructions    Call MD / Call 911   Complete by: As directed    If you experience chest pain or shortness of breath, CALL 911 and be transported to the hospital emergency room.  If you develope a fever above  101 F, pus (white drainage) or increased drainage or redness at the wound, or calf pain, call your surgeon's office.   Change dressing   Complete by: As directed    Maintain surgical dressing until follow up in the clinic. If the edges start to pull up, may reinforce with tape. If the dressing is no longer working, may remove and cover with gauze and tape, but must keep the area dry and clean.  Call with any questions or concerns.   Constipation Prevention   Complete by: As directed    Drink plenty of fluids.  Prune juice may be helpful.  You may use a stool softener, such as Colace (over the counter) 100 mg twice a day.  Use MiraLax (over the counter) for constipation as needed.   Diet - low sodium heart healthy   Complete by: As directed    Discharge instructions   Complete by: As directed    Maintain surgical dressing until follow up in the clinic. If the edges start to pull up, may reinforce with tape. If the dressing is no longer working, may remove and cover with gauze and tape, but must keep the area dry and clean.  Follow up in 2 weeks at [facility]. Call with any questions or concerns.   Increase activity slowly as tolerated   Complete by: As directed    Weight bearing as tolerated with assist device (walker, cane, etc) as directed, use it as long as suggested by your surgeon or therapist, typically at least 4-6 weeks.   TED hose   Complete by: As directed    Use stockings (TED hose) for 2 weeks on both leg(s).  You may remove them at night for sleeping.      Allergies as of 09/20/2019      Reactions   Codeine Nausea Only      Medication List    STOP taking these medications   meloxicam 15 MG tablet Commonly known as: MOBIC   traMADol 50 MG tablet Commonly known as: ULTRAM     TAKE these medications   acetaminophen 500 MG tablet Commonly known as: TYLENOL Take 2 tablets (1,000 mg total) by mouth every 8 (eight) hours. What changed:   medication  strength  how much to take  when to take this  reasons to take this   ALPRAZolam 0.25 MG tablet Commonly known as: XANAX Take 0.25 mg by mouth 2 (two) times daily as needed for anxiety.   aspirin 81 MG chewable tablet Commonly known as: Aspirin Childrens Chew 1 tablet (81 mg total) by mouth 2 (two) times daily. Take for 4 weeks, then resume regular dose.   cetirizine 10 MG tablet Commonly known as: ZYRTEC Take 10 mg by mouth daily.   cholecalciferol 25 MCG (1000 UT) tablet Commonly known as: VITAMIN D3 Take 1,000 Units by mouth 2 (two) times daily.  Divigel 0.5 MG/0.5GM Gel Generic drug: Estradiol Place 1 application onto the skin daily.   docusate sodium 100 MG capsule Commonly known as: Colace Take 1 capsule (100 mg total) by mouth 2 (two) times daily.   eletriptan 40 MG tablet Commonly known as: RELPAX Take 40 mg by mouth as needed for migraine or headache. May repeat in 2 hours if headache persists or recurs.   ferrous sulfate 325 (65 FE) MG tablet Commonly known as: FerrouSul Take 1 tablet (325 mg total) by mouth 3 (three) times daily with meals for 14 days.   fluticasone 50 MCG/ACT nasal spray Commonly known as: FLONASE Place 1 spray into both nostrils daily.   guaiFENesin 600 MG 12 hr tablet Commonly known as: MUCINEX Take 1 tablet (600 mg total) by mouth 2 (two) times daily as needed for to loosen phlegm.   LACTASE PO Take 5 mg by mouth daily as needed (for milk consumption).   levonorgestrel 20 MCG/24HR IUD Commonly known as: MIRENA 1 each by Intrauterine route once.   magnesium oxide 400 MG tablet Commonly known as: MAG-OX Take 400 mg by mouth daily as needed (constipation).   methocarbamol 500 MG tablet Commonly known as: Robaxin Take 1 tablet (500 mg total) by mouth every 6 (six) hours as needed for muscle spasms.   montelukast 10 MG tablet Commonly known as: SINGULAIR Take 10 mg by mouth daily at 2 PM.   multivitamin with minerals  tablet Take 1 tablet by mouth daily.   ondansetron 4 MG tablet Commonly known as: ZOFRAN Take 1 tablet (4 mg total) by mouth every 8 (eight) hours as needed for nausea.   oxyCODONE 5 MG immediate release tablet Commonly known as: Oxy IR/ROXICODONE Take 1-2 tablets (5-10 mg total) by mouth every 4 (four) hours as needed for moderate pain or severe pain.   polyethylene glycol 17 g packet Commonly known as: MIRALAX / GLYCOLAX Take 17 g by mouth 2 (two) times daily.   SIMILASAN ALLERGY EYE RELIEF OP Apply to eye.   valACYclovir 500 MG tablet Commonly known as: VALTREX Take 500 mg by mouth 2 (two) times daily as needed. For cold sores            Discharge Care Instructions  (From admission, onward)         Start     Ordered   09/19/19 0000  Change dressing    Comments: Maintain surgical dressing until follow up in the clinic. If the edges start to pull up, may reinforce with tape. If the dressing is no longer working, may remove and cover with gauze and tape, but must keep the area dry and clean.  Call with any questions or concerns.   09/19/19 0856           Signed: Anastasio AuerbachMatthew S. Azari Janssens   [name]  09/25/2019, 10:31 AM

## 2019-10-16 ENCOUNTER — Other Ambulatory Visit: Payer: Self-pay

## 2019-10-16 ENCOUNTER — Encounter (HOSPITAL_COMMUNITY)
Admission: RE | Admit: 2019-10-16 | Discharge: 2019-10-16 | Disposition: A | Discharge status: Home / Self Care | Payer: PRIVATE HEALTH INSURANCE | Source: Ambulatory Visit | Attending: General Surgery | Admitting: General Surgery

## 2019-10-16 ENCOUNTER — Encounter (HOSPITAL_COMMUNITY): Payer: Self-pay

## 2019-10-16 ENCOUNTER — Other Ambulatory Visit (HOSPITAL_COMMUNITY): Payer: Self-pay

## 2019-10-16 ENCOUNTER — Other Ambulatory Visit (HOSPITAL_COMMUNITY)
Admission: RE | Admit: 2019-10-16 | Discharge: 2019-10-16 | Disposition: A | Discharge status: Home / Self Care | Payer: PRIVATE HEALTH INSURANCE | Source: Ambulatory Visit | Attending: General Surgery | Admitting: General Surgery

## 2019-10-16 DIAGNOSIS — Z20828 Contact with and (suspected) exposure to other viral communicable diseases: Secondary | ICD-10-CM | POA: Insufficient documentation

## 2019-10-16 DIAGNOSIS — Z01812 Encounter for preprocedural laboratory examination: Secondary | ICD-10-CM | POA: Insufficient documentation

## 2019-10-16 DIAGNOSIS — Z933 Colostomy status: Secondary | ICD-10-CM | POA: Insufficient documentation

## 2019-10-16 LAB — CBC
HCT: 39.8 % (ref 36.0–46.0)
Hemoglobin: 12.9 g/dL (ref 12.0–15.0)
MCH: 31 pg (ref 26.0–34.0)
MCHC: 32.4 g/dL (ref 30.0–36.0)
MCV: 95.7 fL (ref 80.0–100.0)
Platelets: 330 10*3/uL (ref 150–400)
RBC: 4.16 MIL/uL (ref 3.87–5.11)
RDW: 14.3 % (ref 11.5–15.5)
WBC: 5 10*3/uL (ref 4.0–10.5)
nRBC: 0 % (ref 0.0–0.2)

## 2019-10-16 LAB — BASIC METABOLIC PANEL
Anion gap: 10 (ref 5–15)
BUN: 9 mg/dL (ref 6–20)
CO2: 24 mmol/L (ref 22–32)
Calcium: 9 mg/dL (ref 8.9–10.3)
Chloride: 107 mmol/L (ref 98–111)
Creatinine, Ser: 0.57 mg/dL (ref 0.44–1.00)
GFR calc Af Amer: >60 mL/min (ref >60)
GFR calc non Af Amer: >60 mL/min (ref >60)
Glucose, Bld: 96 mg/dL (ref 70–99)
Potassium: 3.5 mmol/L (ref 3.5–5.1)
Sodium: 141 mmol/L (ref 135–145)

## 2019-10-16 NOTE — Progress Notes (Signed)
PCP - Eppie Gibson, PA Cardiologist - n/a  Chest x-ray - n/a EKG - n/a Stress Test - n/a ECHO -n/a  Cardiac Cath - n/a  Sleep Study - n/a CPAP - n/a  Fasting Blood Sugar - n/a Checks Blood Sugar _____ times a day  Blood Thinner Instructions:NONE Aspirin Instructions: Last Dose:  Anesthesia review: No review needed.  Patient denies shortness of breath, fever, cough and chest pain at PAT appointment   Patient verbalized understanding of instructions that were given to them at the PAT appointment. Patient was also instructed that they will need to review over the PAT instructions again at home before surgery.

## 2019-10-16 NOTE — Patient Instructions (Addendum)
DUE TO COVID-19 ONLY ONE VISITOR IS ALLOWED TO COME WITH YOU AND STAY IN THE WAITING ROOM ONLY DURING PRE OP AND PROCEDURE DAY OF SURGERY. THE 1 VISITOR MAY VISIT WITH YOU AFTER SURGERY IN YOUR PRIVATE ROOM DURING VISITING HOURS ONLY!  YOU NEED TO HAVE A COVID 19 TEST ON_______ @_______ , THIS TEST MUST BE DONE BEFORE SURGERY, COME  [address], [place] , .  [facility]) ONCE YOUR COVID TEST IS COMPLETED, PLEASE BEGIN THE QUARANTINE INSTRUCTIONS AS OUTLINED IN YOUR HANDOUT.                [facility]   Your procedure is scheduled on:    Report to [facility] Main  Entrance   Report to admitting at AM     Call this number if you have problems the morning of surgery [phone]    Remember: Do not eat food or drink liquids :After Midnight. BRUSH YOUR TEETH MORNING OF SURGERY AND RINSE YOUR MOUTH OUT, NO CHEWING GUM CANDY OR MINTS.  Clear liquids day before surgery, follow all bowel prep instructions from dr 10-11-1998. Drink 2 ensure presurgery drinks night before surgery at 1000 pm. Morning of surgery drink 1 ensure presurgery drink at 430 am. Nothing by mouth after 430 am.   Take these medicines the morning of surgery with A SIP OF WATER: Zyrtec, Xanax, Neurontin and Flonase.                                 You may not have any metal on your body including hair pins and              piercings  Do not wear jewelry, make-up, lotions, powders or perfumes, deodorant             Do not wear nail polish on your fingernails.  Do not shave  48 hours prior to surgery.              Men may shave face and neck.   Do not bring valuables to the hospital. [facility] IS NOT             RESPONSIBLE   FOR VALUABLES.  Contacts, dentures or bridgework may not be worn into surgery.  Leave suitcase in the car. After surgery it may be brought to your room.       Special Instructions: N/A              Please read over the following fact sheets you were  given: _____________________________________________________________________    [facility] - Preparing for Surgery Before surgery, you can play an important role.  Because skin is not sterile, your skin needs to be as free of germs as possible.  You can reduce the number of germs on your skin by washing with CHG (chlorahexidine gluconate) soap before surgery.  CHG is an antiseptic cleaner which kills germs and bonds with the skin to continue killing germs even after washing. Please DO NOT use if you have an allergy to CHG or antibacterial soaps.  If your skin becomes reddened/irritated stop using the CHG and inform your nurse when you arrive at Short Stay. Do not shave (including legs and underarms) for at least 48 hours prior to the first CHG shower.  You may shave your face/neck.  Please follow these instructions carefully:  1.  Shower with CHG Soap the night before  surgery and the  morning of surgery.  2.  If you choose to wash your hair, wash your hair first as usual with your normal  shampoo.  3.  After you shampoo, rinse your hair and body thoroughly to remove the shampoo.                             4.  Use CHG as you would any other liquid soap.  You can apply chg directly to the skin and wash.  Gently with a scrungie or clean washcloth.  5.  Apply the CHG Soap to your body ONLY FROM THE NECK DOWN.   Do   not use on face/ open                           Wound or open sores. Avoid contact with eyes, ears mouth and   genitals (private parts).                       Wash face,  Genitals (private parts) with your normal soap.             6.  Wash thoroughly, paying special attention to the area where your    surgery  will be performed.  7.  Thoroughly rinse your body with warm water from the neck down.  8.  DO NOT shower/wash with your normal soap after using and rinsing off the CHG Soap.                9.  Pat yourself dry with a clean towel.            10.  Wear clean pajamas.             11.  Place clean sheets on your bed the night of your first shower and do not  sleep with pets. Day of Surgery : Do not apply any lotions/deodorants the morning of surgery.  Please wear clean clothes to the hospital/surgery center.  FAILURE TO FOLLOW THESE INSTRUCTIONS MAY RESULT IN THE CANCELLATION OF YOUR SURGERY  PATIENT SIGNATURE_________________________________  NURSE SIGNATURE__________________________________  ________________________________________________________________________

## 2019-10-17 LAB — NOVEL CORONAVIRUS, NAA (HOSP ORDER, SEND-OUT TO REF LAB; TAT 18-24 HRS): SARS-CoV-2, NAA: NOT DETECTED

## 2019-10-19 ENCOUNTER — Other Ambulatory Visit: Payer: Self-pay

## 2019-10-19 ENCOUNTER — Encounter (HOSPITAL_COMMUNITY)
Admission: RE | Disposition: A | Discharge status: Home / Self Care | Payer: Self-pay | Source: Ambulatory Visit | Attending: General Surgery

## 2019-10-19 ENCOUNTER — Inpatient Hospital Stay (HOSPITAL_COMMUNITY): Payer: PRIVATE HEALTH INSURANCE | Admitting: Certified Registered"

## 2019-10-19 ENCOUNTER — Inpatient Hospital Stay (HOSPITAL_COMMUNITY): Payer: PRIVATE HEALTH INSURANCE | Admitting: Physician Assistant

## 2019-10-19 ENCOUNTER — Encounter (HOSPITAL_COMMUNITY): Payer: Self-pay | Admitting: General Surgery

## 2019-10-19 ENCOUNTER — Inpatient Hospital Stay (HOSPITAL_COMMUNITY)
Admission: RE | Admit: 2019-10-19 | Discharge: 2019-10-22 | DRG: 331 | Disposition: A | Discharge status: Home / Self Care | Payer: PRIVATE HEALTH INSURANCE | Source: Ambulatory Visit | Attending: General Surgery | Admitting: General Surgery

## 2019-10-19 DIAGNOSIS — Z87891 Personal history of nicotine dependence: Secondary | ICD-10-CM | POA: Diagnosis not present

## 2019-10-19 DIAGNOSIS — Z20828 Contact with and (suspected) exposure to other viral communicable diseases: Secondary | ICD-10-CM | POA: Diagnosis present

## 2019-10-19 DIAGNOSIS — M1990 Unspecified osteoarthritis, unspecified site: Secondary | ICD-10-CM | POA: Diagnosis present

## 2019-10-19 DIAGNOSIS — K5792 Diverticulitis of intestine, part unspecified, without perforation or abscess without bleeding: Secondary | ICD-10-CM

## 2019-10-19 DIAGNOSIS — Z933 Colostomy status: Secondary | ICD-10-CM

## 2019-10-19 DIAGNOSIS — Z7982 Long term (current) use of aspirin: Secondary | ICD-10-CM

## 2019-10-19 DIAGNOSIS — Z433 Encounter for attention to colostomy: Principal | ICD-10-CM

## 2019-10-19 DIAGNOSIS — K5790 Diverticulosis of intestine, part unspecified, without perforation or abscess without bleeding: Secondary | ICD-10-CM | POA: Diagnosis present

## 2019-10-19 DIAGNOSIS — F411 Generalized anxiety disorder: Secondary | ICD-10-CM

## 2019-10-19 DIAGNOSIS — K219 Gastro-esophageal reflux disease without esophagitis: Secondary | ICD-10-CM | POA: Diagnosis present

## 2019-10-19 DIAGNOSIS — Z96642 Presence of left artificial hip joint: Secondary | ICD-10-CM | POA: Diagnosis present

## 2019-10-19 DIAGNOSIS — Z79899 Other long term (current) drug therapy: Secondary | ICD-10-CM | POA: Diagnosis not present

## 2019-10-19 DIAGNOSIS — Z800 Family history of malignant neoplasm of digestive organs: Secondary | ICD-10-CM | POA: Diagnosis not present

## 2019-10-19 DIAGNOSIS — K631 Perforation of intestine (nontraumatic): Secondary | ICD-10-CM | POA: Diagnosis present

## 2019-10-19 DIAGNOSIS — K660 Peritoneal adhesions (postprocedural) (postinfection): Secondary | ICD-10-CM | POA: Diagnosis present

## 2019-10-19 DIAGNOSIS — G43909 Migraine, unspecified, not intractable, without status migrainosus: Secondary | ICD-10-CM

## 2019-10-19 DIAGNOSIS — Z885 Allergy status to narcotic agent status: Secondary | ICD-10-CM | POA: Diagnosis not present

## 2019-10-19 HISTORY — DX: Other ascites: R18.8

## 2019-10-19 HISTORY — DX: Unspecified intestinal obstruction, unspecified as to partial versus complete obstruction: K56.609

## 2019-10-19 HISTORY — PX: XI ROBOTIC ASSISTED COLOSTOMY TAKEDOWN: SHX6828

## 2019-10-19 SURGERY — CLOSURE, COLOSTOMY, ROBOT-ASSISTED
Anesthesia: General | Site: Abdomen

## 2019-10-19 MED ORDER — GABAPENTIN 300 MG PO CAPS
300.0000 mg | ORAL_CAPSULE | ORAL | Status: AC
  Administered 2019-10-19: 300 mg via ORAL
  Filled 2019-10-19: qty 1

## 2019-10-19 MED ORDER — SCOPOLAMINE 1 MG/3DAYS TD PT72
1.0000 | MEDICATED_PATCH | TRANSDERMAL | Status: DC
  Administered 2019-10-19: 1 via TRANSDERMAL
  Filled 2019-10-19: qty 1

## 2019-10-19 MED ORDER — PROMETHAZINE HCL 25 MG/ML IJ SOLN: 6.2500 mg | INTRAMUSCULAR | Status: DC | PRN

## 2019-10-19 MED ORDER — ACETAMINOPHEN 500 MG PO TABS
1000.0000 mg | ORAL_TABLET | Freq: Four times a day (QID) | ORAL | Status: DC
  Administered 2019-10-19 – 2019-10-22 (×11): 1000 mg via ORAL
  Filled 2019-10-19 (×12): qty 2

## 2019-10-19 MED ORDER — GABAPENTIN 300 MG PO CAPS
300.0000 mg | ORAL_CAPSULE | Freq: Two times a day (BID) | ORAL | Status: DC
  Administered 2019-10-19 (×2): 300 mg via ORAL
  Filled 2019-10-19 (×2): qty 1

## 2019-10-19 MED ORDER — GABAPENTIN 300 MG PO CAPS: 300.0000 mg | ORAL_CAPSULE | Freq: Three times a day (TID) | ORAL | Status: DC | PRN

## 2019-10-19 MED ORDER — KETOROLAC TROMETHAMINE 30 MG/ML IJ SOLN
INTRAMUSCULAR | Status: AC
  Filled 2019-10-19: qty 1

## 2019-10-19 MED ORDER — HEPARIN SODIUM (PORCINE) 5000 UNIT/ML IJ SOLN
5000.0000 [IU] | Freq: Once | INTRAMUSCULAR | Status: AC
  Administered 2019-10-19: 5000 [IU] via SUBCUTANEOUS
  Filled 2019-10-19: qty 1

## 2019-10-19 MED ORDER — PHENYLEPHRINE HCL (PRESSORS) 10 MG/ML IV SOLN
INTRAVENOUS | Status: AC
  Filled 2019-10-19: qty 1

## 2019-10-19 MED ORDER — METHOCARBAMOL 500 MG PO TABS
500.0000 mg | ORAL_TABLET | Freq: Four times a day (QID) | ORAL | Status: DC | PRN
  Administered 2019-10-19: 500 mg via ORAL
  Filled 2019-10-19: qty 1

## 2019-10-19 MED ORDER — FENTANYL CITRATE (PF) 250 MCG/5ML IJ SOLN
INTRAMUSCULAR | Status: DC | PRN
  Administered 2019-10-19: 50 ug via INTRAVENOUS
  Administered 2019-10-19: 100 ug via INTRAVENOUS

## 2019-10-19 MED ORDER — KCL IN DEXTROSE-NACL 20-5-0.45 MEQ/L-%-% IV SOLN
INTRAVENOUS | Status: DC
  Filled 2019-10-19 (×2): qty 1000

## 2019-10-19 MED ORDER — ONDANSETRON HCL 4 MG PO TABS: 4.0000 mg | ORAL_TABLET | Freq: Four times a day (QID) | ORAL | Status: DC | PRN

## 2019-10-19 MED ORDER — ONDANSETRON HCL 4 MG/2ML IJ SOLN
INTRAMUSCULAR | Status: DC | PRN
  Administered 2019-10-19: 4 mg via INTRAVENOUS

## 2019-10-19 MED ORDER — ENOXAPARIN SODIUM 40 MG/0.4ML ~~LOC~~ SOLN
40.0000 mg | SUBCUTANEOUS | Status: DC
  Administered 2019-10-20 – 2019-10-22 (×3): 40 mg via SUBCUTANEOUS
  Filled 2019-10-19 (×3): qty 0.4

## 2019-10-19 MED ORDER — KETAMINE HCL 10 MG/ML IJ SOLN
INTRAMUSCULAR | Status: AC
  Filled 2019-10-19: qty 1

## 2019-10-19 MED ORDER — ALPRAZOLAM 0.25 MG PO TABS: 0.2500 mg | ORAL_TABLET | Freq: Two times a day (BID) | ORAL | Status: DC | PRN

## 2019-10-19 MED ORDER — LIDOCAINE HCL 2 % IJ SOLN
INTRAMUSCULAR | Status: AC
  Filled 2019-10-19: qty 20

## 2019-10-19 MED ORDER — MIDAZOLAM HCL 2 MG/2ML IJ SOLN
INTRAMUSCULAR | Status: DC | PRN
  Administered 2019-10-19: 2 mg via INTRAVENOUS

## 2019-10-19 MED ORDER — DEXAMETHASONE SODIUM PHOSPHATE 10 MG/ML IJ SOLN
INTRAMUSCULAR | Status: DC | PRN
  Administered 2019-10-19: 8 mg via INTRAVENOUS

## 2019-10-19 MED ORDER — KETAMINE HCL 10 MG/ML IJ SOLN
INTRAMUSCULAR | Status: DC | PRN
  Administered 2019-10-19: 15 mg via INTRAVENOUS
  Administered 2019-10-19 (×2): 10 mg via INTRAVENOUS

## 2019-10-19 MED ORDER — LIDOCAINE 20MG/ML (2%) 15 ML SYRINGE OPTIME
INTRAMUSCULAR | Status: DC | PRN
  Administered 2019-10-19: 1.5 mg/kg/h via INTRAVENOUS

## 2019-10-19 MED ORDER — DEXAMETHASONE SODIUM PHOSPHATE 10 MG/ML IJ SOLN
INTRAMUSCULAR | Status: AC
  Filled 2019-10-19: qty 1

## 2019-10-19 MED ORDER — BUPIVACAINE HCL 0.25 % IJ SOLN
INTRAMUSCULAR | Status: DC | PRN
  Administered 2019-10-19: 30 mL

## 2019-10-19 MED ORDER — ALVIMOPAN 12 MG PO CAPS
12.0000 mg | ORAL_CAPSULE | ORAL | Status: AC
  Administered 2019-10-19: 12 mg via ORAL
  Filled 2019-10-19: qty 1

## 2019-10-19 MED ORDER — HYDROMORPHONE HCL 1 MG/ML IJ SOLN
0.2500 mg | INTRAMUSCULAR | Status: DC | PRN
  Administered 2019-10-19 (×2): 0.5 mg via INTRAVENOUS

## 2019-10-19 MED ORDER — PROPOFOL 10 MG/ML IV BOLUS
INTRAVENOUS | Status: AC
  Filled 2019-10-19: qty 40

## 2019-10-19 MED ORDER — LACTATED RINGERS IV SOLN: INTRAVENOUS | Status: DC

## 2019-10-19 MED ORDER — PHENYLEPHRINE 40 MCG/ML (10ML) SYRINGE FOR IV PUSH (FOR BLOOD PRESSURE SUPPORT)
PREFILLED_SYRINGE | INTRAVENOUS | Status: DC | PRN
  Administered 2019-10-19 (×2): 80 ug via INTRAVENOUS
  Administered 2019-10-19 (×2): 40 ug via INTRAVENOUS

## 2019-10-19 MED ORDER — FLUTICASONE PROPIONATE 50 MCG/ACT NA SUSP
1.0000 | Freq: Every day | NASAL | Status: DC
  Administered 2019-10-20 – 2019-10-22 (×3): 1 via NASAL
  Filled 2019-10-19: qty 16

## 2019-10-19 MED ORDER — ENSURE SURGERY PO LIQD
237.0000 mL | Freq: Two times a day (BID) | ORAL | Status: DC
  Administered 2019-10-20 – 2019-10-22 (×5): 237 mL via ORAL
  Filled 2019-10-19 (×7): qty 237

## 2019-10-19 MED ORDER — PHENYLEPHRINE 40 MCG/ML (10ML) SYRINGE FOR IV PUSH (FOR BLOOD PRESSURE SUPPORT)
PREFILLED_SYRINGE | INTRAVENOUS | Status: AC
  Filled 2019-10-19: qty 10

## 2019-10-19 MED ORDER — SODIUM CHLORIDE 0.9 % IV SOLN
2.0000 g | INTRAVENOUS | Status: AC
  Administered 2019-10-19: 2 g via INTRAVENOUS
  Filled 2019-10-19: qty 2

## 2019-10-19 MED ORDER — PHENYLEPHRINE HCL-NACL 10-0.9 MG/250ML-% IV SOLN
INTRAVENOUS | Status: DC | PRN
  Administered 2019-10-19: 20 ug/min via INTRAVENOUS

## 2019-10-19 MED ORDER — ONDANSETRON HCL 4 MG/2ML IJ SOLN
INTRAMUSCULAR | Status: AC
  Filled 2019-10-19: qty 2

## 2019-10-19 MED ORDER — ALVIMOPAN 12 MG PO CAPS
12.0000 mg | ORAL_CAPSULE | Freq: Two times a day (BID) | ORAL | Status: DC
  Administered 2019-10-20: 12 mg via ORAL
  Filled 2019-10-19: qty 1

## 2019-10-19 MED ORDER — LIDOCAINE 2% (20 MG/ML) 5 ML SYRINGE
INTRAMUSCULAR | Status: AC
  Filled 2019-10-19: qty 5

## 2019-10-19 MED ORDER — BUPIVACAINE HCL 0.25 % IJ SOLN
INTRAMUSCULAR | Status: AC
  Filled 2019-10-19: qty 1

## 2019-10-19 MED ORDER — MIDAZOLAM HCL 2 MG/2ML IJ SOLN
INTRAMUSCULAR | Status: AC
  Filled 2019-10-19: qty 2

## 2019-10-19 MED ORDER — ELETRIPTAN HYDROBROMIDE 40 MG PO TABS
40.0000 mg | ORAL_TABLET | ORAL | Status: DC | PRN
  Filled 2019-10-19: qty 1

## 2019-10-19 MED ORDER — SUGAMMADEX SODIUM 200 MG/2ML IV SOLN
INTRAVENOUS | Status: DC | PRN
  Administered 2019-10-19: 150 mg via INTRAVENOUS

## 2019-10-19 MED ORDER — EPHEDRINE SULFATE-NACL 50-0.9 MG/10ML-% IV SOSY
PREFILLED_SYRINGE | INTRAVENOUS | Status: DC | PRN
  Administered 2019-10-19: 10 mg via INTRAVENOUS

## 2019-10-19 MED ORDER — OXYCODONE HCL 5 MG PO TABS: 5.0000 mg | ORAL_TABLET | Freq: Once | ORAL | Status: DC | PRN

## 2019-10-19 MED ORDER — FENTANYL CITRATE (PF) 100 MCG/2ML IJ SOLN
INTRAMUSCULAR | Status: AC
  Filled 2019-10-19: qty 2

## 2019-10-19 MED ORDER — EPHEDRINE 5 MG/ML INJ
INTRAVENOUS | Status: AC
  Filled 2019-10-19: qty 20

## 2019-10-19 MED ORDER — SACCHAROMYCES BOULARDII 250 MG PO CAPS
250.0000 mg | ORAL_CAPSULE | Freq: Two times a day (BID) | ORAL | Status: DC
  Administered 2019-10-19 – 2019-10-22 (×6): 250 mg via ORAL
  Filled 2019-10-19 (×6): qty 1

## 2019-10-19 MED ORDER — DIPHENHYDRAMINE HCL 25 MG PO CAPS: 25.0000 mg | ORAL_CAPSULE | Freq: Four times a day (QID) | ORAL | Status: DC | PRN

## 2019-10-19 MED ORDER — LACTATED RINGERS IV SOLN: INTRAVENOUS | Status: DC | PRN

## 2019-10-19 MED ORDER — LORATADINE 10 MG PO TABS
10.0000 mg | ORAL_TABLET | Freq: Every day | ORAL | Status: DC
  Administered 2019-10-20 – 2019-10-22 (×3): 10 mg via ORAL
  Filled 2019-10-19 (×3): qty 1

## 2019-10-19 MED ORDER — ACETAMINOPHEN 500 MG PO TABS
1000.0000 mg | ORAL_TABLET | ORAL | Status: AC
  Administered 2019-10-19: 06:00:00 1000 mg via ORAL
  Filled 2019-10-19: qty 2

## 2019-10-19 MED ORDER — DIPHENHYDRAMINE HCL 50 MG/ML IJ SOLN: 25.0000 mg | Freq: Four times a day (QID) | INTRAMUSCULAR | Status: DC | PRN

## 2019-10-19 MED ORDER — BUPIVACAINE LIPOSOME 1.3 % IJ SUSP
20.0000 mL | Freq: Once | INTRAMUSCULAR | Status: AC
  Administered 2019-10-19: 20 mL
  Filled 2019-10-19: qty 20

## 2019-10-19 MED ORDER — LIDOCAINE 2% (20 MG/ML) 5 ML SYRINGE
INTRAMUSCULAR | Status: DC | PRN
  Administered 2019-10-19: 60 mg via INTRAVENOUS

## 2019-10-19 MED ORDER — PROPOFOL 10 MG/ML IV BOLUS
INTRAVENOUS | Status: DC | PRN
  Administered 2019-10-19: 150 mg via INTRAVENOUS

## 2019-10-19 MED ORDER — ONDANSETRON HCL 4 MG/2ML IJ SOLN: 4.0000 mg | Freq: Four times a day (QID) | INTRAMUSCULAR | Status: DC | PRN

## 2019-10-19 MED ORDER — ROCURONIUM BROMIDE 10 MG/ML (PF) SYRINGE
PREFILLED_SYRINGE | INTRAVENOUS | Status: DC | PRN
  Administered 2019-10-19 (×3): 20 mg via INTRAVENOUS
  Administered 2019-10-19: 50 mg via INTRAVENOUS

## 2019-10-19 MED ORDER — MEPERIDINE HCL 50 MG/ML IJ SOLN: 6.2500 mg | INTRAMUSCULAR | Status: DC | PRN

## 2019-10-19 MED ORDER — MONTELUKAST SODIUM 10 MG PO TABS
10.0000 mg | ORAL_TABLET | Freq: Every evening | ORAL | Status: DC
  Administered 2019-10-19 – 2019-10-21 (×3): 10 mg via ORAL
  Filled 2019-10-19 (×3): qty 1

## 2019-10-19 MED ORDER — ROCURONIUM BROMIDE 10 MG/ML (PF) SYRINGE
PREFILLED_SYRINGE | INTRAVENOUS | Status: AC
  Filled 2019-10-19: qty 10

## 2019-10-19 MED ORDER — HYDROMORPHONE HCL 1 MG/ML IJ SOLN
0.5000 mg | INTRAMUSCULAR | Status: DC | PRN
  Administered 2019-10-19 – 2019-10-21 (×6): 0.5 mg via INTRAVENOUS
  Filled 2019-10-19 (×6): qty 0.5

## 2019-10-19 MED ORDER — KETOROLAC TROMETHAMINE 30 MG/ML IJ SOLN
30.0000 mg | Freq: Once | INTRAMUSCULAR | Status: AC | PRN
  Administered 2019-10-19: 30 mg via INTRAVENOUS

## 2019-10-19 MED ORDER — TRAMADOL HCL 50 MG PO TABS
50.0000 mg | ORAL_TABLET | Freq: Four times a day (QID) | ORAL | Status: DC | PRN
  Administered 2019-10-20: 50 mg via ORAL
  Filled 2019-10-19: qty 1

## 2019-10-19 MED ORDER — ALUM & MAG HYDROXIDE-SIMETH 200-200-20 MG/5ML PO SUSP: 30.0000 mL | Freq: Four times a day (QID) | ORAL | Status: DC | PRN

## 2019-10-19 MED ORDER — RINGERS IRRIGATION IR SOLN
Status: DC | PRN
  Administered 2019-10-19: 1000 mL

## 2019-10-19 MED ORDER — OXYCODONE HCL 5 MG/5ML PO SOLN: 5.0000 mg | Freq: Once | ORAL | Status: DC | PRN

## 2019-10-19 MED ORDER — HYDROMORPHONE HCL 1 MG/ML IJ SOLN
INTRAMUSCULAR | Status: AC
  Administered 2019-10-19: 0.5 mg via INTRAVENOUS
  Filled 2019-10-19: qty 2

## 2019-10-19 MED ORDER — METHYLENE BLUE 0.5 % INJ SOLN
INTRAVENOUS | Status: AC
  Filled 2019-10-19: qty 10

## 2019-10-19 SURGICAL SUPPLY — 108 items
ADH SKN CLS APL DERMABOND .7 (GAUZE/BANDAGES/DRESSINGS) ×1
BLADE EXTENDED COATED 6.5IN (ELECTRODE) IMPLANT
CANNULA REDUC XI 12-8 STAPL (CANNULA) ×1
CANNULA REDUC XI 12-8MM STAPL (CANNULA) ×1
CANNULA REDUCER 12-8 DVNC XI (CANNULA) ×1 IMPLANT
CELLS DAT CNTRL 66122 CELL SVR (MISCELLANEOUS) IMPLANT
CLIP VESOLOCK LG 6/CT PURPLE (CLIP) IMPLANT
CLIP VESOLOCK MED 6/CT (CLIP) IMPLANT
COVER SURGICAL LIGHT HANDLE (MISCELLANEOUS) ×6 IMPLANT
COVER TIP SHEARS 8 DVNC (MISCELLANEOUS) ×1 IMPLANT
COVER TIP SHEARS 8MM DA VINCI (MISCELLANEOUS) ×2
COVER WAND RF STERILE (DRAPES) ×3 IMPLANT
DECANTER SPIKE VIAL GLASS SM (MISCELLANEOUS) IMPLANT
DERMABOND ADVANCED (GAUZE/BANDAGES/DRESSINGS) ×2
DERMABOND ADVANCED .7 DNX12 (GAUZE/BANDAGES/DRESSINGS) IMPLANT
DRAIN CHANNEL 19F RND (DRAIN) IMPLANT
DRAPE ARM DVNC X/XI (DISPOSABLE) ×4 IMPLANT
DRAPE COLUMN DVNC XI (DISPOSABLE) ×1 IMPLANT
DRAPE DA VINCI XI ARM (DISPOSABLE) ×8
DRAPE DA VINCI XI COLUMN (DISPOSABLE) ×2
DRAPE SURG IRRIG POUCH 19X23 (DRAPES) ×3 IMPLANT
DRSG OPSITE POSTOP 4X10 (GAUZE/BANDAGES/DRESSINGS) IMPLANT
DRSG OPSITE POSTOP 4X6 (GAUZE/BANDAGES/DRESSINGS) IMPLANT
DRSG OPSITE POSTOP 4X8 (GAUZE/BANDAGES/DRESSINGS) IMPLANT
DRSG TELFA 3X8 NADH (GAUZE/BANDAGES/DRESSINGS) ×3 IMPLANT
ELECT REM PT RETURN 15FT ADLT (MISCELLANEOUS) ×3 IMPLANT
ENDOLOOP SUT PDS II  0 18 (SUTURE)
ENDOLOOP SUT PDS II 0 18 (SUTURE) IMPLANT
EVACUATOR SILICONE 100CC (DRAIN) IMPLANT
GAUZE SPONGE 4X4 12PLY STRL (GAUZE/BANDAGES/DRESSINGS) ×2 IMPLANT
GLOVE BIO SURGEON STRL SZ 6.5 (GLOVE) ×6 IMPLANT
GLOVE BIO SURGEONS STRL SZ 6.5 (GLOVE) ×3
GLOVE BIOGEL PI IND STRL 7.0 (GLOVE) ×3 IMPLANT
GLOVE BIOGEL PI INDICATOR 7.0 (GLOVE) ×6
GOWN STRL REUS W/TWL XL LVL3 (GOWN DISPOSABLE) ×21 IMPLANT
GRASPER ENDOPATH ANVIL 10MM (MISCELLANEOUS) IMPLANT
GRASPER SUT TROCAR 14GX15 (MISCELLANEOUS) IMPLANT
HOLDER FOLEY CATH W/STRAP (MISCELLANEOUS) ×3 IMPLANT
IRRIG SUCT STRYKERFLOW 2 WTIP (MISCELLANEOUS) ×3
IRRIGATION SUCT STRKRFLW 2 WTP (MISCELLANEOUS) ×1 IMPLANT
IRRIGATOR SUCT 8 DISP DVNC XI (IRRIGATION / IRRIGATOR) IMPLANT
IRRIGATOR SUCTION 8MM XI DISP (IRRIGATION / IRRIGATOR)
KIT PROCEDURE DA VINCI SI (MISCELLANEOUS)
KIT PROCEDURE DVNC SI (MISCELLANEOUS) IMPLANT
KIT TURNOVER KIT A (KITS) IMPLANT
NDL INSUFFLATION 14GA 120MM (NEEDLE) ×1 IMPLANT
NEEDLE INSUFFLATION 14GA 120MM (NEEDLE) ×3 IMPLANT
PACK CARDIOVASCULAR III (CUSTOM PROCEDURE TRAY) ×3 IMPLANT
PACK COLON (CUSTOM PROCEDURE TRAY) ×3 IMPLANT
PAD DRESSING TELFA 3X8 NADH (GAUZE/BANDAGES/DRESSINGS) IMPLANT
PENCIL SMOKE EVACUATOR (MISCELLANEOUS) IMPLANT
PORT LAP GEL ALEXIS MED 5-9CM (MISCELLANEOUS) ×3 IMPLANT
RELOAD STAPLE 45 BLU REG DVNC (STAPLE) IMPLANT
RELOAD STAPLE 45 GRN THCK DVNC (STAPLE) IMPLANT
RELOAD STAPLE 60 3.5 BLU DVNC (STAPLE) IMPLANT
RELOAD STAPLER 3.5X60 BLU DVNC (STAPLE) ×1 IMPLANT
RETRACTOR WND ALEXIS 18 MED (MISCELLANEOUS) IMPLANT
RTRCTR WOUND ALEXIS 18CM MED (MISCELLANEOUS)
SCISSORS LAP 5X35 DISP (ENDOMECHANICALS) ×3 IMPLANT
SEAL CANN UNIV 5-8 DVNC XI (MISCELLANEOUS) ×3 IMPLANT
SEAL XI 5MM-8MM UNIVERSAL (MISCELLANEOUS) ×6
SEALER VESSEL DA VINCI XI (MISCELLANEOUS) ×2
SEALER VESSEL EXT DVNC XI (MISCELLANEOUS) ×1 IMPLANT
SLEEVE ADV FIXATION 5X100MM (TROCAR) IMPLANT
SOLUTION ELECTROLUBE (MISCELLANEOUS) ×3 IMPLANT
STAPLER 45 BLU RELOAD XI (STAPLE) IMPLANT
STAPLER 45 BLUE RELOAD XI (STAPLE)
STAPLER 45 GREEN RELOAD XI (STAPLE)
STAPLER 45 GRN RELOAD XI (STAPLE) IMPLANT
STAPLER 60 DA VINCI SURE FORM (STAPLE) ×2
STAPLER 60 SUREFORM DVNC (STAPLE) IMPLANT
STAPLER CANNULA SEAL DVNC XI (STAPLE) ×1 IMPLANT
STAPLER CANNULA SEAL XI (STAPLE) ×2
STAPLER ECHELON POWER CIR 29 (STAPLE) ×2 IMPLANT
STAPLER RELOAD 3.5X60 BLU DVNC (STAPLE) ×1
STAPLER RELOAD 3.5X60 BLUE (STAPLE) ×2
STAPLER SHEATH (SHEATH) ×2
STAPLER SHEATH ENDOWRIST DVNC (SHEATH) ×1 IMPLANT
STAPLER VISISTAT 35W (STAPLE) IMPLANT
STOPCOCK 4 WAY LG BORE MALE ST (IV SETS) ×6 IMPLANT
SUT ETHILON 2 0 PS N (SUTURE) IMPLANT
SUT NOVA NAB DX-16 0-1 5-0 T12 (SUTURE) ×6 IMPLANT
SUT PROLENE 2 0 KS (SUTURE) ×3 IMPLANT
SUT SILK 2 0 (SUTURE) ×3
SUT SILK 2 0 SH CR/8 (SUTURE) IMPLANT
SUT SILK 2-0 18XBRD TIE 12 (SUTURE) ×1 IMPLANT
SUT SILK 3 0 (SUTURE)
SUT SILK 3 0 SH CR/8 (SUTURE) ×3 IMPLANT
SUT SILK 3-0 18XBRD TIE 12 (SUTURE) IMPLANT
SUT V-LOC BARB 180 2/0GR6 GS22 (SUTURE)
SUT VIC AB 2-0 SH 18 (SUTURE) IMPLANT
SUT VIC AB 2-0 SH 27 (SUTURE) ×3
SUT VIC AB 2-0 SH 27X BRD (SUTURE) ×1 IMPLANT
SUT VIC AB 3-0 SH 18 (SUTURE) IMPLANT
SUT VIC AB 4-0 PS2 27 (SUTURE) ×6 IMPLANT
SUT VICRYL 0 UR6 27IN ABS (SUTURE) ×3 IMPLANT
SUTURE V-LC BRB 180 2/0GR6GS22 (SUTURE) IMPLANT
SYR 10ML ECCENTRIC (SYRINGE) ×3 IMPLANT
SYS LAPSCP GELPORT 120MM (MISCELLANEOUS)
SYSTEM LAPSCP GELPORT 120MM (MISCELLANEOUS) IMPLANT
TAPE CLOTH SURG 4X10 WHT LF (GAUZE/BANDAGES/DRESSINGS) ×2 IMPLANT
TOWEL OR 17X26 10 PK STRL BLUE (TOWEL DISPOSABLE) IMPLANT
TOWEL OR NON WOVEN STRL DISP B (DISPOSABLE) ×3 IMPLANT
TRAY FOLEY MTR SLVR 16FR STAT (SET/KITS/TRAYS/PACK) ×3 IMPLANT
TROCAR ADV FIXATION 5X100MM (TROCAR) ×3 IMPLANT
TUBING CONNECTING 10 (TUBING) ×4 IMPLANT
TUBING CONNECTING 10' (TUBING) ×2
TUBING INSUFFLATION 10FT LAP (TUBING) ×3 IMPLANT

## 2019-10-19 NOTE — Discharge Instructions (Signed)

## 2019-10-19 NOTE — Anesthesia Postprocedure Evaluation (Signed)
Anesthesia Post Note  Patient: Molly Beck  Procedure(s) Performed: XI ROBOTIC ASSISTED COLOSTOMY REVERSAL (N/A Abdomen)     Patient location during evaluation: PACU Anesthesia Type: General Level of consciousness: awake and alert Pain management: pain level controlled Vital Signs Assessment: post-procedure vital signs reviewed and stable Respiratory status: spontaneous breathing, nonlabored ventilation, respiratory function stable and patient connected to nasal cannula oxygen Cardiovascular status: blood pressure returned to baseline and stable Postop Assessment: no apparent nausea or vomiting Anesthetic complications: no    Last Vitals:  Vitals:   10/19/19 1413 10/19/19 1520  BP: 98/67 109/78  Pulse: 64 70  Resp: 18 16  Temp:    SpO2: 99% 95%    Last Pain:  Vitals:   10/19/19 1315  TempSrc: Oral  PainSc:                  Pervis Hocking

## 2019-10-19 NOTE — H&P (Signed)
HPI: 53 y.o. F with colostomy placement due to sigmoid perforation and stricture.  She is here today for reversal.  Past Medical History:  Diagnosis Date  . Allergy   . Anxiety   . GERD (gastroesophageal reflux disease)   . Headache    migraines  . Heartburn   . OA (osteoarthritis)    T/O  . PONV (postoperative nausea and vomiting)   . Recurrent cold sores   . Tendon laceration    right  small finger  . Wears glasses    Past Surgical History:  Procedure Laterality Date  . COLON RESECTION N/A 04/18/2019   Procedure: EXPLORATORY LAPAROTOMY, HARTMAN'S RESECTION AND SMALL BOWEL RESECTION WITH COLOSTOMY;  Surgeon: Romie Levee, MD;  Location: [facility];  Service: General;  Laterality: N/A;  . COLONOSCOPY N/A 07/12/2019   Procedure: COLONOSCOPY THROUGH OSTOMY;  Surgeon: Romie Levee, MD;  Location: [facility];  Service: Endoscopy;  Laterality: N/A;  IV SEDATION BY SURGEON  . CYSTOSCOPY WITH STENT PLACEMENT Bilateral 04/18/2019   Procedure: CYSTOSCOPY WITH STENT PLACEMENT;  Surgeon: Alfredo Martinez, MD;  Location: [facility];  Service: Urology;  Laterality: Bilateral;  . FOOT SURGERY     tendon and ligament repair with screws  03-27-19  . I & D EXTREMITY Right 05/31/2017   Procedure: Right small finger irrigation and debridement and flexor tendon reconstruction;  Surgeon: Dominica Severin, MD;  Location: [facility];  Service: Orthopedics;  Laterality: Right;  . KNEE ARTHROSCOPY     B/L  . MEDIAL PARTIAL KNEE REPLACEMENT     bil   . TENDON REPAIR     left thumb  . TOTAL HIP ARTHROPLASTY Left 09/18/2019   Procedure: TOTAL HIP ARTHROPLASTY ANTERIOR APPROACH;  Surgeon: Durene Romans, MD;  Location: [facility];  Service: Orthopedics;  Laterality: Left;  70 mins  . WISDOM TOOTH EXTRACTION     Family History  Problem Relation Age of Onset  . Macular degeneration Mother   . Pancreatic cancer Father   . Other Sister    Social History   Socioeconomic History  . Marital status: Divorced    Spouse  name: Not on file  . Number of children: Not on file  . Years of education: Not on file  . Highest education level: Not on file  Occupational History  . Not on file  Tobacco Use  . Smoking status: Former Games developer  . Smokeless tobacco: Never Used  . Tobacco comment: during college only  Substance and Sexual Activity  . Alcohol use: Yes    Comment: occasional  . Drug use: No  . Sexual activity: Yes  Other Topics Concern  . Not on file  Social History Narrative  . Not on file   Social Determinants of Health   Financial Resource Strain:   . Difficulty of Paying Living Expenses: Not on file  Food Insecurity:   . Worried About Programme researcher, broadcasting/film/video in the Last Year: Not on file  . Ran Out of Food in the Last Year: Not on file  Transportation Needs:   . Lack of Transportation (Medical): Not on file  . Lack of Transportation (Non-Medical): Not on file  Physical Activity:   . Days of Exercise per Week: Not on file  . Minutes of Exercise per Session: Not on file  Stress:   . Feeling of Stress : Not on file  Social Connections:   . Frequency of Communication with Friends and Family: Not on file  . Frequency of Social Gatherings with Friends  and Family: Not on file  . Attends Religious Services: Not on file  . Active Member of Clubs or Organizations: Not on file  . Attends Archivist Meetings: Not on file  . Marital Status: Not on file  Intimate Partner Violence:   . Fear of Current or Ex-Partner: Not on file  . Emotionally Abused: Not on file  . Physically Abused: Not on file  . Sexually Abused: Not on file   Review of Systems - General ROS: negative for - chills or fever Respiratory ROS: no cough, shortness of breath, or wheezing Cardiovascular ROS: no chest pain or dyspnea on exertion Gastrointestinal ROS: no abdominal pain, change in bowel habits, or black or bloody stools Genito-Urinary ROS: no dysuria, trouble voiding, or hematuria   BP 116/84   Pulse 100    Temp 98.3 F (36.8 C)   Resp 13   Ht 5\' 4"  (1.626 m)   Wt 75.7 kg   SpO2 98%   BMI 28.63 kg/m    Physical Exam Constitutional:      General: She is not in acute distress.    Appearance: Normal appearance.  HENT:     Head: Normocephalic and atraumatic.     Nose: Nose normal.     Mouth/Throat:     Mouth: Mucous membranes are moist.  Eyes:     Extraocular Movements: Extraocular movements intact.     Conjunctiva/sclera: Conjunctivae normal.     Pupils: Pupils are equal, round, and reactive to light.  Cardiovascular:     Rate and Rhythm: Normal rate and regular rhythm.  Pulmonary:     Effort: Pulmonary effort is normal. No respiratory distress.  Abdominal:     General: Abdomen is flat. There is no distension.     Palpations: Abdomen is soft.     Tenderness: There is no abdominal tenderness.  Musculoskeletal:        General: Normal range of motion.     Cervical back: Normal range of motion and neck supple.  Skin:    General: Skin is warm and dry.  Neurological:     Mental Status: She is alert.    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  We will plan on robotic ostomy reversal today.  The surgery and anatomy were described to the patient as well as the risks of surgery and the possible complications.  These include: Bleeding, deep abdominal infections and possible wound complications such as hernia and infection, damage to adjacent structures, leak of surgical connections, which can lead to other surgeries and possibly an ostomy, possible need for other procedures, such as abscess drains in radiology, possible prolonged hospital stay, possible diarrhea from removal of part of the colon, possible constipation from narcotics, prolonged fatigue/weakness or appetite loss, possible early recurrence of of disease, possible complications of their medical problems such as heart disease or arrhythmias or lung problems, death (less than 1%). I believe the patient  understands and wishes to proceed with the surgery.

## 2019-10-19 NOTE — Op Note (Signed)
10/19/2019  10:28 AM  PATIENT:  Molly Beck  53 y.o. female  Patient Care Team: Scifres, Peter Minium as PCP - General (Physician Assistant)  PRE-OPERATIVE DIAGNOSIS:  COLOSTOMY, DIVERTICULAR DISEASE  POST-OPERATIVE DIAGNOSIS:  COLOSTOMY, DIVERTICULAR DISEASE  PROCEDURE:  XI ROBOTIC ASSISTED COLOSTOMY REVERSAL   Surgeon(s): Romie Levee, MD Karie Soda, MD  ASSISTANT: Dr Michaell Cowing   ANESTHESIA:   local and general  EBL: 79ml  Total I/O In: 1100 [I.V.:1000; IV Piggyback:100] Out: 570 [Urine:550; Blood:20]  Delay start of Pharmacological VTE agent (>24hrs) due to surgical blood loss or risk of bleeding:  no  DRAINS: none   SPECIMEN:  Source of Specimen:  Rectosigmoid colon, colostomy  DISPOSITION OF SPECIMEN:  PATHOLOGY  COUNTS:  YES  PLAN OF CARE: Admit to inpatient   PATIENT DISPOSITION:  PACU - hemodynamically stable.  INDICATION:    53 y.o. F s/p Hartman's procedure.  She is now ready for colostomy reversal.  The anatomy & physiology of the digestive tract was discussed.  The pathophysiology was discussed.  Natural history risks without surgery was discussed.   I worked to give an overview of the disease and the frequent need to have multispecialty involvement.  I feel the risks of no intervention will lead to serious problems that outweigh the operative risks; therefore, I recommended a partial colectomy to remove the pathology.  Laparoscopic & open techniques were discussed.   Risks such as bleeding, infection, abscess, leak, reoperation, possible ostomy, hernia, heart attack, death, and other risks were discussed.  I noted a good likelihood this will help address the problem.   Goals of post-operative recovery were discussed as well.    The patient expressed understanding & wished to proceed with surgery.  OR FINDINGS:   Patient had significantly inflamed pelvic peritoneum but minimal small bowel adhesions.  The anastomosis rests 10 cm from the anal  verge by rigid proctoscopy.  DESCRIPTION:   Informed consent was confirmed.  The patient underwent general anaesthesia without difficulty.  The patient was positioned appropriately.  VTE prevention in place.  The patient's abdomen was clipped, prepped, & draped in a sterile fashion.  Surgical timeout confirmed our plan.  The patient was positioned in reverse Trendelenburg.  Abdominal entry was gained using a Varies needle in the LUQ.  Entry was clean.  I induced carbon dioxide insufflation.  An 52mm robotic port was placed in the RUQ.  Camera inspection revealed no injury.  Extra ports were carefully placed under direct laparoscopic visualization.  I laparoscopically reflected the greater omentum and the upper abdomen the small bowel in the upper abdomen. The patient was appropriately positioned and the robot was docked to the patient's left side.  Instruments were placed under direct visualization.   I freed the omental adhesions from the abdominal wall using robotic scissors.  2 loops of small bowel were removed from the pelvis using scissors to lyse adhesions.  These were inspected and felt to be atraumatic and placed in the right lower quadrant.  Identified the rectal stump.  There appeared to be a small amount of sigmoid colon left.  I dissected out the mesenteric plane to the level of the rectosigmoid junction using the robotic scissors and electrocautery.  I then undocked the robot and took down the ostomy in the left upper quadrant using Bovie electrocautery and blunt dissection.  The end of the colostomy was resected over a pursestring device.  A 2 0 Prolene pursestring suture was placed.  This was tied tightly  around a 29 mm EEA anvil.  This was then placed back into the pelvis and the robot was redocked.  I then mobilized the colon by dividing the white line of Toldt to the level of the splenic flexure.  This allowed for mobilization into the pelvis.  Once this was complete an EEA stapler was  inserted into the rectal stump.  An anastomosis was created through the anterior distal rectum.  Additional omentum was resected away from the colon to allow for the colon anastomosis to be under no tension.  Once this was complete, the colon was insufflated under irrigation.  There was no leak noted.  Hemostasis was good.  The pelvis was irrigated.  The rectosigmoid stump was removed and the robot was undocked.  We then switched to clean gowns, gloves, instruments and drapes.    The fascia of the colostomy site was closed in a vertical fashion using interrupted #1 Novafil sutures.  Subcutaneous tissue was reapproximated using a running 2 0 pursestring suture.  The dermal layer was reapproximated with a 2-0 Vicryl pursestring suture as well.  A Telfa wick was placed into the middle of the wound and a dressing was applied.  The remaining port sites was closed using a 4-0 Vicryl suture and Dermabond.  Patient tolerated this well and sent to the postanesthesia care unit in stable condition.  All counts were correct per operating room staff.  An MD assistant was necessary for tissue manipulation, retraction and positioning due to the complexity of the case and hospital policies

## 2019-10-19 NOTE — Anesthesia Preprocedure Evaluation (Addendum)
Anesthesia Evaluation  Patient identified by MRN, date of birth, ID band Patient awake    Reviewed: Allergy & Precautions, NPO status , Patient's Chart, lab work & pertinent test results  History of Anesthesia Complications (+) PONV and history of anesthetic complications  Airway Mallampati: II  TM Distance: >3 FB Neck ROM: Full    Dental no notable dental hx. (+) Teeth Intact, Dental Advisory Given   Pulmonary former smoker,  30 years ago   Pulmonary exam normal breath sounds clear to auscultation       Cardiovascular negative cardio ROS Normal cardiovascular exam Rhythm:Regular Rate:Normal     Neuro/Psych  Headaches, PSYCHIATRIC DISORDERS Anxiety    GI/Hepatic Neg liver ROS, GERD  Medicated and Controlled,SOB with perforated sigmoid colon s/p resection and colostomy 04/18/2019, now here for reversal   Endo/Other  negative endocrine ROS  Renal/GU negative Renal ROS  negative genitourinary   Musculoskeletal  (+) Arthritis , Osteoarthritis,    Abdominal Normal abdominal exam  (+)   Peds negative pediatric ROS (+)  Hematology negative hematology ROS (+)   Anesthesia Other Findings   Reproductive/Obstetrics negative OB ROS                            Anesthesia Physical Anesthesia Plan  ASA: II  Anesthesia Plan: General   Post-op Pain Management:    Induction: Intravenous  PONV Risk Score and Plan: 4 or greater and Ondansetron, Dexamethasone, Midazolam, Scopolamine patch - Pre-op, Diphenhydramine, Metaclopromide and Treatment may vary due to age or medical condition  Airway Management Planned: Oral ETT  Additional Equipment: None  Intra-op Plan:   Post-operative Plan: Extubation in OR  Informed Consent: I have reviewed the patients History and Physical, chart, labs and discussed the procedure including the risks, benefits and alternatives for the proposed anesthesia with the  patient or authorized representative who has indicated his/her understanding and acceptance.     Dental advisory given  Plan Discussed with: CRNA  Anesthesia Plan Comments:         Anesthesia Quick Evaluation

## 2019-10-19 NOTE — Transfer of Care (Signed)
Immediate Anesthesia Transfer of Care Note  Patient: MARCELLINE TEMKIN  Procedure(s) Performed: XI ROBOTIC ASSISTED COLOSTOMY REVERSAL (N/A )  Patient Location: PACU  Anesthesia Type:General  Level of Consciousness: sedated  Airway & Oxygen Therapy: Patient Spontanous Breathing and Patient connected to face mask oxygen  Post-op Assessment: Report given to RN and Post -op Vital signs reviewed and stable  Post vital signs: Reviewed and stable  Last Vitals:  Vitals Value Taken Time  BP 134/81 10/19/19 1055  Temp    Pulse 81 10/19/19 1056  Resp 19 10/19/19 1056  SpO2 100 % 10/19/19 1056  Vitals shown include unvalidated device data.  Last Pain:  Vitals:   10/19/19 0620  PainSc: 6       Patients Stated Pain Goal: 5 (44/81/85 6314)  Complications: No apparent anesthesia complications

## 2019-10-20 DIAGNOSIS — G43909 Migraine, unspecified, not intractable, without status migrainosus: Secondary | ICD-10-CM

## 2019-10-20 DIAGNOSIS — F411 Generalized anxiety disorder: Secondary | ICD-10-CM

## 2019-10-20 LAB — CBC
HCT: 33.3 % — ABNORMAL LOW (ref 36.0–46.0)
Hemoglobin: 10.7 g/dL — ABNORMAL LOW (ref 12.0–15.0)
MCH: 30.7 pg (ref 26.0–34.0)
MCHC: 32.1 g/dL (ref 30.0–36.0)
MCV: 95.4 fL (ref 80.0–100.0)
Platelets: 211 10*3/uL (ref 150–400)
RBC: 3.49 MIL/uL — ABNORMAL LOW (ref 3.87–5.11)
RDW: 14.1 % (ref 11.5–15.5)
WBC: 8 10*3/uL (ref 4.0–10.5)
nRBC: 0 % (ref 0.0–0.2)

## 2019-10-20 LAB — BASIC METABOLIC PANEL
Anion gap: 8 (ref 5–15)
BUN: 6 mg/dL (ref 6–20)
CO2: 26 mmol/L (ref 22–32)
Calcium: 8.7 mg/dL — ABNORMAL LOW (ref 8.9–10.3)
Chloride: 105 mmol/L (ref 98–111)
Creatinine, Ser: 0.59 mg/dL (ref 0.44–1.00)
GFR calc Af Amer: >60 mL/min (ref >60)
GFR calc non Af Amer: >60 mL/min (ref >60)
Glucose, Bld: 99 mg/dL (ref 70–99)
Potassium: 3.3 mmol/L — ABNORMAL LOW (ref 3.5–5.1)
Sodium: 139 mmol/L (ref 135–145)

## 2019-10-20 MED ORDER — GUAIFENESIN-DM 100-10 MG/5ML PO SYRP: 10.0000 mL | ORAL_SOLUTION | ORAL | Status: DC | PRN

## 2019-10-20 MED ORDER — SIMETHICONE 40 MG/0.6ML PO SUSP
40.0000 mg | Freq: Four times a day (QID) | ORAL | Status: DC | PRN
  Filled 2019-10-20: qty 0.6

## 2019-10-20 MED ORDER — PSYLLIUM 95 % PO PACK
1.0000 | PACK | Freq: Every day | ORAL | Status: DC
  Administered 2019-10-20 – 2019-10-22 (×3): 1 via ORAL
  Filled 2019-10-20 (×3): qty 1

## 2019-10-20 MED ORDER — MAGIC MOUTHWASH
15.0000 mL | Freq: Four times a day (QID) | ORAL | Status: DC | PRN
  Filled 2019-10-20: qty 15

## 2019-10-20 MED ORDER — HYDROCORTISONE 1 % EX CREA: 1.0000 "application " | TOPICAL_CREAM | Freq: Three times a day (TID) | CUTANEOUS | Status: DC | PRN

## 2019-10-20 MED ORDER — SODIUM CHLORIDE 0.9% FLUSH
3.0000 mL | Freq: Two times a day (BID) | INTRAVENOUS | Status: DC
  Administered 2019-10-20 – 2019-10-21 (×3): 3 mL via INTRAVENOUS

## 2019-10-20 MED ORDER — LIP MEDEX EX OINT
1.0000 "application " | TOPICAL_OINTMENT | Freq: Two times a day (BID) | CUTANEOUS | Status: DC
  Administered 2019-10-21: 1 via TOPICAL
  Filled 2019-10-20: qty 7

## 2019-10-20 MED ORDER — SODIUM CHLORIDE 0.9% FLUSH: 3.0000 mL | INTRAVENOUS | Status: DC | PRN

## 2019-10-20 MED ORDER — MENTHOL 3 MG MT LOZG: 1.0000 | LOZENGE | OROMUCOSAL | Status: DC | PRN

## 2019-10-20 MED ORDER — PHENOL 1.4 % MT LIQD: 2.0000 | OROMUCOSAL | Status: DC | PRN

## 2019-10-20 MED ORDER — SODIUM CHLORIDE 0.9 % IV SOLN: 250.0000 mL | INTRAVENOUS | Status: DC | PRN

## 2019-10-20 MED ORDER — OXYCODONE HCL 5 MG PO TABS
5.0000 mg | ORAL_TABLET | ORAL | Status: DC | PRN
  Administered 2019-10-20: 10 mg via ORAL
  Administered 2019-10-20: 5 mg via ORAL
  Administered 2019-10-20 – 2019-10-22 (×10): 10 mg via ORAL
  Filled 2019-10-20: qty 2
  Filled 2019-10-20: qty 1
  Filled 2019-10-20 (×10): qty 2

## 2019-10-20 MED ORDER — HYDROCORTISONE (PERIANAL) 2.5 % EX CREA: 1.0000 "application " | TOPICAL_CREAM | Freq: Four times a day (QID) | CUTANEOUS | Status: DC | PRN

## 2019-10-20 MED ORDER — METHOCARBAMOL 1000 MG/10ML IJ SOLN
1000.0000 mg | Freq: Four times a day (QID) | INTRAVENOUS | Status: DC | PRN
  Administered 2019-10-21: 1000 mg via INTRAVENOUS
  Filled 2019-10-20 (×2): qty 10

## 2019-10-20 MED ORDER — LACTATED RINGERS IV BOLUS: 1000.0000 mL | Freq: Three times a day (TID) | INTRAVENOUS | Status: AC | PRN

## 2019-10-20 MED ORDER — GABAPENTIN 400 MG PO CAPS
400.0000 mg | ORAL_CAPSULE | Freq: Three times a day (TID) | ORAL | Status: DC
  Administered 2019-10-20 – 2019-10-22 (×7): 400 mg via ORAL
  Filled 2019-10-20 (×7): qty 1

## 2019-10-20 NOTE — Progress Notes (Signed)
Molly Beck [id number] [birth year]  CARE TEAM:  PCP: Maude Leriche, [name]  Outpatient Care Team: Patient Care Team: Scifres, Durel Salts as PCP - General (Physician Assistant)  Inpatient Treatment Team: Treatment Team: Attending Provider: Leighton Ruff, MD; Technician: Burman Blacksmith, NT; Technician: Aida Raider, NT   Problem List:   Active Problems:   Colostomy status ([place])   1 Day Post-Op  10/19/2019  POST-OPERATIVE DIAGNOSIS:  COLOSTOMY, DIVERTICULAR DISEASE  PROCEDURE:  XI ROBOTIC ASSISTED COLOSTOMY REVERSAL   Surgeon(s):  Leighton Ruff, MD  Assessment  Recovering  [facility] Stay = 1 days)  Plan:  -Eras protocol.  Advance diet gradually.  Improved pain control.  Switch from tramadol oxycodone.  Increase gabapentin.  Continue standing Tylenol.  Ice/heat.  Get her up and mobilize more.  Anxiolysis.  -VTE prophylaxis- SCDs, etc  -mobilize as tolerated to help recovery  20 minutes spent in review, evaluation, examination, counseling, and coordination of care.  More than 50% of that time was spent in counseling.  10/20/2019    Subjective: (Chief complaint)  Sore.  Tramadol not helping enough.  Nurse in room.  Tolerating liquids.  No nausea or vomiting.  Objective:  Vital signs:  Vitals:   10/19/19 1413 10/19/19 1520 10/19/19 2107 10/20/19 0504  BP: 98/67 109/78 104/72 107/71  Pulse: 64 70 68 68  Resp: 18 16 16    Temp:   98.1 F (36.7 C) 98.3 F (36.8 C)  TempSrc:   Oral Oral  SpO2: 99% 95% 98% 98%  Weight:      Height:        Last BM Date: 10/19/19  Intake/Output   Yesterday:  12/18 0701 - 12/19 0700 In: 3235.9 [P.O.:420; I.V.:2715.9; IV Piggyback:100] Out: 2995 [Urine:2975; Blood:20] This shift:  No intake/output data recorded.  Bowel function:  Flatus: YES  BM:  No  Drain: (No drain)   Physical Exam:  General: Pt awake/alert/oriented x4 in no acute distress Eyes: PERRL, normal EOM.   Sclera clear.  No icterus Neuro: CN II-XII intact w/o focal sensory/motor deficits. Lymph: No head/neck/groin lymphadenopathy Psych:  No delerium/psychosis/paranoia HENT: Normocephalic, Mucus membranes moist.  No thrush Neck: Supple, No tracheal deviation Chest: No chest wall pain w good excursion CV:  Pulses intact.  Regular rhythm MS: Normal AROM mjr joints.  No obvious deformity  Abdomen: Soft.  Mildy distended.  Mildly tender at incisions only.  No evidence of peritonitis.  No incarcerated hernias.  Ext:  No deformity.  No mjr edema.  No cyanosis Skin: No petechiae / purpura  Results:   Cultures: Recent Results (from the past 720 hour(s))  Novel Coronavirus, NAA (Hosp order, Send-out to Ref Lab; TAT 18-24 hrs     Status: None   Collection Time: 10/16/19  9:07 AM   Specimen: Nasopharyngeal Swab; Respiratory  Result Value Ref Range Status   SARS-CoV-2, NAA NOT DETECTED NOT DETECTED Final    Comment: (NOTE) Testing was performed using the cobas(R) SARS-CoV-2 test. This nucleic acid amplification test was developed and its performance characteristics determined by [employer]. Nucleic acid amplification tests include PCR and TMA. This test has not been FDA cleared or approved. This test has been authorized by FDA under an Emergency Use Authorization (EUA). This test is only authorized for the duration of time the declaration that circumstances exist justifying the authorization of the emergency use of in vitro diagnostic tests for detection of SARS-CoV-2 virus and/or diagnosis of COVID-19 infection under section 564(b)(1) of the Act, 21 U.S.C.  360bbb-3(b) (1), unless the authorization is terminated or revoked sooner. When diagnostic testing is negative, the possibility of a false negative result should be considered in the context of a patient's recent exposures and the presence of clinical signs and symptoms consistent with COVID-19. An individual without s ymptoms  of COVID- 19 and who is not shedding SARS-CoV-2 virus would expect to have a negative (not detected) result in this assay. Performed At: [facility] [address] [place], [place] [id number] Jolene Schimke MD GB:[id number]    Coronavirus Source NASOPHARYNGEAL  Final    Comment: Performed at [facility], [address], [place], [place] [address]    Labs: Results for orders placed or performed during the hospital encounter of 10/19/19 (from the past 48 hour(s))  CBC     Status: Abnormal   Collection Time: 10/20/19  4:58 AM  Result Value Ref Range   WBC 8.0 4.0 - 10.5 K/uL   RBC 3.49 (L) 3.87 - 5.11 MIL/uL   Hemoglobin 10.7 (L) 12.0 - 15.0 g/dL   HCT 69.4 (L) 85.4 - 62.7 %   MCV 95.4 80.0 - 100.0 fL   MCH 30.7 26.0 - 34.0 pg   MCHC 32.1 30.0 - 36.0 g/dL   RDW 03.5 00.9 - 38.1 %   Platelets 211 150 - 400 K/uL   nRBC 0.0 0.0 - 0.2 %    Comment: Performed at [facility], [address], [place], [place] [address]  Basic metabolic panel     Status: Abnormal   Collection Time: 10/20/19  4:58 AM  Result Value Ref Range   Sodium 139 135 - 145 mmol/L   Potassium 3.3 (L) 3.5 - 5.1 mmol/L   Chloride 105 98 - 111 mmol/L   CO2 26 22 - 32 mmol/L   Glucose, Bld 99 70 - 99 mg/dL   BUN 6 6 - 20 mg/dL   Creatinine, Ser 7.16 0.44 - 1.00 mg/dL   Calcium 8.7 (L) 8.9 - 10.3 mg/dL   GFR calc non Af Amer >60 >60 mL/min   GFR calc Af Amer >60 >60 mL/min   Anion gap 8 5 - 15    Comment: Performed at [facility], [address] [place], [place] [address]    Imaging / Studies: No results found.  Medications / Allergies: per chart  Antibiotics: Anti-infectives (From admission, onward)   Start     Dose/Rate Route Frequency Ordered Stop   10/19/19 0600  cefoTEtan (CEFOTAN) 2 g in sodium chloride 0.9 % 100 mL IVPB     2 g 200 mL/hr over 30 Minutes Intravenous On call to O.R. 10/19/19 0537 10/19/19 0734        Note: Portions of this  report may have been transcribed using voice recognition software. Every effort was made to ensure accuracy; however, inadvertent computerized transcription errors may be present.   Any transcriptional errors that result from this process are unintentional.     Ardeth Sportsman, MD, FACS, MASCRS Gastrointestinal and Minimally Invasive Surgery    [address], [address] [place], [place] [address] [phone] Main / Paging [phone] Fax

## 2019-10-21 DIAGNOSIS — K5792 Diverticulitis of intestine, part unspecified, without perforation or abscess without bleeding: Secondary | ICD-10-CM

## 2019-10-21 LAB — CBC
HCT: 32.1 % — ABNORMAL LOW (ref 36.0–46.0)
Hemoglobin: 10.5 g/dL — ABNORMAL LOW (ref 12.0–15.0)
MCH: 31.4 pg (ref 26.0–34.0)
MCHC: 32.7 g/dL (ref 30.0–36.0)
MCV: 96.1 fL (ref 80.0–100.0)
Platelets: 200 10*3/uL (ref 150–400)
RBC: 3.34 MIL/uL — ABNORMAL LOW (ref 3.87–5.11)
RDW: 14.2 % (ref 11.5–15.5)
WBC: 7.2 10*3/uL (ref 4.0–10.5)
nRBC: 0 % (ref 0.0–0.2)

## 2019-10-21 LAB — BASIC METABOLIC PANEL
Anion gap: 8 (ref 5–15)
BUN: 7 mg/dL (ref 6–20)
CO2: 27 mmol/L (ref 22–32)
Calcium: 8.4 mg/dL — ABNORMAL LOW (ref 8.9–10.3)
Chloride: 106 mmol/L (ref 98–111)
Creatinine, Ser: 0.53 mg/dL (ref 0.44–1.00)
GFR calc Af Amer: >60 mL/min (ref >60)
GFR calc non Af Amer: >60 mL/min (ref >60)
Glucose, Bld: 89 mg/dL (ref 70–99)
Potassium: 3.4 mmol/L — ABNORMAL LOW (ref 3.5–5.1)
Sodium: 141 mmol/L (ref 135–145)

## 2019-10-21 MED ORDER — METHOCARBAMOL 500 MG PO TABS
1000.0000 mg | ORAL_TABLET | Freq: Four times a day (QID) | ORAL | Status: DC | PRN
  Administered 2019-10-21: 1000 mg via ORAL
  Filled 2019-10-21: qty 2

## 2019-10-21 MED ORDER — SODIUM CHLORIDE 0.9 % IV SOLN: INTRAVENOUS | Status: DC | PRN

## 2019-10-21 NOTE — Progress Notes (Signed)
Molly Beck [id number] 11-03-1965  CARE TEAM:  PCP: Maude Leriche, [name]  Outpatient Care Team: Patient Care Team: Scifres, Durel Salts as PCP - General (Physician Assistant)  Inpatient Treatment Team: Treatment Team: Attending Provider: Leighton Ruff, MD; Technician: Burman Blacksmith, NT; Technician: Leda Quail, NT   Problem List:   Principal Problem:   Perforation of sigmoid colon [facility]) Active Problems:   Anxiety state   Migraine   2 Days Post-Op  10/19/2019  POST-OPERATIVE DIAGNOSIS:  COLOSTOMY, DIVERTICULAR DISEASE  PROCEDURE:  XI ROBOTIC ASSISTED COLOSTOMY REVERSAL   Surgeon(s):  Leighton Ruff, MD  Assessment  Recovering  [facility] Stay = 2 days)  Plan:  -Eras protocol.  Advance diet to solids.  Improved pain control with oxycodone.  Continue standing Tylenol/gabaentin.  Ice/heat.  Get her up and mobilize more.  Anxiolysis.  -VTE prophylaxis- SCDs, etc  -mobilize as tolerated to help recovery  20 minutes spent in review, evaluation, examination, counseling, and coordination of care.  More than 50% of that time was spent in counseling.  10/21/2019    Subjective: (Chief complaint)  Sore.  Tylenol/gabapentin/oxy working better  Nurse in room.  Tolerating Dys1 diet/liquids.  Didn't want to advance yesterday  Objective:  Vital signs:  Vitals:   10/20/19 1332 10/20/19 2107 10/21/19 0545 10/21/19 0613  BP: 107/74 109/70 121/82   Pulse: 69 74 81   Resp: 16 18 16    Temp:  99.1 F (37.3 C) 98.5 F (36.9 C)   TempSrc:  Oral Oral   SpO2: 99% 97% 96%   Weight:    76 kg  Height:        Last BM Date: 10/20/19  Intake/Output   Yesterday:  12/19 0701 - 12/20 0700 In: 240 [P.O.:240] Out: 850 [Urine:850] This shift:  No intake/output data recorded.  Bowel function:  Flatus: YES  BM:  YES  Drain: (No drain)   Physical Exam:  General: Pt awake/alert/oriented x4 in no acute distress Eyes: PERRL, normal  EOM.  Sclera clear.  No icterus Neuro: CN II-XII intact w/o focal sensory/motor deficits. Lymph: No head/neck/groin lymphadenopathy Psych:  No delerium/psychosis/paranoia HENT: Normocephalic, Mucus membranes moist.  No thrush Neck: Supple, No tracheal deviation Chest: No chest wall pain w good excursion CV:  Pulses intact.  Regular rhythm MS: Normal AROM mjr joints.  No obvious deformity  Abdomen: Soft.  Nondistended.  Mildly tender at incisions only.  No evidence of peritonitis.  No incarcerated hernias.  Ext:  No deformity.  No mjr edema.  No cyanosis Skin: No petechiae / purpura  Results:   Cultures: Recent Results (from the past 720 hour(s))  Novel Coronavirus, NAA (Hosp order, Send-out to Ref Lab; TAT 18-24 hrs     Status: None   Collection Time: 10/16/19  9:07 AM   Specimen: Nasopharyngeal Swab; Respiratory  Result Value Ref Range Status   SARS-CoV-2, NAA NOT DETECTED NOT DETECTED Final    Comment: (NOTE) Testing was performed using the cobas(R) SARS-CoV-2 test. This nucleic acid amplification test was developed and its performance characteristics determined by [employer]. Nucleic acid amplification tests include PCR and TMA. This test has not been FDA cleared or approved. This test has been authorized by FDA under an Emergency Use Authorization (EUA). This test is only authorized for the duration of time the declaration that circumstances exist justifying the authorization of the emergency use of in vitro diagnostic tests for detection of SARS-CoV-2 virus and/or diagnosis of COVID-19 infection under section 564(b)(1) of the Act,  21 U.S.C. 360bbb-3(b) (1), unless the authorization is terminated or revoked sooner. When diagnostic testing is negative, the possibility of a false negative result should be considered in the context of a patient's recent exposures and the presence of clinical signs and symptoms consistent with COVID-19. An individual without s  ymptoms of COVID- 19 and who is not shedding SARS-CoV-2 virus would expect to have a negative (not detected) result in this assay. Performed At: [facility] [address] [place], [place] [id number] Jolene SchimkeNagendra Sanjai MD WU:[id number]:[phone]    Coronavirus Source NASOPHARYNGEAL  Final    Comment: Performed at [facility], [address] [place], [place] [address]    Labs: Results for orders placed or performed during the hospital encounter of 10/19/19 (from the past 48 hour(s))  CBC     Status: Abnormal   Collection Time: 10/20/19  4:58 AM  Result Value Ref Range   WBC 8.0 4.0 - 10.5 K/uL   RBC 3.49 (L) 3.87 - 5.11 MIL/uL   Hemoglobin 10.7 (L) 12.0 - 15.0 g/dL   HCT 30.833.3 (L) 65.736.0 - 84.646.0 %   MCV 95.4 80.0 - 100.0 fL   MCH 30.7 26.0 - 34.0 pg   MCHC 32.1 30.0 - 36.0 g/dL   RDW 96.214.1 95.211.5 - 84.115.5 %   Platelets 211 150 - 400 K/uL   nRBC 0.0 0.0 - 0.2 %    Comment: Performed at [facility], [address] [place], [place] [address]  Basic metabolic panel     Status: Abnormal   Collection Time: 10/20/19  4:58 AM  Result Value Ref Range   Sodium 139 135 - 145 mmol/L   Potassium 3.3 (L) 3.5 - 5.1 mmol/L   Chloride 105 98 - 111 mmol/L   CO2 26 22 - 32 mmol/L   Glucose, Bld 99 70 - 99 mg/dL   BUN 6 6 - 20 mg/dL   Creatinine, Ser 1.020.59 0.44 - 1.00 mg/dL   Calcium 8.7 (L) 8.9 - 10.3 mg/dL   GFR calc non Af Amer >60 >60 mL/min   GFR calc Af Amer >60 >60 mL/min   Anion gap 8 5 - 15    Comment: Performed at [facility], [address] [place], [place] [address]  CBC     Status: Abnormal   Collection Time: 10/21/19  4:31 AM  Result Value Ref Range   WBC 7.2 4.0 - 10.5 K/uL   RBC 3.34 (L) 3.87 - 5.11 MIL/uL   Hemoglobin 10.5 (L) 12.0 - 15.0 g/dL   HCT 64.432.1 (L) 03.436.0 - 74.246.0 %   MCV 96.1 80.0 - 100.0 fL   MCH 31.4 26.0 - 34.0 pg   MCHC 32.7 30.0 - 36.0 g/dL   RDW 59.514.2 63.811.5 - 75.615.5 %   Platelets 200 150 - 400 K/uL   nRBC 0.0 0.0 - 0.2 %     Comment: Performed at [facility], [address] [place], [place] [address]  Basic metabolic panel     Status: Abnormal   Collection Time: 10/21/19  4:31 AM  Result Value Ref Range   Sodium 141 135 - 145 mmol/L   Potassium 3.4 (L) 3.5 - 5.1 mmol/L   Chloride 106 98 - 111 mmol/L   CO2 27 22 - 32 mmol/L   Glucose, Bld 89 70 - 99 mg/dL   BUN 7 6 - 20 mg/dL   Creatinine, Ser 5.180.53 0.44 - 1.00 mg/dL   Calcium 8.4 (L)  8.9 - 10.3 mg/dL   GFR calc non Af Amer >60 >60 mL/min   GFR calc Af Amer >60 >60 mL/min   Anion gap 8 5 - 15    Comment: Performed at [facility], [address] [place], [place] [address]    Imaging / Studies: No results found.  Medications / Allergies: per chart  Antibiotics: Anti-infectives (From admission, onward)   Start     Dose/Rate Route Frequency Ordered Stop   10/19/19 0600  cefoTEtan (CEFOTAN) 2 g in sodium chloride 0.9 % 100 mL IVPB     2 g 200 mL/hr over 30 Minutes Intravenous On call to O.R. 10/19/19 0537 10/19/19 0734        Note: Portions of this report may have been transcribed using voice recognition software. Every effort was made to ensure accuracy; however, inadvertent computerized transcription errors may be present.   Any transcriptional errors that result from this process are unintentional.     Ardeth Sportsman, MD, FACS, MASCRS Gastrointestinal and Minimally Invasive Surgery    [address] [place], [place] [address] [phone] Main / Paging [phone] Fax

## 2019-10-22 LAB — CBC
HCT: 33.1 % — ABNORMAL LOW (ref 36.0–46.0)
Hemoglobin: 10.7 g/dL — ABNORMAL LOW (ref 12.0–15.0)
MCH: 31.1 pg (ref 26.0–34.0)
MCHC: 32.3 g/dL (ref 30.0–36.0)
MCV: 96.2 fL (ref 80.0–100.0)
Platelets: 202 10*3/uL (ref 150–400)
RBC: 3.44 MIL/uL — ABNORMAL LOW (ref 3.87–5.11)
RDW: 13.9 % (ref 11.5–15.5)
WBC: 4.7 10*3/uL (ref 4.0–10.5)
nRBC: 0 % (ref 0.0–0.2)

## 2019-10-22 LAB — BASIC METABOLIC PANEL
Anion gap: 8 (ref 5–15)
BUN: 8 mg/dL (ref 6–20)
CO2: 28 mmol/L (ref 22–32)
Calcium: 8.8 mg/dL — ABNORMAL LOW (ref 8.9–10.3)
Chloride: 106 mmol/L (ref 98–111)
Creatinine, Ser: 0.57 mg/dL (ref 0.44–1.00)
GFR calc Af Amer: >60 mL/min (ref >60)
GFR calc non Af Amer: >60 mL/min (ref >60)
Glucose, Bld: 93 mg/dL (ref 70–99)
Potassium: 3.9 mmol/L (ref 3.5–5.1)
Sodium: 142 mmol/L (ref 135–145)

## 2019-10-22 LAB — SURGICAL PATHOLOGY

## 2019-10-22 MED ORDER — INFLUENZA VAC SPLIT QUAD 0.5 ML IM SUSY: 0.5000 mL | PREFILLED_SYRINGE | INTRAMUSCULAR | Status: DC

## 2019-10-22 MED ORDER — METHOCARBAMOL 500 MG PO TABS: 500.0000 mg | ORAL_TABLET | Freq: Four times a day (QID) | ORAL | 0 refills | Status: DC | PRN

## 2019-10-22 MED ORDER — OXYCODONE HCL 5 MG PO TABS: 5.0000 mg | ORAL_TABLET | Freq: Four times a day (QID) | ORAL | 0 refills | Status: AC | PRN

## 2019-10-22 MED ORDER — ASPIRIN 81 MG PO CHEW: 81.0000 mg | CHEWABLE_TABLET | Freq: Two times a day (BID) | ORAL | 0 refills | Status: AC

## 2019-10-22 NOTE — Plan of Care (Signed)
All goals met for discharge

## 2019-10-22 NOTE — Progress Notes (Signed)
Pt alert and oriented, tolerating diet. D/C instructions given. Pt d/cd to home. 

## 2019-10-22 NOTE — Discharge Summary (Signed)
Patient ID: Molly Beck [id number] 53 y.o. May 05, 1966  10/19/2019  Discharge date and time: No discharge date for patient encounter.  Admitting Physician: Rosario Adie  Discharge Physician: Rosario Adie  Admission Diagnoses: Colostomy status [facility]) [Z93.3]  Discharge Diagnoses: resolved  Operations: Procedure(s): XI ROBOTIC ASSISTED COLOSTOMY REVERSAL    Discharged Condition: good    Hospital Course: Pt was admitted after surgery.  Diet was advanced as tolerated.  Patient was discharged to home on POD 3 with good po pain control, tolerating a diet and having bowel function.    Consults: None  Significant Diagnostic Studies: labs: cbc, bmet  Treatments: IV hydration, analgesia: acetaminophen and oxycodone and surgery: robotic colostomy reversal  Disposition: Home

## 2020-04-27 IMAGING — CT CT IMAGE GUIDED DRAINAGE BY PERCUTANEOUS CATHETER
2 of 7 series · 12 of 32 positions shown, 17 images · non-contrast
Comparison: none

CLINICAL DATA: Sigmoid diverticulitis with rupture and abscesses in
the central upper pelvis and deep lower posterior pelvis.

[Series 2: i-spiral 5.0 b31f · axial · 0.95mm/px · z∈[-325,-129]mm · 8 of 72 slices shown, 13 images (1 of 2)]
[im 8/72  soft-tissue]
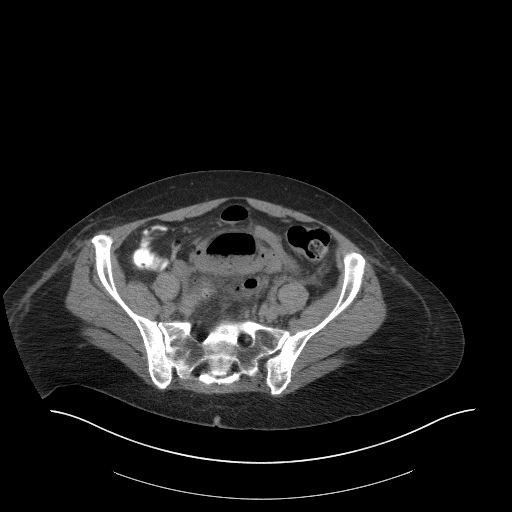
[im 8/72  bone]
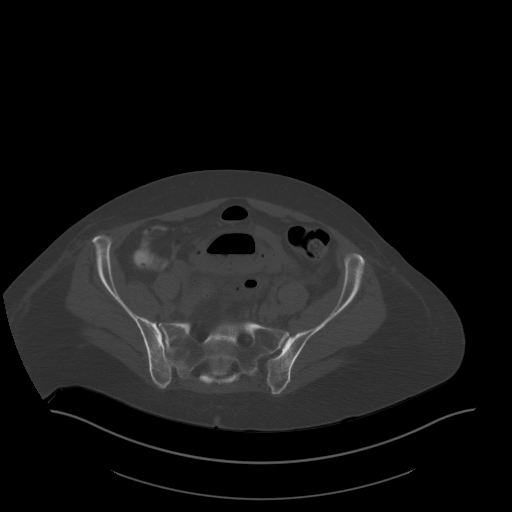
[im 16/72  soft-tissue]
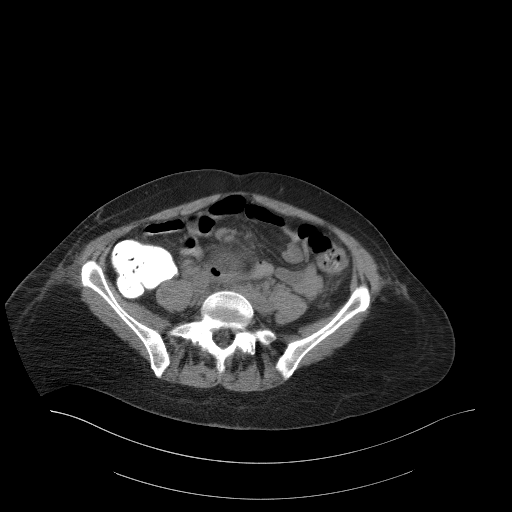
[im 24/72  soft-tissue]
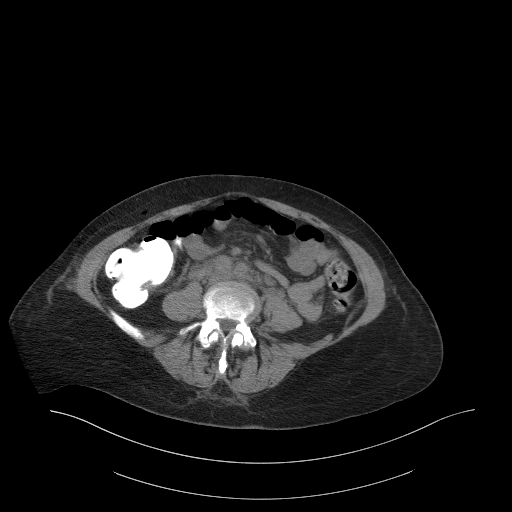
[im 32/72  soft-tissue]
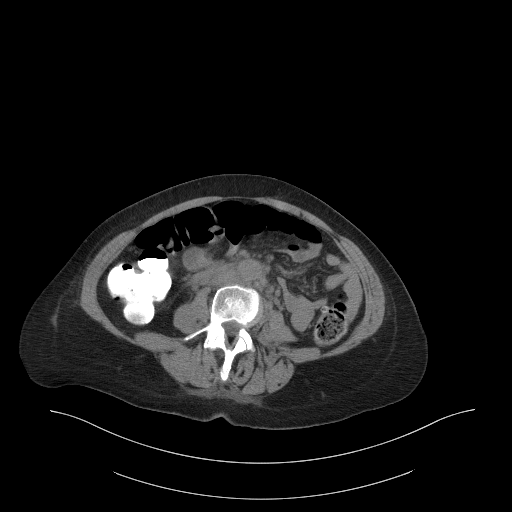
[im 40/72  soft-tissue]
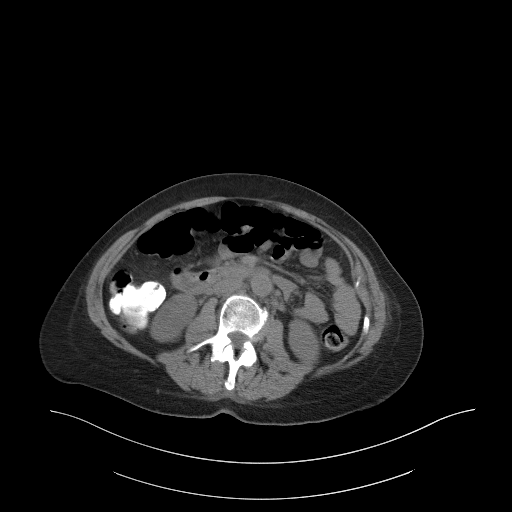
[im 40/72  lung]
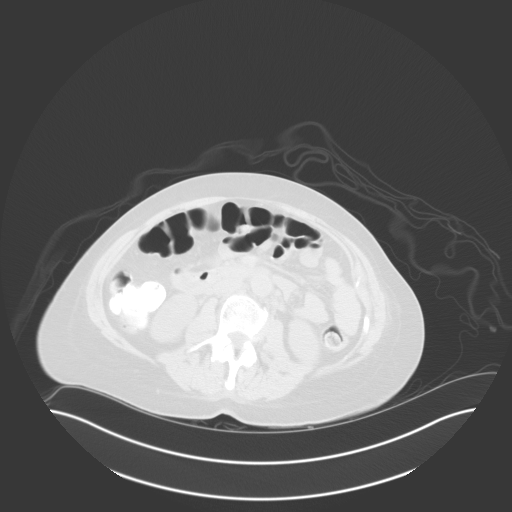
[im 48/72  soft-tissue]
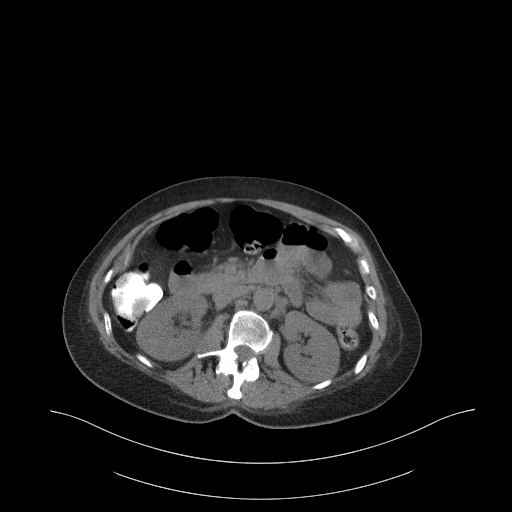
[im 48/72  lung]
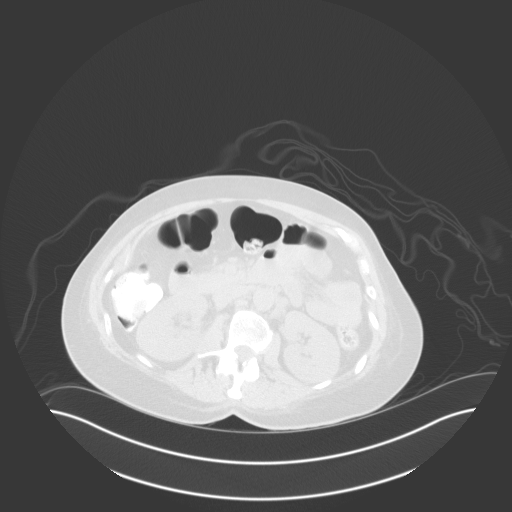
[im 56/72  soft-tissue]
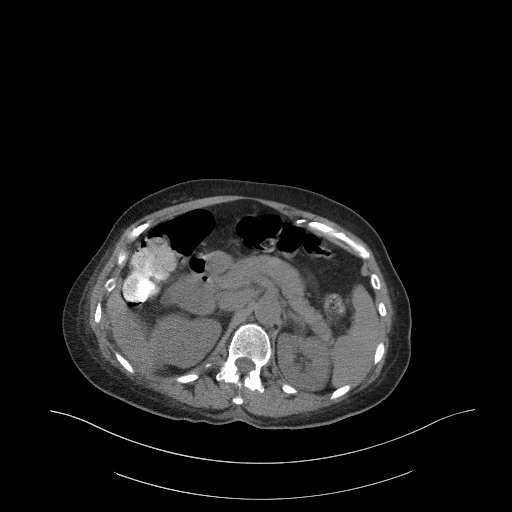
[im 56/72  lung]
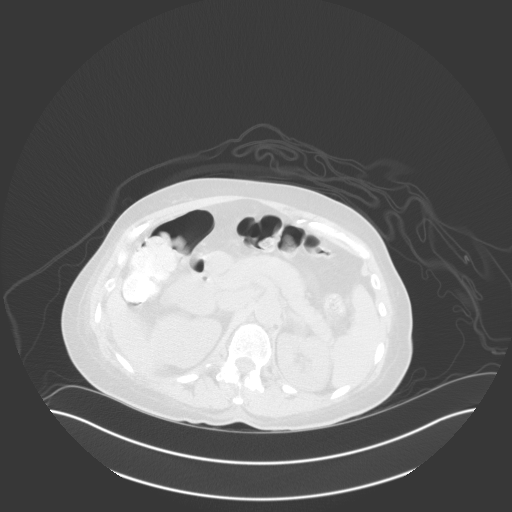
[im 64/72  soft-tissue]
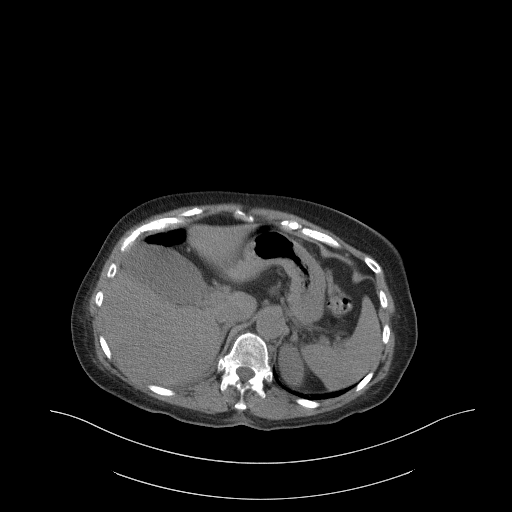
[im 64/72  lung]
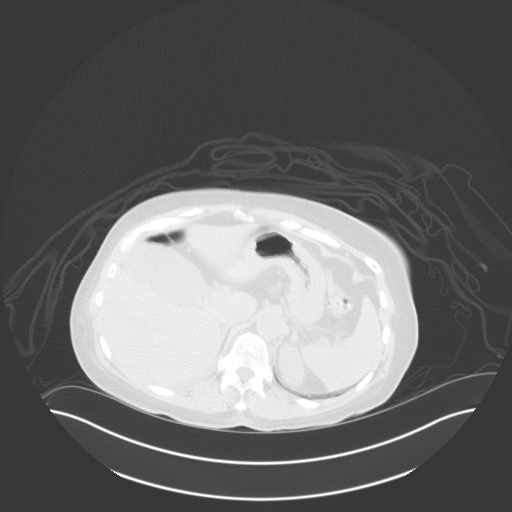

[Series 4: i-spiral 5.0 b31f · axial · 0.95mm/px · z∈[-406,-322]mm · 4 of 58 slices shown (2 of 2)]
[im 9/58  soft-tissue]
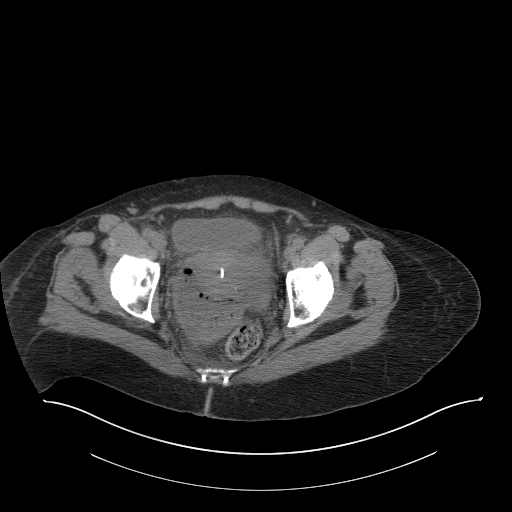
[im 17/58  soft-tissue]
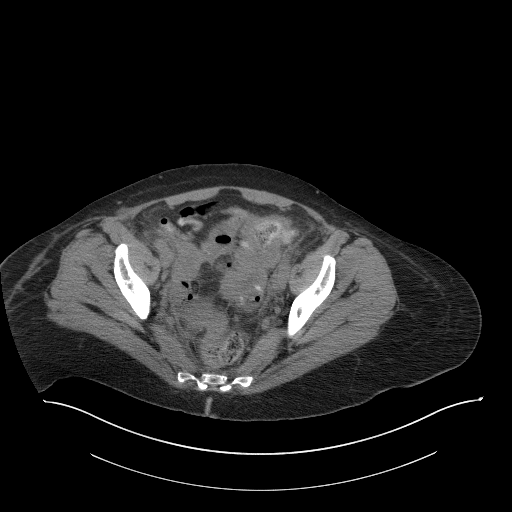
[im 25/58  soft-tissue]
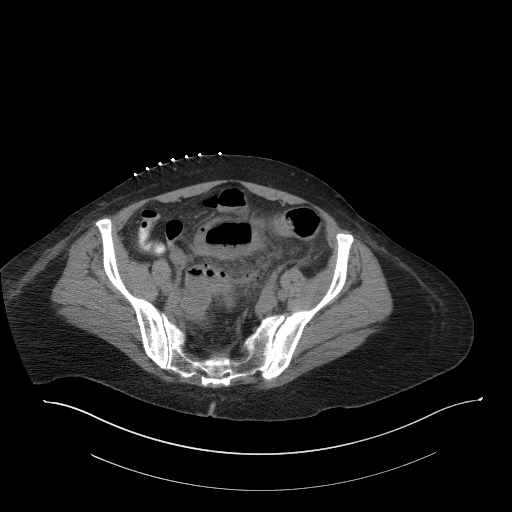
[im 33/58  soft-tissue]
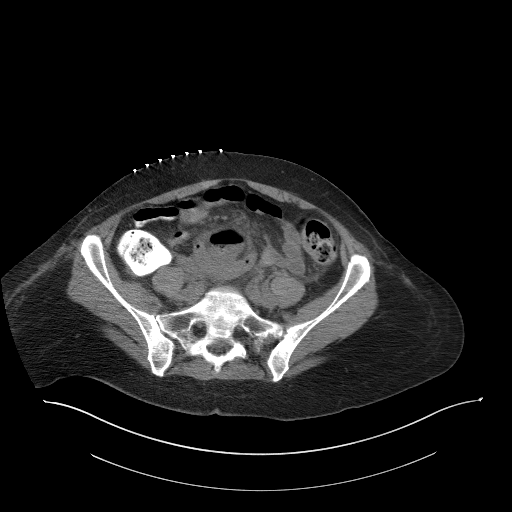

[12 of 32 positions shown; findings below may reference images not displayed]

EXAM:
1. CT GUIDED CATHETER DRAINAGE OF PERITONEAL DIVERTICULAR ABSCESS
2. CT-GUIDED CATHETER DRAINAGE OF PELVIC PERITONEAL ABSCESS

ANESTHESIA/SEDATION:
4.0 mg IV Versed 100 mcg IV Fentanyl

Total Moderate Sedation Time:  52 minutes

The patient's level of consciousness and physiologic status were
continuously monitored during the procedure by Radiology nursing.

PROCEDURE:
The procedure, risks, benefits, and alternatives were explained to
the patient. Questions regarding the procedure were encouraged and
answered. The patient understands and consents to the procedure. A
time out was performed prior to initiating the procedure.

The anterior abdominal wall followed by the right transgluteal
region were prepped with chlorhexidine in a sterile fashion, and a
sterile drape was applied covering the operative field. A sterile
gown and sterile gloves were used for the procedure. Local
anesthesia was provided with 1% Lidocaine.

An 18 gauge trocar needle was advanced from a right anterior
approach to the level of a sigmoid diverticular abscess. After
confirming needle tip position, aspiration was performed. A
guidewire was advanced and the needle removed. The tract was dilated
over the wire. A 10 French percutaneous drainage catheter was then
advanced over the wire and formed. Catheter position was confirmed
by CT. Additional fluid sample was aspirated from the drain. The
drainage catheter was then flushed with saline and attached to a
suction bulb. A Prolene retention suture was applied at the skin
exit site.

The patient was then placed in a prone position and from a right
transgluteal approach, an 18 gauge trocar needle was advanced to the
level a second posterior lower pelvic abscess abscess. After
confirming needle tip position, aspiration was performed. A
guidewire was advanced and the needle removed. The tract was dilated
over the wire. A 10 French percutaneous drainage catheter was then
advanced over the wire and formed. Catheter position was confirmed
by CT. Additional fluid sample was aspirated from the drain. The
drainage catheter was then flushed with saline and attached to a
suction bulb. A Prolene retention suture was applied at the skin
exit site.

COMPLICATIONS:
None
FINDINGS: There was an anterior approach available to the level of the central
lower abdominal sigmoid diverticular abscess. Aspiration yielded
feculent material. Additional aspiration via a drainage catheter
yielded purulent and feculent fluid. A 10 French drain was formed in
the abscess cavity.

Persistent posterior pelvic abscess was able to be accessed from a
right transgluteal approach. Aspiration yielded feculent and
purulent fluid. A 10 French drain was formed in the abscess.
IMPRESSION: CT-guided percutaneous drainage of 2 separate peritoneal abscess
collections related to ruptured sigmoid diverticulitis. A 10 French
drain was placed from an anterior approach into the sigmoid
diverticular abscess. A second 10 French drain was placed from a
posterior approach into the posterior deep pelvic abscess. Both
drains were attached to suction bulbs.

## 2020-05-03 IMAGING — DX PORTABLE ABDOMEN - 1 VIEW
1 series · 1 of 1 positions shown · non-contrast
Comparison: CT yesterday.

CLINICAL DATA: Small bowel obstruction. 8 hour delayed film.

EXAM:
PORTABLE ABDOMEN - 1 VIEW

[abdomen kub]
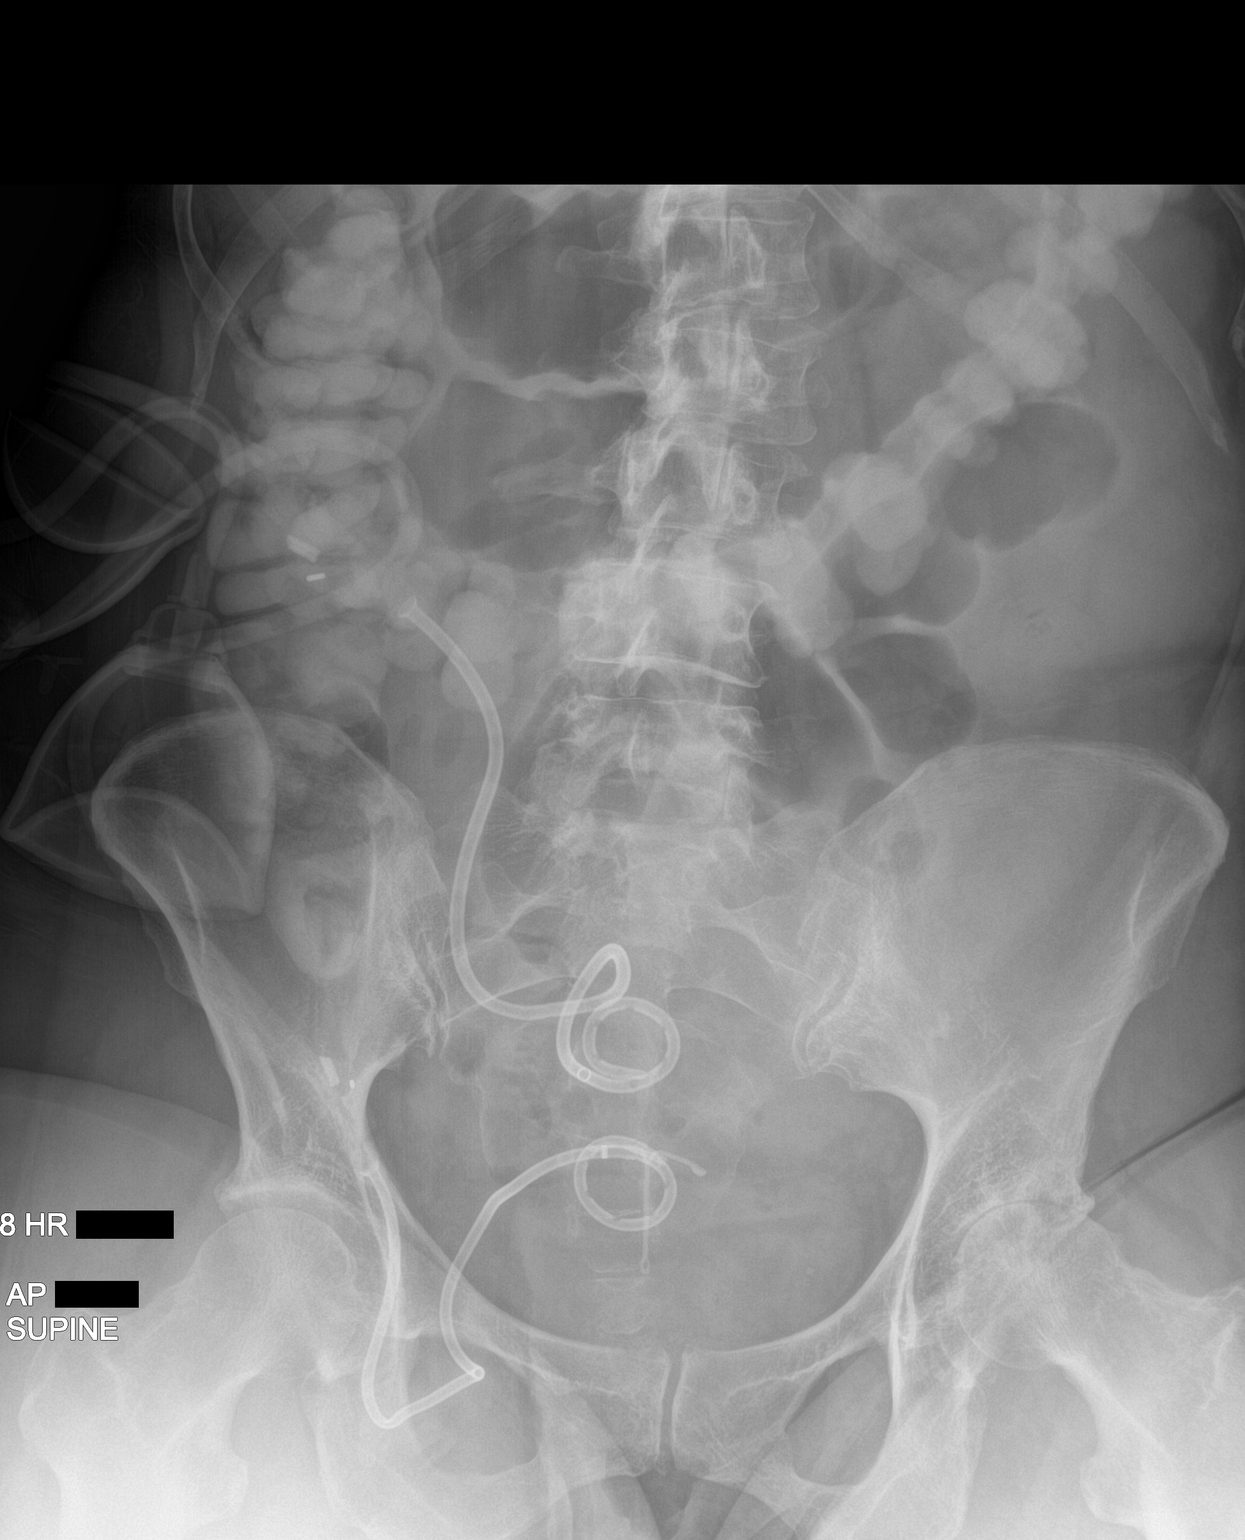

[1 of 1 positions shown; findings below may reference images not displayed]

FINDINGS: Enteric contrast in the colon, however unclear if this contrast was
from prior CT or administered for small bowel protocol. There is
residual contrast in the stomach. Persistent dilated small bowel in
the central abdomen a 5.1 cm. Two drainage catheters overlie the
pelvis. Advanced left hip osteoarthritis.
IMPRESSION: Enteric contrast in the colon, however it is unclear if this is
residual contrast that was seen on CT yesterday versus that
administered for the small-bowel protocol. There is residual
contrast in the stomach and persistent dilated small bowel in the
central abdomen. Recommend 24 hour delayed film.

## 2020-05-05 IMAGING — DX PORTABLE ABDOMEN - 1 VIEW
1 series · 1 of 1 positions shown · non-contrast
Comparison: April 17, 2019

CLINICAL DATA: Small bowel obstruction.

EXAM:
PORTABLE ABDOMEN - 1 VIEW

[abdomen kub]
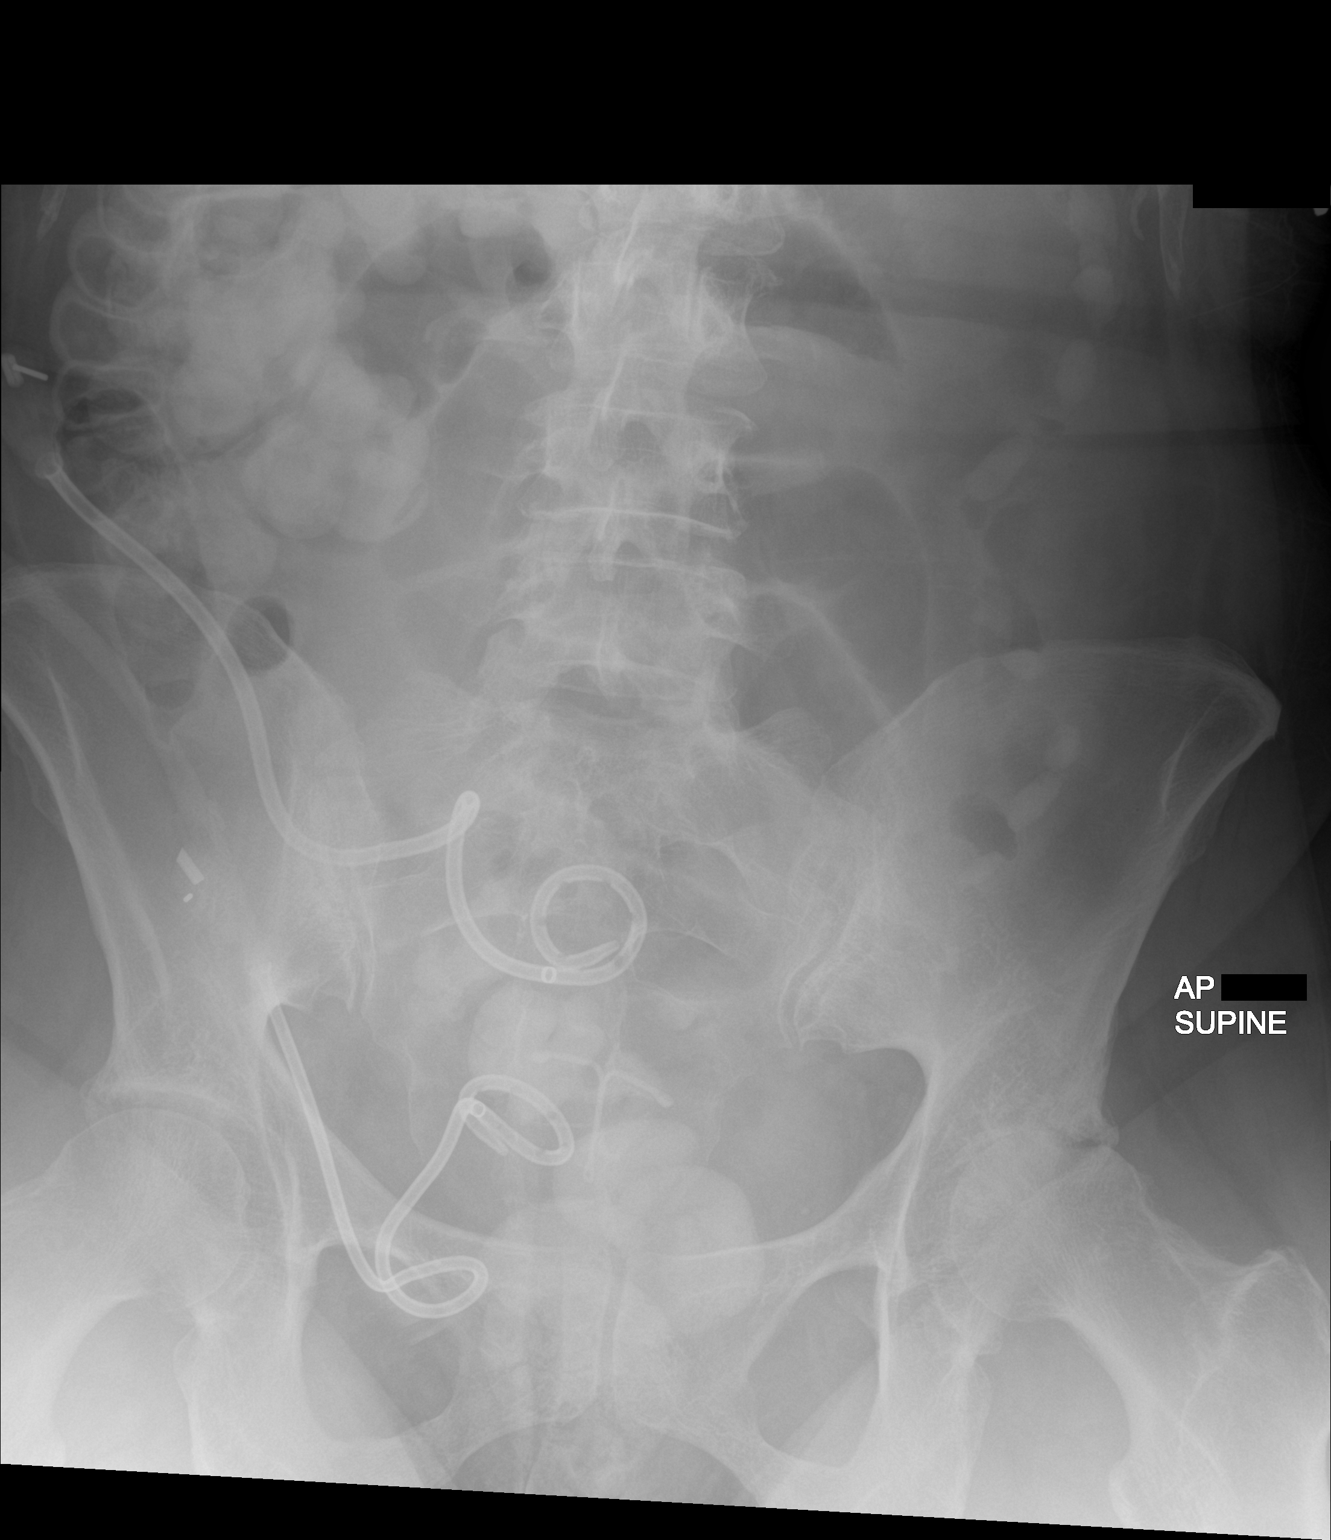

[1 of 1 positions shown; findings below may reference images not displayed]

FINDINGS: Two drains are seen in the pelvis, in stable position. Contrast is
seen in the colon, extending into the rectum. Few mildly prominent
loops of small bowel remain in the central abdomen measuring up to
3.3 cm. The more dilated loops superiorly is decreased in caliber.
No free air, portal venous gas, or pneumatosis.
IMPRESSION: Improving small bowel obstruction. Contrast in the colon now reaches
the rectum. Stable percutaneous drains in the pelvis.

## 2020-10-09 DIAGNOSIS — G894 Chronic pain syndrome: Secondary | ICD-10-CM | POA: Diagnosis not present

## 2020-10-09 DIAGNOSIS — M5412 Radiculopathy, cervical region: Secondary | ICD-10-CM | POA: Diagnosis not present

## 2020-10-09 DIAGNOSIS — Z79899 Other long term (current) drug therapy: Secondary | ICD-10-CM | POA: Diagnosis not present

## 2020-10-15 DIAGNOSIS — Z1329 Encounter for screening for other suspected endocrine disorder: Secondary | ICD-10-CM | POA: Diagnosis not present

## 2020-10-15 DIAGNOSIS — Z1231 Encounter for screening mammogram for malignant neoplasm of breast: Secondary | ICD-10-CM | POA: Diagnosis not present

## 2020-10-15 DIAGNOSIS — Z6828 Body mass index (BMI) 28.0-28.9, adult: Secondary | ICD-10-CM | POA: Diagnosis not present

## 2020-10-15 DIAGNOSIS — Z01419 Encounter for gynecological examination (general) (routine) without abnormal findings: Secondary | ICD-10-CM | POA: Diagnosis not present

## 2020-11-11 DIAGNOSIS — Z1152 Encounter for screening for COVID-19: Secondary | ICD-10-CM | POA: Diagnosis not present

## 2020-11-12 DIAGNOSIS — J069 Acute upper respiratory infection, unspecified: Secondary | ICD-10-CM | POA: Diagnosis not present

## 2021-03-09 DIAGNOSIS — H1032 Unspecified acute conjunctivitis, left eye: Secondary | ICD-10-CM | POA: Diagnosis not present

## 2021-04-21 DIAGNOSIS — Z20822 Contact with and (suspected) exposure to covid-19: Secondary | ICD-10-CM | POA: Diagnosis not present

## 2021-10-11 ENCOUNTER — Emergency Department (HOSPITAL_BASED_OUTPATIENT_CLINIC_OR_DEPARTMENT_OTHER)
Admission: EM | Admit: 2021-10-11 | Discharge: 2021-10-11 | Disposition: A | Discharge status: Home / Self Care | Payer: BC Managed Care – PPO | Attending: Emergency Medicine | Admitting: Emergency Medicine

## 2021-10-11 ENCOUNTER — Encounter (HOSPITAL_BASED_OUTPATIENT_CLINIC_OR_DEPARTMENT_OTHER): Payer: Self-pay | Admitting: Emergency Medicine

## 2021-10-11 ENCOUNTER — Other Ambulatory Visit: Payer: Self-pay

## 2021-10-11 ENCOUNTER — Emergency Department (HOSPITAL_BASED_OUTPATIENT_CLINIC_OR_DEPARTMENT_OTHER): Payer: BC Managed Care – PPO

## 2021-10-11 DIAGNOSIS — M545 Low back pain, unspecified: Secondary | ICD-10-CM | POA: Diagnosis not present

## 2021-10-11 DIAGNOSIS — R109 Unspecified abdominal pain: Secondary | ICD-10-CM | POA: Diagnosis not present

## 2021-10-11 DIAGNOSIS — Z96642 Presence of left artificial hip joint: Secondary | ICD-10-CM | POA: Insufficient documentation

## 2021-10-11 DIAGNOSIS — N132 Hydronephrosis with renal and ureteral calculous obstruction: Secondary | ICD-10-CM | POA: Diagnosis not present

## 2021-10-11 DIAGNOSIS — Z87891 Personal history of nicotine dependence: Secondary | ICD-10-CM | POA: Insufficient documentation

## 2021-10-11 DIAGNOSIS — R310 Gross hematuria: Secondary | ICD-10-CM | POA: Diagnosis not present

## 2021-10-11 DIAGNOSIS — N200 Calculus of kidney: Secondary | ICD-10-CM

## 2021-10-11 DIAGNOSIS — K219 Gastro-esophageal reflux disease without esophagitis: Secondary | ICD-10-CM | POA: Diagnosis not present

## 2021-10-11 DIAGNOSIS — Z96653 Presence of artificial knee joint, bilateral: Secondary | ICD-10-CM | POA: Insufficient documentation

## 2021-10-11 LAB — URINALYSIS, ROUTINE W REFLEX MICROSCOPIC
Bilirubin Urine: NEGATIVE
Glucose, UA: NEGATIVE mg/dL
Ketones, ur: NEGATIVE mg/dL
Leukocytes,Ua: NEGATIVE
Nitrite: NEGATIVE
Protein, ur: NEGATIVE mg/dL
Specific Gravity, Urine: 1.008 (ref 1.005–1.030)
pH: 7 (ref 5.0–8.0)

## 2021-10-11 MED ORDER — KETOROLAC TROMETHAMINE 15 MG/ML IJ SOLN
15.0000 mg | Freq: Once | INTRAMUSCULAR | Status: AC
  Administered 2021-10-11: 15 mg via INTRAMUSCULAR
  Filled 2021-10-11: qty 1

## 2021-10-11 MED ORDER — TAMSULOSIN HCL 0.4 MG PO CAPS: 0.4000 mg | ORAL_CAPSULE | Freq: Every day | ORAL | 0 refills | Status: AC

## 2021-10-11 MED ORDER — TAMSULOSIN HCL 0.4 MG PO CAPS
0.4000 mg | ORAL_CAPSULE | Freq: Once | ORAL | Status: AC
  Administered 2021-10-11: 0.4 mg via ORAL
  Filled 2021-10-11: qty 1

## 2021-10-11 MED ORDER — HYDROCODONE-ACETAMINOPHEN 5-325 MG PO TABS: 1.0000 | ORAL_TABLET | Freq: Four times a day (QID) | ORAL | 0 refills | Status: DC | PRN

## 2021-10-11 MED ORDER — ONDANSETRON HCL 4 MG PO TABS: 4.0000 mg | ORAL_TABLET | Freq: Four times a day (QID) | ORAL | 0 refills | Status: AC

## 2021-10-11 MED ORDER — ONDANSETRON 4 MG PO TBDP
4.0000 mg | ORAL_TABLET | Freq: Once | ORAL | Status: AC
  Administered 2021-10-11: 4 mg via ORAL
  Filled 2021-10-11: qty 1

## 2021-10-11 NOTE — Discharge Instructions (Addendum)
Today you were diagnosed with a kidney stone on your CT scan.  You will be given a prescription for Flomax, pain medication, and nausea medication.  You should not drive, work, or operate machinery while taking the pain medication as it can make you very drowsy.  You will need to follow-up with urology for reevaluation and for further treatment of your kidney stone.  You will need to return to the emergency department for any fevers, persistent pain, persistent vomiting, inability to urinate, or any new or worsening symptoms.  

## 2021-10-11 NOTE — ED Triage Notes (Signed)
Pt arrives pov, referred from UC, endorses left flank pain since early am. Endorses nausea, denies dysuria

## 2021-10-11 NOTE — ED Provider Notes (Signed)
[facility] EMERGENC[facility] DEPT Provider Note   CSN: [id number] Arrival date & time: 10/11/21  1010     History Chief Complaint  Patient presents with   Flank Pain    Molly Beck is a 55 y.o. female.  HPI   Pt is a 55 y/o female with a h/o allergies, anxiety, GERD, OA, SBO, who presents to the ED today for eval of left flank pain that woke her up last night. Pain initially 8-10/10. Now pain is 4/10. Pain has been constant since onset. Denies fevers, NVD, constipation, dysuria. She had some mild suprapubic pressure today after urinating but sxs felt different from a UTI. Denies exacerbating or alleviating factors. She was sent here from [facility] walk in clinic for further eval.   Past Medical History:  Diagnosis Date   Allergy    Anxiety    GERD (gastroesophageal reflux disease)    Headache    migraines   Heartburn    Intraabdominal fluid collection    OA (osteoarthritis)    T/O   PONV (postoperative nausea and vomiting)    Recurrent cold sores    SBO (small bowel obstruction) (HCC)    Tendon laceration    right  small finger   Wears glasses     Patient Active Problem List   Diagnosis Date Noted   Diverticulitis s/p colostomy takedown 10/19/2019 10/21/2019   Anxiety state 10/20/2019   Migraine 10/20/2019   Overweight (BMI 25.0-29.9) 09/19/2019   History of repair of hip joint 09/18/2019   Intra-abdominal collection    Encounter for imaging study to confirm nasogastric (NG) tube placement    Perforation of sigmoid colon ([place]) 04/09/2019   Colitis 04/05/2019   Sepsis ([place]) 04/05/2019   Hypokalemia 04/05/2019   H/O foot surgery 04/05/2019   Low back pain 07/07/2018   Chronic pain 01/11/2018    Past Surgical History:  Procedure Laterality Date   COLON RESECTION N/A 04/18/2019   Procedure: EXPLORATORY LAPAROTOMY, HARTMAN'S RESECTION AND SMALL BOWEL RESECTION WITH COLOSTOMY;  Surgeon: Leighton Ruff, MD;  Location: [facility];  Service: General;   Laterality: N/A;   COLONOSCOPY N/A 07/12/2019   Procedure: COLONOSCOPY THROUGH OSTOMY;  Surgeon: Leighton Ruff, MD;  [facility];  Service: Endoscopy;  Laterality: N/A;  IV SEDATION BY SURGEON   CYSTOSCOPY WITH STENT PLACEMENT Bilateral 04/18/2019   Procedure: CYSTOSCOPY WITH STENT PLACEMENT;  Surgeon: Bjorn Loser, MD;  Location: [facility];  Service: Urology;  Laterality: Bilateral;   FOOT SURGERY     tendon and ligament repair with screws  03-27-19   I & D EXTREMITY Right 05/31/2017   Procedure: Right small finger irrigation and debridement and flexor tendon reconstruction;  Surgeon: Roseanne Kaufman, MD;  Location: [place];  Service: Orthopedics;  Laterality: Right;   KNEE ARTHROSCOPY     B/L   MEDIAL PARTIAL KNEE REPLACEMENT     bil    TENDON REPAIR     left thumb   TOTAL HIP ARTHROPLASTY Left 09/18/2019   Procedure: TOTAL HIP ARTHROPLASTY ANTERIOR APPROACH;  Surgeon: Paralee Cancel, MD;  [facility];  Service: Orthopedics;  Laterality: Left;  70 mins   WISDOM TOOTH EXTRACTION     XI ROBOTIC ASSISTED COLOSTOMY TAKEDOWN N/A 10/19/2019   Procedure: XI ROBOTIC ASSISTED COLOSTOMY REVERSAL;  Surgeon: Leighton Ruff, MD;  Location: [facility];  Service: General;  Laterality: N/A;     OB History   No obstetric history on file.     Family History  Problem Relation Age  of Onset   Macular degeneration Mother    Pancreatic cancer Father    Other Sister     Social History   Tobacco Use   Smoking status: Former   Smokeless tobacco: Never   Tobacco comments:    during college only  Vaping Use   Vaping Use: Never used  Substance Use Topics   Alcohol use: Yes    Comment: occasional   Drug use: No    Home Medications Prior to Admission medications   Medication Sig Start Date End Date Taking? Authorizing Provider  HYDROcodone-acetaminophen (NORCO/VICODIN) 5-325 MG tablet Take 1 tablet by mouth every 6 (six) hours as needed. 10/11/21  Yes Concetta Guion S, [name]   ondansetron (ZOFRAN) 4 MG tablet Take 1 tablet (4 mg total) by mouth every 6 (six) hours. 10/11/21  Yes Cobey Raineri S, [name]  tamsulosin (FLOMAX) 0.4 MG CAPS capsule Take 1 capsule (0.4 mg total) by mouth daily. 10/11/21 11/10/21 Yes Miosha Behe S, [name]  acetaminophen (TYLENOL) 500 MG tablet Take 2 tablets (1,000 mg total) by mouth every 8 (eight) hours. 09/19/19   Danae Orleans, [name]  ALPRAZolam Duanne Moron) 0.25 MG tablet Take 0.25 mg by mouth 2 (two) times daily as needed for anxiety.    [provider]  cetirizine (ZYRTEC) 10 MG tablet Take 10 mg by mouth daily.    [provider]  cholecalciferol (VITAMIN D3) 25 MCG (1000 UT) tablet Take 2,000 Units by mouth daily.     [provider]  eletriptan (RELPAX) 40 MG tablet Take 40 mg by mouth as needed for migraine or headache. May repeat in 2 hours if headache persists or recurs.    [provider]  Estradiol (DIVIGEL) 0.5 123XX123 GEL Place 1 application onto the skin daily.    [provider]  ferrous sulfate (FERROUSUL) 325 (65 FE) MG tablet Take 1 tablet (325 mg total) by mouth 3 (three) times daily with meals for 14 days. Patient not taking: Reported on 10/11/2019 09/19/19 10/03/19  Danae Orleans, [name]  fluticasone [facility]) 50 MCG/ACT nasal spray Place 1 spray into both nostrils daily.    [provider]  gabapentin (NEURONTIN) 300 MG capsule Take 300 mg by mouth 3 (three) times daily as needed (pain).    [provider]  LACTASE PO Take 5 mg by mouth daily as needed (for dairy consumption).     [provider]  levonorgestrel (MIRENA) 20 MCG/24HR IUD 1 each by Intrauterine route once.    [provider]  methocarbamol (ROBAXIN) 500 MG tablet Take 1-2 tablets (500-1,000 mg total) by mouth every 6 (six) hours as needed for muscle spasms. 123XX123   Leighton Ruff, MD  montelukast (SINGULAIR) 10 MG tablet Take 10 mg by mouth daily at 2 PM. 04/11/17   [provider]  Multiple Vitamins-Minerals (MULTIVITAMIN WITH MINERALS) tablet Take 2 tablets by mouth daily.     [provider]  oxyCODONE (OXY IR/ROXICODONE) 5 MG immediate release tablet Take 1-2 tablets (5-10 mg total) by mouth every 6 (six) hours as needed for moderate pain, severe pain or breakthrough pain. 123XX123   Leighton Ruff, MD  valACYclovir (VALTREX) 500 MG tablet Take 500 mg by mouth 2 (two) times daily as needed (cold sores).  03/24/17   [provider]    Allergies    Celecoxib, Clarithromycin, and Codeine  Review of Systems   Review of Systems  Constitutional:  Negative for fever.  HENT:  Negative for ear pain and sore  throat.   Eyes:  Negative for visual disturbance.  Respiratory:  Negative for cough and shortness of breath.   Cardiovascular:  Negative for chest pain.  Gastrointestinal:  Negative for abdominal pain, constipation, diarrhea, nausea and vomiting.  Genitourinary:  Positive for flank pain. Negative for dysuria, hematuria and urgency.  Musculoskeletal:  Negative for back pain.  Skin:  Negative for rash.  Neurological:  Negative for seizures and syncope.  All other systems reviewed and are negative.  Physical Exam Updated Vital Signs BP (!) 143/88 (BP Location: Left Arm)   Pulse 66   Temp 98.7 F (37.1 C) (Oral)   Resp 17   Ht 5\' 4"  (1.626 m)   Wt 74.8 kg   SpO2 100%   BMI 28.32 kg/m   Physical Exam Vitals and nursing note reviewed.  Constitutional:      General: She is not in acute distress.    Appearance: She is well-developed.  HENT:     Head: Normocephalic and atraumatic.  Eyes:     Conjunctiva/sclera: Conjunctivae normal.  Cardiovascular:     Rate and Rhythm: Normal rate and regular rhythm.     Heart sounds: No murmur heard. Pulmonary:     Effort: Pulmonary effort is normal. No respiratory distress.     Breath sounds: Normal breath sounds.  Abdominal:     Palpations: Abdomen is soft.     Tenderness: There is  abdominal tenderness in the left upper quadrant and left lower quadrant. There is left CVA tenderness.  Musculoskeletal:        General: No swelling.     Cervical back: Neck supple.  Skin:    General: Skin is warm and dry.     Capillary Refill: Capillary refill takes less than 2 seconds.  Neurological:     Mental Status: She is alert.  Psychiatric:        Mood and Affect: Mood normal.    ED Results / Procedures / Treatments   Labs (all labs ordered are listed, but only abnormal results are displayed) Labs Reviewed  URINALYSIS, ROUTINE W REFLEX MICROSCOPIC - Abnormal; Notable for the following components:      Result Value   Color, Urine COLORLESS (*)    Hgb urine dipstick MODERATE (*)    Bacteria, UA RARE (*)    All other components within normal limits    EKG None  Radiology CT Renal Stone Study  Result Date: 10/11/2021 CLINICAL DATA:  Flank pain, kidney stone suspected EXAM: CT ABDOMEN AND PELVIS WITHOUT CONTRAST TECHNIQUE: Multidetector CT imaging of the abdomen and pelvis was performed following the standard protocol without IV contrast. COMPARISON:  June 2020 FINDINGS: Lower chest: No acute abnormality. Hepatobiliary: No focal liver abnormality is seen. No gallstones, gallbladder wall thickening, or biliary dilatation. Pancreas: Unremarkable. Spleen: Unremarkable. Adrenals/Urinary Tract: Adrenals are unremarkable. There are no renal calculi. Left hydronephrosis. There is a 7 mm calculus within the proximal left ureter. Mild left perinephric stranding. The bladder is poorly distended and there is significant streak artifact in this region. Stomach/Bowel: Stomach is within normal limits. Bowel is normal in caliber. Small-bowel anastomosis in the left lower quadrant. Vascular/Lymphatic: No significant vascular abnormality. No enlarged lymph nodes. Reproductive: Intrauterine device is present.  No adnexal mass. Other: No abdominal wall hernia. Musculoskeletal: Left total hip  arthroplasty with associated streak artifact. Lumbar spine degenerative changes IMPRESSION: 7 mm obstructing calculus of the proximal left ureter with hydronephrosis. Electronically Signed   By: July 2020 M.D.   On:  10/11/2021 15:27    Procedures Procedures   Medications Ordered in ED Medications  ketorolac (TORADOL) 15 MG/ML injection 15 mg (15 mg Intramuscular Given 10/11/21 1640)  tamsulosin (FLOMAX) capsule 0.4 mg (0.4 mg Oral Given 10/11/21 1640)  ondansetron (ZOFRAN-ODT) disintegrating tablet 4 mg (4 mg Oral Given 10/11/21 1640)    ED Course  I have reviewed the triage vital signs and the nursing notes.  Pertinent labs & imaging results that were available during my care of the patient were reviewed by me and considered in my medical decision making (see chart for details).    MDM Rules/Calculators/A&P                          55 year old female presents emergency department today for evaluation of left flank pain that started last night  UA shows hematuria but no evidence for infection  Reviewed/interpreted imaging CT renal - 7 mm obstructing calculus of the proximal left ureter with hydronephrosis  Patient was given Toradol, Flomax and Zofran.  On reassessment she states she is feeling some improvement in her pain.  Has been able to tolerate p.o.  We discussed treatment with Flomax Zofran and pain medication.  Have advised plan for follow-up with [facility].  She voices understanding and is in agreement with plan.  All questions answered.  Patient stable for discharge.  Final Clinical Impression(s) / ED Diagnoses Final diagnoses:  Kidney stone    Rx / DC Orders ED Discharge Orders          Ordered    ondansetron (ZOFRAN) 4 MG tablet  Every 6 hours        10/11/21 1711    tamsulosin (FLOMAX) 0.4 MG CAPS capsule  Daily        10/11/21 1711    HYDROcodone-acetaminophen (NORCO/VICODIN) 5-325 MG tablet  Every 6 hours PRN        10/11/21 1711              Avaree Gilberti S, [name] 10/11/21 [place], [place], DO 10/13/21 [phone]

## 2021-10-12 ENCOUNTER — Other Ambulatory Visit: Payer: Self-pay | Admitting: Urology

## 2021-10-12 DIAGNOSIS — N201 Calculus of ureter: Secondary | ICD-10-CM | POA: Diagnosis not present

## 2021-10-12 DIAGNOSIS — N132 Hydronephrosis with renal and ureteral calculous obstruction: Secondary | ICD-10-CM | POA: Diagnosis not present

## 2021-10-13 NOTE — Progress Notes (Signed)
10/13/2021 3:01 PM Left voice message to return call and discuss information for procedure 12/15. Khiana Camino, Blanchard Kelch

## 2021-10-14 NOTE — Progress Notes (Signed)
Patient to arrive at 1430 on 10/15/2021. History and medications reviewed. Pre-procedure instructions given. NPO after 1030 on day of procedure except for clear liquids until 1230. Driver secured.

## 2021-10-14 NOTE — Progress Notes (Signed)
Left message to return call 

## 2021-10-15 ENCOUNTER — Other Ambulatory Visit: Payer: Self-pay

## 2021-10-15 ENCOUNTER — Ambulatory Visit (HOSPITAL_BASED_OUTPATIENT_CLINIC_OR_DEPARTMENT_OTHER)
Admission: RE | Admit: 2021-10-15 | Discharge: 2021-10-15 | Disposition: A | Discharge status: Home / Self Care | Payer: BC Managed Care – PPO | Attending: Urology | Admitting: Urology

## 2021-10-15 ENCOUNTER — Ambulatory Visit (HOSPITAL_COMMUNITY): Payer: BC Managed Care – PPO

## 2021-10-15 ENCOUNTER — Encounter (HOSPITAL_BASED_OUTPATIENT_CLINIC_OR_DEPARTMENT_OTHER): Payer: Self-pay | Admitting: Urology

## 2021-10-15 ENCOUNTER — Encounter (HOSPITAL_BASED_OUTPATIENT_CLINIC_OR_DEPARTMENT_OTHER)
Admission: RE | Disposition: A | Discharge status: Home / Self Care | Payer: Self-pay | Source: Home / Self Care | Attending: Urology

## 2021-10-15 DIAGNOSIS — E669 Obesity, unspecified: Secondary | ICD-10-CM | POA: Diagnosis not present

## 2021-10-15 DIAGNOSIS — N201 Calculus of ureter: Secondary | ICD-10-CM

## 2021-10-15 DIAGNOSIS — Z01818 Encounter for other preprocedural examination: Secondary | ICD-10-CM | POA: Diagnosis not present

## 2021-10-15 DIAGNOSIS — Z6829 Body mass index (BMI) 29.0-29.9, adult: Secondary | ICD-10-CM | POA: Diagnosis not present

## 2021-10-15 DIAGNOSIS — I878 Other specified disorders of veins: Secondary | ICD-10-CM | POA: Diagnosis not present

## 2021-10-15 HISTORY — PX: EXTRACORPOREAL SHOCK WAVE LITHOTRIPSY: SHX1557

## 2021-10-15 SURGERY — LITHOTRIPSY, ESWL
Anesthesia: LOCAL | Laterality: Left

## 2021-10-15 MED ORDER — DIAZEPAM 5 MG PO TABS
ORAL_TABLET | ORAL | Status: AC
  Filled 2021-10-15: qty 2

## 2021-10-15 MED ORDER — CIPROFLOXACIN HCL 500 MG PO TABS
ORAL_TABLET | ORAL | Status: AC
  Filled 2021-10-15: qty 1

## 2021-10-15 MED ORDER — DIAZEPAM 5 MG PO TABS
10.0000 mg | ORAL_TABLET | ORAL | Status: AC
  Administered 2021-10-15: 10 mg via ORAL

## 2021-10-15 MED ORDER — DIPHENHYDRAMINE HCL 25 MG PO CAPS
ORAL_CAPSULE | ORAL | Status: AC
  Filled 2021-10-15: qty 1

## 2021-10-15 MED ORDER — DOCUSATE SODIUM 100 MG PO CAPS: 100.0000 mg | ORAL_CAPSULE | Freq: Every day | ORAL | 0 refills | Status: AC | PRN

## 2021-10-15 MED ORDER — CIPROFLOXACIN HCL 500 MG PO TABS
500.0000 mg | ORAL_TABLET | ORAL | Status: AC
  Administered 2021-10-15: 500 mg via ORAL

## 2021-10-15 MED ORDER — SODIUM CHLORIDE 0.9 % IV SOLN
INTRAVENOUS | Status: DC
  Administered 2021-10-15: 1000 mL via INTRAVENOUS

## 2021-10-15 MED ORDER — DIPHENHYDRAMINE HCL 25 MG PO CAPS
25.0000 mg | ORAL_CAPSULE | ORAL | Status: AC
  Administered 2021-10-15: 25 mg via ORAL

## 2021-10-15 MED ORDER — OXYCODONE-ACETAMINOPHEN 5-325 MG PO TABS: 1.0000 | ORAL_TABLET | ORAL | 0 refills | Status: AC | PRN

## 2021-10-15 NOTE — Op Note (Signed)
[facility] Operative Note  Treating Physician: Khristina Janota, MD  Pre-op diagnosis: Left proximal ureteral stone  Post-op diagnosis: Same   Procedure: Left ESWL  See [facility] OP note scanned into chart. Also because of the size, density, location and other factors that cannot be anticipated I feel this will likely be a staged procedure. This fact supersedes any indication in the scanned [facility] operative note to the contrary.  Matt R. Albert Hersch MD [facility]  Pager: [phone]   

## 2021-10-15 NOTE — H&P (Signed)
Urology Preoperative H&P   Chief Complaint: Left proximal ureteral stone  History of Present Illness: Molly Beck is a 55 y.o. female with a left proximal ureteral stone here for left ESWL.    Past Medical History:  Diagnosis Date   Allergy    Anxiety    GERD (gastroesophageal reflux disease)    Headache    migraines   Heartburn    Intraabdominal fluid collection    OA (osteoarthritis)    T/O   PONV (postoperative nausea and vomiting)    Recurrent cold sores    SBO (small bowel obstruction) (HCC)    Tendon laceration    right  small finger   Wears glasses     Past Surgical History:  Procedure Laterality Date   COLON RESECTION N/A 04/18/2019   Procedure: EXPLORATORY LAPAROTOMY, HARTMAN'S RESECTION AND SMALL BOWEL RESECTION WITH COLOSTOMY;  Surgeon: Romie Levee, MD;  [facility];  Service: General;  Laterality: N/A;   COLONOSCOPY N/A 07/12/2019   Procedure: COLONOSCOPY THROUGH OSTOMY;  Surgeon: Romie Levee, MD;  [facility];  Service: Endoscopy;  Laterality: N/A;  IV SEDATION BY SURGEON   CYSTOSCOPY WITH STENT PLACEMENT Bilateral 04/18/2019   Procedure: CYSTOSCOPY WITH STENT PLACEMENT;  Surgeon: Alfredo Martinez, MD;  Location: [facility];  Service: Urology;  Laterality: Bilateral;   FOOT SURGERY     tendon and ligament repair with screws  03-27-19   I & D EXTREMITY Right 05/31/2017   Procedure: Right small finger irrigation and debridement and flexor tendon reconstruction;  Surgeon: Dominica Severin, MD;  [facility];  Service: Orthopedics;  Laterality: Right;   KNEE ARTHROSCOPY     B/L   MEDIAL PARTIAL KNEE REPLACEMENT     bil    TENDON REPAIR     left thumb   TOTAL HIP ARTHROPLASTY Left 09/18/2019   Procedure: TOTAL HIP ARTHROPLASTY ANTERIOR APPROACH;  Surgeon: Durene Romans, MD;  Location[facility];  Service: Orthopedics;  Laterality: Left;  70 mins   WISDOM TOOTH EXTRACTION     XI ROBOTIC ASSISTED COLOSTOMY TAKEDOWN N/A 10/19/2019   Procedure:  XI ROBOTIC ASSISTED COLOSTOMY REVERSAL;  Surgeon: Romie Levee, MD;  [facility]: [facility];  Service: General;  Laterality: N/A;    Allergies:  Allergies  Allergen Reactions   Celecoxib Other (See Comments)   Clarithromycin Nausea And Vomiting   Codeine Nausea Only    Family History  Problem Relation Age of Onset   Macular degeneration Mother    Pancreatic cancer Father    Other Sister     Social History:  reports that she has quit smoking. She has never used smokeless tobacco. She reports current alcohol use. She reports that she does not use drugs.  ROS: A complete review of systems was performed.  All systems are negative except for pertinent findings as noted.  Physical Exam:  Vital signs in last 24 hours: Temp:  [99.1 F (37.3 C)] 99.1 F (37.3 C) (12/15 1434) Pulse Rate:  [80] 80 (12/15 1434) Resp:  [16] 16 (12/15 1434) BP: (133)/(96) 133/96 (12/15 1434) SpO2:  [98 %] 98 % (12/15 1434) Weight:  [77.1 kg] 77.1 kg (12/15 1507) Constitutional:  Alert and oriented, No acute distress Cardiovascular: Regular rate and rhythm Respiratory: Normal respiratory effort, Lungs clear bilaterally GI: Abdomen is soft, nontender, nondistended, no abdominal masses GU: No CVA tenderness Lymphatic: No lymphadenopathy Neurologic: Grossly intact, no focal deficits Psychiatric: Normal mood and affect  Laboratory Data:  No results for input(s): WBC, HGB,  HCT, PLT in the last 72 hours.  No results for input(s): NA, K, CL, GLUCOSE, BUN, CALCIUM, CREATININE in the last 72 hours.  Invalid input(s): CO3   No results found for this or any previous visit (from the past 24 hour(s)). No results found for this or any previous visit (from the past 240 hour(s)).  Renal Function: No results for input(s): CREATININE in the last 168 hours. CrCl cannot be calculated (Patient's most recent lab result is older than the maximum 21 days allowed.).  Radiologic Imaging: No results found.  I  independently reviewed the above imaging studies.  Assessment and Plan Molly Beck is a 55 y.o. female with a left proximal ureteral stone here for left ESWL.  The risks, benefits and alternatives of left ESWL was discussed with the patient. I described the risks which include arrhythmia, kidney contusion, kidney hemorrhage, need for transfusion, back discomfort, flank ecchymosis, flank abrasion, inability to fracture the stone, inability to pass stone fragments, Steinstrasse, infection associated with obstructing stones, need for an alternative surgical procedure and possible need for repeat shockwave lithotripsy.  The patient voices understanding and wishes to proceed.       Molly R. [name] MD 10/15/2021, 4:19 PM  [facility] Pager: [phone]): [phone]

## 2021-10-15 NOTE — Discharge Instructions (Addendum)
Activity:  You are encouraged to ambulate frequently (about every hour during waking hours) to help prevent blood clots from forming in your legs or lungs.    Diet: You should advance your diet as instructed by your physician.  It will be normal to have some bloating, nausea, and abdominal discomfort intermittently.  Prescriptions:  You will be provided a prescription for pain medication to take as needed.  If your pain is not severe enough to require the prescription pain medication, you may take extra strength Tylenol instead which will have less side effects.  You should also take a prescribed stool softener to avoid straining with bowel movements as the prescription pain medication may constipate you.  What to call us about: You should call the office ([phone]) if you develop fever > 101 or develop persistent vomiting. Activity:  You are encouraged to ambulate frequently (about every hour during waking hours) to help prevent blood clots from forming in your legs or lungs.          Post Anesthesia Home Care Instructions  Activity: Get plenty of rest for the remainder of the day. A responsible individual must stay with you for 24 hours following the procedure.  For the next 24 hours, DO NOT: -Drive a car -Operate machinery -Drink alcoholic beverages -Take any medication unless instructed by your physician -Make any legal decisions or sign important papers.  Meals: Start with liquid foods such as gelatin or soup. Progress to regular foods as tolerated. Avoid greasy, spicy, heavy foods. If nausea and/or vomiting occur, drink only clear liquids until the nausea and/or vomiting subsides. Call your physician if vomiting continues.  Special Instructions/Symptoms: Your throat may feel dry or sore from the anesthesia or the breathing tube placed in your throat during surgery. If this causes discomfort, gargle with warm salt water. The discomfort should disappear within 24 hours.       

## 2021-10-16 ENCOUNTER — Encounter (HOSPITAL_BASED_OUTPATIENT_CLINIC_OR_DEPARTMENT_OTHER): Payer: Self-pay | Admitting: Urology

## 2021-10-20 ENCOUNTER — Other Ambulatory Visit: Payer: Self-pay

## 2021-10-20 ENCOUNTER — Other Ambulatory Visit: Payer: Self-pay | Admitting: Urology

## 2021-10-20 ENCOUNTER — Encounter (HOSPITAL_BASED_OUTPATIENT_CLINIC_OR_DEPARTMENT_OTHER): Payer: Self-pay | Admitting: Urology

## 2021-10-20 NOTE — Progress Notes (Signed)
Spoke w/ via phone for pre-op interview--- [facility] Lab needs dos---- NONE              Lab results------ COVID test -----patient states asymptomatic no test needed Arrive at ------- 1115 NPO after MN NO Solid Food.  Clear liquids from MN until---1015 Med rec completed Medications to take morning of surgery -----Relpax,xanax, oxycodone PRN.  Diabetic medication ----- Patient instructed no nail polish to be worn day of surgery Patient instructed to bring photo id and insurance card day of surgery Patient aware to have Driver (ride ) / caregiver    for 24 hours after surgery  Patient Special Instructions ----- Pre-Op special Istructions ----- Patient verbalized understanding of instructions that were given at this phone interview. Patient denies shortness of breath, chest pain, fever, cough at this phone interview.

## 2021-10-22 NOTE — H&P (Signed)
CC/HPI: cc: ureteral calculus   10/12/21: 55 year old woman presented to the ED with acute onset left flank pain found to have a 9 mm left proximal ureteral calculus with hydronephrosis. This was confirmed on CT scan on 10/11/2021. This is her first kidney stone episode. She denies any fevers, chills or emesis. She has had nausea. Pain is moderately controlled with hydrocodone.     ALLERGIES: codeine    MEDICATIONS: Tamsulosin Hcl 0.4 mg capsule  Zyrtec  Flonase Allergy Relief  Meloxicam 15 mg tablet  Oxycodone-Acetaminophen  Replax  Tramadol Hcl 50 mg tablet  Tylenol  Zofran 4 mg tablet     GU PSH: Cysto Remove Stent FB Sim - 2020, 2020, 2020 Cystoscopy Insert Stent, Bilateral - 2020     NON-GU PSH: Colostomy - 2020 Foot surgery (unspecified), Right - 2020 Partial colectomy - 2020     GU PMH: Acute Cystitis/UTI - 2020 Urinary Tract Inf, Unspec site - 2020    NON-GU PMH: Anxiety Arthritis GERD    FAMILY HISTORY: None   SOCIAL HISTORY: Marital Status: Divorced Preferred Language: English; Ethnicity: Not Hispanic Or Latino; Race: White Current Smoking Status: Patient does not smoke anymore.   Tobacco Use Assessment Completed: Used Tobacco in last 30 days? Social Drinker.  Drinks 1 caffeinated drink per day.    REVIEW OF SYSTEMS:    GU Review Female:   Patient denies frequent urination, hard to postpone urination, burning /pain with urination, get up at night to urinate, leakage of urine, stream starts and stops, trouble starting your stream, have to strain to urinate, and being pregnant.  Gastrointestinal (Upper):   Patient denies nausea, vomiting, and indigestion/ heartburn.  Gastrointestinal (Lower):   Patient denies diarrhea and constipation.  Constitutional:   Patient denies fever, night sweats, weight loss, and fatigue.  Skin:   Patient denies skin rash/ lesion and itching.  Eyes:   Patient denies blurred vision and double vision.  Ears/ Nose/ Throat:    Patient denies sore throat and sinus problems.  Hematologic/Lymphatic:   Patient denies swollen glands and easy bruising.  Cardiovascular:   Patient denies leg swelling and chest pains.  Respiratory:   Patient denies cough and shortness of breath.  Endocrine:   Patient denies excessive thirst.  Musculoskeletal:   Patient denies back pain and joint pain.  Neurological:   Patient denies headaches and dizziness.  Psychologic:   Patient denies depression and anxiety.   VITAL SIGNS:      10/12/2021 12:41 PM  Weight 165 lb / 74.84 kg  Height 64 in / 162.56 cm  BP 118/76 mmHg  Pulse 91 /min  Temperature 97.1 F / 36.1 C  BMI 28.3 kg/m   MULTI-SYSTEM PHYSICAL EXAMINATION:    Constitutional: Well-nourished. No physical deformities. Normally developed. Good grooming.  Neck: Neck symmetrical, not swollen. Normal tracheal position.  Respiratory: No labored breathing, no use of accessory muscles.   Skin: No paleness, no jaundice, no cyanosis. No lesion, no ulcer, no rash.  Neurologic / Psychiatric: Oriented to time, oriented to place, oriented to person. No depression, no anxiety, no agitation.  Gastrointestinal: No rigidity, non obese abdomen.   Eyes: Normal conjunctivae. Normal eyelids.  Ears, Nose, Mouth, and Throat: Left ear no scars, no lesions, no masses. Right ear no scars, no lesions, no masses. Nose no scars, no lesions, no masses. Normal hearing. Normal lips.  Musculoskeletal: Normal gait and station of head and neck.     Complexity of Data:  Records Review:   Previous  Hospital Records, Previous Patient Records  Urine Test Review:   Urinalysis  X-Ray Review: C.T. Abdomen/Pelvis: Reviewed Films. Reviewed Report. Discussed With Patient. IMPRESSION: 7 mm obstructing calculus of the proximal left ureter with hydronephrosis. Electronically Signed By: Guadlupe Spanish M.D. On: 10/11/2021 15:27    PROCEDURES:          Urinalysis w/Scope Dipstick Dipstick Cont'd Micro  Color: Straw  Bilirubin: Neg mg/dL WBC/hpf: 0 - 5/hpf  Appearance: Clear Ketones: Neg mg/dL RBC/hpf: 0 - 2/hpf  Specific Gravity: 1.010 Blood: Trace ery/uL Bacteria: NS (Not Seen)  pH: 6.0 Protein: Neg mg/dL Cystals: NS (Not Seen)  Glucose: Neg mg/dL Urobilinogen: 0.2 mg/dL Casts: NS (Not Seen)    Nitrites: Neg Trichomonas: Not Present    Leukocyte Esterase: Neg leu/uL Mucous: Not Present      Epithelial Cells: NS (Not Seen)      Yeast: NS (Not Seen)      Sperm: Not Present    ASSESSMENT:      ICD-10 Details  1 GU:   Ureteral calculus - N20.1 Acute, Uncomplicated  2   Ureteral obstruction secondary to calculous - N13.2 Acute, Uncomplicated   PLAN:            Medications New Meds: Oxycodone Hcl 5 mg tablet 1-2 tablet PO Q 4 H PRN kidney stone pain  #20  0 Refill(s)            Document Letter(s):  Created for Patient: Clinical Summary         Notes:   1. Left obstructing ureteral calculus: Discussed management options of ureteral calculus including medical expulsive therapy, ESWL and ureteroscopy with laser lithotripsy and stent placement. Stone is visible on scout from CT. Patient wishes to move forward with ESWL. The risks and benefits of the procedure were discussed the patient in detail including but not limited to pain, bleeding, failure to fragment stone, need for additional procedure, damage to kidney and surrounding structures, ureteral stricture, bleeding, infection, need for additional procedure/treatment. Patient understands these risks and will be scheduled for surgery later this week. She also be given a prescription for oxycodone for pain control and continue on the tamsulosin daily and Zofran as needed. She was instructed to hold all NSAIDs 48 hours ahead of time.

## 2021-10-26 NOTE — Anesthesia Preprocedure Evaluation (Addendum)
Anesthesia Evaluation  Patient identified by MRN, date of birth, ID band Patient awake    Reviewed: Allergy & Precautions, NPO status , Patient's Chart, lab work & pertinent test results  History of Anesthesia Complications (+) PONV  Airway Mallampati: II  TM Distance: >3 FB Neck ROM: Full    Dental no notable dental hx. (+) Teeth Intact, Dental Advisory Given   Pulmonary former smoker,    Pulmonary exam normal breath sounds clear to auscultation       Cardiovascular Normal cardiovascular exam Rhythm:Regular Rate:Normal     Neuro/Psych  Headaches, Anxiety    GI/Hepatic Neg liver ROS, GERD  Medicated,  Endo/Other    Renal/GU Renal diseaseL Ureteral Stone  Lab Results      Component                Value               Date                      CREATININE               0.57                10/22/2019                K                        3.9                 10/22/2019                      Musculoskeletal  (+) Arthritis , Lab Results      Component                Value               Date                           HGB                      10.7 (L)            10/22/2019                HCT                      33.1 (L)            10/22/2019                   Abdominal   Peds  Hematology   Anesthesia Other Findings VZD:GLOVFIEPP, Clarithromycin, codeine  Reproductive/Obstetrics                            Anesthesia Physical Anesthesia Plan  ASA: 1  Anesthesia Plan: General   Post-op Pain Management: Tylenol PO (pre-op)   Induction:   PONV Risk Score and Plan: 4 or greater and Treatment may vary due to age or medical condition, Midazolam, Dexamethasone, Ondansetron and Scopolamine patch - Pre-op  Airway Management Planned: LMA  Additional Equipment: None  Intra-op Plan:   Post-operative Plan:   Informed Consent: I have reviewed the patients History and Physical, chart, labs and  discussed the procedure including the risks, benefits and  alternatives for the proposed anesthesia with the patient or authorized representative who has indicated his/her understanding and acceptance.     Dental advisory given  Plan Discussed with: CRNA and Anesthesiologist  Anesthesia Plan Comments:        Anesthesia Quick Evaluation

## 2021-10-27 ENCOUNTER — Ambulatory Visit (HOSPITAL_BASED_OUTPATIENT_CLINIC_OR_DEPARTMENT_OTHER): Payer: BC Managed Care – PPO | Admitting: Anesthesiology

## 2021-10-27 ENCOUNTER — Other Ambulatory Visit: Payer: Self-pay

## 2021-10-27 ENCOUNTER — Encounter (HOSPITAL_BASED_OUTPATIENT_CLINIC_OR_DEPARTMENT_OTHER): Payer: Self-pay | Admitting: Urology

## 2021-10-27 ENCOUNTER — Ambulatory Visit (HOSPITAL_BASED_OUTPATIENT_CLINIC_OR_DEPARTMENT_OTHER)
Admission: RE | Admit: 2021-10-27 | Discharge: 2021-10-27 | Disposition: A | Discharge status: Home / Self Care | Payer: BC Managed Care – PPO | Attending: Urology | Admitting: Urology

## 2021-10-27 ENCOUNTER — Encounter (HOSPITAL_BASED_OUTPATIENT_CLINIC_OR_DEPARTMENT_OTHER)
Admission: RE | Disposition: A | Discharge status: Home / Self Care | Payer: Self-pay | Source: Home / Self Care | Attending: Urology

## 2021-10-27 DIAGNOSIS — K219 Gastro-esophageal reflux disease without esophagitis: Secondary | ICD-10-CM | POA: Insufficient documentation

## 2021-10-27 DIAGNOSIS — Z87891 Personal history of nicotine dependence: Secondary | ICD-10-CM | POA: Diagnosis not present

## 2021-10-27 DIAGNOSIS — M1990 Unspecified osteoarthritis, unspecified site: Secondary | ICD-10-CM | POA: Diagnosis not present

## 2021-10-27 DIAGNOSIS — N132 Hydronephrosis with renal and ureteral calculous obstruction: Secondary | ICD-10-CM | POA: Diagnosis not present

## 2021-10-27 DIAGNOSIS — F419 Anxiety disorder, unspecified: Secondary | ICD-10-CM | POA: Diagnosis not present

## 2021-10-27 DIAGNOSIS — N201 Calculus of ureter: Secondary | ICD-10-CM | POA: Diagnosis not present

## 2021-10-27 HISTORY — PX: CYSTOSCOPY WITH RETROGRADE PYELOGRAM, URETEROSCOPY AND STENT PLACEMENT: SHX5789

## 2021-10-27 HISTORY — PX: HOLMIUM LASER APPLICATION: SHX5852

## 2021-10-27 SURGERY — CYSTOURETEROSCOPY, WITH RETROGRADE PYELOGRAM AND STENT INSERTION
Anesthesia: General | Site: Renal | Laterality: Left

## 2021-10-27 MED ORDER — KETOROLAC TROMETHAMINE 30 MG/ML IJ SOLN
INTRAMUSCULAR | Status: AC
  Filled 2021-10-27: qty 1

## 2021-10-27 MED ORDER — CEFAZOLIN SODIUM-DEXTROSE 2-4 GM/100ML-% IV SOLN
2.0000 g | INTRAVENOUS | Status: AC
  Administered 2021-10-27: 12:00:00 2 g via INTRAVENOUS

## 2021-10-27 MED ORDER — CEPHALEXIN 500 MG PO CAPS
500.0000 mg | ORAL_CAPSULE | Freq: Two times a day (BID) | ORAL | 0 refills | Status: AC
Start: 2021-10-27 — End: 2021-10-31

## 2021-10-27 MED ORDER — PROPOFOL 10 MG/ML IV BOLUS
INTRAVENOUS | Status: AC
  Filled 2021-10-27: qty 20

## 2021-10-27 MED ORDER — 0.9 % SODIUM CHLORIDE (POUR BTL) OPTIME
TOPICAL | Status: DC | PRN
  Administered 2021-10-27: 12:00:00 1000 mL

## 2021-10-27 MED ORDER — SCOPOLAMINE 1 MG/3DAYS TD PT72
MEDICATED_PATCH | TRANSDERMAL | Status: AC
  Filled 2021-10-27: qty 1

## 2021-10-27 MED ORDER — DEXAMETHASONE SODIUM PHOSPHATE 4 MG/ML IJ SOLN
INTRAMUSCULAR | Status: DC | PRN
  Administered 2021-10-27: 10 mg via INTRAVENOUS

## 2021-10-27 MED ORDER — FENTANYL CITRATE (PF) 100 MCG/2ML IJ SOLN
INTRAMUSCULAR | Status: AC
  Filled 2021-10-27: qty 2

## 2021-10-27 MED ORDER — SODIUM CHLORIDE 0.9 % IR SOLN
Status: DC | PRN
  Administered 2021-10-27: 3000 mL

## 2021-10-27 MED ORDER — LACTATED RINGERS IV SOLN: INTRAVENOUS | Status: DC

## 2021-10-27 MED ORDER — MIDAZOLAM HCL 5 MG/5ML IJ SOLN
INTRAMUSCULAR | Status: DC | PRN
Start: 2021-10-27 — End: 2021-10-27
  Administered 2021-10-27: 2 mg via INTRAVENOUS

## 2021-10-27 MED ORDER — IOHEXOL 300 MG/ML  SOLN
INTRAMUSCULAR | Status: DC | PRN
  Administered 2021-10-27: 12:00:00 10 mL

## 2021-10-27 MED ORDER — KETOROLAC TROMETHAMINE 30 MG/ML IJ SOLN
INTRAMUSCULAR | Status: DC | PRN
  Administered 2021-10-27: 30 mg via INTRAVENOUS

## 2021-10-27 MED ORDER — PROPOFOL 10 MG/ML IV BOLUS
INTRAVENOUS | Status: DC | PRN
  Administered 2021-10-27: 150 mg via INTRAVENOUS

## 2021-10-27 MED ORDER — CEFAZOLIN SODIUM-DEXTROSE 2-4 GM/100ML-% IV SOLN
INTRAVENOUS | Status: AC
  Filled 2021-10-27: qty 100

## 2021-10-27 MED ORDER — DEXAMETHASONE SODIUM PHOSPHATE 10 MG/ML IJ SOLN
INTRAMUSCULAR | Status: AC
  Filled 2021-10-27: qty 1

## 2021-10-27 MED ORDER — FENTANYL CITRATE (PF) 100 MCG/2ML IJ SOLN
INTRAMUSCULAR | Status: DC | PRN
  Administered 2021-10-27 (×4): 25 ug via INTRAVENOUS

## 2021-10-27 MED ORDER — LIDOCAINE HCL (CARDIAC) PF 100 MG/5ML IV SOSY
PREFILLED_SYRINGE | INTRAVENOUS | Status: DC | PRN
  Administered 2021-10-27: 100 mg via INTRAVENOUS

## 2021-10-27 MED ORDER — LIDOCAINE 2% (20 MG/ML) 5 ML SYRINGE
INTRAMUSCULAR | Status: AC
  Filled 2021-10-27: qty 5

## 2021-10-27 MED ORDER — SCOPOLAMINE 1 MG/3DAYS TD PT72
1.0000 | MEDICATED_PATCH | TRANSDERMAL | Status: DC
  Administered 2021-10-27: 11:00:00 1.5 mg via TRANSDERMAL

## 2021-10-27 MED ORDER — ONDANSETRON HCL 4 MG/2ML IJ SOLN
INTRAMUSCULAR | Status: DC | PRN
  Administered 2021-10-27: 4 mg via INTRAVENOUS

## 2021-10-27 MED ORDER — MIDAZOLAM HCL 2 MG/2ML IJ SOLN
INTRAMUSCULAR | Status: AC
  Filled 2021-10-27: qty 2

## 2021-10-27 MED ORDER — ONDANSETRON HCL 4 MG/2ML IJ SOLN
INTRAMUSCULAR | Status: AC
  Filled 2021-10-27: qty 2

## 2021-10-27 MED ORDER — OXYCODONE HCL 5 MG PO TABS: 5.0000 mg | ORAL_TABLET | Freq: Three times a day (TID) | ORAL | 0 refills | Status: AC | PRN

## 2021-10-27 SURGICAL SUPPLY — 27 items
BAG DRAIN URO-CYSTO SKYTR STRL (DRAIN) ×4 IMPLANT
BAG DRN UROCATH (DRAIN) ×1
BASKET ZERO TIP NITINOL 2.4FR (BASKET) ×2 IMPLANT
BSKT STON RTRVL ZERO TP 2.4FR (BASKET) ×1
CATH URET 5FR 28IN OPEN ENDED (CATHETERS) ×4 IMPLANT
CLOTH BEACON ORANGE TIMEOUT ST (SAFETY) ×4 IMPLANT
COVER DOME SNAP 22 D (MISCELLANEOUS) ×2 IMPLANT
DRSG TEGADERM 4X4.75 (GAUZE/BANDAGES/DRESSINGS) IMPLANT
EXTRACTOR STONE 1.7FRX115CM (UROLOGICAL SUPPLIES) IMPLANT
GLOVE SURG ENC MOIS LTX SZ6.5 (GLOVE) ×4 IMPLANT
GLOVE SURG NEOP MICRO LF SZ6.5 (GLOVE) ×2 IMPLANT
GLOVE SURG NEOPR MICRO LF SZ7 (GLOVE) ×2 IMPLANT
GOWN STRL REUS W/ TWL LRG LVL3 (GOWN DISPOSABLE) IMPLANT
GOWN STRL REUS W/TWL LRG LVL3 (GOWN DISPOSABLE) ×7 IMPLANT
GUIDEWIRE STR DUAL SENSOR (WIRE) ×4 IMPLANT
IV NS IRRIG 3000ML ARTHROMATIC (IV SOLUTION) ×4 IMPLANT
KIT TURNOVER CYSTO (KITS) ×4 IMPLANT
MANIFOLD NEPTUNE II (INSTRUMENTS) ×4 IMPLANT
NS IRRIG 1000ML POUR BTL (IV SOLUTION) ×2 IMPLANT
PACK CYSTO (CUSTOM PROCEDURE TRAY) ×4 IMPLANT
SHEATH URETERAL 12FRX28CM (UROLOGICAL SUPPLIES) ×2 IMPLANT
STENT URET 6FRX24 CONTOUR (STENTS) ×2 IMPLANT
TRACTIP FLEXIVA PULS ID 200XHI (Laser) IMPLANT
TRACTIP FLEXIVA PULSE ID 200 (Laser) ×3
TUBE CONNECTING 12'X1/4 (SUCTIONS) ×1
TUBE CONNECTING 12X1/4 (SUCTIONS) ×3 IMPLANT
TUBING UROLOGY SET (TUBING) ×4 IMPLANT

## 2021-10-27 NOTE — Anesthesia Procedure Notes (Signed)
Procedure Name: LMA Insertion Date/Time: 10/27/2021 11:55 AM Performed by: Jessica Priest, CRNA Pre-anesthesia Checklist: Patient identified, Emergency Drugs available, Suction available, Patient being monitored and Timeout performed Patient Re-evaluated:Patient Re-evaluated prior to induction Oxygen Delivery Method: Circle system utilized Preoxygenation: Pre-oxygenation with 100% oxygen Induction Type: IV induction Ventilation: Mask ventilation without difficulty LMA: LMA inserted LMA Size: 3.0 Number of attempts: 1 Airway Equipment and Method: Bite block Placement Confirmation: positive ETCO2, breath sounds checked- equal and bilateral and CO2 detector Tube secured with: Tape Dental Injury: Teeth and Oropharynx as per pre-operative assessment

## 2021-10-27 NOTE — Interval H&P Note (Signed)
History and Physical Interval Note: Patient previously underwent ESWL but was unsuccessful.  She now presents for ureteroscopy.  The risks and benefits of the procedure have been discussed with the patient in detail.  10/27/2021 11:37 AM  Molly Beck  has presented today for surgery, with the diagnosis of LEFT URETERAL CALCULUS.  The various methods of treatment have been discussed with the patient and family. After consideration of risks, benefits and other options for treatment, the patient has consented to  Procedure(s) with comments: CYSTOSCOPY WITH RETROGRADE PYELOGRAM, URETEROSCOPY AND STENT PLACEMENT (Left) - 75 MINS HOLMIUM LASER APPLICATION (Left) as a surgical intervention.  The patient's history has been reviewed, patient examined, no change in status, stable for surgery.  I have reviewed the patient's chart and labs.  Questions were answered to the patient's satisfaction.     Jadan Rouillard D Griffon Herberg

## 2021-10-27 NOTE — Anesthesia Postprocedure Evaluation (Signed)
Anesthesia Post Note  Patient: Molly Beck  Procedure(s) Performed: CYSTOSCOPY WITH RETROGRADE PYELOGRAM, URETEROSCOPY AND STENT PLACEMENT (Left: Renal) HOLMIUM LASER APPLICATION (Left: Renal)     Patient location during evaluation: PACU Anesthesia Type: General Level of consciousness: awake and alert Pain management: pain level controlled Vital Signs Assessment: post-procedure vital signs reviewed and stable Respiratory status: spontaneous breathing, nonlabored ventilation, respiratory function stable and patient connected to nasal cannula oxygen Cardiovascular status: blood pressure returned to baseline and stable Postop Assessment: no apparent nausea or vomiting Anesthetic complications: no   No notable events documented.  Last Vitals:  Vitals:   10/27/21 1230 10/27/21 1245  BP: (!) 141/94 (!) 144/89  Pulse: 65 62  Resp: 13 11  Temp: (!) 36.3 C   SpO2: 100% 97%    Last Pain:  Vitals:   10/27/21 1226  TempSrc:   PainSc: 0-No pain                 Trevor Iha

## 2021-10-27 NOTE — Op Note (Signed)
Preoperative diagnosis: left ureteral calculus  Postoperative diagnosis: left ureteral calculus  Procedure:  Cystoscopy left ureteroscopy, laser lithotripsy, basket stone extraction left 26F x 24cm ureteral stent placement - with tether left retrograde pyelography with interpretation  Surgeon: Kasandra Knudsen, MD  Anesthesia: General  Complications: None  Intraoperative findings:  Normal urethra Bilateral orthotropic ureteral orifices Left retrograde pyelography demonstrated a filling defect within the left ureter consistent with the patients known calculus without other abnormalities. Bladder mucosa normal without masses   EBL: Minimal  Specimens: left ureteral calculus  Disposition of specimens: [facility] for stone analysis  Indication: Molly Beck is a 55 y.o.   patient with a 70mm left ureteral stone and associated left symptoms.  She previously underwent left ESWL which was on successful.  She has not passed the stone.  After reviewing the management options for treatment, the patient elected to proceed with the above surgical procedure(s). We have discussed the potential benefits and risks of the procedure, side effects of the proposed treatment, the likelihood of the patient achieving the goals of the procedure, and any potential problems that might occur during the procedure or recuperation. Informed consent has been obtained.   Description of procedure:  The patient was taken to the operating room and general anesthesia was induced.  The patient was placed in the dorsal lithotomy position, prepped and draped in the usual sterile fashion, and preoperative antibiotics were administered. A preoperative time-out was performed.   Cystourethroscopy was performed.  The patients urethra was examined and was normal.  The bladder was then systematically examined in its entirety. There was no evidence for any bladder tumors, stones, or other mucosal  pathology.    Attention then turned to the left ureteral orifice and a ureteral catheter was used to intubate the ureteral orifice.  Omnipaque contrast was injected through the ureteral catheter and a retrograde pyelogram was performed with findings as dictated above.  A 0.38 sensor guidewire was then advanced up the left ureter into the renal pelvis under fluoroscopic guidance through the open-ended ureteral catheter.  The catheter was then removed.. The 4.5 Fr semirigid ureteroscope was then advanced into the ureter next to the guidewire and the calculus was identified in the distal ureter.   The stone was then fragmented with the 242 micron holmium laser fiber. All stone fragments were then removed from the ureter with a 0 tip basket. Reinspection of the ureter revealed no remaining visible stones or fragments.   The wire was then backloaded through the cystoscope and a ureteral stent was advance over the wire using Seldinger technique.  The stent was positioned appropriately under fluoroscopic and cystoscopic guidance.  The wire was then removed with an adequate stent curl noted in the renal pelvis as well as in the bladder.  The bladder was then emptied and the procedure ended.  The patient appeared to tolerate the procedure well and without complications.  The patient was able to be awakened and transferred to the recovery unit in satisfactory condition.   Disposition: The tether of the stent was left on and tucked inside the patient's vagina.  Instructions for removing the stent have been provided to the patient.

## 2021-10-27 NOTE — Transfer of Care (Signed)
Immediate Anesthesia Transfer of Care Note  Patient: Molly Beck  Procedure(s) Performed: Procedure(s) (LRB): CYSTOSCOPY WITH RETROGRADE PYELOGRAM, URETEROSCOPY AND STENT PLACEMENT (Left) HOLMIUM LASER APPLICATION (Left)  Patient Location: PACU  Anesthesia Type: General  Level of Consciousness: awake, sedated, patient cooperative and responds to stimulation  Airway & Oxygen Therapy: Patient Spontanous Breathing and Patient connected to [place] 02 and soft FM   Post-op Assessment: Report given to PACU RN, Post -op Vital signs reviewed and stable and Patient moving all extremities  Post vital signs: Reviewed and stable  Complications: No apparent anesthesia complications

## 2021-10-27 NOTE — Discharge Instructions (Addendum)
DISCHARGE INSTRUCTIONS FOR KIDNEY STONE/URETERAL STENT   MEDICATIONS:  1. Resume all your other meds from home  2. AZO over the counter can help with the burning/stinging when you urinate. 3. Oxycodone is for moderate/severe pain, otherwise taking up to 1000 mg every 6 hours of plainTylenol will help treat your pain.   4. Take Cephalexin one hour prior to removal of your stent.    ACTIVITY:  1. No strenuous activity x 1week  2. No driving while on narcotic pain medications  3. Drink plenty of water  4. Continue to walk at home - you can still get blood clots when you are at home, so keep active, but don't over do it.  5. May return to work/school tomorrow or when you feel ready   BATHING:  1. You can shower and we recommend daily showers  2. You have a string coming from your urethra: The stent string is attached to your ureteral stent. Do not pull on this.   SIGNS/SYMPTOMS TO CALL:  Please call us if you have a fever greater than 101.5, uncontrolled nausea/vomiting, uncontrolled pain, dizziness, unable to urinate, bloody urine, chest pain, shortness of breath, leg swelling, leg pain, redness around wound, drainage from wound, or any other concerns or questions.   You can reach [place] at [phone].       Post Anesthesia Home Care Instructions  Activity: Get plenty of rest for the remainder of the day. A responsible individual must stay with you for 24 hours following the procedure.  For the next 24 hours, DO NOT: -Drive a car -Advertising copywriter -Drink alcoholic beverages -Take any medication unless instructed by your physician -Make any legal decisions or sign important papers.  Meals: Start with liquid foods such as gelatin or soup. Progress to regular foods as tolerated. Avoid greasy, spicy, heavy foods. If nausea and/or vomiting occur, drink only clear liquids until the nausea and/or vomiting subsides. Call your physician if vomiting continues.  Special  Instructions/Symptoms: Your throat may feel dry or sore from the anesthesia or the breathing tube placed in your throat during surgery. If this causes discomfort, gargle with warm salt water. The discomfort should disappear within 24 hours.  If you had a scopolamine patch placed behind your ear for the management of post- operative nausea and/or vomiting:  1. The medication in the patch is effective for 72 hours, after which it should be removed.  Wrap patch in a tissue and discard in the trash. Wash hands thoroughly with soap and water. 2. You may remove the patch earlier than 72 hours if you experience unpleasant side effects which may include dry mouth, dizziness or visual disturbances. 3. Avoid touching the patch. Wash your hands with soap and water after contact with the patch.      FOLLOW-UP:  1. You have a string attached to your stent, you may remove it on Friday, December 30. To do this, pull the strings until the stent is completely removed. You may feel an odd sensation in your back.

## 2021-10-28 ENCOUNTER — Encounter (HOSPITAL_BASED_OUTPATIENT_CLINIC_OR_DEPARTMENT_OTHER): Payer: Self-pay | Admitting: Urology

## 2021-10-28 DIAGNOSIS — N201 Calculus of ureter: Secondary | ICD-10-CM | POA: Diagnosis not present

## 2021-11-06 DIAGNOSIS — I878 Other specified disorders of veins: Secondary | ICD-10-CM | POA: Diagnosis not present

## 2021-11-06 DIAGNOSIS — K429 Umbilical hernia without obstruction or gangrene: Secondary | ICD-10-CM | POA: Diagnosis not present

## 2021-11-06 DIAGNOSIS — N200 Calculus of kidney: Secondary | ICD-10-CM | POA: Diagnosis not present

## 2021-11-06 DIAGNOSIS — N132 Hydronephrosis with renal and ureteral calculous obstruction: Secondary | ICD-10-CM | POA: Diagnosis not present

## 2021-11-25 DIAGNOSIS — N132 Hydronephrosis with renal and ureteral calculous obstruction: Secondary | ICD-10-CM | POA: Diagnosis not present

## 2021-11-26 DIAGNOSIS — G894 Chronic pain syndrome: Secondary | ICD-10-CM | POA: Diagnosis not present

## 2021-11-26 DIAGNOSIS — Z5181 Encounter for therapeutic drug level monitoring: Secondary | ICD-10-CM | POA: Diagnosis not present

## 2021-11-26 DIAGNOSIS — Z79899 Other long term (current) drug therapy: Secondary | ICD-10-CM | POA: Diagnosis not present

## 2022-02-10 DIAGNOSIS — Z6829 Body mass index (BMI) 29.0-29.9, adult: Secondary | ICD-10-CM | POA: Diagnosis not present

## 2022-02-10 DIAGNOSIS — Z01419 Encounter for gynecological examination (general) (routine) without abnormal findings: Secondary | ICD-10-CM | POA: Diagnosis not present

## 2022-02-10 DIAGNOSIS — Z1231 Encounter for screening mammogram for malignant neoplasm of breast: Secondary | ICD-10-CM | POA: Diagnosis not present

## 2022-02-10 DIAGNOSIS — Z13820 Encounter for screening for osteoporosis: Secondary | ICD-10-CM | POA: Diagnosis not present

## 2022-02-10 DIAGNOSIS — Z13220 Encounter for screening for lipoid disorders: Secondary | ICD-10-CM | POA: Diagnosis not present

## 2022-02-10 DIAGNOSIS — Z1151 Encounter for screening for human papillomavirus (HPV): Secondary | ICD-10-CM | POA: Diagnosis not present

## 2022-02-10 DIAGNOSIS — Z13228 Encounter for screening for other metabolic disorders: Secondary | ICD-10-CM | POA: Diagnosis not present

## 2022-02-10 DIAGNOSIS — Z124 Encounter for screening for malignant neoplasm of cervix: Secondary | ICD-10-CM | POA: Diagnosis not present

## 2022-02-10 DIAGNOSIS — Z131 Encounter for screening for diabetes mellitus: Secondary | ICD-10-CM | POA: Diagnosis not present

## 2022-02-16 DIAGNOSIS — D649 Anemia, unspecified: Secondary | ICD-10-CM | POA: Diagnosis not present

## 2022-04-09 DIAGNOSIS — M1611 Unilateral primary osteoarthritis, right hip: Secondary | ICD-10-CM | POA: Diagnosis not present

## 2022-04-09 DIAGNOSIS — Z96642 Presence of left artificial hip joint: Secondary | ICD-10-CM | POA: Diagnosis not present

## 2022-04-12 ENCOUNTER — Other Ambulatory Visit: Payer: Self-pay | Admitting: Student

## 2022-04-12 DIAGNOSIS — M25551 Pain in right hip: Secondary | ICD-10-CM

## 2022-05-03 DIAGNOSIS — G43909 Migraine, unspecified, not intractable, without status migrainosus: Secondary | ICD-10-CM | POA: Diagnosis not present

## 2022-05-03 DIAGNOSIS — M1611 Unilateral primary osteoarthritis, right hip: Secondary | ICD-10-CM | POA: Diagnosis not present

## 2022-05-03 DIAGNOSIS — Z01818 Encounter for other preprocedural examination: Secondary | ICD-10-CM | POA: Diagnosis not present

## 2022-05-03 DIAGNOSIS — J309 Allergic rhinitis, unspecified: Secondary | ICD-10-CM | POA: Diagnosis not present

## 2022-05-23 DIAGNOSIS — M5136 Other intervertebral disc degeneration, lumbar region: Secondary | ICD-10-CM | POA: Diagnosis not present

## 2022-05-23 DIAGNOSIS — M545 Low back pain, unspecified: Secondary | ICD-10-CM | POA: Diagnosis not present

## 2022-05-24 DIAGNOSIS — N200 Calculus of kidney: Secondary | ICD-10-CM | POA: Diagnosis not present

## 2022-05-31 DIAGNOSIS — M1611 Unilateral primary osteoarthritis, right hip: Secondary | ICD-10-CM | POA: Diagnosis not present

## 2022-06-17 DIAGNOSIS — H04123 Dry eye syndrome of bilateral lacrimal glands: Secondary | ICD-10-CM | POA: Diagnosis not present

## 2022-06-17 DIAGNOSIS — H5213 Myopia, bilateral: Secondary | ICD-10-CM | POA: Diagnosis not present

## 2022-07-14 DIAGNOSIS — M170 Bilateral primary osteoarthritis of knee: Secondary | ICD-10-CM | POA: Diagnosis not present

## 2022-07-14 DIAGNOSIS — M25561 Pain in right knee: Secondary | ICD-10-CM | POA: Diagnosis not present

## 2022-07-14 DIAGNOSIS — M25562 Pain in left knee: Secondary | ICD-10-CM | POA: Diagnosis not present

## 2022-08-26 DIAGNOSIS — M47816 Spondylosis without myelopathy or radiculopathy, lumbar region: Secondary | ICD-10-CM | POA: Diagnosis not present

## 2022-09-22 DIAGNOSIS — M47896 Other spondylosis, lumbar region: Secondary | ICD-10-CM | POA: Diagnosis not present

## 2022-09-22 DIAGNOSIS — M545 Low back pain, unspecified: Secondary | ICD-10-CM | POA: Diagnosis not present

## 2022-09-22 DIAGNOSIS — M5136 Other intervertebral disc degeneration, lumbar region: Secondary | ICD-10-CM | POA: Diagnosis not present

## 2022-10-28 DIAGNOSIS — R051 Acute cough: Secondary | ICD-10-CM | POA: Diagnosis not present

## 2022-10-28 DIAGNOSIS — R0981 Nasal congestion: Secondary | ICD-10-CM | POA: Diagnosis not present

## 2022-10-28 DIAGNOSIS — J0100 Acute maxillary sinusitis, unspecified: Secondary | ICD-10-CM | POA: Diagnosis not present

## 2023-01-14 DIAGNOSIS — E663 Overweight: Secondary | ICD-10-CM | POA: Diagnosis not present

## 2023-01-14 DIAGNOSIS — Z96643 Presence of artificial hip joint, bilateral: Secondary | ICD-10-CM | POA: Diagnosis not present

## 2023-01-20 DIAGNOSIS — Z5181 Encounter for therapeutic drug level monitoring: Secondary | ICD-10-CM | POA: Diagnosis not present

## 2023-01-20 DIAGNOSIS — Z79899 Other long term (current) drug therapy: Secondary | ICD-10-CM | POA: Diagnosis not present

## 2023-01-20 DIAGNOSIS — G894 Chronic pain syndrome: Secondary | ICD-10-CM | POA: Diagnosis not present

## 2023-01-20 DIAGNOSIS — M542 Cervicalgia: Secondary | ICD-10-CM | POA: Diagnosis not present

## 2023-02-24 DIAGNOSIS — M2550 Pain in unspecified joint: Secondary | ICD-10-CM | POA: Diagnosis not present

## 2023-02-24 DIAGNOSIS — Z23 Encounter for immunization: Secondary | ICD-10-CM | POA: Diagnosis not present

## 2023-02-24 DIAGNOSIS — J309 Allergic rhinitis, unspecified: Secondary | ICD-10-CM | POA: Diagnosis not present

## 2023-02-24 DIAGNOSIS — G43909 Migraine, unspecified, not intractable, without status migrainosus: Secondary | ICD-10-CM | POA: Diagnosis not present

## 2023-02-24 DIAGNOSIS — Z13220 Encounter for screening for lipoid disorders: Secondary | ICD-10-CM | POA: Diagnosis not present

## 2023-02-24 DIAGNOSIS — R030 Elevated blood-pressure reading, without diagnosis of hypertension: Secondary | ICD-10-CM | POA: Diagnosis not present

## 2023-02-24 DIAGNOSIS — Z0000 Encounter for general adult medical examination without abnormal findings: Secondary | ICD-10-CM | POA: Diagnosis not present

## 2023-05-23 DIAGNOSIS — M25561 Pain in right knee: Secondary | ICD-10-CM | POA: Diagnosis not present

## 2023-05-23 DIAGNOSIS — M47896 Other spondylosis, lumbar region: Secondary | ICD-10-CM | POA: Diagnosis not present

## 2023-05-23 DIAGNOSIS — G894 Chronic pain syndrome: Secondary | ICD-10-CM | POA: Diagnosis not present

## 2023-08-22 DIAGNOSIS — Z6823 Body mass index (BMI) 23.0-23.9, adult: Secondary | ICD-10-CM | POA: Diagnosis not present

## 2023-08-22 DIAGNOSIS — R051 Acute cough: Secondary | ICD-10-CM | POA: Diagnosis not present

## 2023-08-22 DIAGNOSIS — J0140 Acute pansinusitis, unspecified: Secondary | ICD-10-CM | POA: Diagnosis not present

## 2023-09-01 DIAGNOSIS — Z01419 Encounter for gynecological examination (general) (routine) without abnormal findings: Secondary | ICD-10-CM | POA: Diagnosis not present

## 2023-09-01 DIAGNOSIS — Z6824 Body mass index (BMI) 24.0-24.9, adult: Secondary | ICD-10-CM | POA: Diagnosis not present

## 2023-09-01 DIAGNOSIS — Z1231 Encounter for screening mammogram for malignant neoplasm of breast: Secondary | ICD-10-CM | POA: Diagnosis not present

## 2023-09-26 DIAGNOSIS — M47816 Spondylosis without myelopathy or radiculopathy, lumbar region: Secondary | ICD-10-CM | POA: Diagnosis not present

## 2023-09-26 DIAGNOSIS — M51362 Other intervertebral disc degeneration, lumbar region with discogenic back pain and lower extremity pain: Secondary | ICD-10-CM | POA: Diagnosis not present

## 2023-09-26 DIAGNOSIS — G894 Chronic pain syndrome: Secondary | ICD-10-CM | POA: Diagnosis not present

## 2023-11-02 DIAGNOSIS — B379 Candidiasis, unspecified: Secondary | ICD-10-CM | POA: Diagnosis not present

## 2023-11-02 DIAGNOSIS — T3695XA Adverse effect of unspecified systemic antibiotic, initial encounter: Secondary | ICD-10-CM | POA: Diagnosis not present

## 2023-11-02 DIAGNOSIS — J0141 Acute recurrent pansinusitis: Secondary | ICD-10-CM | POA: Diagnosis not present

## 2023-11-02 DIAGNOSIS — Z6821 Body mass index (BMI) 21.0-21.9, adult: Secondary | ICD-10-CM | POA: Diagnosis not present

## 2024-01-24 DIAGNOSIS — M47816 Spondylosis without myelopathy or radiculopathy, lumbar region: Secondary | ICD-10-CM | POA: Diagnosis not present

## 2024-01-24 DIAGNOSIS — Z79899 Other long term (current) drug therapy: Secondary | ICD-10-CM | POA: Diagnosis not present

## 2024-01-24 DIAGNOSIS — M51362 Other intervertebral disc degeneration, lumbar region with discogenic back pain and lower extremity pain: Secondary | ICD-10-CM | POA: Diagnosis not present

## 2024-01-24 DIAGNOSIS — Z5181 Encounter for therapeutic drug level monitoring: Secondary | ICD-10-CM | POA: Diagnosis not present

## 2024-01-24 DIAGNOSIS — M545 Low back pain, unspecified: Secondary | ICD-10-CM | POA: Diagnosis not present

## 2024-01-24 DIAGNOSIS — G894 Chronic pain syndrome: Secondary | ICD-10-CM | POA: Diagnosis not present

## 2024-03-07 DIAGNOSIS — G43909 Migraine, unspecified, not intractable, without status migrainosus: Secondary | ICD-10-CM | POA: Diagnosis not present

## 2024-03-07 DIAGNOSIS — D509 Iron deficiency anemia, unspecified: Secondary | ICD-10-CM | POA: Diagnosis not present

## 2024-03-07 DIAGNOSIS — Z23 Encounter for immunization: Secondary | ICD-10-CM | POA: Diagnosis not present

## 2024-03-07 DIAGNOSIS — Z713 Dietary counseling and surveillance: Secondary | ICD-10-CM | POA: Diagnosis not present

## 2024-03-07 DIAGNOSIS — Z13220 Encounter for screening for lipoid disorders: Secondary | ICD-10-CM | POA: Diagnosis not present

## 2024-03-07 DIAGNOSIS — Z0000 Encounter for general adult medical examination without abnormal findings: Secondary | ICD-10-CM | POA: Diagnosis not present

## 2024-03-07 DIAGNOSIS — Z6823 Body mass index (BMI) 23.0-23.9, adult: Secondary | ICD-10-CM | POA: Diagnosis not present

## 2024-03-07 DIAGNOSIS — E6609 Other obesity due to excess calories: Secondary | ICD-10-CM | POA: Diagnosis not present

## 2024-04-02 DIAGNOSIS — R8271 Bacteriuria: Secondary | ICD-10-CM | POA: Diagnosis not present

## 2024-04-02 DIAGNOSIS — N200 Calculus of kidney: Secondary | ICD-10-CM | POA: Diagnosis not present

## 2024-04-30 DIAGNOSIS — M5416 Radiculopathy, lumbar region: Secondary | ICD-10-CM | POA: Diagnosis not present

## 2024-05-21 DIAGNOSIS — M545 Low back pain, unspecified: Secondary | ICD-10-CM | POA: Diagnosis not present

## 2024-09-10 DIAGNOSIS — K5903 Drug induced constipation: Secondary | ICD-10-CM | POA: Diagnosis not present

## 2024-09-10 DIAGNOSIS — G8929 Other chronic pain: Secondary | ICD-10-CM | POA: Diagnosis not present
